# Patient Record
Sex: Female | Born: 1965 | Race: Black or African American | Hispanic: No
Health system: Southern US, Community
[De-identification: ages and names within clinical notes are randomized; demographics above are authoritative.]

## PROBLEM LIST (undated history)

## (undated) DIAGNOSIS — E049 Nontoxic goiter, unspecified: Secondary | ICD-10-CM

## (undated) DIAGNOSIS — F32A Depression, unspecified: Secondary | ICD-10-CM

## (undated) DIAGNOSIS — Z9221 Personal history of antineoplastic chemotherapy: Secondary | ICD-10-CM

## (undated) DIAGNOSIS — M1990 Unspecified osteoarthritis, unspecified site: Secondary | ICD-10-CM

## (undated) DIAGNOSIS — Z923 Personal history of irradiation: Secondary | ICD-10-CM

## (undated) DIAGNOSIS — Z803 Family history of malignant neoplasm of breast: Secondary | ICD-10-CM

## (undated) DIAGNOSIS — C50919 Malignant neoplasm of unspecified site of unspecified female breast: Secondary | ICD-10-CM

## (undated) HISTORY — DX: Nontoxic goiter, unspecified: E04.9

## (undated) HISTORY — DX: Depression, unspecified: F32.A

## (undated) HISTORY — DX: Family history of malignant neoplasm of breast: Z80.3

## (undated) HISTORY — PX: TOOTH EXTRACTION: SUR596

## (undated) HISTORY — PX: KNEE ARTHROSCOPY: SUR90

---

## 2006-12-19 HISTORY — PX: KNEE ARTHROSCOPY: SUR90

## 2008-12-19 HISTORY — PX: OTHER SURGICAL HISTORY: SHX169

## 2017-11-06 DIAGNOSIS — M170 Bilateral primary osteoarthritis of knee: Secondary | ICD-10-CM | POA: Insufficient documentation

## 2020-12-19 DIAGNOSIS — U071 COVID-19: Secondary | ICD-10-CM

## 2020-12-19 HISTORY — DX: COVID-19: U07.1

## 2021-04-07 DIAGNOSIS — C50912 Malignant neoplasm of unspecified site of left female breast: Secondary | ICD-10-CM

## 2021-04-07 HISTORY — DX: Malignant neoplasm of unspecified site of left female breast: C50.912

## 2021-04-07 HISTORY — PX: BREAST BIOPSY: SHX20

## 2021-04-20 ENCOUNTER — Other Ambulatory Visit: Payer: Self-pay | Admitting: General Surgery

## 2021-04-20 DIAGNOSIS — Z171 Estrogen receptor negative status [ER-]: Secondary | ICD-10-CM

## 2021-04-20 DIAGNOSIS — C50412 Malignant neoplasm of upper-outer quadrant of left female breast: Secondary | ICD-10-CM

## 2021-04-21 ENCOUNTER — Encounter: Payer: Self-pay | Admitting: Nurse Practitioner

## 2021-04-21 DIAGNOSIS — C50412 Malignant neoplasm of upper-outer quadrant of left female breast: Secondary | ICD-10-CM | POA: Insufficient documentation

## 2021-04-21 DIAGNOSIS — Z171 Estrogen receptor negative status [ER-]: Secondary | ICD-10-CM | POA: Insufficient documentation

## 2021-04-21 DIAGNOSIS — C50919 Malignant neoplasm of unspecified site of unspecified female breast: Secondary | ICD-10-CM | POA: Insufficient documentation

## 2021-04-21 NOTE — Progress Notes (Deleted)
one [place] NOTE  No care team member to display  CHIEF COMPLAINTS/PURPOSE OF CONSULTATION: Breast cancer  #  Oncology History   No history exists.     HISTORY OF PRESENTING ILLNESS:  Peggy Bates 55 y.o.  female female with no prior history of breast cancer/or malignancies has been referred to [place] for further evaluation recommendations for new diagnosis of breast cancer.   Patient states she was found to have an abnormal screening mammogram which led to diagnostic mammogram/ultrasound/followed by biopsy-as summarized above.  This a new patient is here today for: office visit. She is here for evaluation of newly diagnosis of left breast cancer referred by Angelina Ok NP.  Chest:  Breasts:  Right: Normal. No axillary adenopathy or supraclavicular adenopathy.  Left: Mass (2 cm palpable mass 1:30 o'clock, 9 cfn. Non-tender) present. No swelling, skin change, axillary adenopathy or supraclavicular adenopathy.   Musculoskeletal:  Cervical back: Neck supple.  Lymphadenopathy:  Upper Body:  Right upper body: No supraclavicular or axillary adenopathy.  Left upper body: No supraclavicular or axillary adenopathy.  Skin: General: Skin is warm and dry.  Neurological:  Mental Status: She is alert and oriented to person, place, and time.  Psychiatric:  Mood and Affect: Mood normal.  Behavior: Behavior normal.   Labs and Radiology:   Pathology dated April 07, 2021 from [facility] in [place]:  Ultrasound-guided core biopsy. Nuclear grade 3, high-grade. P53: 70% staining. Ki-67: 70% staining.  ER negative, PR negative, HER2 negative. Mitotic rate of 13 mitoses per high-power field.  The patient brought a copy of her imaging studies with her from [place] and these have been independently reviewed.  Screening mammogram dated March 30, 2021 showed an asymmetric nodular density in the superior lateral aspect of the left breast. The right breast was  unremarkable BI-RADS-0.  Ultrasound examination of the left breast showed a 1.1 cm round, nonparallel hypoechoic mass with angular margins. BI-RADS-4.  The biopsy was on 04-07-21 in [place]. She states she does self breast exams and could not feel anything different in the breast prior to her annual mammogram.  The patient had a close friend who recently went through a bout with an aggressive breast cancer. That individual was treated at [facility] and the patient was with her during most of her 2-year treatment course which ended in the friend's death. She is unfortunately very familiar with all the can go along with breast cancer including chemotherapy, access ports and hair loss.  The patient had 3 children, 1 died in adulthood and her sleep.  The patient does not smoke or drink.  She is here with her husband, Izell [place].    Family history of breast cancer:  Family history of other cancers:  Menarche: year old Menopause: years old Used OCP:  Used estrogen and progesterone therapy:  History of Radiation to the chest:  Number of pregnancies: Previous biopsy:   # I had a long discussion with the patient in general regarding the treatment options of breast cancer including-surgery; adjuvant radiation; role of adjuvant systemic therapy including-chemotherapy antihormone therapy. Patient will likely need lumpectomy with sentinel lymph node evaluation; followed by radiation. Decision regarding chemotherapy based on final surgical pathology/gene assay. Patient will benefit from antihormone therapy.  I discussed the potential benefits of each option; and also potential downsides in detail.    ROS   MEDICAL HISTORY:  No past medical history on file.  SURGICAL HISTORY: *** The histories are not reviewed yet. Please  review them in the "History" navigator section and refresh this [place].  SOCIAL HISTORY: Social History   Socioeconomic History  . Marital status: Unknown     Spouse name: Not on file  . Number of children: Not on file  . Years of education: Not on file  . Highest education level: Not on file  Occupational History  . Not on file  Tobacco Use  . Smoking status: Not on file  . Smokeless tobacco: Not on file  Substance and Sexual Activity  . Alcohol use: Not on file  . Drug use: Not on file  . Sexual activity: Not on file  Other Topics Concern  . Not on file  Social History Narrative  . Not on file   Social Determinants of Health   Financial Resource Strain: Not on file  Food Insecurity: Not on file  Transportation Needs: Not on file  Physical Activity: Not on file  Stress: Not on file  Social Connections: Not on file  Intimate Partner Violence: Not on file    FAMILY HISTORY: No family history on file.  ALLERGIES:  has no allergies on file.  MEDICATIONS:  No current outpatient medications on file.   No current facility-administered medications for this visit.      Marland Kitchen  PHYSICAL EXAMINATION: ECOG PERFORMANCE STATUS: {CHL ONC ECOG [phone]}  There were no vitals filed for this visit. There were no vitals filed for this visit.  Physical Exam   LABORATORY DATA:  I have reviewed the data as listed No results found for: WBC, HGB, HCT, MCV, PLT No results for input(s): NA, K, CL, CO2, GLUCOSE, BUN, CREATININE, CALCIUM, GFRNONAA, GFRAA, PROT, ALBUMIN, AST, ALT, ALKPHOS, BILITOT, BILIDIR, IBILI in the last 8760 hours.  RADIOGRAPHIC STUDIES: I have personally reviewed the radiological images as listed and agreed with the findings in the report. No results found.  ASSESSMENT & PLAN:   No problem-specific Assessment & Plan notes found for this encounter.    All questions were answered. The patient/family knows to call the clinic with any problems, questions or concerns.       Cammie Sickle, MD 04/21/2021 8:28 PM

## 2021-04-22 ENCOUNTER — Other Ambulatory Visit: Payer: Self-pay | Admitting: *Deleted

## 2021-04-22 ENCOUNTER — Ambulatory Visit: Payer: Self-pay | Admitting: Internal Medicine

## 2021-04-22 ENCOUNTER — Encounter: Payer: Self-pay | Admitting: *Deleted

## 2021-04-22 ENCOUNTER — Other Ambulatory Visit: Payer: Self-pay

## 2021-04-23 ENCOUNTER — Inpatient Hospital Stay: Attending: Internal Medicine | Admitting: Internal Medicine

## 2021-04-23 ENCOUNTER — Inpatient Hospital Stay

## 2021-04-23 DIAGNOSIS — C50412 Malignant neoplasm of upper-outer quadrant of left female breast: Secondary | ICD-10-CM

## 2021-04-23 DIAGNOSIS — Z171 Estrogen receptor negative status [ER-]: Secondary | ICD-10-CM | POA: Insufficient documentation

## 2021-04-23 DIAGNOSIS — Z803 Family history of malignant neoplasm of breast: Secondary | ICD-10-CM | POA: Insufficient documentation

## 2021-04-23 NOTE — Assessment & Plan Note (Deleted)
#    #    #   DISPOSITION: # labs today # MUGA scan # port placement- [email or web address] # chemo education [AC-Taxol-carbo] #

## 2021-04-27 ENCOUNTER — Telehealth: Payer: Self-pay | Admitting: Internal Medicine

## 2021-04-27 ENCOUNTER — Encounter: Payer: Self-pay | Admitting: *Deleted

## 2021-04-27 ENCOUNTER — Ambulatory Visit: Payer: Self-pay | Admitting: Internal Medicine

## 2021-04-27 ENCOUNTER — Other Ambulatory Visit: Payer: Self-pay

## 2021-04-27 DIAGNOSIS — E559 Vitamin D deficiency, unspecified: Secondary | ICD-10-CM | POA: Insufficient documentation

## 2021-04-27 DIAGNOSIS — F329 Major depressive disorder, single episode, unspecified: Secondary | ICD-10-CM | POA: Insufficient documentation

## 2021-04-27 DIAGNOSIS — Z6832 Body mass index (BMI) 32.0-32.9, adult: Secondary | ICD-10-CM | POA: Insufficient documentation

## 2021-04-27 DIAGNOSIS — E049 Nontoxic goiter, unspecified: Secondary | ICD-10-CM | POA: Insufficient documentation

## 2021-04-27 DIAGNOSIS — F419 Anxiety disorder, unspecified: Secondary | ICD-10-CM | POA: Insufficient documentation

## 2021-04-27 NOTE — Telephone Encounter (Signed)
Patient referred for consideration of neoadjuvant chemotherapy. Patient missed her appointment on 05/09.  Patient will be scheduled with other providers.  Discussed with Dr. Bary Castilla.  GB

## 2021-04-29 ENCOUNTER — Telehealth: Payer: Self-pay

## 2021-04-29 ENCOUNTER — Inpatient Hospital Stay (HOSPITAL_BASED_OUTPATIENT_CLINIC_OR_DEPARTMENT_OTHER): Admitting: Oncology

## 2021-04-29 ENCOUNTER — Inpatient Hospital Stay

## 2021-04-29 ENCOUNTER — Encounter: Payer: Self-pay | Admitting: Oncology

## 2021-04-29 VITALS — BP 126/87 | HR 78 | Temp 98.2°F | Resp 18 | Ht 67.0 in | Wt 207.4 lb

## 2021-04-29 DIAGNOSIS — Z803 Family history of malignant neoplasm of breast: Secondary | ICD-10-CM

## 2021-04-29 DIAGNOSIS — C50412 Malignant neoplasm of upper-outer quadrant of left female breast: Secondary | ICD-10-CM

## 2021-04-29 DIAGNOSIS — Z7189 Other specified counseling: Secondary | ICD-10-CM | POA: Insufficient documentation

## 2021-04-29 DIAGNOSIS — Z171 Estrogen receptor negative status [ER-]: Secondary | ICD-10-CM

## 2021-04-29 LAB — CBC WITH DIFFERENTIAL/PLATELET
Abs Immature Granulocytes: 0.01 10*3/uL (ref 0.00–0.07)
Basophils Absolute: 0 10*3/uL (ref 0.0–0.1)
Basophils Relative: 0 %
Eosinophils Absolute: 0 10*3/uL (ref 0.0–0.5)
Eosinophils Relative: 1 %
HCT: 39.6 % (ref 36.0–46.0)
Hemoglobin: 12.7 g/dL (ref 12.0–15.0)
Immature Granulocytes: 0 %
Lymphocytes Relative: 44 %
Lymphs Abs: 1.5 10*3/uL (ref 0.7–4.0)
MCH: 27.7 pg (ref 26.0–34.0)
MCHC: 32.1 g/dL (ref 30.0–36.0)
MCV: 86.3 fL (ref 80.0–100.0)
Monocytes Absolute: 0.3 10*3/uL (ref 0.1–1.0)
Monocytes Relative: 10 %
Neutro Abs: 1.5 10*3/uL — ABNORMAL LOW (ref 1.7–7.7)
Neutrophils Relative %: 45 %
Platelets: 237 10*3/uL (ref 150–400)
RBC: 4.59 MIL/uL (ref 3.87–5.11)
RDW: 13.8 % (ref 11.5–15.5)
Smear Review: NORMAL
WBC: 3.4 10*3/uL — ABNORMAL LOW (ref 4.0–10.5)
nRBC: 0 % (ref 0.0–0.2)

## 2021-04-29 LAB — COMPREHENSIVE METABOLIC PANEL
ALT: 39 U/L (ref 0–44)
AST: 34 U/L (ref 15–41)
Albumin: 4.6 g/dL (ref 3.5–5.0)
Alkaline Phosphatase: 73 U/L (ref 38–126)
Anion gap: 5 (ref 5–15)
BUN: 18 mg/dL (ref 6–20)
CO2: 30 mmol/L (ref 22–32)
Calcium: 9.6 mg/dL (ref 8.9–10.3)
Chloride: 104 mmol/L (ref 98–111)
Creatinine, Ser: 0.69 mg/dL (ref 0.44–1.00)
GFR, Estimated: >60 mL/min (ref >60)
Glucose, Bld: 96 mg/dL (ref 70–99)
Potassium: 4.8 mmol/L (ref 3.5–5.1)
Sodium: 139 mmol/L (ref 135–145)
Total Bilirubin: 0.7 mg/dL (ref 0.3–1.2)
Total Protein: 8 g/dL (ref 6.5–8.1)

## 2021-04-29 MED ORDER — LIDOCAINE-PRILOCAINE 2.5-2.5 % EX CREA: TOPICAL_CREAM | CUTANEOUS | 3 refills | Status: DC

## 2021-04-29 MED ORDER — ONDANSETRON HCL 8 MG PO TABS: 8.0000 mg | ORAL_TABLET | Freq: Two times a day (BID) | ORAL | 1 refills | Status: DC | PRN

## 2021-04-29 MED ORDER — PROCHLORPERAZINE MALEATE 10 MG PO TABS: 10.0000 mg | ORAL_TABLET | Freq: Four times a day (QID) | ORAL | 1 refills | Status: DC | PRN

## 2021-04-29 MED ORDER — DEXAMETHASONE 4 MG PO TABS: 8.0000 mg | ORAL_TABLET | Freq: Every day | ORAL | 1 refills | Status: DC

## 2021-04-29 NOTE — Telephone Encounter (Signed)
Request for path slides has been faxed to [facility] pathology,  on breast biopsy specimen ID: [id number].   Slide consult order entered.   [place] phone: [phone] fax: [phone]

## 2021-04-29 NOTE — Progress Notes (Signed)
Patient here to establish care  

## 2021-04-29 NOTE — Progress Notes (Signed)
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  female with PMH listed below was seen in consultation at the request of  Byrnett, Forest Gleason, MD  for evaluation of triple negative breast cancer.  03/30/2021, screening mammogram with 3D 2 nodular areas of asymmetric density demonstrated within the superior lateral aspect of the left breast middle third depth.  Finding was best appreciated on 3D tomosynthesis imaging. Targeted ultrasound showed left upper outer breast 1:30 position 1.1 cm round hypoechoic mass with angular margins. 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 Bary Castilla yesterday.  She presents to establish care with me today Patient did not notice this left a mass prior to the mammogram.   Family history of breast cancer: Breast cancer in her sister, her late 83s, early 58s; and being paternal grandmother Menarche: 25 Age at first live childbirth: 63  patient has 2 sons and 1 daughter.  Daughter has passed away Used OCP:  Used estrogen and progesterone therapy: Denies History of Radiation to the chest: Denies Previous of breast biopsy: Denies   Review of Systems  Constitutional: Negative for appetite change, chills, fatigue and fever.  HENT:   Negative for hearing loss and voice change.   Eyes: Negative for eye problems.  Respiratory: Negative for chest tightness and cough.   Cardiovascular: Negative for chest pain.  Gastrointestinal: Negative for abdominal distention, abdominal pain and blood in stool.   Endocrine: Negative for hot flashes.  Genitourinary: Negative for difficulty urinating and frequency.   Musculoskeletal: Negative for arthralgias.  Skin: Negative for itching and rash.  Neurological: Negative for extremity weakness.  Hematological: Negative for adenopathy.  Psychiatric/Behavioral: Negative for confusion.    MEDICAL HISTORY:  Past Medical History:  Diagnosis Date  . Depression   . Goiter   . Malignant neoplasm of left female breast ([place]) 04/07/2021    SURGICAL HISTORY: Past Surgical History:  Procedure Laterality Date  . KNEE ARTHROSCOPY    . TOOTH EXTRACTION    . TRANSCERVICAL UTERINE FIBROID(S) ABLATION  2010    SOCIAL HISTORY: Social History   Socioeconomic History  . Marital status: Married    Spouse name: Not on file  . Number of children: Not on file  . Years of education: Not on file  . Highest education level: Not on file  Occupational History  . Not on file  Tobacco Use  . Smoking status: Never Smoker  . Smokeless tobacco: Never Used  Vaping Use  . Vaping Use: Never used  Substance and Sexual Activity  . Alcohol use: Never  . Drug use: Never  . Sexual activity: Yes  Other Topics Concern  . Not on file  Social History Narrative  . Not on file   Social Determinants of Health   Financial Resource Strain: Not on file  Food Insecurity: Not on file  Transportation Needs: Not on file  Physical Activity: Not on file  Stress: Not on file  Social Connections: Not on file  Intimate Partner Violence: Not on file    FAMILY HISTORY: Family History  Problem Relation Age of Onset  .  Breast cancer Paternal Grandmother   . Breast cancer Sister   . Diabetes Sister   . Diabetes Father     ALLERGIES:  has no allergies on file.  MEDICATIONS:  Current Outpatient Medications  Medication Sig Dispense Refill  . cetirizine (ZYRTEC) 10 MG tablet Take 1 tablet by mouth daily.    . cholecalciferol (VITAMIN D) 25 MCG (1000 UNIT) tablet Take  1,000 Units by mouth daily.    . diclofenac Sodium (VOLTAREN) 1 % GEL Apply 1 application topically 3 (three) times daily.    . Flaxseed, Linseed, (FLAX SEED OIL) 1000 MG CAPS See admin instructions.    . gabapentin (NEURONTIN) 600 MG tablet Take 1 tablet by mouth daily.    . meloxicam (MOBIC) 15 MG tablet Take 1 tablet by mouth daily.    . Multiple Vitamins-Calcium (ONE-A-DAY WOMENS FORMULA PO) Take by mouth.    . Omega 3-6-9 Fatty Acids (OMEGA 3-6-9 COMPLEX) CAPS See admin instructions.    . phentermine (ADIPEX-P) 37.5 MG tablet Take 37.5 mg by mouth daily.     No current facility-administered medications for this visit.     PHYSICAL EXAMINATION: ECOG PERFORMANCE STATUS: 0 - Asymptomatic Vitals:   04/29/21 1344  BP: 126/87  Pulse: 78  Resp: 18  Temp: 98.2 F (36.8 C)   Filed Weights   04/29/21 1344  Weight: 207 lb 6.4 oz (94.1 kg)    Physical Exam Constitutional:      General: She is not in acute distress. HENT:     Head: Normocephalic and atraumatic.  Eyes:     General: No scleral icterus. Cardiovascular:     Rate and Rhythm: Normal rate and regular rhythm.     Heart sounds: Normal heart sounds.  Pulmonary:     Effort: Pulmonary effort is normal. No respiratory distress.     Breath sounds: No wheezing.  Abdominal:     General: Bowel sounds are normal. There is no distension.     Palpations: Abdomen is soft.  Musculoskeletal:        General: No deformity. Normal range of motion.     Cervical back: Normal range of motion and neck supple.  Skin:    General: Skin is warm and dry.     Findings: No erythema or rash.  Neurological:     Mental Status: She is alert and oriented to person, place, and time. Mental status is at baseline.     Cranial Nerves: No cranial nerve deficit.     Coordination: Coordination normal.  Psychiatric:        Mood and Affect: Mood normal.    Breast exam was performed in seated and lying down position. Patient is status post left  breast biopsy, there is a palpable 2 cm mass upper outer quadrant  No palpable mass in right breast.  No palpable lymphadenopathy bilaterally.  LABORATORY DATA:  I have reviewed the data as listed Lab Results  Component Value Date   WBC 3.4 (L) 04/29/2021   HGB 12.7 04/29/2021   HCT 39.6 04/29/2021   MCV 86.3 04/29/2021   PLT 237 04/29/2021   Recent Labs    04/29/21 1504  NA 139  K 4.8  CL 104  CO2 30  GLUCOSE 96  BUN 18  CREATININE 0.69  CALCIUM 9.6  GFRNONAA >60  PROT 8.0  ALBUMIN 4.6  AST 34  ALT 39  ALKPHOS 73  BILITOT 0.7   Iron/TIBC/Ferritin/ %Sat No results found for: IRON, TIBC, FERRITIN, IRONPCTSAT  RADIOGRAPHIC STUDIES: I have personally reviewed the radiological images as listed and agreed with the findings in the report. No results found.    ASSESSMENT & PLAN:  1. Carcinoma of upper-outer quadrant of left breast in female, estrogen receptor negative ([place])   2. Goals of care, counseling/discussion   3. Family history of breast cancer   .Cancer Staging Carcinoma of upper-outer quadrant of left breast in female, estrogen receptor negative ([place]) Staging form: Breast, AJCC 8th Edition - Clinical stage from 04/29/2021: Stage IB (cT1c, cN0, cM0, G3, ER-, PR-, HER2-) - Signed by Earlie Server, MD on 04/29/2021   Pathology reports and mammogram/ultrasound were done at outside facility and results were reviewed and discussed with patient and her husband.  Discussed about her diagnosis of triple negative breast cancer, cT1c N0 Left axillary lymph node is clinically negative.  LN status was not mentioned on her [place].  Need to obtain images and uploaded to EMR.  Radiology consultation. Obtain pathology slides for pathology consultation. -She needs genetic testing.  -Urgent genetic counselor referral. -I would offer her neoadjuvant chemotherapy with ddAC x 4  followed by weekly Taxol x 12. I explained to the patient the risks and benefits of chemotherapy ddAC followed  by Taxol including all but not limited to hair loss, mouth sore, nausea, vomiting, diarrhea, low blood counts, bleeding, out of the liver function, heart failure, neuropathy and risk of life threatening infection and even death, secondary malignancy etc.  . Patient voices understanding and willing to proceed chemotherapy.  #Obtain baseline echocardiogram. # Chemotherapy education; we will asked Dr. Bary Castilla to place [facility]- port . Antiemetics-Zofran and Compazine; EMLA cream sent to pharmacy  Check CBC, CMP, CA 15.3, CA 27.  29 Supportive care measures are necessary for patient well-being and will be provided as necessary. We spent sufficient time to discuss many aspect of care, questions were answered to patient's satisfaction.    Orders Placed This Encounter  Procedures  . CBC with Differential/Platelet    Standing Status:   Future    Number of Occurrences:   1    Standing Expiration Date:   04/29/2022  . Comprehensive metabolic panel    Standing Status:   Future    Number of Occurrences:   1    Standing Expiration Date:   04/29/2022  . Cancer antigen 27.29    Standing Status:   Future    Number of Occurrences:   1    Standing Expiration Date:   04/29/2022  . Cancer antigen 15-3    Standing Status:   Future    Number of Occurrences:   1    Standing Expiration Date:   04/29/2022  . Ambulatory referral to Genetics    Referral Priority:   Routine    Referral Type:   Consultation    Referral Reason:   Specialty Services Required    Referred to Provider:   Faith Rogue T    Number of Visits Requested:   1  . Ambulatory referral to General Surgery    Referral Priority:   Routine    Referral Type:   Surgical    Referral Reason:   Specialty Services Required    Referred to Provider:   Robert Bellow, MD    Requested Specialty:   General Surgery    Number of Visits Requested:   1  . ECHOCARDIOGRAM LIMITED    Standing Status:   Future    Standing Expiration Date:   04/29/2022  Order Specific Question:   Where should this test be performed    Answer:   [facility]    Order Specific Question:   Please indicate who you request to read the nuc med / echo results.    Answer:   [facility]    Order Specific Question:   Perflutren DEFINITY (image enhancing agent) should be administered unless hypersensitivity or allergy exist    Answer:   Administer Perflutren    Order Specific Question:   Reason for exam-Echo    Answer:   Chemo  Z09    Order Specific Question:   Other Comments    Answer:   needs baseline prior to starting chemo treatment    All questions were answered. The patient knows to call the clinic with any problems questions or concerns.  cc Robert Bellow, MD    Return of visit: hopefully to start chemotherapy in 2 weeks. Awaiting pathology slide review.  Thank you for this kind referral and the opportunity to participate in the care of this patient. A copy of today's note is routed to referring provider    Earlie Server, MD, [name] Hematology Oncology [facility] Pager- [phone] 04/29/2021

## 2021-04-29 NOTE — Progress Notes (Signed)
START ON PATHWAY REGIMEN - Breast     Cycles 1 through 4: A cycle is every 14 days:     Doxorubicin      Cyclophosphamide      Pegfilgrastim-xxxx    Cycles 5 through 16: A cycle is every 7 days:     Paclitaxel   **Always confirm dose/schedule in your pharmacy ordering system**  Patient Characteristics: Preoperative or Nonsurgical Candidate (Clinical Staging), Neoadjuvant Therapy followed by Surgery, Invasive Disease, Chemotherapy, HER2 Negative/Unknown/Equivocal, ER Negative/Unknown, Platinum Therapy Not Indicated Therapeutic Status: Preoperative or Nonsurgical Candidate (Clinical Staging) AJCC M Category: cM0 AJCC Grade: G3 Breast Surgical Plan: Neoadjuvant Therapy followed by Surgery ER Status: Negative (-) AJCC 8 Stage Grouping: IB HER2 Status: Negative (-) AJCC T Category: cT1c AJCC N Category: cN0 PR Status: Negative (-) Type of Therapy: Platinum Therapy Not Indicated Intent of Therapy: Curative Intent, Discussed with Patient 

## 2021-04-30 ENCOUNTER — Other Ambulatory Visit: Payer: Self-pay | Admitting: General Surgery

## 2021-04-30 LAB — CANCER ANTIGEN 15-3: CA 15-3: 5.1 U/mL (ref 0.0–25.0)

## 2021-04-30 LAB — CANCER ANTIGEN 27.29: CA 27.29: 10.5 U/mL (ref 0.0–38.6)

## 2021-04-30 NOTE — Progress Notes (Signed)
Subjective:     Patient ID: Peggy Bates is a 55 y.o. female.  HPI  The following portions of the patient's history were reviewed and updated as appropriate.  This a new patient is here today for: office visit. She is here for evaluation of newly diagnosis of left breast cancer referred by Angelina Ok NP. The biopsy was on 04-07-21 in [place]. She states she does self breast exams and could not feel anything different in the breast prior to her annual mammogram.  The patient had a close friend who recently went through a bout with an aggressive breast cancer.  That individual was treated at [facility] and the patient was with her during most of her 2-year treatment course which ended in the friend's death.  She is unfortunately very familiar with all the can go along with breast cancer including chemotherapy, access ports and hair loss.  The patient had 3 children, 1 died in adulthood and her sleep.  The patient does not smoke or drink.  She is here with her husband, Izell Rensselaer.  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  Review of Systems  Constitutional: Negative for chills and fever.  Respiratory: Negative for cough.        Chief Complaint  Patient presents with  . New Patient     BP 104/66   Pulse 83   Temp 36.7 C (98 F)   Ht 170.2 cm (5' 7")   Wt 91.2 kg (201 lb)   SpO2 98%   BMI 31.48 kg/m       Past Medical History:  Diagnosis Date  . Depression   . Goiter           Past Surgical History:  Procedure Laterality Date  . ARTHROSCOPY KNEE Right 2015  . TOOTH EXTRACTION    . TRANSCERVICAL UTERINE FIBROID(S) ABLATION  2010              OB History    Gravida  3   Para  3   Term      Preterm      AB      Living        SAB      IAB      Ectopic      Molar      Multiple      Live Births          Obstetric Comments  Age at first period 65 Age of first pregnancy 68          Social History         Socioeconomic History  . Marital status: Married  Tobacco Use  . Smoking status: Never Smoker  Substance and Sexual Activity  . Alcohol use: Never  . Drug use: Never       No Known Allergies  Current Medications        Current Outpatient Medications  Medication Sig Dispense Refill  . cetirizine (ZYRTEC) 10 MG tablet Take 10 mg by mouth once daily    . diclofenac (VOLTAREN) 1 % topical gel as directed    . gabapentin (NEURONTIN) 600 MG tablet once daily    . meloxicam (MOBIC) 15 MG tablet Take 15 mg by mouth once daily with food     No current facility-administered medications for this visit.           Family History  Problem Relation Age of Onset  . Diabetes Mother   .  Diabetes Sister   . Breast cancer Sister 9  . Breast cancer Paternal Grandmother   . Colon cancer Neg Hx          Objective:   Physical Exam Exam conducted with a chaperone present.  Constitutional:      Appearance: Normal appearance.  Cardiovascular:     Rate and Rhythm: Normal rate and regular rhythm.     Pulses: Normal pulses.     Heart sounds: Normal heart sounds.  Pulmonary:     Effort: Pulmonary effort is normal.     Breath sounds: Normal breath sounds.  Chest:  Breasts:     Right: Normal. No axillary adenopathy or supraclavicular adenopathy.     Left: Mass (2 cm palpable mass 1:30 o'clock, 9  cfn.  Non-tender) present. No swelling, skin change, axillary adenopathy or supraclavicular adenopathy.     Musculoskeletal:     Cervical back: Neck supple.  Lymphadenopathy:     Upper Body:     Right upper body: No supraclavicular or axillary adenopathy.     Left upper body: No supraclavicular or axillary adenopathy.  Skin:    General: Skin is warm and dry.  Neurological:     Mental Status: She is alert and oriented to person, place, and time.  Psychiatric:        Mood and Affect: Mood normal.        Behavior: Behavior  normal.     Labs and Radiology:   Pathology dated April 07, 2021 from [facility] in [place]:  Ultrasound-guided core biopsy.  Nuclear grade 3, high-grade.  P53: 70% staining.  Ki-67: 70% staining.  ER negative, PR negative, HER2 negative.  Mitotic rate of 13 mitoses per high-power field.  The patient brought a copy of her imaging studies with her from [place] and these have been independently reviewed.  Screening mammogram dated March 30, 2021 showed an asymmetric nodular density in the superior lateral aspect of the left breast.  The right breast was unremarkable BI-RADS-0.  Ultrasound examination of the left breast showed a 1.1 cm round, nonparallel hypoechoic mass with angular margins.  BI-RADS-4.      Assessment:     Two centimeter clinically palpable mass in the upper outer quadrant of the left breast, clinically node-negative.  Family history of breast cancer and a sister in her late 14s, early 60s.  No history of genetic testing available at this time.    Plan:     The patient and her husband were advised that the first step with a triple negative tumor is early involvement of medical oncology.  While the ultrasound suggested the size was just over a centimeter clinically this is a 2 cm mass.  She could have breast conservation surgery with or without neoadjuvant treatment, but considering the high Ki-67 value I think that neoadjuvant chemotherapy would be appropriate and possibly consideration of repeat biopsy after 1 or 2 cycles of treatment to confirm response.  Arrangements have been made and consult placed for the patient to be evaluated by Dr. Rogue Bussing at the [facility].  The patient is acceptable of this recommendation.  We discussed port placement if needed, although she has excellent peripheral access.  From her friend's course she is familiar with the port and its role.  Risks of the procedure, if port is needed was reviewed  including injury to the lung.  Over an hour was spent reviewing options for management and recommendations.  Further evaluation will take place after  evaluation by medical oncology.  The discs brought by the patient from Angel[facility] Sheriff will be taken to [facility] for entry into the hospital PACS system.     This note is partially prepared by Karie Fetch, RN, acting as a scribe in the presence of Dr. Hervey Ard, MD.  The documentation recorded by the scribe accurately reflects the service I personally performed and the decisions made by me.   Robert Bellow, MD FACS

## 2021-05-03 NOTE — Patient Instructions (Incomplete)
Doxorubicin injection What is this medicine? DOXORUBICIN (dox oh ROO bi sin) is a chemotherapy drug. It is used to treat many kinds of cancer like leukemia, lymphoma, neuroblastoma, sarcoma, and Wilms' tumor. It is also used to treat bladder cancer, breast cancer, lung cancer, ovarian cancer, stomach cancer, and thyroid cancer. This medicine may be used for other purposes; ask your health care provider or pharmacist if you have questions. COMMON BRAND NAME(S): Adriamycin, Adriamycin PFS, Adriamycin RDF, Rubex What should I tell my health care provider before I take this medicine? They need to know if you have any of these conditions:  heart disease  history of low blood counts caused by a medicine  liver disease  recent or ongoing radiation therapy  an unusual or allergic reaction to doxorubicin, other chemotherapy agents, other medicines, foods, dyes, or preservatives  pregnant or trying to get pregnant  breast-feeding How should I use this medicine? This drug is given as an infusion into a vein. It is administered in a hospital or clinic by a specially trained health care professional. If you have pain, swelling, burning or any unusual feeling around the site of your injection, tell your health care professional right away. Talk to your pediatrician regarding the use of this medicine in children. Special care may be needed. Overdosage: If you think you have taken too much of this medicine contact a poison control center or emergency room at once. NOTE: This medicine is only for you. Do not share this medicine with others. What if I miss a dose? It is important not to miss your dose. Call your doctor or health care professional if you are unable to keep an appointment. What may interact with this medicine? This medicine may interact with the following medications:  6-mercaptopurine  paclitaxel  phenytoin  St. John's Wort  trastuzumab  verapamil This list may not describe  all possible interactions. Give your health care provider a list of all the medicines, herbs, non-prescription drugs, or dietary supplements you use. Also tell them if you smoke, drink alcohol, or use illegal drugs. Some items may interact with your medicine. What should I watch for while using this medicine? This drug may make you feel generally unwell. This is not uncommon, as chemotherapy can affect healthy cells as well as cancer cells. Report any side effects. Continue your course of treatment even though you feel ill unless your doctor tells you to stop. There is a maximum amount of this medicine you should receive throughout your life. The amount depends on the medical condition being treated and your overall health. Your doctor will watch how much of this medicine you receive in your lifetime. Tell your doctor if you have taken this medicine before. You may need blood work done while you are taking this medicine. Your urine may turn red for a few days after your dose. This is not blood. If your urine is dark or brown, call your doctor. In some cases, you may be given additional medicines to help with side effects. Follow all directions for their use. Call your doctor or health care professional for advice if you get a fever, chills or sore throat, or other symptoms of a cold or flu. Do not treat yourself. This drug decreases your body's ability to fight infections. Try to avoid being around people who are sick. This medicine may increase your risk to bruise or bleed. Call your doctor or health care professional if you notice any unusual bleeding. Talk to your doctor   about your risk of cancer. You may be more at risk for certain types of cancers if you take this medicine. Do not become pregnant while taking this medicine or for 6 months after stopping it. Women should inform their doctor if they wish to become pregnant or think they might be pregnant. Men should not father a child while taking this  medicine and for 6 months after stopping it. There is a potential for serious side effects to an unborn child. Talk to your health care professional or pharmacist for more information. Do not breast-feed an infant while taking this medicine. This medicine has caused ovarian failure in some women and reduced sperm counts in some men This medicine may interfere with the ability to have a child. Talk with your doctor or health care professional if you are concerned about your fertility. This medicine may cause a decrease in Co-Enzyme Q-10. You should make sure that you get enough Co-Enzyme Q-10 while you are taking this medicine. Discuss the foods you eat and the vitamins you take with your health care professional. What side effects may I notice from receiving this medicine? Side effects that you should report to your doctor or health care professional as soon as possible:  allergic reactions like skin rash, itching or hives, swelling of the face, lips, or tongue  breathing problems  chest pain  fast or irregular heartbeat  low blood counts - this medicine may decrease the number of white blood cells, red blood cells and platelets. You may be at increased risk for infections and bleeding.  pain, redness, or irritation at site where injected  signs of infection - fever or chills, cough, sore throat, pain or difficulty passing urine  signs of decreased platelets or bleeding - bruising, pinpoint red spots on the skin, black, tarry stools, blood in the urine  swelling of the ankles, feet, hands  tiredness  weakness Side effects that usually do not require medical attention (report to your doctor or health care professional if they continue or are bothersome):  diarrhea  hair loss  mouth sores  nail discoloration or damage  nausea  red colored urine  vomiting This list may not describe all possible side effects. Call your doctor for medical advice about side effects. You may report  side effects to FDA at 1-800-FDA-1088. Where should I keep my medicine? This drug is given in a hospital or clinic and will not be stored at home. NOTE: This sheet is a summary. It may not cover all possible information. If you have questions about this medicine, talk to your doctor, pharmacist, or health care provider.  2021 Elsevier/Gold Standard (2017-07-19 11:01:26) Cyclophosphamide Injection What is this medicine? CYCLOPHOSPHAMIDE (sye kloe FOSS fa mide) is a chemotherapy drug. It slows the growth of cancer cells. This medicine is used to treat many types of cancer like lymphoma, myeloma, leukemia, breast cancer, and ovarian cancer, to name a few. This medicine may be used for other purposes; ask your health care provider or pharmacist if you have questions. COMMON BRAND NAME(S): Cytoxan, Neosar What should I tell my health care provider before I take this medicine? They need to know if you have any of these conditions:  heart disease  history of irregular heartbeat  infection  kidney disease  liver disease  low blood counts, like white cells, platelets, or red blood cells  on hemodialysis  recent or ongoing radiation therapy  scarring or thickening of the lungs  trouble passing urine  an  unusual or allergic reaction to cyclophosphamide, other medicines, foods, dyes, or preservatives  pregnant or trying to get pregnant  breast-feeding How should I use this medicine? This drug is usually given as an injection into a vein or muscle or by infusion into a vein. It is administered in a hospital or clinic by a specially trained health care professional. Talk to your pediatrician regarding the use of this medicine in children. Special care may be needed. Overdosage: If you think you have taken too much of this medicine contact a poison control center or emergency room at once. NOTE: This medicine is only for you. Do not share this medicine with others. What if I miss a  dose? It is important not to miss your dose. Call your doctor or health care professional if you are unable to keep an appointment. What may interact with this medicine?  amphotericin B  azathioprine  certain antivirals for HIV or hepatitis  certain medicines for blood pressure, heart disease, irregular heart beat  certain medicines that treat or prevent blood clots like warfarin  certain other medicines for cancer  cyclosporine  etanercept  indomethacin  medicines that relax muscles for surgery  medicines to increase blood counts  metronidazole This list may not describe all possible interactions. Give your health care provider a list of all the medicines, herbs, non-prescription drugs, or dietary supplements you use. Also tell them if you smoke, drink alcohol, or use illegal drugs. Some items may interact with your medicine. What should I watch for while using this medicine? Your condition will be monitored carefully while you are receiving this medicine. You may need blood work done while you are taking this medicine. Drink water or other fluids as directed. Urinate often, even at night. Some products may contain alcohol. Ask your health care professional if this medicine contains alcohol. Be sure to tell all health care professionals you are taking this medicine. Certain medicines, like metronidazole and disulfiram, can cause an unpleasant reaction when taken with alcohol. The reaction includes flushing, headache, nausea, vomiting, sweating, and increased thirst. The reaction can last from 30 minutes to several hours. Do not become pregnant while taking this medicine or for 1 year after stopping it. Women should inform their health care professional if they wish to become pregnant or think they might be pregnant. Men should not father a child while taking this medicine and for 4 months after stopping it. There is potential for serious side effects to an unborn child. Talk to your  health care professional for more information. Do not breast-feed an infant while taking this medicine or for 1 week after stopping it. This medicine has caused ovarian failure in some women. This medicine may make it more difficult to get pregnant. Talk to your health care professional if you are concerned about your fertility. This medicine has caused decreased sperm counts in some men. This may make it more difficult to father a child. Talk to your health care professional if you are concerned about your fertility. Call your health care professional for advice if you get a fever, chills, or sore throat, or other symptoms of a cold or flu. Do not treat yourself. This medicine decreases your body's ability to fight infections. Try to avoid being around people who are sick. Avoid taking medicines that contain aspirin, acetaminophen, ibuprofen, naproxen, or ketoprofen unless instructed by your health care professional. These medicines may hide a fever. Talk to your health care professional about your risk of cancer.   You may be more at risk for certain types of cancer if you take this medicine. If you are going to need surgery or other procedure, tell your health care professional that you are using this medicine. Be careful brushing or flossing your teeth or using a toothpick because you may get an infection or bleed more easily. If you have any dental work done, tell your dentist you are receiving this medicine. What side effects may I notice from receiving this medicine? Side effects that you should report to your doctor or health care professional as soon as possible:  allergic reactions like skin rash, itching or hives, swelling of the face, lips, or tongue  breathing problems  nausea, vomiting  signs and symptoms of bleeding such as bloody or black, tarry stools; red or dark brown urine; spitting up blood or brown material that looks like coffee grounds; red spots on the skin; unusual bruising  or bleeding from the eyes, gums, or nose  signs and symptoms of heart failure like fast, irregular heartbeat, sudden weight gain; swelling of the ankles, feet, hands  signs and symptoms of infection like fever; chills; cough; sore throat; pain or trouble passing urine  signs and symptoms of kidney injury like trouble passing urine or change in the amount of urine  signs and symptoms of liver injury like dark yellow or brown urine; general ill feeling or flu-like symptoms; light-colored stools; loss of appetite; nausea; right upper belly pain; unusually weak or tired; yellowing of the eyes or skin Side effects that usually do not require medical attention (report to your doctor or health care professional if they continue or are bothersome):  confusion  decreased hearing  diarrhea  facial flushing  hair loss  headache  loss of appetite  missed menstrual periods  signs and symptoms of low red blood cells or anemia such as unusually weak or tired; feeling faint or lightheaded; falls  skin discoloration This list may not describe all possible side effects. Call your doctor for medical advice about side effects. You may report side effects to FDA at 1-800-FDA-1088. Where should I keep my medicine? This drug is given in a hospital or clinic and will not be stored at home. NOTE: This sheet is a summary. It may not cover all possible information. If you have questions about this medicine, talk to your doctor, pharmacist, or health care provider.  2021 Elsevier/Gold Standard (2019-09-09 09:53:29) Pegfilgrastim injection What is this medicine? PEGFILGRASTIM (PEG fil gra stim) is a long-acting granulocyte colony-stimulating factor that stimulates the growth of neutrophils, a type of white blood cell important in the body's fight against infection. It is used to reduce the incidence of fever and infection in patients with certain types of cancer who are receiving chemotherapy that affects  the bone marrow, and to increase survival after being exposed to high doses of radiation. This medicine may be used for other purposes; ask your health care provider or pharmacist if you have questions. COMMON BRAND NAME(S): Rexene Edison, Ziextenzo What should I tell my health care provider before I take this medicine? They need to know if you have any of these conditions:  kidney disease  latex allergy  ongoing radiation therapy  sickle cell disease  skin reactions to acrylic adhesives (On-Body Injector only)  an unusual or allergic reaction to pegfilgrastim, filgrastim, other medicines, foods, dyes, or preservatives  pregnant or trying to get pregnant  breast-feeding How should I use this medicine? This medicine is for  injection under the skin. If you get this medicine at home, you will be taught how to prepare and give the pre-filled syringe or how to use the On-body Injector. Refer to the patient Instructions for Use for detailed instructions. Use exactly as directed. Tell your healthcare provider immediately if you suspect that the On-body Injector may not have performed as intended or if you suspect the use of the On-body Injector resulted in a missed or partial dose. It is important that you put your used needles and syringes in a special sharps container. Do not put them in a trash can. If you do not have a sharps container, call your pharmacist or healthcare provider to get one. Talk to your pediatrician regarding the use of this medicine in children. While this drug may be prescribed for selected conditions, precautions do apply. Overdosage: If you think you have taken too much of this medicine contact a poison control center or emergency room at once. NOTE: This medicine is only for you. Do not share this medicine with others. What if I miss a dose? It is important not to miss your dose. Call your doctor or health care professional if you miss your  dose. If you miss a dose due to an On-body Injector failure or leakage, a new dose should be administered as soon as possible using a single prefilled syringe for manual use. What may interact with this medicine? Interactions have not been studied. This list may not describe all possible interactions. Give your health care provider a list of all the medicines, herbs, non-prescription drugs, or dietary supplements you use. Also tell them if you smoke, drink alcohol, or use illegal drugs. Some items may interact with your medicine. What should I watch for while using this medicine? Your condition will be monitored carefully while you are receiving this medicine. You may need blood work done while you are taking this medicine. Talk to your health care provider about your risk of cancer. You may be more at risk for certain types of cancer if you take this medicine. If you are going to need a MRI, CT scan, or other procedure, tell your doctor that you are using this medicine (On-Body Injector only). What side effects may I notice from receiving this medicine? Side effects that you should report to your doctor or health care professional as soon as possible:  allergic reactions (skin rash, itching or hives, swelling of the face, lips, or tongue)  back pain  dizziness  fever  pain, redness, or irritation at site where injected  pinpoint red spots on the skin  red or dark-brown urine  shortness of breath or breathing problems  stomach or side pain, or pain at the shoulder  swelling  tiredness  trouble passing urine or change in the amount of urine  unusual bruising or bleeding Side effects that usually do not require medical attention (report to your doctor or health care professional if they continue or are bothersome):  bone pain  muscle pain This list may not describe all possible side effects. Call your doctor for medical advice about side effects. You may report side effects to  FDA at 1-800-FDA-1088. Where should I keep my medicine? Keep out of the reach of children. If you are using this medicine at home, you will be instructed on how to store it. Throw away any unused medicine after the expiration date on the label. NOTE: This sheet is a summary. It may not cover all possible information. If  you have questions about this medicine, talk to your doctor, pharmacist, or health care provider.  2021 Elsevier/Gold Standard (2019-12-27 13:20:51)

## 2021-05-03 NOTE — Telephone Encounter (Addendum)
Spoke to a path rep at [facility] path, who said that slides will be sent out tomorrow (5/17)

## 2021-05-04 ENCOUNTER — Inpatient Hospital Stay

## 2021-05-05 ENCOUNTER — Encounter
Admission: RE | Admit: 2021-05-05 | Discharge: 2021-05-05 | Disposition: A | Discharge status: Home / Self Care | Source: Ambulatory Visit | Attending: General Surgery | Admitting: General Surgery

## 2021-05-05 ENCOUNTER — Other Ambulatory Visit: Payer: Self-pay

## 2021-05-05 ENCOUNTER — Encounter: Payer: Self-pay | Admitting: *Deleted

## 2021-05-05 HISTORY — DX: Unspecified osteoarthritis, unspecified site: M19.90

## 2021-05-05 NOTE — Patient Instructions (Addendum)
Your procedure is scheduled on:05-07-21 FRIDAY Report to the Registration Desk on the 1st floor of the [facility]-Then proceed to the 2nd floor Surgery Desk in the [facility] To find out your arrival time, please call [phone] between 1PM - 3PM on:05-06-21 THURSDAY  REMEMBER: Instructions that are not followed completely may result in serious medical risk, up to and including death; or upon the discretion of your surgeon and anesthesiologist your surgery may need to be rescheduled.  Do not eat food after midnight the night before surgery.  No gum chewing, lozengers or hard candies.  You may however, drink CLEAR liquids up to 2 hours before you are scheduled to arrive for your surgery. Do not drink anything within 2 hours of your scheduled arrival time.  Clear liquids include: - water  - apple juice without pulp - gatorade - black coffee or tea (Do NOT add milk or creamers to the coffee or tea) Do NOT drink anything that is not on this list.  TAKE THESE MEDICATIONS THE MORNING OF SURGERY WITH A SIP OF WATER: -GABAPENTIN (NEURONTIN)  One week prior to surgery: Stop Anti-inflammatories (NSAIDS) such as MOBIC (MELOXICAM),Advil, Aleve, Ibuprofen, Motrin, Naproxen, Naprosyn and Aspirin based products such as Excedrin, Goodys Powder, BC Powder-OK TO TAKE TYLENOL IF NEEDED  Stop ANY OVER THE COUNTER supplements/vitamins NOW 05-05-21 until after surgery  No Alcohol for 24 hours before or after surgery.  No Smoking including e-cigarettes for 24 hours prior to surgery.  No chewable tobacco products for at least 6 hours prior to surgery.  No nicotine patches on the day of surgery.  Do not use any "recreational" drugs for at least a week prior to your surgery.  Please be advised that the combination of cocaine and anesthesia may have negative outcomes, up to and including death. If you test positive for cocaine, your surgery will be cancelled.  On the morning of surgery brush your  teeth with toothpaste and water, you may rinse your mouth with mouthwash if you wish. Do not swallow any toothpaste or mouthwash.  Do not wear jewelry, make-up, hairpins, clips or nail polish.  Do not wear lotions, powders, or perfumes.   Do not shave body from the neck down 48 hours prior to surgery just in case you cut yourself which could leave a site for infection.  Also, freshly shaved skin may become irritated if using the CHG soap.  Contact lenses, hearing aids and dentures may not be worn into surgery.  Do not bring valuables to the hospital. [facility] is not responsible for any missing/lost belongings or valuables.   Notify your doctor if there is any change in your medical condition (cold, fever, infection).  Wear comfortable clothing (specific to your surgery type) to the hospital.  Plan for stool softeners for home use; pain medications have a tendency to cause constipation. You can also help prevent constipation by eating foods high in fiber such as fruits and vegetables and drinking plenty of fluids as your diet allows.  After surgery, you can help prevent lung complications by doing breathing exercises.  Take deep breaths and cough every 1-2 hours. Your doctor may order a device called an Incentive Spirometer to help you take deep breaths. When coughing or sneezing, hold a pillow firmly against your incision with both hands. This is called "splinting." Doing this helps protect your incision. It also decreases belly discomfort.  If you are being admitted to the hospital overnight, leave your suitcase in the car.  After surgery it may be brought to your room.  If you are being discharged the day of surgery, you will not be allowed to drive home. You will need a responsible adult (18 years or older) to drive you home and stay with you that night.   If you are taking public transportation, you will need to have a responsible adult (18 years or older) with you. Please  confirm with your physician that it is acceptable to use public transportation.   Please call the [facility] at [phone] if you have any questions about these instructions.  Surgery Visitation Policy:  Patients undergoing a surgery or procedure may have one family member or support person with them as long as that person is not COVID-19 positive or experiencing its symptoms.  That person may remain in the waiting area during the procedure.  Inpatient Visitation:    Visiting hours are 7 a.m. to 8 p.m. Inpatients will be allowed two visitors daily. The visitors may change each day during the patient's stay. No visitors under the age of 59. Any visitor under the age of 10 must be accompanied by an adult. The visitor must pass COVID-19 screenings, use hand sanitizer when entering and exiting the patient's room and wear a mask at all times, including in the patient's room. Patients must also wear a mask when staff or their visitor are in the room. Masking is required regardless of vaccination status.

## 2021-05-07 ENCOUNTER — Encounter
Admission: RE | Disposition: A | Discharge status: Home / Self Care | Payer: Self-pay | Source: Home / Self Care | Attending: General Surgery

## 2021-05-07 ENCOUNTER — Ambulatory Visit
Admission: RE | Admit: 2021-05-07 | Discharge: 2021-05-07 | Disposition: A | Discharge status: Home / Self Care | Attending: General Surgery | Admitting: General Surgery

## 2021-05-07 ENCOUNTER — Ambulatory Visit: Admitting: Certified Registered Nurse Anesthetist

## 2021-05-07 ENCOUNTER — Ambulatory Visit

## 2021-05-07 ENCOUNTER — Encounter: Payer: Self-pay | Admitting: General Surgery

## 2021-05-07 DIAGNOSIS — Z791 Long term (current) use of non-steroidal anti-inflammatories (NSAID): Secondary | ICD-10-CM | POA: Insufficient documentation

## 2021-05-07 DIAGNOSIS — Z171 Estrogen receptor negative status [ER-]: Secondary | ICD-10-CM | POA: Insufficient documentation

## 2021-05-07 DIAGNOSIS — Z79899 Other long term (current) drug therapy: Secondary | ICD-10-CM | POA: Insufficient documentation

## 2021-05-07 DIAGNOSIS — C50912 Malignant neoplasm of unspecified site of left female breast: Secondary | ICD-10-CM | POA: Insufficient documentation

## 2021-05-07 DIAGNOSIS — Z793 Long term (current) use of hormonal contraceptives: Secondary | ICD-10-CM | POA: Insufficient documentation

## 2021-05-07 DIAGNOSIS — Z95828 Presence of other vascular implants and grafts: Secondary | ICD-10-CM

## 2021-05-07 DIAGNOSIS — Z8616 Personal history of COVID-19: Secondary | ICD-10-CM | POA: Insufficient documentation

## 2021-05-07 HISTORY — PX: PORTACATH PLACEMENT: SHX2246

## 2021-05-07 IMAGING — DX DG CHEST 1V PORT
1 series · 1 of 1 positions shown · non-contrast
Comparison: None.

CLINICAL DATA: Port insertion

EXAM:
PORTABLE CHEST 1 VIEW

[chest ap]
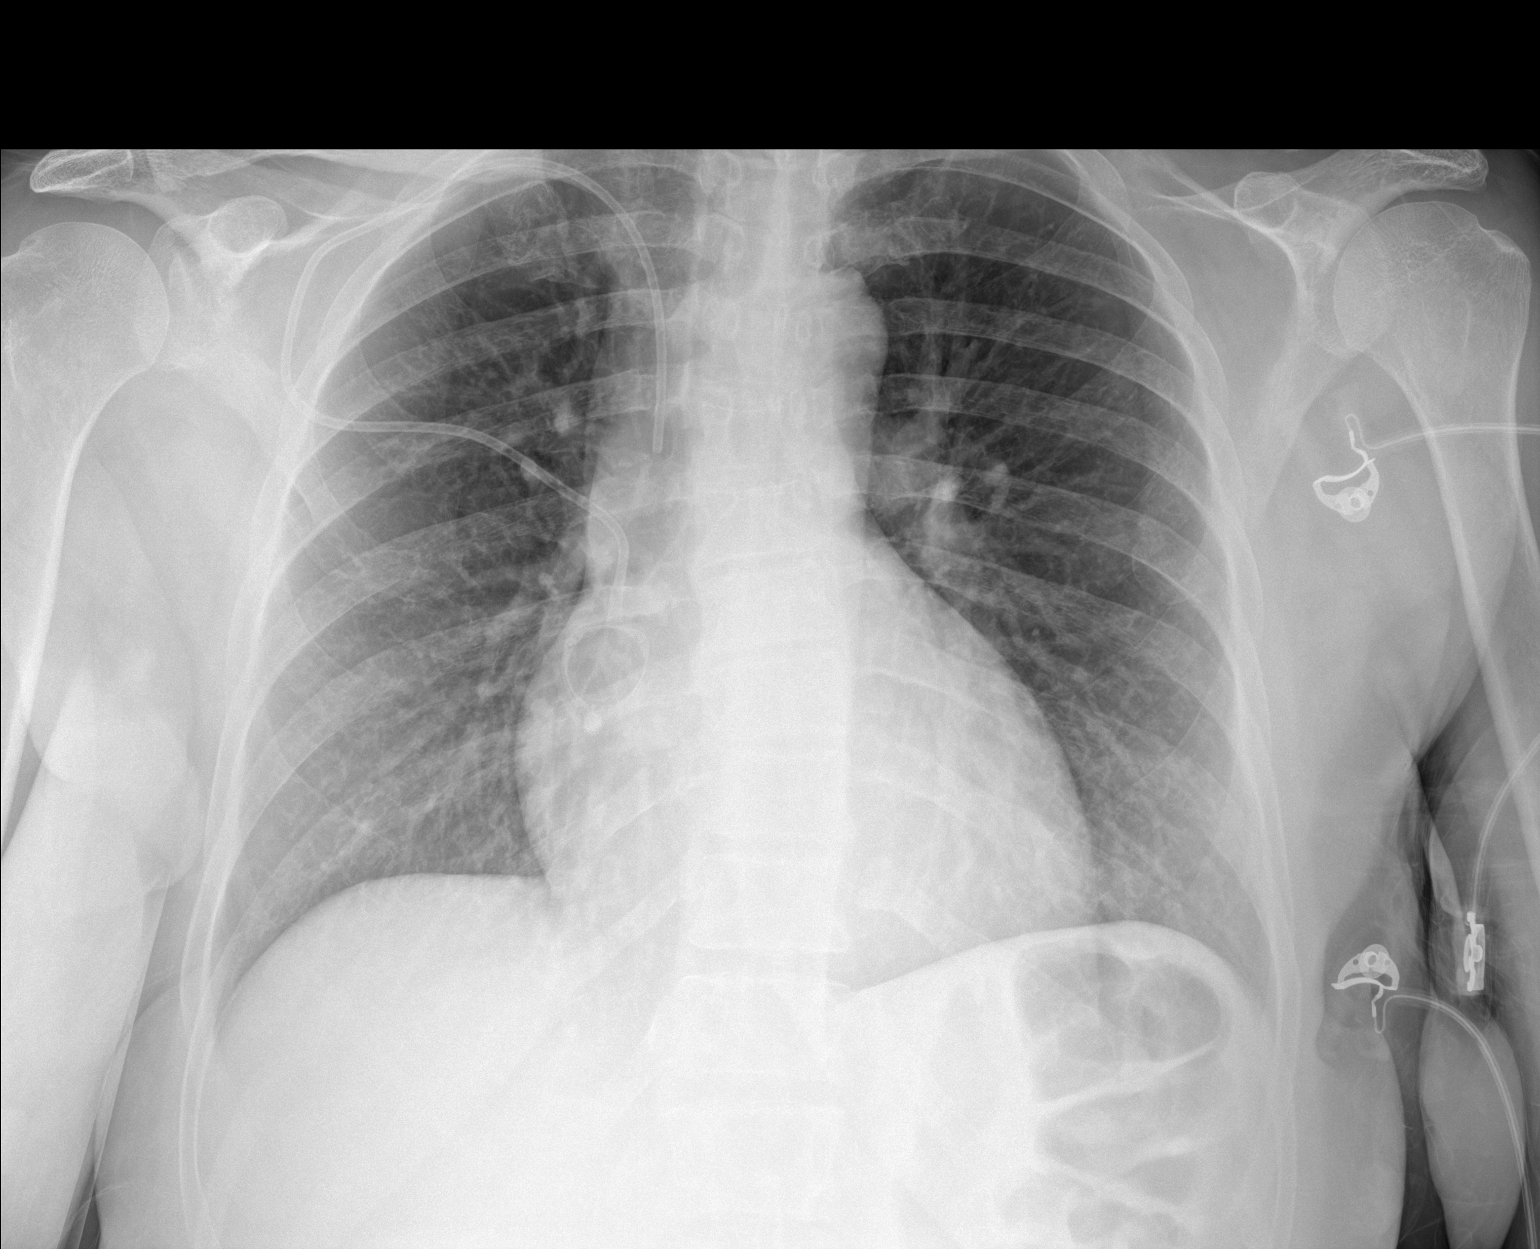

[1 of 1 positions shown; findings below may reference images not displayed]

FINDINGS: Cardiomediastinal silhouette and pulmonary vasculature are within
normal limits. Lungs clear. No pneumothorax.

Right subclavian chest port terminates in the region of the right
brachiocephalic vein/upper superior vena cava.

Asymmetric widening of the left AC joint consistent with separation
of uncertain chronicity.
IMPRESSION: Right-sided chest port terminates in the region of the right
brachiocephalic vein/upper superior vena cava.

## 2021-05-07 SURGERY — INSERTION, TUNNELED CENTRAL VENOUS DEVICE, WITH PORT
Anesthesia: General | Site: Chest | Laterality: Right

## 2021-05-07 MED ORDER — ORAL CARE MOUTH RINSE: 15.0000 mL | Freq: Once | OROMUCOSAL | Status: AC

## 2021-05-07 MED ORDER — FAMOTIDINE 20 MG PO TABS
ORAL_TABLET | ORAL | Status: AC
  Administered 2021-05-07: 20 mg via ORAL
  Filled 2021-05-07: qty 1

## 2021-05-07 MED ORDER — FENTANYL CITRATE (PF) 100 MCG/2ML IJ SOLN: 25.0000 ug | INTRAMUSCULAR | Status: DC | PRN

## 2021-05-07 MED ORDER — CHLORHEXIDINE GLUCONATE CLOTH 2 % EX PADS: 6.0000 | MEDICATED_PAD | Freq: Once | CUTANEOUS | Status: DC

## 2021-05-07 MED ORDER — CEFAZOLIN SODIUM-DEXTROSE 2-4 GM/100ML-% IV SOLN
INTRAVENOUS | Status: AC
  Filled 2021-05-07: qty 100

## 2021-05-07 MED ORDER — LIDOCAINE HCL 1 % IJ SOLN
INTRAMUSCULAR | Status: DC | PRN
  Administered 2021-05-07: 10 mL

## 2021-05-07 MED ORDER — SODIUM CHLORIDE (PF) 0.9 % IJ SOLN
INTRAMUSCULAR | Status: DC | PRN
  Administered 2021-05-07: 10 mL via INTRAVENOUS

## 2021-05-07 MED ORDER — LACTATED RINGERS IV SOLN: INTRAVENOUS | Status: DC

## 2021-05-07 MED ORDER — OXYCODONE HCL 5 MG PO TABS: 5.0000 mg | ORAL_TABLET | Freq: Once | ORAL | Status: DC | PRN

## 2021-05-07 MED ORDER — LIDOCAINE HCL (PF) 1 % IJ SOLN
INTRAMUSCULAR | Status: AC
  Filled 2021-05-07: qty 30

## 2021-05-07 MED ORDER — OXYCODONE HCL 5 MG/5ML PO SOLN: 5.0000 mg | Freq: Once | ORAL | Status: DC | PRN

## 2021-05-07 MED ORDER — FAMOTIDINE 20 MG PO TABS: 20.0000 mg | ORAL_TABLET | Freq: Once | ORAL | Status: AC

## 2021-05-07 MED ORDER — PROPOFOL 500 MG/50ML IV EMUL
INTRAVENOUS | Status: DC | PRN
  Administered 2021-05-07: 100 ug/kg/min via INTRAVENOUS

## 2021-05-07 MED ORDER — CEFAZOLIN SODIUM-DEXTROSE 2-4 GM/100ML-% IV SOLN
2.0000 g | INTRAVENOUS | Status: AC
  Administered 2021-05-07: 2 g via INTRAVENOUS

## 2021-05-07 MED ORDER — SODIUM CHLORIDE (PF) 0.9 % IJ SOLN
INTRAMUSCULAR | Status: AC
  Filled 2021-05-07: qty 50

## 2021-05-07 MED ORDER — CHLORHEXIDINE GLUCONATE CLOTH 2 % EX PADS
6.0000 | MEDICATED_PAD | Freq: Once | CUTANEOUS | Status: AC
  Administered 2021-05-07: 6 via TOPICAL

## 2021-05-07 MED ORDER — FENTANYL CITRATE (PF) 100 MCG/2ML IJ SOLN
INTRAMUSCULAR | Status: DC | PRN
  Administered 2021-05-07 (×2): 50 ug via INTRAVENOUS

## 2021-05-07 MED ORDER — CHLORHEXIDINE GLUCONATE 0.12 % MT SOLN: 15.0000 mL | Freq: Once | OROMUCOSAL | Status: AC

## 2021-05-07 MED ORDER — MIDAZOLAM HCL 2 MG/2ML IJ SOLN
INTRAMUSCULAR | Status: DC | PRN
  Administered 2021-05-07: 2 mg via INTRAVENOUS

## 2021-05-07 MED ORDER — CHLORHEXIDINE GLUCONATE 0.12 % MT SOLN
OROMUCOSAL | Status: AC
  Administered 2021-05-07: 15 mL via OROMUCOSAL
  Filled 2021-05-07: qty 15

## 2021-05-07 SURGICAL SUPPLY — 36 items
APL PRP STRL LF DISP 70% ISPRP (MISCELLANEOUS) ×1
APL SKNCLS STERI-STRIP NONHPOA (GAUZE/BANDAGES/DRESSINGS) ×1
BAG DECANTER FOR FLEXI CONT (MISCELLANEOUS) IMPLANT
BENZOIN TINCTURE PRP APPL 2/3 (GAUZE/BANDAGES/DRESSINGS) ×2 IMPLANT
BLADE SURG 15 STRL SS SAFETY (BLADE) ×2 IMPLANT
CHLORAPREP W/TINT 26 (MISCELLANEOUS) ×2 IMPLANT
COVER LIGHT HANDLE STERIS (MISCELLANEOUS) ×4 IMPLANT
COVER WAND RF STERILE (DRAPES) IMPLANT
DECANTER SPIKE VIAL GLASS SM (MISCELLANEOUS) ×4 IMPLANT
DRAPE C-ARM XRAY 36X54 (DRAPES) ×2 IMPLANT
DRAPE LAPAROTOMY TRNSV 106X77 (MISCELLANEOUS) ×2 IMPLANT
DRSG TEGADERM 2-3/8X2-3/4 SM (GAUZE/BANDAGES/DRESSINGS) ×2 IMPLANT
DRSG TEGADERM 4X4.75 (GAUZE/BANDAGES/DRESSINGS) ×2 IMPLANT
DRSG TELFA 4X3 1S NADH ST (GAUZE/BANDAGES/DRESSINGS) ×2 IMPLANT
ELECT REM PT RETURN 9FT ADLT (ELECTROSURGICAL) ×2
ELECTRODE REM PT RTRN 9FT ADLT (ELECTROSURGICAL) ×1 IMPLANT
GLOVE SRG 8 PF TXTR STRL LF DI (GLOVE) ×1 IMPLANT
GLOVE SURG ENC MOIS LTX SZ7.5 (GLOVE) ×2 IMPLANT
GLOVE SURG UNDER POLY LF SZ8 (GLOVE) ×2
GOWN STRL REUS W/ TWL LRG LVL3 (GOWN DISPOSABLE) ×2 IMPLANT
GOWN STRL REUS W/TWL LRG LVL3 (GOWN DISPOSABLE) ×4
IV NS 500ML (IV SOLUTION) ×2
IV NS 500ML BAXH (IV SOLUTION) ×1 IMPLANT
KIT PORT POWER 8FR ISP CVUE (Port) ×2 IMPLANT
KIT TURNOVER KIT A (KITS) ×2 IMPLANT
LABEL OR SOLS (LABEL) ×2 IMPLANT
MANIFOLD NEPTUNE II (INSTRUMENTS) ×2 IMPLANT
PACK PORT-A-CATH (MISCELLANEOUS) ×2 IMPLANT
STRIP CLOSURE SKIN 1/2X4 (GAUZE/BANDAGES/DRESSINGS) ×2 IMPLANT
SUT PROLENE 3 0 SH DA (SUTURE) ×2 IMPLANT
SUT VIC AB 3-0 SH 27 (SUTURE) ×2
SUT VIC AB 3-0 SH 27X BRD (SUTURE) ×1 IMPLANT
SUT VIC AB 4-0 FS2 27 (SUTURE) ×2 IMPLANT
SWABSTK COMLB BENZOIN TINCTURE (MISCELLANEOUS) ×2 IMPLANT
SYR 10ML LL (SYRINGE) ×2 IMPLANT
SYR 10ML SLIP (SYRINGE) ×2 IMPLANT

## 2021-05-07 NOTE — Anesthesia Postprocedure Evaluation (Signed)
Anesthesia Post Note  Patient: Peggy Bates  Procedure(s) Performed: INSERTION PORT-A-CATH (Right Chest)  Patient location during evaluation: PACU Anesthesia Type: General Level of consciousness: awake and alert Pain management: pain level controlled Vital Signs Assessment: post-procedure vital signs reviewed and stable Respiratory status: spontaneous breathing, nonlabored ventilation, respiratory function stable and patient connected to nasal cannula oxygen Cardiovascular status: blood pressure returned to baseline and stable Postop Assessment: no apparent nausea or vomiting Anesthetic complications: no   No complications documented.   Last Vitals:  Vitals:   05/07/21 1127 05/07/21 1157  BP: 121/86 110/71  Pulse: 63 68  Resp: 14 16  Temp: 36.5 C   SpO2: 99% 100%    Last Pain:  Vitals:   05/07/21 1157  TempSrc:   PainSc: 0-No pain                 Precious Haws Delisa Finck

## 2021-05-07 NOTE — Transfer of Care (Signed)
Immediate Anesthesia Transfer of Care Note  Patient: Peggy Bates  Procedure(s) Performed: INSERTION PORT-A-CATH (Right Chest)  Patient Location: PACU  Anesthesia Type:General  Level of Consciousness: awake and alert   Airway & Oxygen Therapy: Patient Spontanous Breathing and Patient connected to face mask oxygen  Post-op Assessment: Report given to RN and Post -op Vital signs reviewed and stable  Post vital signs: Reviewed and stable  Last Vitals:  Vitals Value Taken Time  BP 110/76 05/07/21 1037  Temp 36.5 C 05/07/21 1037  Pulse 63 05/07/21 1039  Resp 10 05/07/21 1039  SpO2 100 % 05/07/21 1039  Vitals shown include unvalidated device data.  Last Pain:  Vitals:   05/07/21 0825  TempSrc: Temporal  PainSc: 0-No pain         Complications: No complications documented.

## 2021-05-07 NOTE — Anesthesia Preprocedure Evaluation (Signed)
Anesthesia Evaluation  Patient identified by MRN, date of birth, ID band Patient awake    Reviewed: Allergy & Precautions, H&P , NPO status , Patient's Chart, lab work & pertinent test results  History of Anesthesia Complications Negative for: history of anesthetic complications  Airway Mallampati: II  TM Distance: >3 FB Neck ROM: full    Dental  (+) Chipped   Pulmonary neg pulmonary ROS, neg shortness of breath,    Pulmonary exam normal        Cardiovascular Exercise Tolerance: Good (-) angina(-) Past MI negative cardio ROS Normal cardiovascular exam     Neuro/Psych PSYCHIATRIC DISORDERS negative neurological ROS  negative psych ROS   GI/Hepatic negative GI ROS, Neg liver ROS, neg GERD  ,  Endo/Other  negative endocrine ROS  Renal/GU negative Renal ROS  negative genitourinary   Musculoskeletal   Abdominal   Peds  Hematology negative hematology ROS (+)   Anesthesia Other Findings Past Medical History: No date: Arthritis     Comment:  knees 12/2020: COVID-19 No date: Depression No date: Goiter 04/07/2021: Malignant neoplasm of left female breast [facility])  Past Surgical History: No date: KNEE ARTHROSCOPY No date: TOOTH EXTRACTION 2010: TRANSCERVICAL UTERINE FIBROID(S) ABLATION  BMI    Body Mass Index: 32.11 kg/m      Reproductive/Obstetrics negative OB ROS                             Anesthesia Physical Anesthesia Plan  ASA: II  Anesthesia Plan: General   Post-op Pain Management:    Induction: Intravenous  PONV Risk Score and Plan: Propofol infusion and TIVA  Airway Management Planned: Natural Airway and Nasal Cannula  Additional Equipment:   Intra-op Plan:   Post-operative Plan:   Informed Consent: I have reviewed the patients History and Physical, chart, labs and discussed the procedure including the risks, benefits and alternatives for the proposed anesthesia  with the patient or authorized representative who has indicated his/her understanding and acceptance.     Dental Advisory Given  Plan Discussed with: Anesthesiologist, CRNA and Surgeon  Anesthesia Plan Comments: (Patient consented for risks of anesthesia including but not limited to:  - adverse reactions to medications - risk of airway placement if required - damage to eyes, teeth, lips or other oral mucosa - nerve damage due to positioning  - sore throat or hoarseness - Damage to heart, brain, nerves, lungs, other parts of body or loss of life  Patient voiced understanding.)        Anesthesia Quick Evaluation

## 2021-05-07 NOTE — OR Nursing (Addendum)
Per Dr. Bary Castilla (via staff in [place] #8); he does not need to see pt in pacu prior to discharge.  Also, he does not need to see pt for postop appt, [facility].

## 2021-05-07 NOTE — Telephone Encounter (Signed)
Received 11 slides from [facility]. Slides taken to histology lab along with path report and slide consult order. Slides placed in specimen container in histology lab.

## 2021-05-07 NOTE — Discharge Instructions (Signed)

## 2021-05-07 NOTE — Anesthesia Procedure Notes (Signed)
Performed by: Demetrius Charity, CRNA Pre-anesthesia Checklist: Patient identified, Emergency Drugs available, Suction available, Patient being monitored and Timeout performed Oxygen Delivery Method: Simple face mask Induction Type: IV induction Placement Confirmation: CO2 detector and positive ETCO2

## 2021-05-07 NOTE — Op Note (Signed)
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  Postoperative diagnosis: Same.  Operative procedure: Right subclavian PowerPort with ultrasound and fluoroscopic guidance.  Operating surgeon: Hervey Ard, MD.  Anesthesia: Attended local, 10 cc 1% Xylocaine plain.  Estimated blood loss: 5 cc.  Clinical note: This 55 year old woman was recently identified with a left breast mass and biopsy shows evidence of triple negative cancer.  She is felt to be a candidate for neoadjuvant chemotherapy.  She received Ancef prior to the procedure.  Operative note: With the patient in the supine position the right neck chest and shoulder was cleansed with ChloraPrep and draped.  Ultrasound was used to interrogate the right subclavian vein which was patent.  This was then cannulated under ultrasound guidance.  The guidewire was placed followed by the dilating sleeve.  The catheter was advanced.  There was a slight crack as it went over the first rib and this required insertion of the guidewire into the catheter for passage.  The catheter was then positioned near the junction of the SVC and right atrium.  It was tunneled to a pocket on the right anterior chest where it was attached to the port.  The port easily irrigated and aspirated.  The port was anchored to the deep tissue with 3-0 Prolene sutures.  The adipose layer was closed with a running 3-0 Vicryl suture.  The skin was closed with a running 4-0 Vicryl subcuticular suture at both sites.  Benzoin and Steri-Strips followed by Telfa and Tegaderm dressings were applied.  The patient was taken to the PACU in stable condition.  Rec portable chest x-ray showed the catheter tip at the junction of the mid and distal SVC.  No evidence of pneumothorax.

## 2021-05-07 NOTE — H&P (Signed)
Peggy Bates [id number] 03-Oct-1966     HPI:  Patient with breast cancer and a candidate for neo-adjuvant chemotherapy. For port placement.  Medications Prior to Admission  Medication Sig Dispense Refill Last Dose  . Acetaminophen (TYLENOL 8 HOUR ARTHRITIS PAIN PO) Take 1 tablet by mouth as needed.   05/07/2021 at 0530  . cetirizine (ZYRTEC) 10 MG tablet Take 10 mg by mouth at bedtime as needed for allergies.   Past Month at Unknown time  . cholecalciferol (VITAMIN D) 25 MCG (1000 UNIT) tablet Take 1,000 Units by mouth daily.   05/05/2021  . diclofenac Sodium (VOLTAREN) 1 % GEL Apply 1 application topically daily.   05/05/2021  . Flaxseed, Linseed, (FLAXSEED OIL PO) Take 2 tablets by mouth daily.   05/05/2021  . gabapentin (NEURONTIN) 600 MG tablet Take 600 mg by mouth every morning.   05/07/2021 at 0530  . Glucosamine-Chondroitin (MOVE FREE PO) Take 1 tablet by mouth daily.   05/05/2021  . meloxicam (MOBIC) 15 MG tablet Take 15 mg by mouth daily.   05/05/2021  . Specialty Vitamins Products (ONE-A-DAY ENERGY FORMULA PO) Take 1 tablet by mouth daily.   05/05/2021  . dexamethasone (DECADRON) 4 MG tablet Take 2 tablets (8 mg total) by mouth daily. Take daily for 2 days after chemo. Take with food. 30 tablet 1   . levonorgestrel (MIRENA) 20 MCG/DAY IUD 1 each by Intrauterine route once.     . lidocaine-prilocaine (EMLA) cream Apply to affected area once 30 g 3   . ondansetron (ZOFRAN) 8 MG tablet Take 1 tablet (8 mg total) by mouth 2 (two) times daily as needed. Start on the third day after chemotherapy. 30 tablet 1   . prochlorperazine (COMPAZINE) 10 MG tablet Take 1 tablet (10 mg total) by mouth every 6 (six) hours as needed (Nausea or vomiting). 30 tablet 1   . TURMERIC PO Take 1 capsule by mouth daily.   05/05/2021   No Known Allergies Past Medical History:  Diagnosis Date  . Arthritis    knees  . COVID-19 12/2020  . Depression   . Goiter   . Malignant neoplasm of left female breast ([place])  04/07/2021   Past Surgical History:  Procedure Laterality Date  . KNEE ARTHROSCOPY    . TOOTH EXTRACTION    . TRANSCERVICAL UTERINE FIBROID(S) ABLATION  2010   Social History   Socioeconomic History  . Marital status: Married    Spouse name: Not on file  . Number of children: Not on file  . Years of education: Not on file  . Highest education level: Not on file  Occupational History  . Not on file  Tobacco Use  . Smoking status: Never Smoker  . Smokeless tobacco: Never Used  Vaping Use  . Vaping Use: Never used  Substance and Sexual Activity  . Alcohol use: Never  . Drug use: Never  . Sexual activity: Yes  Other Topics Concern  . Not on file  Social History Narrative   ** Merged History Encounter **       Social Determinants of Health   Financial Resource Strain: Not on file  Food Insecurity: Not on file  Transportation Needs: Not on file  Physical Activity: Not on file  Stress: Not on file  Social Connections: Not on file  Intimate Partner Violence: Not on file   Social History   Social History Narrative   ** Merged History Encounter **         ROS: Negative.  PE: HEENT: Negative. Lungs: Clear. Cardio: RR.    Assessment/Plan:  Proceed with planned port placement.    Forest Gleason [facility] 05/07/2021

## 2021-05-10 ENCOUNTER — Encounter: Payer: Self-pay | Admitting: Oncology

## 2021-05-10 ENCOUNTER — Telehealth: Payer: Self-pay

## 2021-05-10 DIAGNOSIS — C50412 Malignant neoplasm of upper-outer quadrant of left female breast: Secondary | ICD-10-CM

## 2021-05-10 LAB — POCT PREGNANCY, URINE: Preg Test, Ur: NEGATIVE

## 2021-05-10 NOTE — Telephone Encounter (Signed)
Waiting to see if breast US needs auth. Pending.

## 2021-05-10 NOTE — Telephone Encounter (Signed)
Per Dr. Tasia Catchings, patient was discussed in Tumor board and pt will need STAT MRI bilat breast and Left diagnositc mammo & [place]. Please schedule and notify pt of appt.

## 2021-05-10 NOTE — Patient Instructions (Signed)
Doxorubicin injection What is this medicine? DOXORUBICIN (dox oh ROO bi sin) is a chemotherapy drug. It is used to treat many kinds of cancer like leukemia, lymphoma, neuroblastoma, sarcoma, and Wilms' tumor. It is also used to treat bladder cancer, breast cancer, lung cancer, ovarian cancer, stomach cancer, and thyroid cancer. This medicine may be used for other purposes; ask your health care provider or pharmacist if you have questions. COMMON BRAND NAME(S): Adriamycin, Adriamycin PFS, Adriamycin RDF, Rubex What should I tell my health care provider before I take this medicine? They need to know if you have any of these conditions:  heart disease  history of low blood counts caused by a medicine  liver disease  recent or ongoing radiation therapy  an unusual or allergic reaction to doxorubicin, other chemotherapy agents, other medicines, foods, dyes, or preservatives  pregnant or trying to get pregnant  breast-feeding How should I use this medicine? This drug is given as an infusion into a vein. It is administered in a hospital or clinic by a specially trained health care professional. If you have pain, swelling, burning or any unusual feeling around the site of your injection, tell your health care professional right away. Talk to your pediatrician regarding the use of this medicine in children. Special care may be needed. Overdosage: If you think you have taken too much of this medicine contact a poison control center or emergency room at once. NOTE: This medicine is only for you. Do not share this medicine with others. What if I miss a dose? It is important not to miss your dose. Call your doctor or health care professional if you are unable to keep an appointment. What may interact with this medicine? This medicine may interact with the following medications:  6-mercaptopurine  paclitaxel  phenytoin  St. John's Wort  trastuzumab  verapamil This list may not describe  all possible interactions. Give your health care provider a list of all the medicines, herbs, non-prescription drugs, or dietary supplements you use. Also tell them if you smoke, drink alcohol, or use illegal drugs. Some items may interact with your medicine. What should I watch for while using this medicine? This drug may make you feel generally unwell. This is not uncommon, as chemotherapy can affect healthy cells as well as cancer cells. Report any side effects. Continue your course of treatment even though you feel ill unless your doctor tells you to stop. There is a maximum amount of this medicine you should receive throughout your life. The amount depends on the medical condition being treated and your overall health. Your doctor will watch how much of this medicine you receive in your lifetime. Tell your doctor if you have taken this medicine before. You may need blood work done while you are taking this medicine. Your urine may turn red for a few days after your dose. This is not blood. If your urine is dark or brown, call your doctor. In some cases, you may be given additional medicines to help with side effects. Follow all directions for their use. Call your doctor or health care professional for advice if you get a fever, chills or sore throat, or other symptoms of a cold or flu. Do not treat yourself. This drug decreases your body's ability to fight infections. Try to avoid being around people who are sick. This medicine may increase your risk to bruise or bleed. Call your doctor or health care professional if you notice any unusual bleeding. Talk to your doctor   about your risk of cancer. You may be more at risk for certain types of cancers if you take this medicine. Do not become pregnant while taking this medicine or for 6 months after stopping it. Women should inform their doctor if they wish to become pregnant or think they might be pregnant. Men should not father a child while taking this  medicine and for 6 months after stopping it. There is a potential for serious side effects to an unborn child. Talk to your health care professional or pharmacist for more information. Do not breast-feed an infant while taking this medicine. This medicine has caused ovarian failure in some women and reduced sperm counts in some men This medicine may interfere with the ability to have a child. Talk with your doctor or health care professional if you are concerned about your fertility. This medicine may cause a decrease in Co-Enzyme Q-10. You should make sure that you get enough Co-Enzyme Q-10 while you are taking this medicine. Discuss the foods you eat and the vitamins you take with your health care professional. What side effects may I notice from receiving this medicine? Side effects that you should report to your doctor or health care professional as soon as possible:  allergic reactions like skin rash, itching or hives, swelling of the face, lips, or tongue  breathing problems  chest pain  fast or irregular heartbeat  low blood counts - this medicine may decrease the number of white blood cells, red blood cells and platelets. You may be at increased risk for infections and bleeding.  pain, redness, or irritation at site where injected  signs of infection - fever or chills, cough, sore throat, pain or difficulty passing urine  signs of decreased platelets or bleeding - bruising, pinpoint red spots on the skin, black, tarry stools, blood in the urine  swelling of the ankles, feet, hands  tiredness  weakness Side effects that usually do not require medical attention (report to your doctor or health care professional if they continue or are bothersome):  diarrhea  hair loss  mouth sores  nail discoloration or damage  nausea  red colored urine  vomiting This list may not describe all possible side effects. Call your doctor for medical advice about side effects. You may report  side effects to FDA at 1-800-FDA-1088. Where should I keep my medicine? This drug is given in a hospital or clinic and will not be stored at home. NOTE: This sheet is a summary. It may not cover all possible information. If you have questions about this medicine, talk to your doctor, pharmacist, or health care provider.  2021 [employer] Standard (2017-07-19 11:01:26) Cyclophosphamide Injection What is this medicine? CYCLOPHOSPHAMIDE (sye kloe FOSS fa mide) is a chemotherapy drug. It slows the growth of cancer cells. This medicine is used to treat many types of cancer like lymphoma, myeloma, leukemia, breast cancer, and ovarian cancer, to name a few. This medicine may be used for other purposes; ask your health care provider or pharmacist if you have questions. COMMON BRAND NAME(S): Cytoxan, Neosar What should I tell my health care provider before I take this medicine? They need to know if you have any of these conditions:  heart disease  history of irregular heartbeat  infection  kidney disease  liver disease  low blood counts, like white cells, platelets, or red blood cells  on hemodialysis  recent or ongoing radiation therapy  scarring or thickening of the lungs  trouble passing urine  an  unusual or allergic reaction to cyclophosphamide, other medicines, foods, dyes, or preservatives  pregnant or trying to get pregnant  breast-feeding How should I use this medicine? This drug is usually given as an injection into a vein or muscle or by infusion into a vein. It is administered in a hospital or clinic by a specially trained health care professional. Talk to your pediatrician regarding the use of this medicine in children. Special care may be needed. Overdosage: If you think you have taken too much of this medicine contact a poison control center or emergency room at once. NOTE: This medicine is only for you. Do not share this medicine with others. What if I miss a  dose? It is important not to miss your dose. Call your doctor or health care professional if you are unable to keep an appointment. What may interact with this medicine?  amphotericin B  azathioprine  certain antivirals for HIV or hepatitis  certain medicines for blood pressure, heart disease, irregular heart beat  certain medicines that treat or prevent blood clots like warfarin  certain other medicines for cancer  cyclosporine  etanercept  indomethacin  medicines that relax muscles for surgery  medicines to increase blood counts  metronidazole This list may not describe all possible interactions. Give your health care provider a list of all the medicines, herbs, non-prescription drugs, or dietary supplements you use. Also tell them if you smoke, drink alcohol, or use illegal drugs. Some items may interact with your medicine. What should I watch for while using this medicine? Your condition will be monitored carefully while you are receiving this medicine. You may need blood work done while you are taking this medicine. Drink water or other fluids as directed. Urinate often, even at night. Some products may contain alcohol. Ask your health care professional if this medicine contains alcohol. Be sure to tell all health care professionals you are taking this medicine. Certain medicines, like metronidazole and disulfiram, can cause an unpleasant reaction when taken with alcohol. The reaction includes flushing, headache, nausea, vomiting, sweating, and increased thirst. The reaction can last from 30 minutes to several hours. Do not become pregnant while taking this medicine or for 1 year after stopping it. Women should inform their health care professional if they wish to become pregnant or think they might be pregnant. Men should not father a child while taking this medicine and for 4 months after stopping it. There is potential for serious side effects to an unborn child. Talk to your  health care professional for more information. Do not breast-feed an infant while taking this medicine or for 1 week after stopping it. This medicine has caused ovarian failure in some women. This medicine may make it more difficult to get pregnant. Talk to your health care professional if you are concerned about your fertility. This medicine has caused decreased sperm counts in some men. This may make it more difficult to father a child. Talk to your health care professional if you are concerned about your fertility. Call your health care professional for advice if you get a fever, chills, or sore throat, or other symptoms of a cold or flu. Do not treat yourself. This medicine decreases your body's ability to fight infections. Try to avoid being around people who are sick. Avoid taking medicines that contain aspirin, acetaminophen, ibuprofen, naproxen, or ketoprofen unless instructed by your health care professional. These medicines may hide a fever. Talk to your health care professional about your risk of cancer.   You may be more at risk for certain types of cancer if you take this medicine. If you are going to need surgery or other procedure, tell your health care professional that you are using this medicine. Be careful brushing or flossing your teeth or using a toothpick because you may get an infection or bleed more easily. If you have any dental work done, tell your dentist you are receiving this medicine. What side effects may I notice from receiving this medicine? Side effects that you should report to your doctor or health care professional as soon as possible:  allergic reactions like skin rash, itching or hives, swelling of the face, lips, or tongue  breathing problems  nausea, vomiting  signs and symptoms of bleeding such as bloody or black, tarry stools; red or dark brown urine; spitting up blood or brown material that looks like coffee grounds; red spots on the skin; unusual bruising  or bleeding from the eyes, gums, or nose  signs and symptoms of heart failure like fast, irregular heartbeat, sudden weight gain; swelling of the ankles, feet, hands  signs and symptoms of infection like fever; chills; cough; sore throat; pain or trouble passing urine  signs and symptoms of kidney injury like trouble passing urine or change in the amount of urine  signs and symptoms of liver injury like dark yellow or brown urine; general ill feeling or flu-like symptoms; light-colored stools; loss of appetite; nausea; right upper belly pain; unusually weak or tired; yellowing of the eyes or skin Side effects that usually do not require medical attention (report to your doctor or health care professional if they continue or are bothersome):  confusion  decreased hearing  diarrhea  facial flushing  hair loss  headache  loss of appetite  missed menstrual periods  signs and symptoms of low red blood cells or anemia such as unusually weak or tired; feeling faint or lightheaded; falls  skin discoloration This list may not describe all possible side effects. Call your doctor for medical advice about side effects. You may report side effects to FDA at 1-800-FDA-1088. Where should I keep my medicine? This drug is given in a hospital or clinic and will not be stored at home. NOTE: This sheet is a summary. It may not cover all possible information. If you have questions about this medicine, talk to your doctor, pharmacist, or health care provider.  2021 [employer] Standard (2019-09-09 09:53:29) Paclitaxel injection What is this medicine? PACLITAXEL (PAK li TAX el) is a chemotherapy drug. It targets fast dividing cells, like cancer cells, and causes these cells to die. This medicine is used to treat ovarian cancer, breast cancer, lung cancer, Kaposi's sarcoma, and other cancers. This medicine may be used for other purposes; ask your health care provider or pharmacist if you have  questions. COMMON BRAND NAME(S): Onxol, Taxol What should I tell my health care provider before I take this medicine? They need to know if you have any of these conditions:  history of irregular heartbeat  liver disease  low blood counts, like low white cell, platelet, or red cell counts  lung or breathing disease, like asthma  tingling of the fingers or toes, or other nerve disorder  an unusual or allergic reaction to paclitaxel, alcohol, polyoxyethylated castor oil, other chemotherapy, other medicines, foods, dyes, or preservatives  pregnant or trying to get pregnant  breast-feeding How should I use this medicine? This drug is given as an infusion into a vein. It is administered in a  hospital or clinic by a specially trained health care professional. Talk to your pediatrician regarding the use of this medicine in children. Special care may be needed. Overdosage: If you think you have taken too much of this medicine contact a poison control center or emergency room at once. NOTE: This medicine is only for you. Do not share this medicine with others. What if I miss a dose? It is important not to miss your dose. Call your doctor or health care professional if you are unable to keep an appointment. What may interact with this medicine? Do not take this medicine with any of the following medications:  live virus vaccines This medicine may also interact with the following medications:  antiviral medicines for hepatitis, HIV or AIDS  certain antibiotics like erythromycin and clarithromycin  certain medicines for fungal infections like ketoconazole and itraconazole  certain medicines for seizures like carbamazepine, phenobarbital, phenytoin  gemfibrozil  nefazodone  rifampin  St. John's wort This list may not describe all possible interactions. Give your health care provider a list of all the medicines, herbs, non-prescription drugs, or dietary supplements you use. Also tell  them if you smoke, drink alcohol, or use illegal drugs. Some items may interact with your medicine. What should I watch for while using this medicine? Your condition will be monitored carefully while you are receiving this medicine. You will need important blood work done while you are taking this medicine. This medicine can cause serious allergic reactions. To reduce your risk you will need to take other medicine(s) before treatment with this medicine. If you experience allergic reactions like skin rash, itching or hives, swelling of the face, lips, or tongue, tell your doctor or health care professional right away. In some cases, you may be given additional medicines to help with side effects. Follow all directions for their use. This drug may make you feel generally unwell. This is not uncommon, as chemotherapy can affect healthy cells as well as cancer cells. Report any side effects. Continue your course of treatment even though you feel ill unless your doctor tells you to stop. Call your doctor or health care professional for advice if you get a fever, chills or sore throat, or other symptoms of a cold or flu. Do not treat yourself. This drug decreases your body's ability to fight infections. Try to avoid being around people who are sick. This medicine may increase your risk to bruise or bleed. Call your doctor or health care professional if you notice any unusual bleeding. Be careful brushing and flossing your teeth or using a toothpick because you may get an infection or bleed more easily. If you have any dental work done, tell your dentist you are receiving this medicine. Avoid taking products that contain aspirin, acetaminophen, ibuprofen, naproxen, or ketoprofen unless instructed by your doctor. These medicines may hide a fever. Do not become pregnant while taking this medicine. Women should inform their doctor if they wish to become pregnant or think they might be pregnant. There is a potential  for serious side effects to an unborn child. Talk to your health care professional or pharmacist for more information. Do not breast-feed an infant while taking this medicine. Men are advised not to father a child while receiving this medicine. This product may contain alcohol. Ask your pharmacist or healthcare provider if this medicine contains alcohol. Be sure to tell all healthcare providers you are taking this medicine. Certain medicines, like metronidazole and disulfiram, can cause an unpleasant reaction when  taken with alcohol. The reaction includes flushing, headache, nausea, vomiting, sweating, and increased thirst. The reaction can last from 30 minutes to several hours. What side effects may I notice from receiving this medicine? Side effects that you should report to your doctor or health care professional as soon as possible:  allergic reactions like skin rash, itching or hives, swelling of the face, lips, or tongue  breathing problems  changes in vision  fast, irregular heartbeat  high or low blood pressure  mouth sores  pain, tingling, numbness in the hands or feet  signs of decreased platelets or bleeding - bruising, pinpoint red spots on the skin, black, tarry stools, blood in the urine  signs of decreased red blood cells - unusually weak or tired, feeling faint or lightheaded, falls  signs of infection - fever or chills, cough, sore throat, pain or difficulty passing urine  signs and symptoms of liver injury like dark yellow or brown urine; general ill feeling or flu-like symptoms; light-colored stools; loss of appetite; nausea; right upper belly pain; unusually weak or tired; yellowing of the eyes or skin  swelling of the ankles, feet, hands  unusually slow heartbeat Side effects that usually do not require medical attention (report to your doctor or health care professional if they continue or are bothersome):  diarrhea  hair loss  loss of appetite  muscle or  joint pain  nausea, vomiting  pain, redness, or irritation at site where injected  tiredness This list may not describe all possible side effects. Call your doctor for medical advice about side effects. You may report side effects to FDA at 1-800-FDA-1088. Where should I keep my medicine? This drug is given in a hospital or clinic and will not be stored at home. NOTE: This sheet is a summary. It may not cover all possible information. If you have questions about this medicine, talk to your doctor, pharmacist, or health care provider.  2021 [employer] Standard (2019-11-06 13:37:23)

## 2021-05-11 ENCOUNTER — Inpatient Hospital Stay (HOSPITAL_BASED_OUTPATIENT_CLINIC_OR_DEPARTMENT_OTHER): Admitting: Licensed Clinical Social Worker

## 2021-05-11 ENCOUNTER — Inpatient Hospital Stay

## 2021-05-11 ENCOUNTER — Other Ambulatory Visit: Payer: Self-pay

## 2021-05-11 ENCOUNTER — Encounter: Payer: Self-pay | Admitting: Licensed Clinical Social Worker

## 2021-05-11 ENCOUNTER — Inpatient Hospital Stay: Admitting: Licensed Clinical Social Worker

## 2021-05-11 DIAGNOSIS — Z803 Family history of malignant neoplasm of breast: Secondary | ICD-10-CM

## 2021-05-11 DIAGNOSIS — Z171 Estrogen receptor negative status [ER-]: Secondary | ICD-10-CM

## 2021-05-11 DIAGNOSIS — C50412 Malignant neoplasm of upper-outer quadrant of left female breast: Secondary | ICD-10-CM | POA: Diagnosis not present

## 2021-05-11 LAB — CBC WITH DIFFERENTIAL/PLATELET
Abs Immature Granulocytes: 0 10*3/uL (ref 0.00–0.07)
Basophils Absolute: 0 10*3/uL (ref 0.0–0.1)
Basophils Relative: 0 %
Eosinophils Absolute: 0.1 10*3/uL (ref 0.0–0.5)
Eosinophils Relative: 1 %
HCT: 39.3 % (ref 36.0–46.0)
Hemoglobin: 12.4 g/dL (ref 12.0–15.0)
Immature Granulocytes: 0 %
Lymphocytes Relative: 43 %
Lymphs Abs: 1.5 10*3/uL (ref 0.7–4.0)
MCH: 27.3 pg (ref 26.0–34.0)
MCHC: 31.6 g/dL (ref 30.0–36.0)
MCV: 86.4 fL (ref 80.0–100.0)
Monocytes Absolute: 0.3 10*3/uL (ref 0.1–1.0)
Monocytes Relative: 10 %
Neutro Abs: 1.6 10*3/uL — ABNORMAL LOW (ref 1.7–7.7)
Neutrophils Relative %: 46 %
Platelets: 202 10*3/uL (ref 150–400)
RBC: 4.55 MIL/uL (ref 3.87–5.11)
RDW: 13.5 % (ref 11.5–15.5)
WBC: 3.5 10*3/uL — ABNORMAL LOW (ref 4.0–10.5)
nRBC: 0 % (ref 0.0–0.2)

## 2021-05-11 LAB — COMPREHENSIVE METABOLIC PANEL
ALT: 91 U/L — ABNORMAL HIGH (ref 0–44)
AST: 68 U/L — ABNORMAL HIGH (ref 15–41)
Albumin: 4.4 g/dL (ref 3.5–5.0)
Alkaline Phosphatase: 72 U/L (ref 38–126)
Anion gap: 8 (ref 5–15)
BUN: 14 mg/dL (ref 6–20)
CO2: 28 mmol/L (ref 22–32)
Calcium: 9.5 mg/dL (ref 8.9–10.3)
Chloride: 101 mmol/L (ref 98–111)
Creatinine, Ser: 0.66 mg/dL (ref 0.44–1.00)
GFR, Estimated: >60 mL/min (ref >60)
Glucose, Bld: 96 mg/dL (ref 70–99)
Potassium: 4.5 mmol/L (ref 3.5–5.1)
Sodium: 137 mmol/L (ref 135–145)
Total Bilirubin: 0.7 mg/dL (ref 0.3–1.2)
Total Protein: 7.9 g/dL (ref 6.5–8.1)

## 2021-05-11 NOTE — Progress Notes (Signed)
REFERRING PROVIDER: Earlie Server, MD [place],  [place] [address]  PRIMARY PROVIDER:  Renee Rival, NP  PRIMARY REASON FOR VISIT:  1. Carcinoma of upper-outer quadrant of left breast in female, estrogen receptor negative ([place])   2. Family history of breast cancer      HISTORY OF PRESENT ILLNESS:   Peggy Bates, a 55 y.o. female, was seen for a [place] cancer genetics consultation at the request of Dr. Tasia Catchings due to a personal and family history of breast cancer.  Peggy Bates presents to clinic today to discuss the possibility of a hereditary predisposition to cancer, genetic testing, and to further clarify her future cancer risks, as well as potential cancer risks for family members.   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 The treatment plan includes neoadjuvant chemotherapy.   CANCER HISTORY:  Oncology History Overview Note  # Pathology dated April 07, 2021 from [facility] in [place]:  Ultrasound-guided core biopsy. Nuclear grade 3, high-grade. P53: 70% staining. Ki-67: 70% staining.  ER negative, PR negative, HER2 negative. Mitotic rate of 13 mitoses per high-power field.  The patient brought a copy of her imaging studies with her from [place] and these have been independently reviewed.  Screening mammogram dated March 30, 2021 showed an asymmetric nodular density in the superior lateral aspect of the left breast. The right breast was unremarkable BI-RADS-0.  Ultrasound examination of the left breast showed a 1.1 cm round, nonparallel hypoechoic mass with angular margins. BI-RADS-4.  Family history of breast cancer and a sister in her late 85s, early 26s. No history of genetic testing available at this time.   Carcinoma of upper-outer quadrant of left breast in female, estrogen receptor negative ([place])  04/21/2021 Initial Diagnosis   Carcinoma of upper-outer quadrant of left breast in  female, estrogen receptor negative ([place])   04/29/2021 Cancer Staging   Staging form: Breast, AJCC 8th Edition - Clinical stage from 04/29/2021: Stage IB (cT1c, cN0, cM0, G3, ER-, PR-, HER2-) - Signed by Earlie Server, MD on 04/29/2021 Stage prefix: Initial diagnosis Histologic grading system: 3 grade system   05/12/2021 -  Chemotherapy    Patient is on Treatment Plan: BREAST ADJUVANT DOSE DENSE AC Q14D / PACLITAXEL Q7D         RISK FACTORS:  Menarche was at age 67.  First live birth at age 72.  OCP use: yes Ovaries intact: yes.  Hysterectomy: no.  Colonoscopy: yes; normal. Mammogram within the last year: yes. Number of breast biopsies: 1.   Past Medical History:  Diagnosis Date  . Arthritis    knees  . COVID-19 12/2020  . Depression   . Family history of breast cancer   . Goiter   . Malignant neoplasm of left female breast ([place]) 04/07/2021    Past Surgical History:  Procedure Laterality Date  . KNEE ARTHROSCOPY    . PORTACATH PLACEMENT Right 05/07/2021   Procedure: INSERTION PORT-A-CATH;  Surgeon: Robert Bellow, MD;  Location: [facility] ORS;  Service: General;  Laterality: Right;  . TOOTH EXTRACTION    . TRANSCERVICAL UTERINE FIBROID(S) ABLATION  2010    Social History   Socioeconomic History  . Marital status: Married    Spouse name: Not on file  . Number of children: Not on file  . Years of education: Not on file  . Highest education level: Not on file  Occupational History  . Not on file  Tobacco Use  . Smoking status: Never Smoker  . Smokeless tobacco: Never Used  Vaping Use  . Vaping Use: Never used  Substance and Sexual Activity  . Alcohol use: Never  . Drug use: Never  . Sexual activity: Yes  Other Topics Concern  . Not on file  Social History Narrative   ** Merged History Encounter **       Social Determinants of Health   Financial Resource Strain: Not on file  Food Insecurity: Not on file  Transportation Needs: Not on file  Physical  Activity: Not on file  Stress: Not on file  Social Connections: Not on file     FAMILY HISTORY:  We obtained a detailed, 4-generation family history.  Significant diagnoses are listed below: Family History  Problem Relation Age of Onset  . Breast cancer Paternal Grandmother   . Breast cancer Sister        dx 58s, recurrence x3  . Diabetes Sister   . Diabetes Father   . Throat cancer Maternal Grandmother    Peggy Bates had 2 sons and 1 daughter, her daughter passed at 37. Patient has 2 sisters, 1 brother, and 1 maternal half sister. One of her full sisters was diagnosed with breast cancer in her 46s initially and has had recurrences of this cancer that is now metastatic, she is currently 29 and patient does not believe she has had genetic testing.   Peggy Bates mother died at 34, no cancer. Patient had 3 maternal uncles, no cancers. Maternal grandmother had throat cancer and history of smoking, no info about maternal grandfather.   Peggy Bates father died over 51. Patient had 3 paternal uncles and 1 aunt. A paternal uncle had cancer, unknown type.  A paternal cousin had lung cancer. Paternal grandmother had breast cancer, unknown age. No information about paternal grandfather.  Peggy Bates is unaware of previous family history of genetic testing for hereditary cancer risks. There is no reported Ashkenazi Jewish ancestry. There is no known consanguinity.    GENETIC COUNSELING ASSESSMENT: Peggy Bates is a 55 y.o. female with a personal and family history of breast cancer which is somewhat suggestive of a hereditary cancer syndrome and predisposition to cancer. We, therefore, discussed and recommended the following at today's visit.   DISCUSSION: We discussed that approximately 5-10% of breast cancer is hereditary  Most cases of hereditary breast cancer are associated with BRCA1/BRCA2 genes, although there are other genes associated with hereditary breast cancer as well .   We discussed that testing is beneficial for several reasons including surgical decision-making for breast cancer, knowing about other cancer risks, identifying potential screening and risk-reduction options that may be appropriate, and to understand if other family members could be at risk for cancer and allow them to undergo genetic testing.   We reviewed the characteristics, features and inheritance patterns of hereditary cancer syndromes. We also discussed genetic testing, including the appropriate family members to test, the process of testing, insurance coverage and turn-around-time for results. We discussed the implications of a negative, positive and/or variant of uncertain significant result. We recommended Peggy Bates pursue genetic testing for the [facility] Multi-Cancer Panel+RNA gene panel.   The Multi-Cancer Panel + RNA offered by [facility] includes sequencing and/or deletion duplication testing of the following 84 genes: AIP, ALK, APC, ATM, AXIN2,BAP1,  BARD1, BLM, BMPR1A, BRCA1, BRCA2, BRIP1, CASR, CDC73, CDH1, CDK4, CDKN1B, CDKN1C, CDKN2A (p14ARF), CDKN2A (p16INK4a), CEBPA, CHEK2, CTNNA1, DICER1, DIS3L2, EGFR (c.2369C>T, p.Thr790Met variant only), EPCAM (Deletion/duplication testing  only), FH, FLCN, GATA2, GPC3, GREM1 (Promoter region deletion/duplication testing only), HOXB13 (c.251G>A, p.Gly84Glu), HRAS, KIT, MAX, MEN1, MET, MITF (c.952G>A, p.Glu318Lys variant only), MLH1, MSH2, MSH3, MSH6, MUTYH, NBN, NF1, NF2, NTHL1, PALB2, PDGFRA, PHOX2B, PMS2, POLD1, POLE, POT1, PRKAR1A, PTCH1, PTEN, RAD50, RAD51C, RAD51D, RB1, RECQL4, RET, RUNX1, SDHAF2, SDHA (sequence changes only), SDHB, SDHC, SDHD, SMAD4, SMARCA4, SMARCB1, SMARCE1, STK11, SUFU, TERC, TERT, TMEM127, TP53, TSC1, TSC2, VHL, WRN and WT1.  Based on Peggy Bates's personal and family history of cancer, she meets medical criteria for genetic testing. Despite that she meets criteria, she may still have an out of pocket cost. We discussed that  if her out of pocket cost for testing is over $100, the laboratory will call and confirm whether she wants to proceed with testing.  If the out of pocket cost of testing is less than $100 she will be billed by the genetic testing laboratory.   PLAN: After considering the risks, benefits, and limitations, Peggy Bates provided informed consent to pursue genetic testing and the blood sample was sent to [facility] for analysis of the Multi-Cancer+RNA panel. Results should be available within approximately 2-3 weeks' time, at which point they will be disclosed by telephone to Peggy Bates, as will any additional recommendations warranted by these results. Peggy Bates will receive a summary of her genetic counseling visit and a copy of her results once available. This information will also be available in Epic.   Peggy Bates questions were answered to her satisfaction today. Our contact information was provided should additional questions or concerns arise. Thank you for the referral and allowing [place] to share in the care of your patient.   Faith Rogue, MS, [facility] Genetic Counselor [email or web address] Phone: [phone]  The patient was seen for a total of 30 minutes in face-to-face genetic counseling.  Dr. Grayland Ormond was available for discussion regarding this case.   _______________________________________________________________________ For Office Staff:  Number of people involved in session: 1 Was an Intern/ student involved with case: no

## 2021-05-12 ENCOUNTER — Ambulatory Visit
Admission: RE | Admit: 2021-05-12 | Discharge: 2021-05-12 | Disposition: A | Discharge status: Home / Self Care | Source: Ambulatory Visit | Attending: Oncology | Admitting: Oncology

## 2021-05-12 ENCOUNTER — Other Ambulatory Visit: Payer: Self-pay

## 2021-05-12 ENCOUNTER — Encounter: Payer: Self-pay | Admitting: Oncology

## 2021-05-12 DIAGNOSIS — Z171 Estrogen receptor negative status [ER-]: Secondary | ICD-10-CM | POA: Insufficient documentation

## 2021-05-12 DIAGNOSIS — C50412 Malignant neoplasm of upper-outer quadrant of left female breast: Secondary | ICD-10-CM | POA: Insufficient documentation

## 2021-05-12 IMAGING — MG MM DIGITAL DIAGNOSTIC UNILAT*L* W/ TOMO W/ CAD
6 series · 8 of 18 positions shown · non-contrast
Comparison: Outside exams from [DATE], [DATE] and
[DATE], as well as the procedure exam from [DATE].

CLINICAL DATA: Recent diagnosis of left breast carcinoma at an
outside institution. Questionable second mass noted on review of her
imaging. Patient presents for tomosynthesis imaging of the left
breast as well as ultrasound to assess for left axillary
lymphadenopathy.

EXAM:
DIGITAL DIAGNOSTIC UNILATERAL LEFT MAMMOGRAM WITH TOMOSYNTHESIS AND
CAD; ULTRASOUND LEFT BREAST LIMITED
TECHNIQUE: Left digital diagnostic mammography and breast tomosynthesis was
performed. The images were evaluated with computer-aided detection.;
Targeted ultrasound examination of the left breast was performed

[L ML synth-2D]
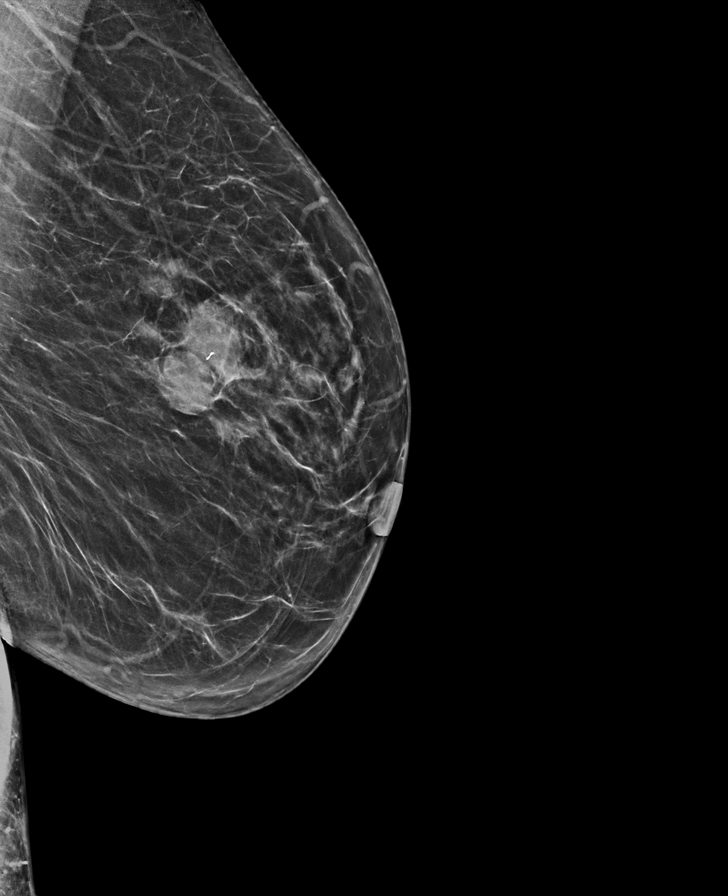

[L MLO synth-2D]
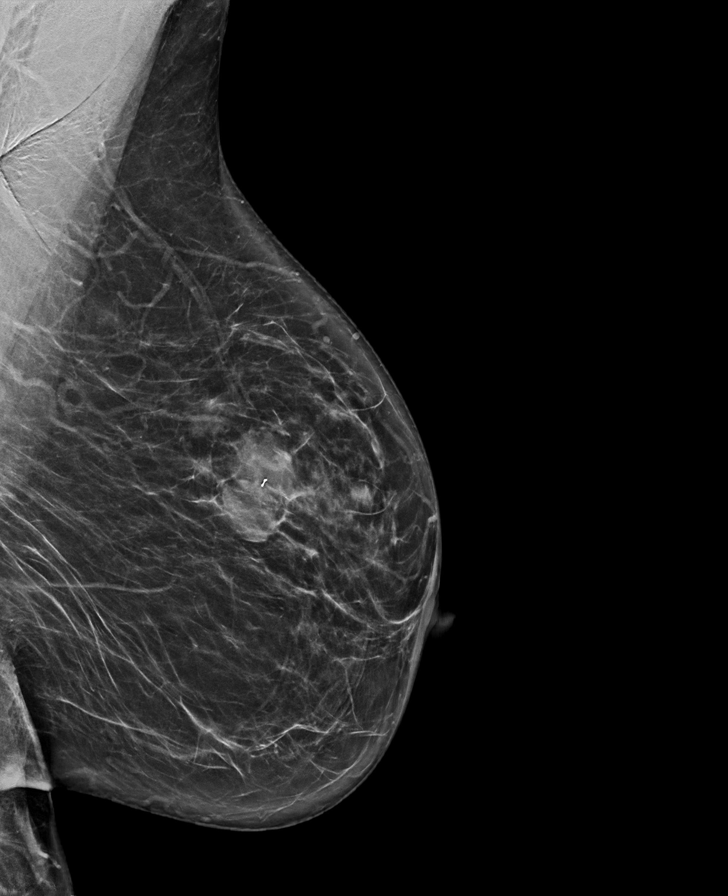

[L CC synth-2D]
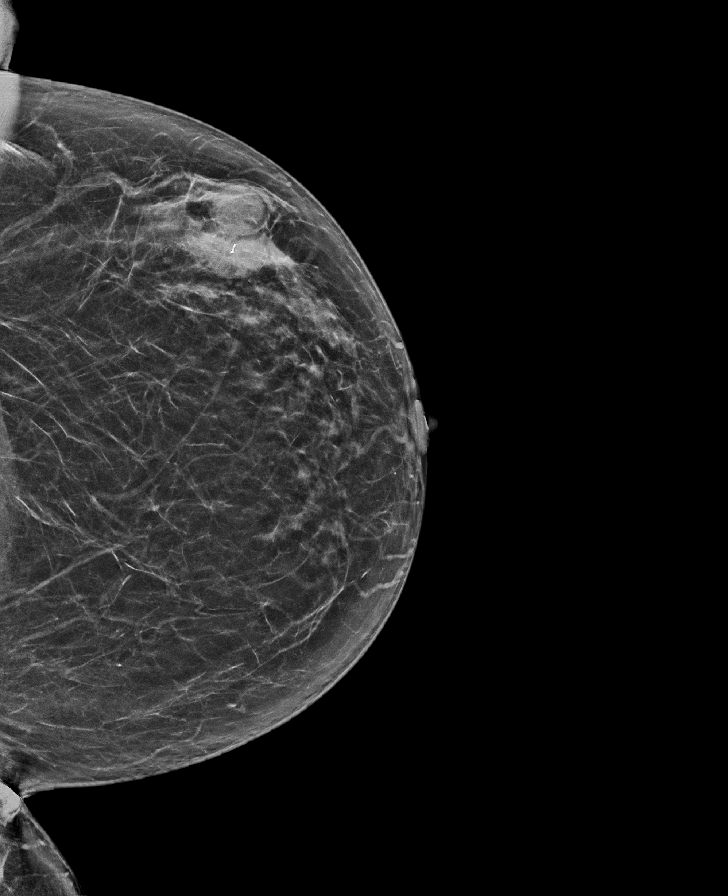

[L MLO tomo · 3 of 77 frames shown]
[frame 25/77]
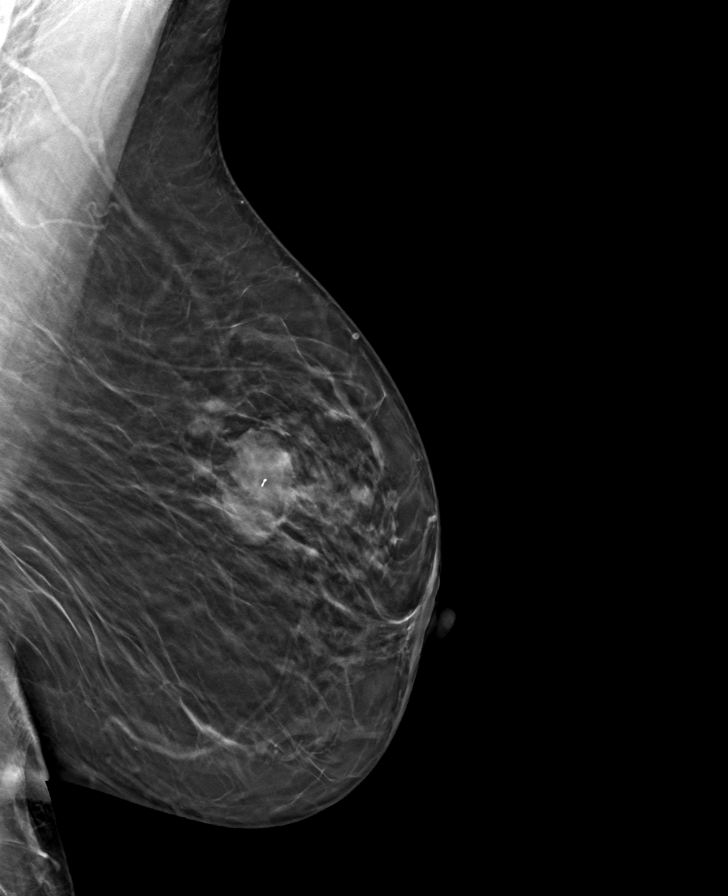
[frame 39/77]
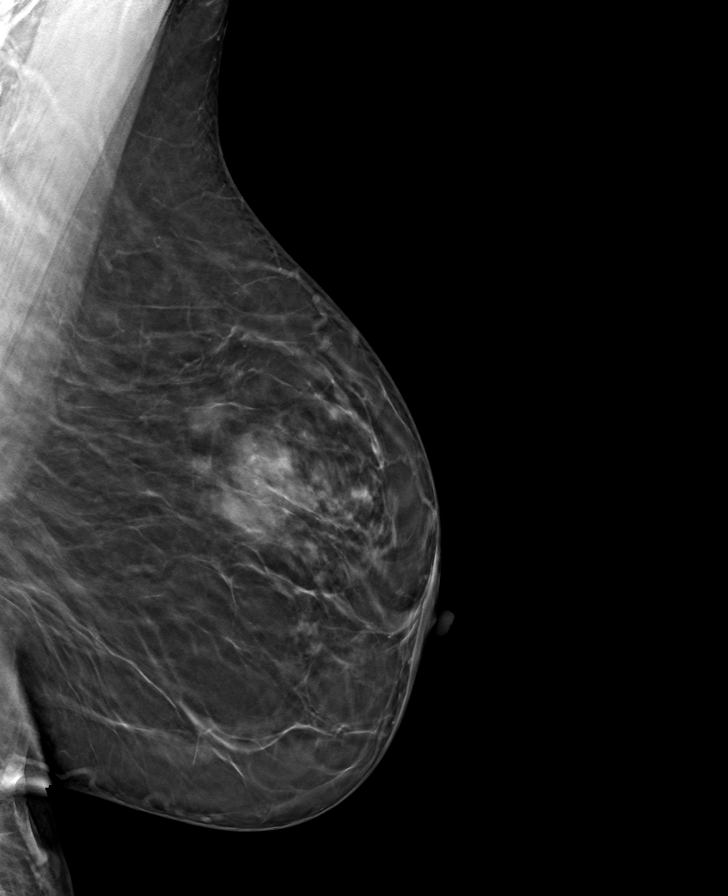
[frame 53/77]
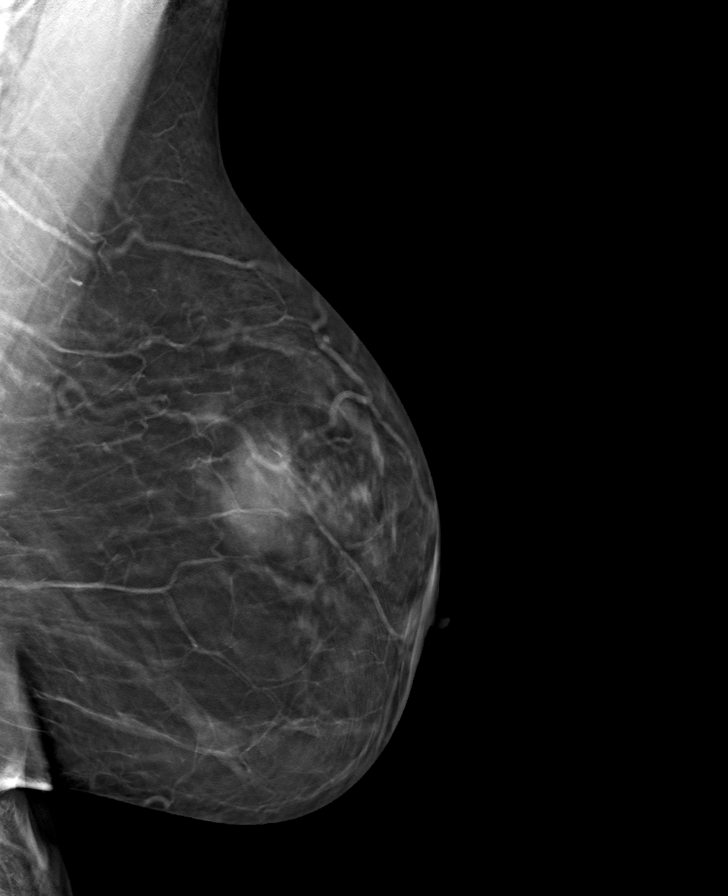

[L CC tomo · tomo slice 33/66.0]
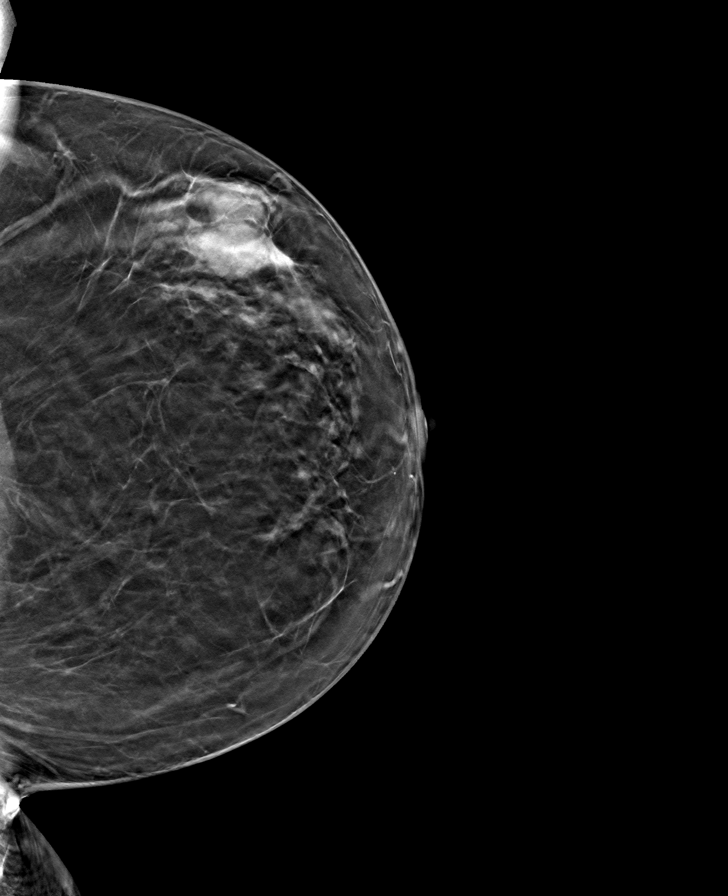

[L ML tomo · tomo slice 34/67.0]
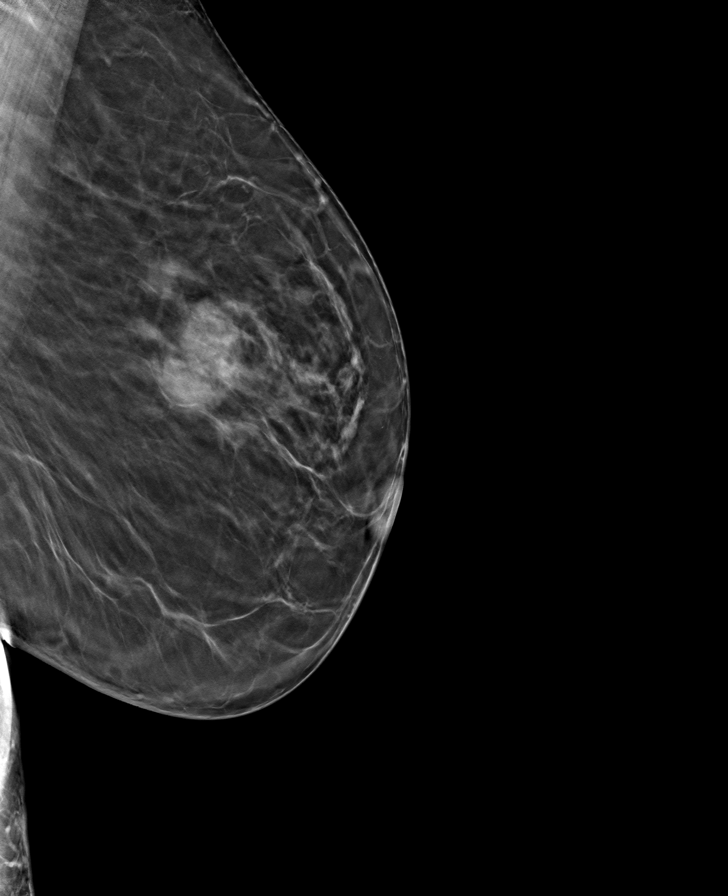

[8 of 18 positions shown; findings below may reference images not displayed]

ACR Breast Density Category b: There are scattered areas of
fibroglandular density.
FINDINGS: The biopsy clip lies within the anteromedial margin of the lateral
left breast mass, nearly abutting the medial margin of a second oval
mostly circumscribed mass that lies more laterally, with the 2
masses creating the appearance of either 2 contiguous masses or a
bilobed mass. The more lateral mass appears new from the pre biopsy
diagnostic mammogram.

There are no other masses. There is architectural distortion
adjacent to the biopsied/known left breast carcinoma. No other areas
of architectural distortion.

On physical exam, the biopsied mass in the upper outer left breast
creates a contour bulge and is readily palpable.

Targeted ultrasound is performed, showing the known left breast
carcinoma at 2 o'clock, 4 cm the nipple, measuring 2.1 x 1.5 x
cm. Directly adjacent to this mass is a complex cystic mass that
measures 1.4 x 1.2 x 1.5 cm, consistent with a small post biopsy
hematoma/seroma. There are no other masses visible the upper outer
or lateral left breast.

Sonographic evaluation of the left axilla shows a lymph node with an
eccentric cortical thickening, cortex measuring 5 mm. A second node
shows mild, but more diffuse lobular cortical prominence, maximum
2.9 mm.
IMPRESSION: 1. 2.1 cm in biopsy proven malignancy in the lateral left breast at
2 o'clock, 4 cm the nipple.
2. Directly adjacent cystic mass, 1.5 cm in size, consistent with a
post biopsy hematoma.
3. One abnormal and 1 borderline abnormal left axillary lymph node,
the more clearly abnormal node having an eccentric cortical
thickening of 5 mm.

RECOMMENDATION:
1. Treatment as planned for the known left breast carcinoma.
2. Ultrasound-guided core needle biopsy of the abnormal left
axillary lymph node should be considered prior to surgery.

I have discussed the findings and recommendations with the patient.
If applicable, a reminder letter will be sent to the patient
regarding the next appointment.

BI-RADS CATEGORY  6: Known biopsy-proven malignancy.

## 2021-05-12 IMAGING — US US BREAST*L* LIMITED INC AXILLA
1 series · 13 of 14 positions shown · non-contrast
Comparison: Outside exams from [DATE], [DATE] and
[DATE], as well as the procedure exam from [DATE].

CLINICAL DATA: Recent diagnosis of left breast carcinoma at an
outside institution. Questionable second mass noted on review of her
imaging. Patient presents for tomosynthesis imaging of the left
breast as well as ultrasound to assess for left axillary
lymphadenopathy.

EXAM:
DIGITAL DIAGNOSTIC UNILATERAL LEFT MAMMOGRAM WITH TOMOSYNTHESIS AND
CAD; ULTRASOUND LEFT BREAST LIMITED
TECHNIQUE: Left digital diagnostic mammography and breast tomosynthesis was
performed. The images were evaluated with computer-aided detection.;
Targeted ultrasound examination of the left breast was performed

[Series 1: us breast*left* limited inc axilla · 0.07mm/px · 13 of 14 slices shown]
[im 1/14]
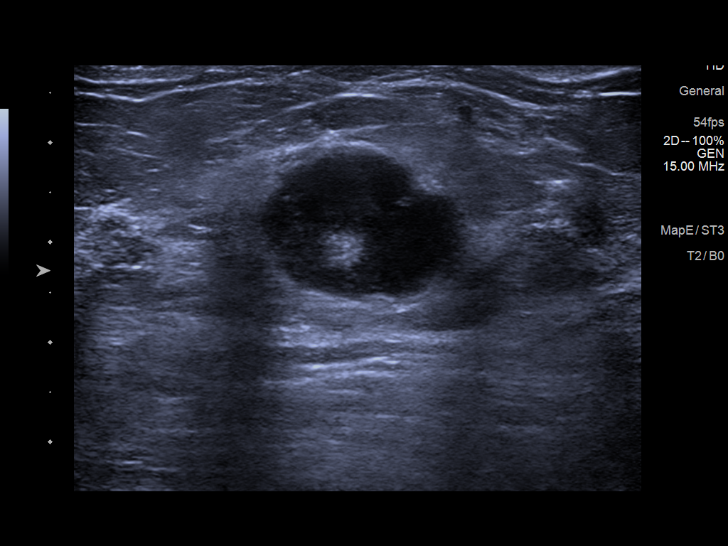
[im 2/14]
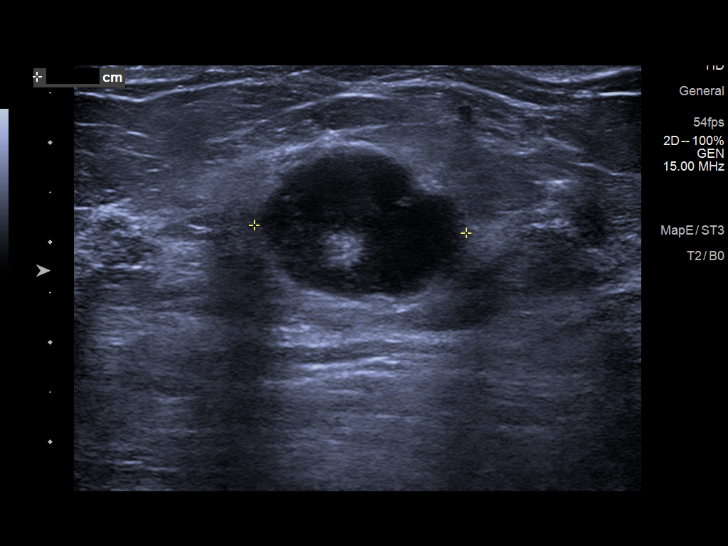
[im 3/14]
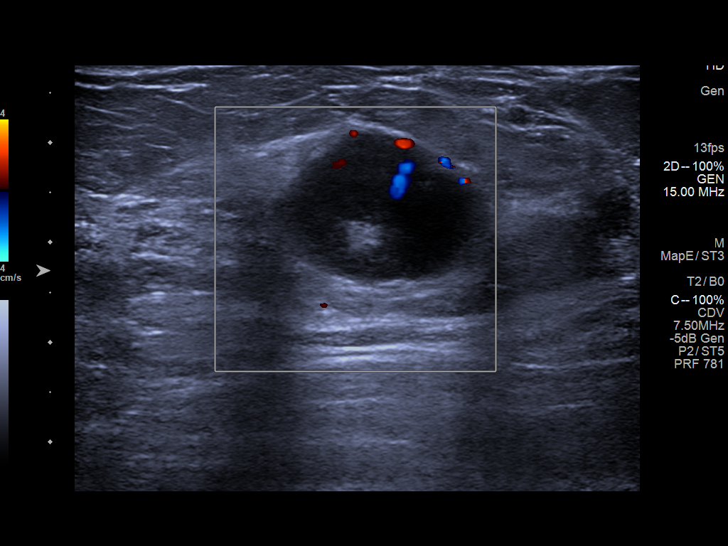
[im 4/14]
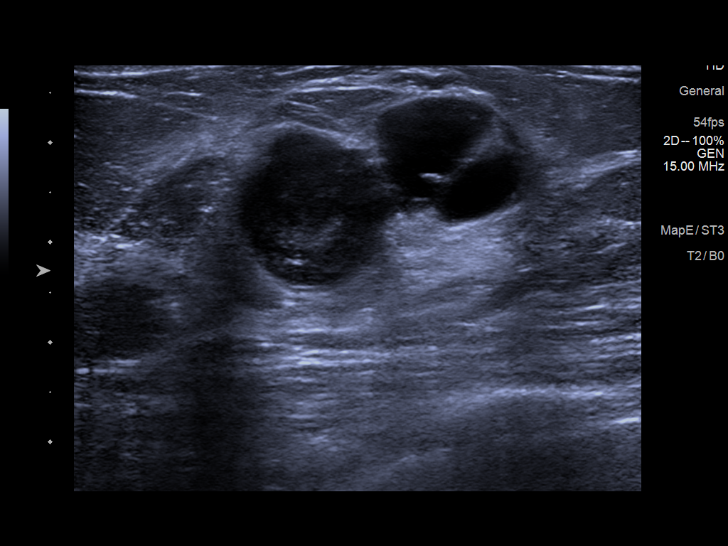
[im 5/14]
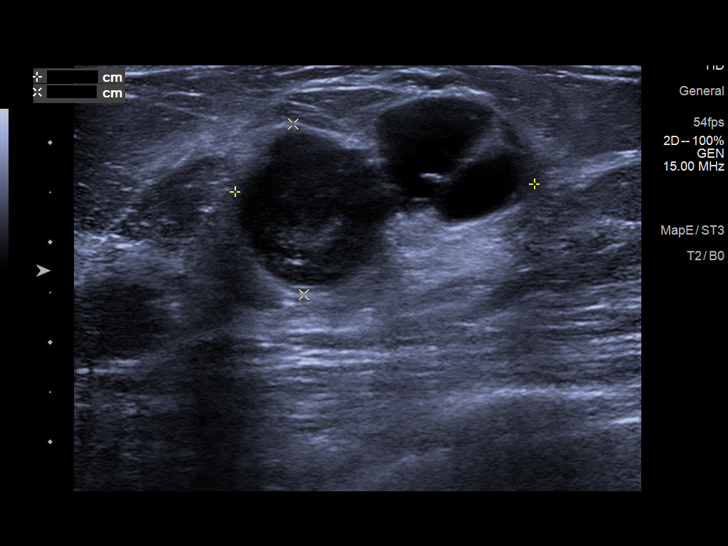
[im 6/14]
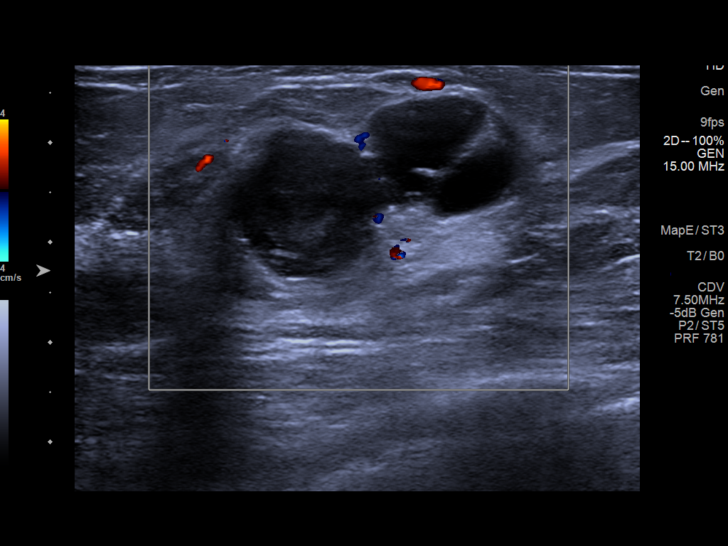
[im 8/14]
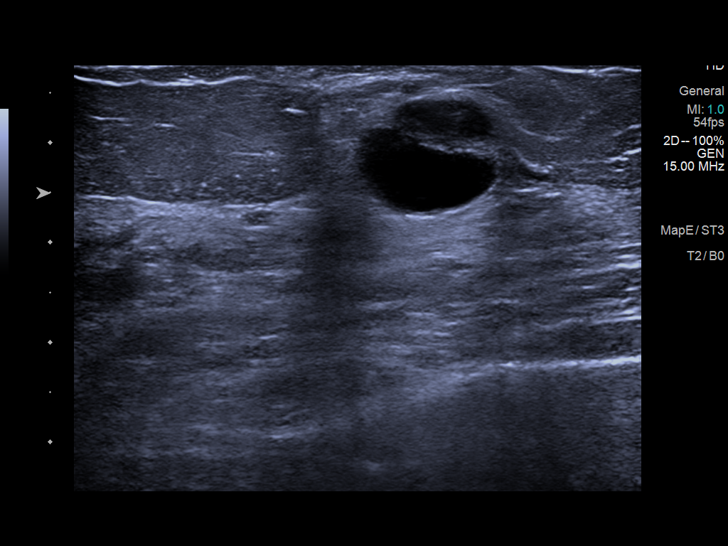
[im 9/14]
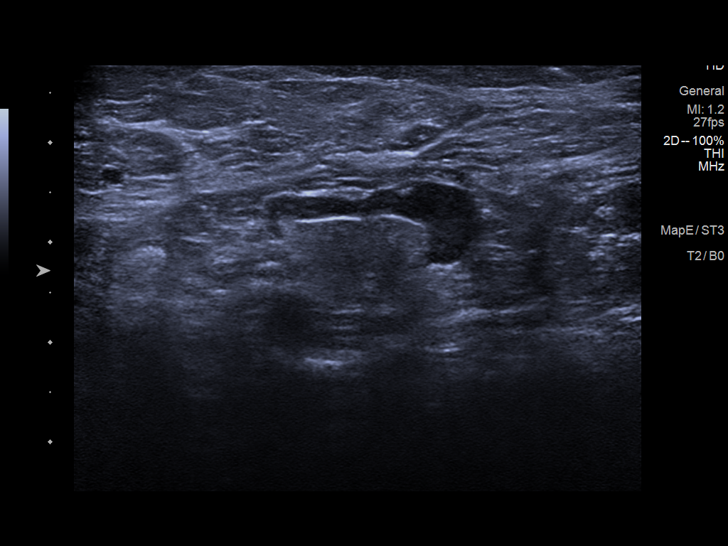
[im 10/14]
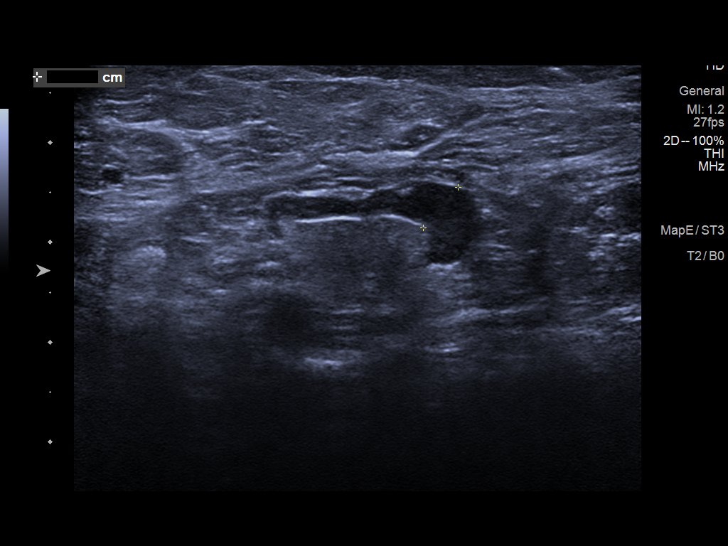
[im 11/14]
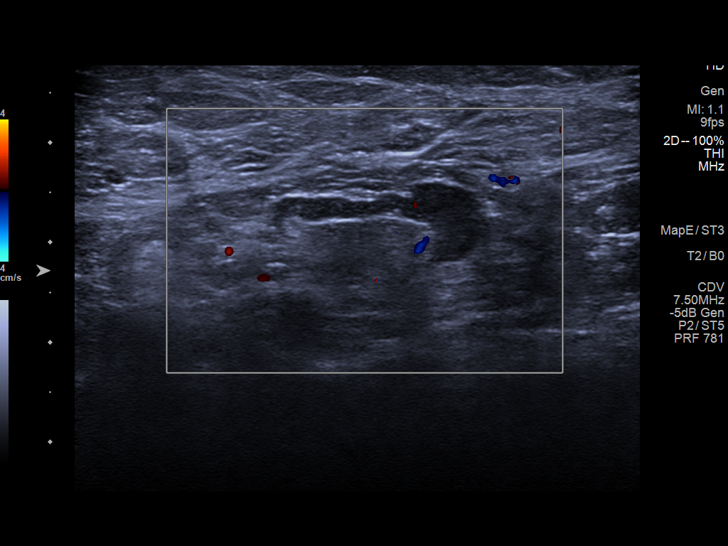
[im 12/14]
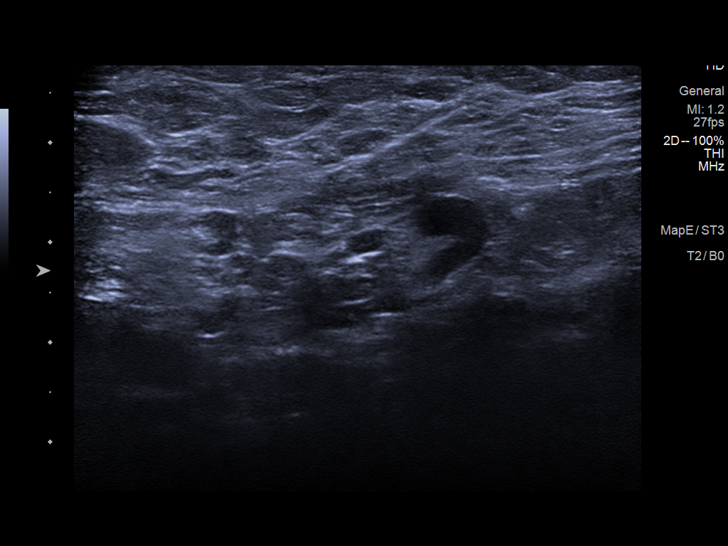
[im 13/14]
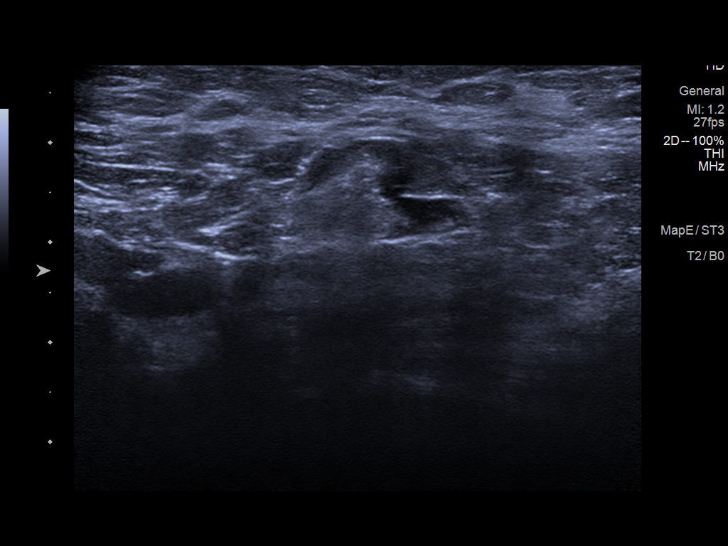
[im 14/14]
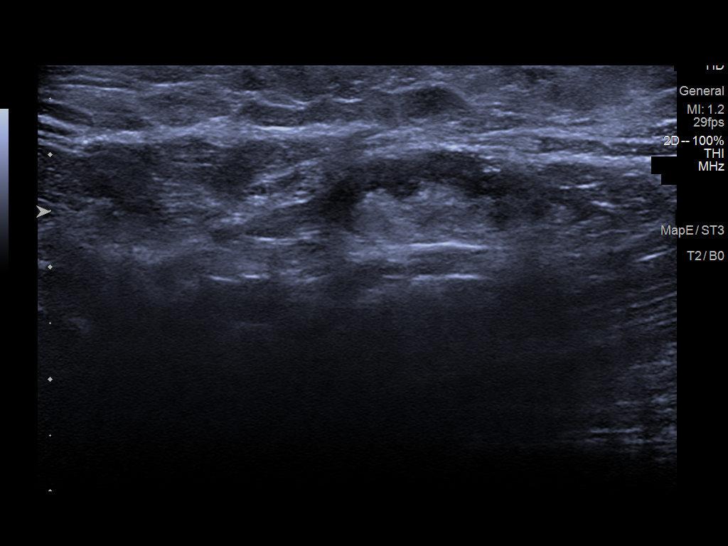

[13 of 14 positions shown; findings below may reference images not displayed]

ACR Breast Density Category b: There are scattered areas of
fibroglandular density.
FINDINGS: The biopsy clip lies within the anteromedial margin of the lateral
left breast mass, nearly abutting the medial margin of a second oval
mostly circumscribed mass that lies more laterally, with the 2
masses creating the appearance of either 2 contiguous masses or a
bilobed mass. The more lateral mass appears new from the pre biopsy
diagnostic mammogram.

There are no other masses. There is architectural distortion
adjacent to the biopsied/known left breast carcinoma. No other areas
of architectural distortion.

On physical exam, the biopsied mass in the upper outer left breast
creates a contour bulge and is readily palpable.

Targeted ultrasound is performed, showing the known left breast
carcinoma at 2 o'clock, 4 cm the nipple, measuring 2.1 x 1.5 x
cm. Directly adjacent to this mass is a complex cystic mass that
measures 1.4 x 1.2 x 1.5 cm, consistent with a small post biopsy
hematoma/seroma. There are no other masses visible the upper outer
or lateral left breast.

Sonographic evaluation of the left axilla shows a lymph node with an
eccentric cortical thickening, cortex measuring 5 mm. A second node
shows mild, but more diffuse lobular cortical prominence, maximum
2.9 mm.
IMPRESSION: 1. 2.1 cm in biopsy proven malignancy in the lateral left breast at
2 o'clock, 4 cm the nipple.
2. Directly adjacent cystic mass, 1.5 cm in size, consistent with a
post biopsy hematoma.
3. One abnormal and 1 borderline abnormal left axillary lymph node,
the more clearly abnormal node having an eccentric cortical
thickening of 5 mm.

RECOMMENDATION:
1. Treatment as planned for the known left breast carcinoma.
2. Ultrasound-guided core needle biopsy of the abnormal left
axillary lymph node should be considered prior to surgery.

I have discussed the findings and recommendations with the patient.
If applicable, a reminder letter will be sent to the patient
regarding the next appointment.

BI-RADS CATEGORY  6: Known biopsy-proven malignancy.

## 2021-05-12 NOTE — Telephone Encounter (Signed)
Pt scheduled for Mammo today (5/25) and Breast MRI on 5/31.

## 2021-05-13 ENCOUNTER — Encounter: Payer: Self-pay | Admitting: *Deleted

## 2021-05-13 NOTE — Progress Notes (Signed)
Patient called today.  We discussed transportation from [place] for multiple visits and the cost of gas.  Gas voucher offered.  Patient will pick up a voucher on Tuesday.  Verbal consent obtained for [facility] to contact patient.

## 2021-05-14 ENCOUNTER — Ambulatory Visit
Admission: RE | Admit: 2021-05-14 | Discharge: 2021-05-14 | Disposition: A | Discharge status: Home / Self Care | Source: Ambulatory Visit | Attending: Oncology | Admitting: Oncology

## 2021-05-14 ENCOUNTER — Other Ambulatory Visit: Payer: Self-pay

## 2021-05-14 ENCOUNTER — Telehealth: Payer: Self-pay

## 2021-05-14 DIAGNOSIS — C50412 Malignant neoplasm of upper-outer quadrant of left female breast: Secondary | ICD-10-CM

## 2021-05-14 DIAGNOSIS — Z0189 Encounter for other specified special examinations: Secondary | ICD-10-CM | POA: Diagnosis not present

## 2021-05-14 DIAGNOSIS — I071 Rheumatic tricuspid insufficiency: Secondary | ICD-10-CM | POA: Diagnosis not present

## 2021-05-14 DIAGNOSIS — Z171 Estrogen receptor negative status [ER-]: Secondary | ICD-10-CM

## 2021-05-14 DIAGNOSIS — Z01818 Encounter for other preprocedural examination: Secondary | ICD-10-CM | POA: Insufficient documentation

## 2021-05-14 LAB — ECHOCARDIOGRAM LIMITED
AR max vel: 2.82 cm2
AV Area VTI: 2.74 cm2
AV Area mean vel: 2.64 cm2
AV Mean grad: 3 mmHg
AV Peak grad: 5.7 mmHg
Ao pk vel: 1.19 m/s
Area-P 1/2: 5.2 cm2
S' Lateral: 2.68 cm

## 2021-05-14 NOTE — Telephone Encounter (Signed)
-----   Message from Earlie Server, MD sent at 05/14/2021  8:26 AM EDT ----- She needs US guided biopsy of axillary LN.  MRI is 5/31. Will see if any additional mass will be detected.

## 2021-05-14 NOTE — Progress Notes (Signed)
*  PRELIMINARY RESULTS* Echocardiogram 2D Echocardiogram has been performed.  Sherrie Sport 05/14/2021, 10:11 AM

## 2021-05-14 NOTE — Telephone Encounter (Addendum)
Please call [facility] to schedule bx and inform patient of appt details.

## 2021-05-18 ENCOUNTER — Other Ambulatory Visit: Payer: Self-pay

## 2021-05-18 ENCOUNTER — Other Ambulatory Visit: Payer: Self-pay | Admitting: Oncology

## 2021-05-18 ENCOUNTER — Ambulatory Visit
Admission: RE | Admit: 2021-05-18 | Discharge: 2021-05-18 | Disposition: A | Discharge status: Home / Self Care | Source: Ambulatory Visit | Attending: Oncology | Admitting: Oncology

## 2021-05-18 DIAGNOSIS — Z171 Estrogen receptor negative status [ER-]: Secondary | ICD-10-CM | POA: Diagnosis present

## 2021-05-18 DIAGNOSIS — R928 Other abnormal and inconclusive findings on diagnostic imaging of breast: Secondary | ICD-10-CM

## 2021-05-18 DIAGNOSIS — C50412 Malignant neoplasm of upper-outer quadrant of left female breast: Secondary | ICD-10-CM | POA: Diagnosis not present

## 2021-05-18 IMAGING — MR MR BREAST BILAT WO/W CM
2 of 8 series · 12 of 48 positions shown · IV contrast (10ml Gadavist)
Comparison: [DATE] mammogram and ultrasound. Prior outside
mammograms and ultrasounds.

CLINICAL DATA: 54-year-old female with new biopsy-proven UPPER
OUTER LEFT breast malignancy. Staging and evaluation of extent.

LABS:  None performed today
EXAM:
BILATERAL BREAST MRI WITH AND WITHOUT CONTRAST
TECHNIQUE: Multiplanar, multisequence MR images of both breasts were obtained
prior to and following the intravenous administration of 10 ml of
Gadavist

[Series 2: T1 · axial · B · 1.5mm · 1.02mm/px · z∈[-76,+90]mm · 8 of 112 slices shown]
[im 1/112]
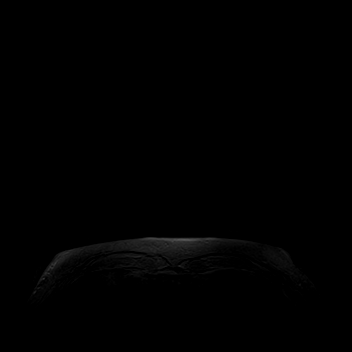
[im 16/112]
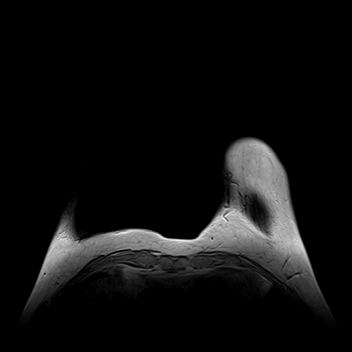
[im 32/112]
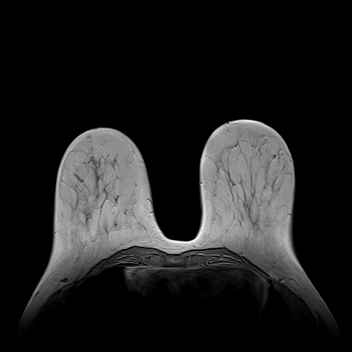
[im 48/112]
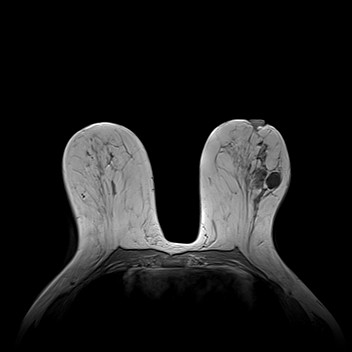
[im 64/112]
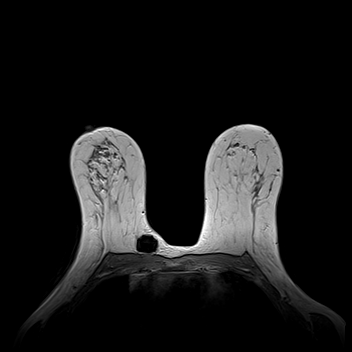
[im 80/112]
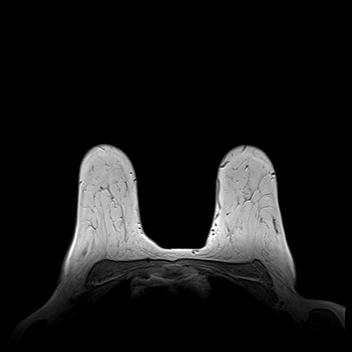
[im 96/112]
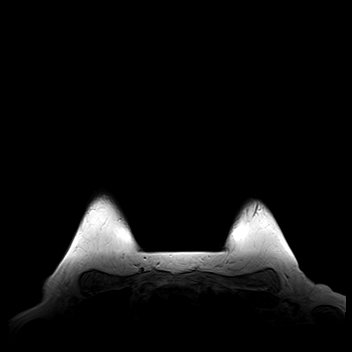
[im 112/112]
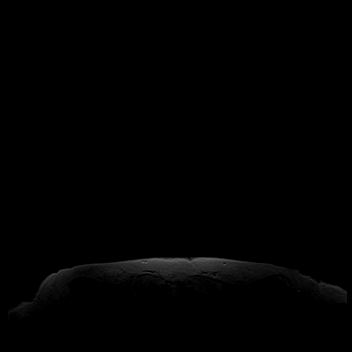

[Series 3: T2 · axial · B · 3.0mm · 1.02mm/px · z∈[-74,+88]mm · 4 of 46 slices shown]
[im 1/46]
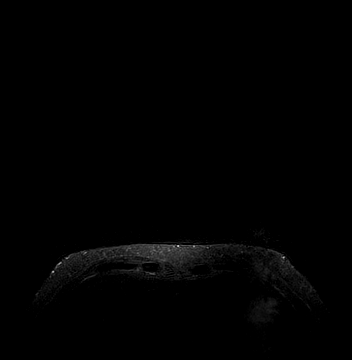
[im 16/46]
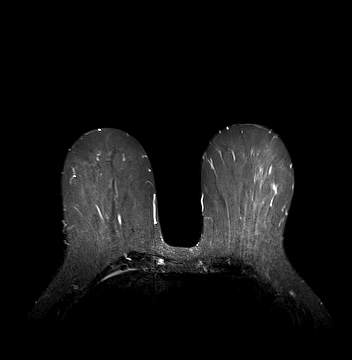
[im 31/46]
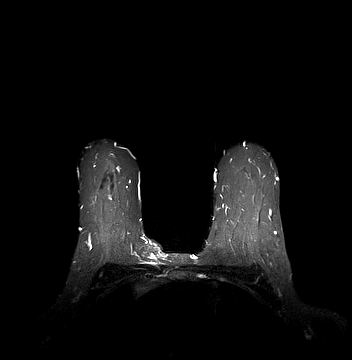
[im 46/46]
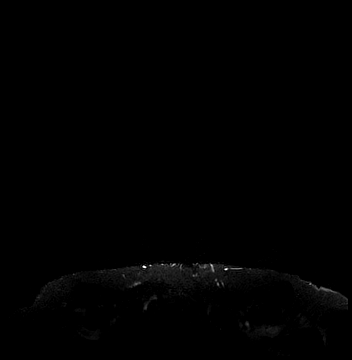

[12 of 48 positions shown; findings below may reference images not displayed]

Three-dimensional MR images were rendered by post-processing of the
original MR data on an independent workstation. The
three-dimensional MR images were interpreted, and findings are
reported in the following complete MRI report for this study. Three
dimensional images were evaluated at the independent interpreting
workstation using the DynaCAD thin client.
FINDINGS: Breast composition: b. Scattered fibroglandular tissue.

Background parenchymal enhancement: Mild

Right breast: No mass or suspicious enhancement. A Port-A-Cath is
identified within the high UPPER INNER breast.

Left breast: A 2.5 x 3 x 2.5 cm (AP x transverse x CC) UPPER-OUTER
LEFT breast bilobed structure with rim and adjacent enhancement
contains biopsy clip artifact within the MEDIAL component and likely
represents post biopsy hematoma.

A 1.4 x 1 x 1.6 cm area of nodular non masslike enhancement located
2.5 cm posterior/superior to the biopsy clip artifact (series 11:
Images [id number]).

It is difficult to determine where the biopsy-proven malignancy is
located and may lie within the region of the post biopsy hematoma or
may be represented by the 1.4 x 1 x 1.6 cm nodular non masslike
enhancement. Regardless, the nodular non masslike enhancement is
worrisome for malignancy.

The entire area including probable hematoma, biopsy clip artifact
and nodular non masslike enhancements measures up to 4 cm in
greatest diameter.

No other suspicious abnormalities within the LEFT breast are
identified.

Lymph nodes: A single LEFT axillary lymph node with eccentric
cortical thickening up to 0.5 cm is noted.

No other abnormal lymph nodes are identified.

Ancillary findings:  None.
IMPRESSION: 1. 2.5 x 3 x 2.5 cm probable hematoma containing biopsy clip
artifact within the UPPER OUTER LEFT breast in the region of
biopsy-proven malignancy. 1.6 cm nodular non masslike enhancement
2.5 cm posterior/superior to the biopsy clip may represent the
biopsy-proven malignancy or additional area of malignancy, as
discussed above. When appropriate, surgical removal of the entire
area is recommended, which measures up to 4 cm in greatest diameter.
2. Single abnormal appearing LEFT axillary lymph node with eccentric
cortical thickening. Consider tissue sampling as indicated.
3. No MR evidence of RIGHT breast malignancy.

RECOMMENDATION:
Ultrasound-guided LEFT axillary lymph node biopsy as indicated.
Otherwise treatment plan

BI-RADS CATEGORY  6: Known biopsy-proven malignancy.

## 2021-05-18 MED ORDER — GADOBUTROL 1 MMOL/ML IV SOLN
10.0000 mL | Freq: Once | INTRAVENOUS | Status: AC | PRN
  Administered 2021-05-18: 10 mL via INTRAVENOUS

## 2021-05-19 ENCOUNTER — Inpatient Hospital Stay

## 2021-05-20 ENCOUNTER — Other Ambulatory Visit: Payer: Self-pay

## 2021-05-20 ENCOUNTER — Ambulatory Visit
Admission: RE | Admit: 2021-05-20 | Discharge: 2021-05-20 | Disposition: A | Discharge status: Home / Self Care | Source: Ambulatory Visit | Attending: Oncology | Admitting: Oncology

## 2021-05-20 DIAGNOSIS — R928 Other abnormal and inconclusive findings on diagnostic imaging of breast: Secondary | ICD-10-CM

## 2021-05-20 HISTORY — PX: BREAST BIOPSY: SHX20

## 2021-05-20 LAB — SLIDE CONSULT, PATHOLOGY ARMC

## 2021-05-20 IMAGING — MG MM BREAST LOCALIZATION CLIP
6 series · 6 of 18 positions shown · non-contrast
Comparison: Previous exam(s).

CLINICAL DATA: Status post ultrasound-guided core needle biopsy of
a left axillary lymph node with focal, eccentric cortical thickening
suspicious for a metastatic node. Recently diagnosed left breast
cancer.

EXAM:
DIAGNOSTIC LEFT MAMMOGRAM POST ULTRASOUND BIOPSY

[L MLO synth-2D (1 of 2)]
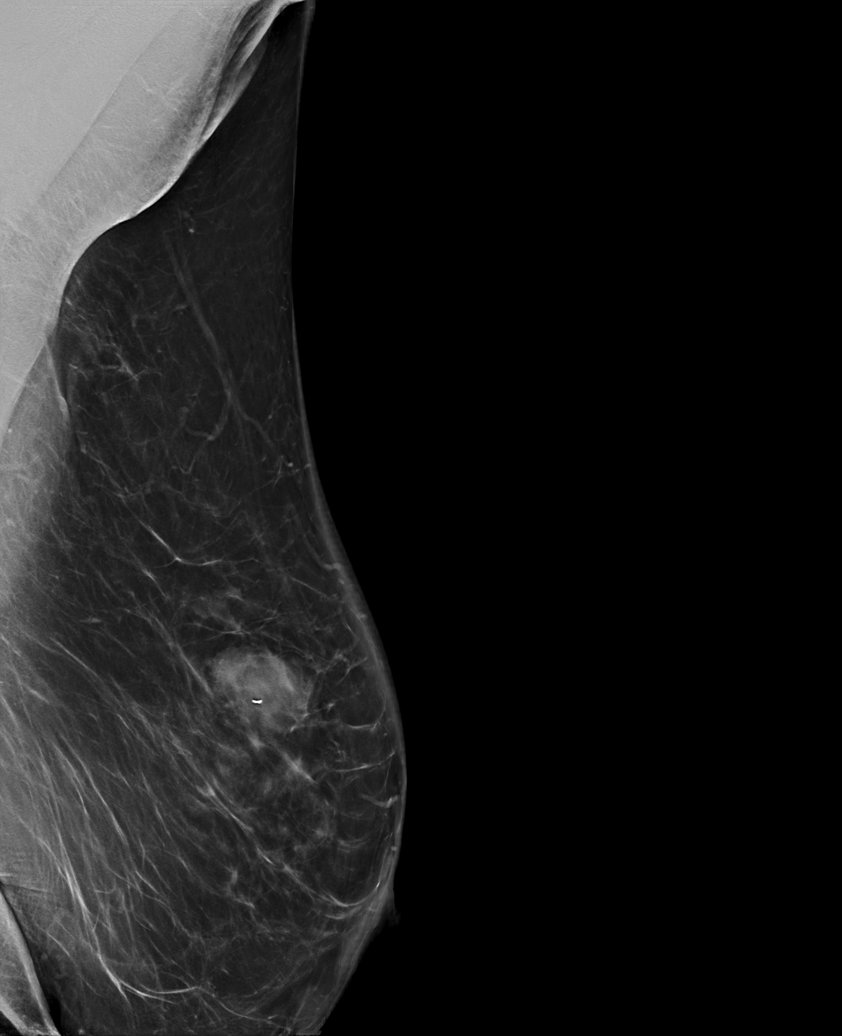

[L LM synth-2D]
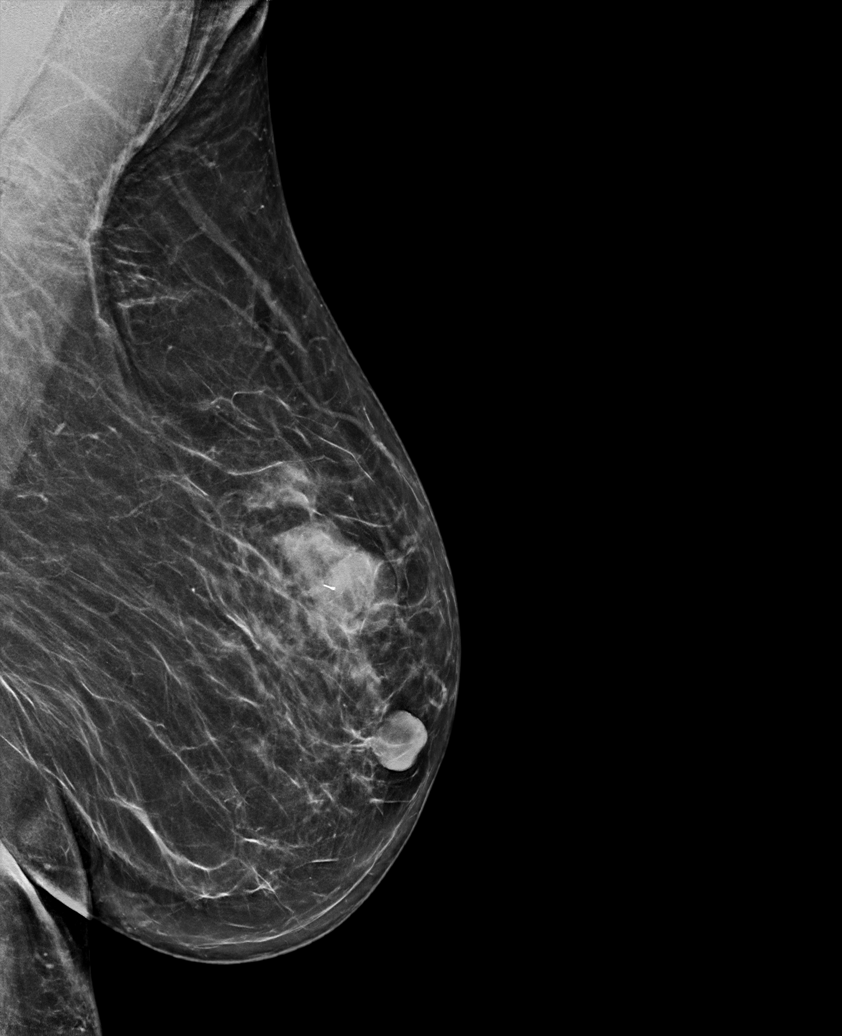

[L MLO synth-2D (2 of 2)]
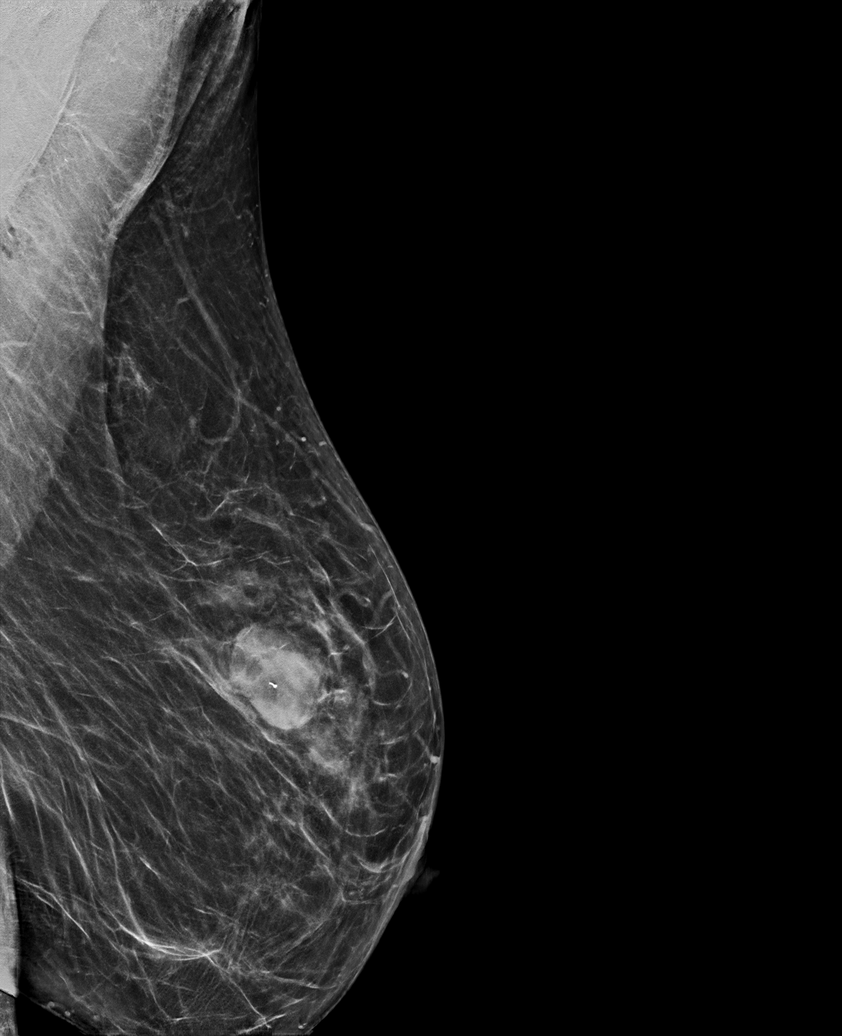

[L LM tomo · tomo slice 36/71.0]
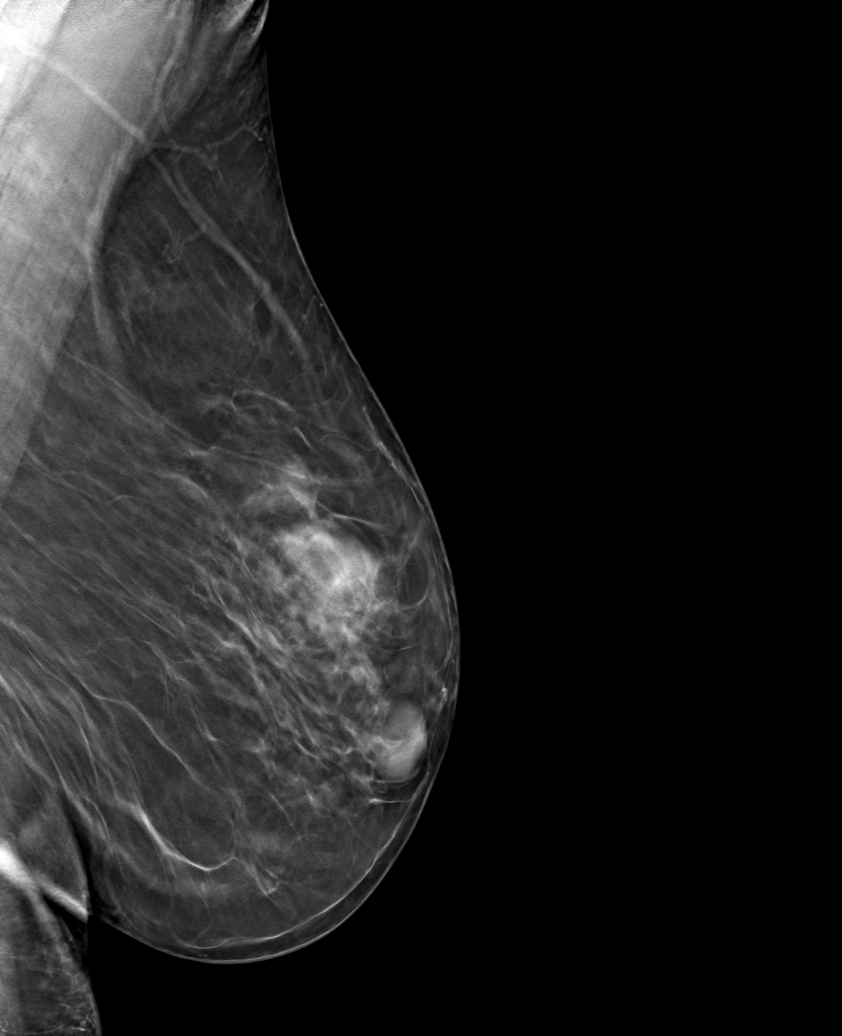

[L MLO tomo (1 of 2) · tomo slice 47/92.0]
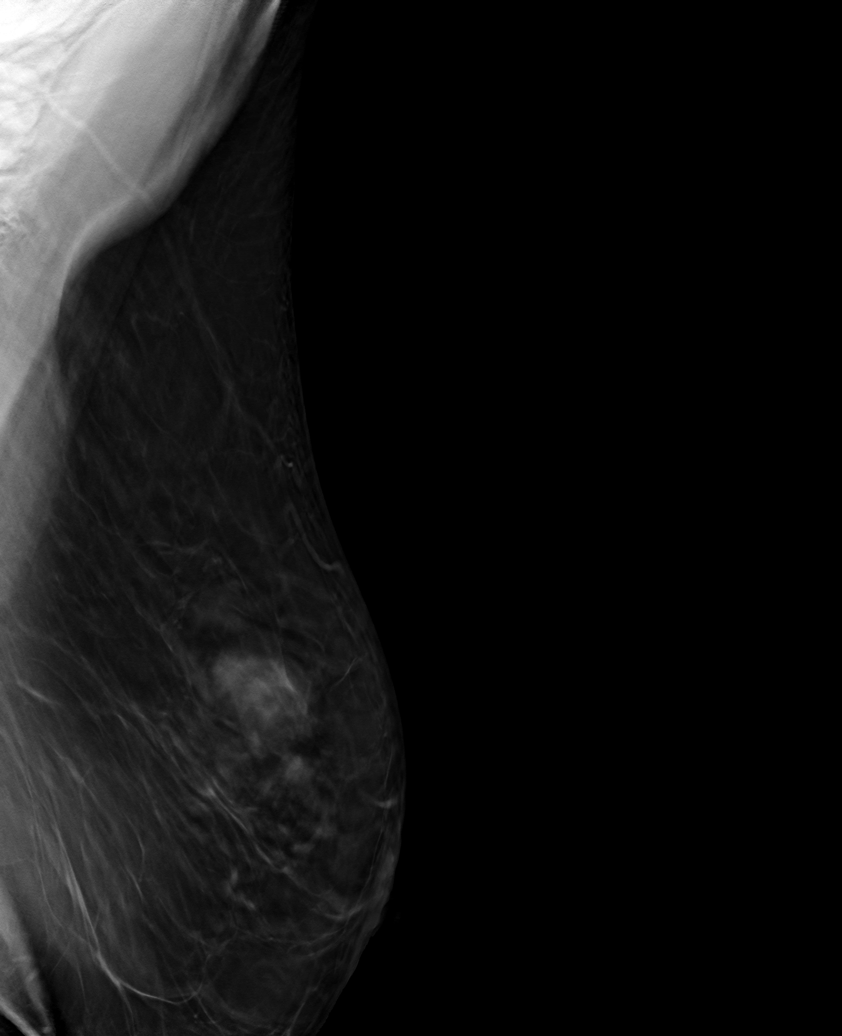

[L MLO tomo (2 of 2) · tomo slice 36/71.0]
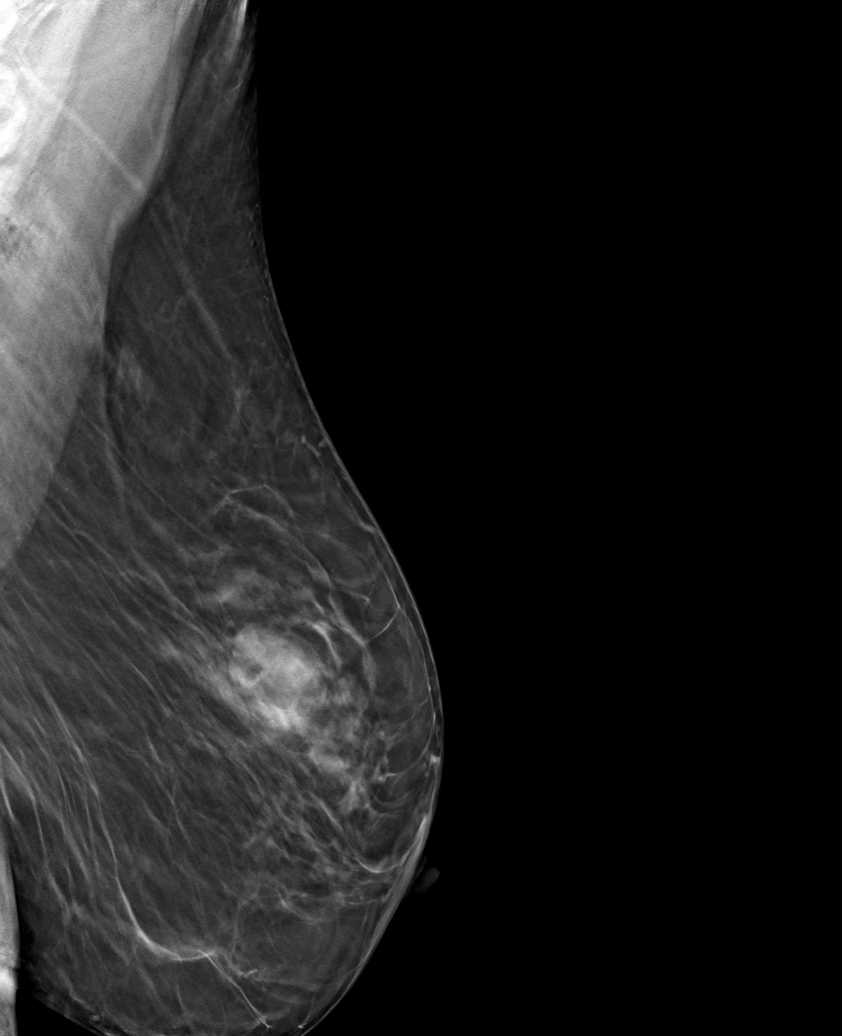

[6 of 18 positions shown; findings below may reference images not displayed]

FINDINGS: Mammographic images were obtained following ultrasound guided biopsy
of the recently demonstrated abnormal appearing left axillary lymph
node suspicious for a metastatic node. The biopsy marking clip could
not be included on the images due to its far posterior location in
the axilla. The spiral shaped HydroMARK biopsy marker clip was well
visualized within the biopsied cortex of the lymph node at
ultrasound. A previously placed linear biopsy marker clip is noted
within and the previously biopsied mass/subsequent hematoma in upper
outer left breast.
IMPRESSION: Appropriate positioning of the spiral HydroMARK shaped biopsy
marking clip at the site of biopsy in the biopsied left axillary
lymph node at ultrasound. This could not be included on the
mammogram images.

Final Assessment: Post Procedure Mammograms for Marker Placement

## 2021-05-20 IMAGING — MG US BREAST BX W LOC DEV 1ST LESION IMG BX SPEC US GUIDE*L*
1 series · 5 of 8 positions shown · non-contrast
Comparison: Previous exam(s).
COMPARISON: Previous exam(s).
COMPARISON: Previous exam(s).
COMPARISON: Previous exam(s).

Addendum:
CLINICAL DATA: Left axillary lymph node with focal, eccentric
cortical thickening suspicious for a metastatic lymph node at recent
ultrasound and MRI. Recently diagnosed left breast cancer.

EXAM:
ULTRASOUND GUIDED LEFT AXILLARY CORE NEEDLE BIOPSY

[Series 1: MG view · 0.06mm/px · 5 of 19 slices shown]
[im 1/19]
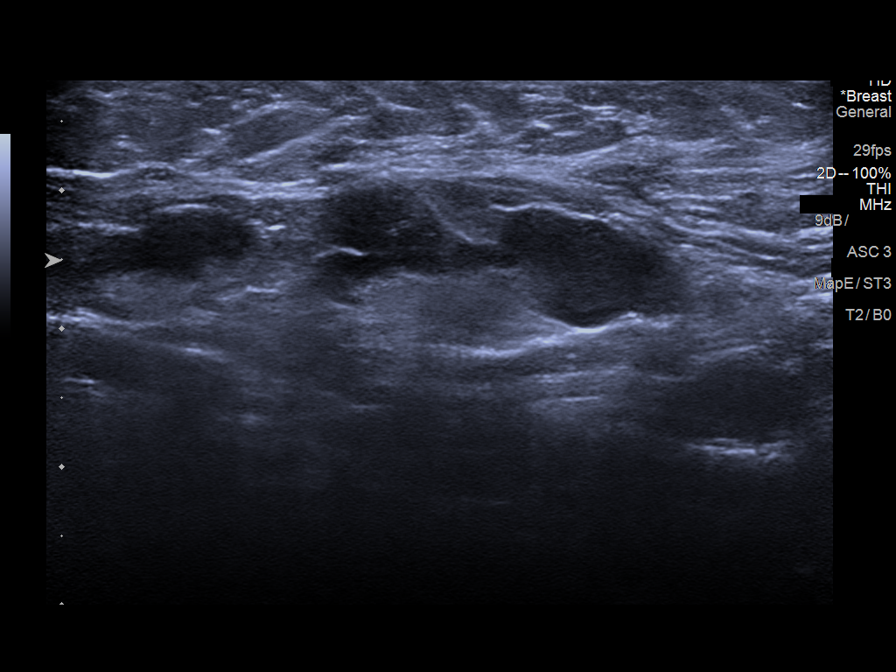
[im 3/19]
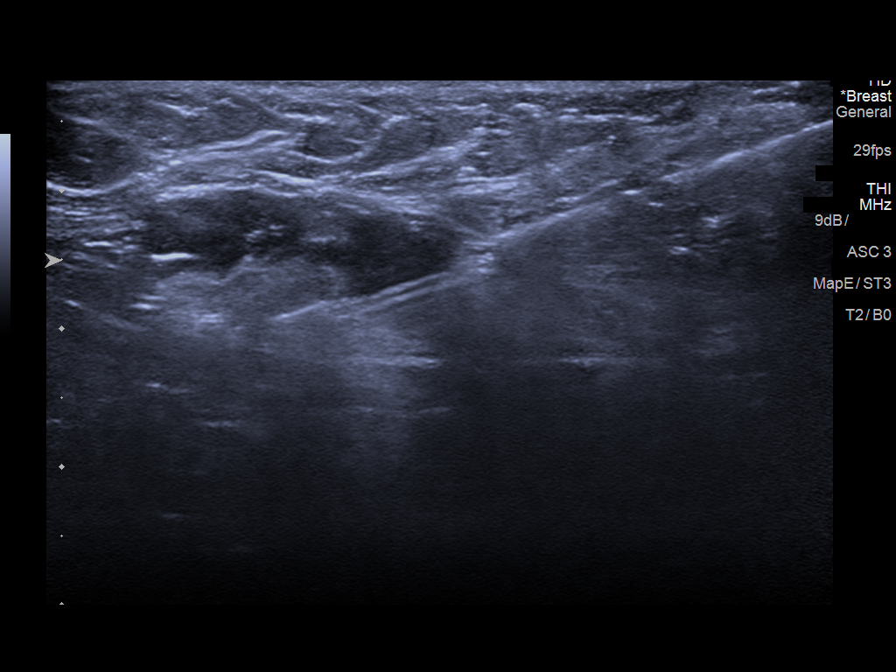
[im 6/19]
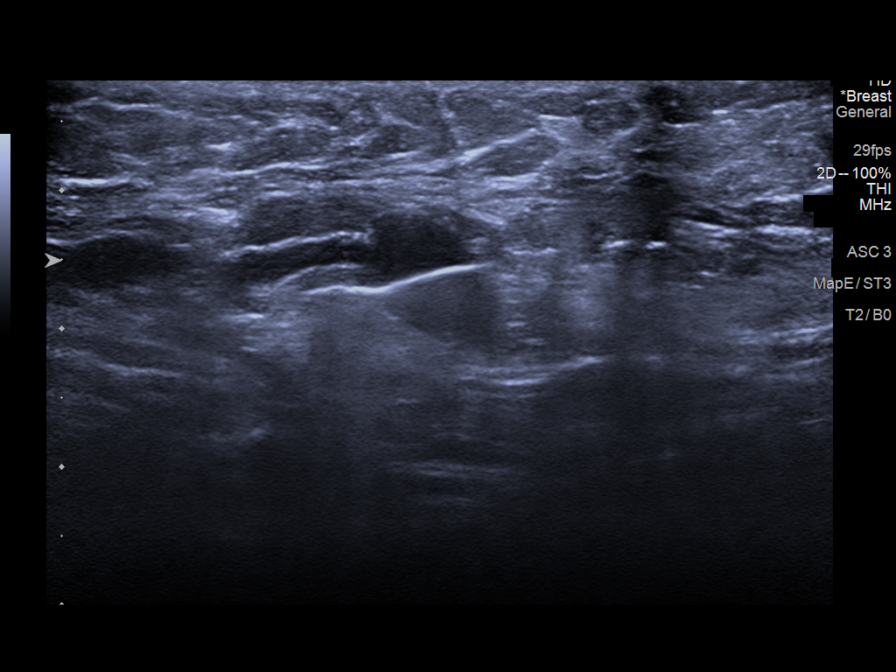
[im 8/19]
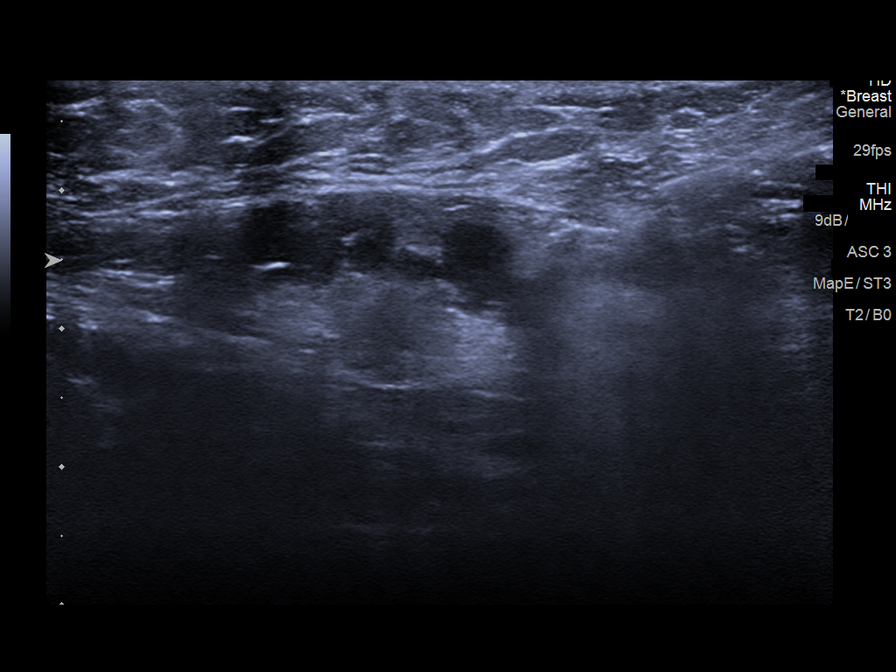
[im 11/19]
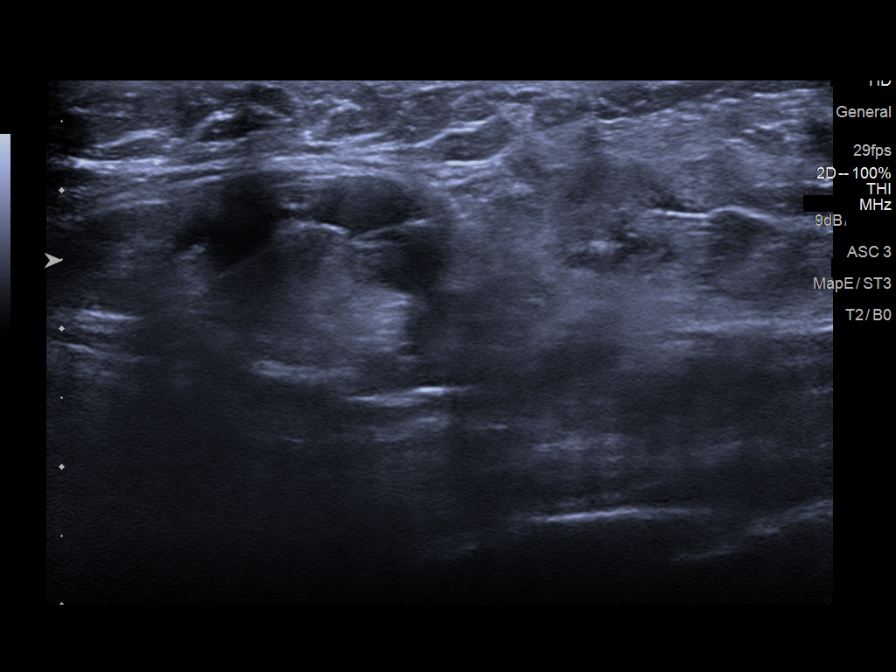

[5 of 8 positions shown; findings below may reference images not displayed]



Using sterile technique and 1% Lidocaine as local anesthetic, under
direct ultrasound visualization, a 14 gauge ROSCA device was
used to perform biopsy of the recently demonstrated left axillary
lymph node with focal, eccentric cortical thickening using a caudal
approach. At the conclusion of the procedure a spiral shaped
HydroMARK tissue marker clip was deployed into the biopsy cavity.
This was well seen within the biopsied lymph node at ultrasound.
Follow up 2 view mammogram was performed and dictated separately.
IMPRESSION: Ultrasound guided biopsy of the recently demonstrated left axillary
lymph node with focal, eccentric cortical thickening. No apparent
complications.

ADDENDUM:
PATHOLOGY revealed: A. LYMPH NODE, LEFT AXILLARY; ULTRASOUND-GUIDED
BIOPSY: - NEGATIVE; NO EVIDENCE OF MALIGNANCY. - FRAGMENT OF BENIGN
LYMPH NODE TISSUE. Comment: Immunohistochemical stain for
pancytokeratin is negative. Stains for kappa and lambda demonstrate
polytypic plasma cells.

Pathology results are CONCORDANT with imaging findings, per Dr.
ROSCA.

Pathology results and recommendations were discussed with patient
via telephone on [DATE]. Patient reported doing well after the
biopsy with no adverse symptoms, and only slight tenderness at the
site. Post biopsy care instructions were reviewed, questions were
answered and my direct phone number was provided. Patient was
instructed to call [HOSPITAL] for any additional
questions or concerns related to biopsy site.

Recommendation: Patient instructed to continue current treatment
plan for recently diagnosed LEFT breast cancer.

Pathology results reported by ROSCA RN on [DATE].

ADDENDUM:
Dr. ROSCA called today to review the patient's imaging. The patient has
a triple negative breast cancer, and her biopsied lymph node is
negative despite the abnormal appearance on ultrasound. She stated
that the patient would need additional chemotherapy treatment if the
cancer is over 2 cm (which it appears to be on MRI), and/or if she
has a positive lymph node. After reviewing the imaging, it appears
that the lymph node that was biopsied (although it did appear
abnormal) may not be the same abnormal lymph node that was scanned
at the time of our diagnostic workup. The quickest solution would be
to repeat an ultrasound guided biopsy of the most abnormal-appearing
lymph node in the left axilla. If the second biopsied left axillary
lymph node is also negative, then MRI guided biopsy of the posterior
aspect of the nodular enhancement, which extends posterior to the
dominant mass could be performed. This nodular enhancement together
with the biopsy-proven cancer measures at least 4.4 cm in an oblique
projection on the MRI.

The potential ultrasound-guided biopsy has been scheduled for
[DATE] at 1pm.

ADDENDUM:
Update/correction to the addendum:

Upon further review of the images, the lymph node in the diagnostic
exam and the biopsied lymph node do appear to be the same, and
therefore the patient should proceed with MRI guided biopsy of the
posterior non mass enhancement in the left breast to determine
extent of disease. The ultrasound biopsy for this [REDACTED] has been
canceled. I have informed Dr. ROSCA of the update and she is working on
scheduling the patient for a MRI guided biopsy of the left breast.

*** End of Addendum ***
Addendum:
PROCEDURE:
I met with the patient and we discussed the procedure of
ultrasound-guided biopsy, including benefits and alternatives. We
discussed the high likelihood of a successful procedure. We
discussed the risks of the procedure, including infection, bleeding,
tissue injury, clip migration, and inadequate sampling. Informed
written consent was given. The usual time-out protocol was performed
immediately prior to the procedure.

Using sterile technique and 1% Lidocaine as local anesthetic, under
direct ultrasound visualization, a 14 gauge ROSCA device was
used to perform biopsy of the recently demonstrated left axillary
lymph node with focal, eccentric cortical thickening using a caudal
approach. At the conclusion of the procedure a spiral shaped
HydroMARK tissue marker clip was deployed into the biopsy cavity.
This was well seen within the biopsied lymph node at ultrasound.
Follow up 2 view mammogram was performed and dictated separately.
IMPRESSION: Ultrasound guided biopsy of the recently demonstrated left axillary
lymph node with focal, eccentric cortical thickening. No apparent
complications.

ADDENDUM:
PATHOLOGY revealed: A. LYMPH NODE, LEFT AXILLARY; ULTRASOUND-GUIDED
BIOPSY: - NEGATIVE; NO EVIDENCE OF MALIGNANCY. - FRAGMENT OF BENIGN
LYMPH NODE TISSUE. Comment: Immunohistochemical stain for
pancytokeratin is negative. Stains for kappa and lambda demonstrate
polytypic plasma cells.

Pathology results are CONCORDANT with imaging findings, per Dr.
ROSCA.

Pathology results and recommendations were discussed with patient
via telephone on [DATE]. Patient reported doing well after the
biopsy with no adverse symptoms, and only slight tenderness at the
site. Post biopsy care instructions were reviewed, questions were
answered and my direct phone number was provided. Patient was
instructed to call [HOSPITAL] for any additional
questions or concerns related to biopsy site.

Recommendation: Patient instructed to continue current treatment
plan for recently diagnosed LEFT breast cancer.

Pathology results reported by ROSCA RN on [DATE].

ADDENDUM:
Dr. ROSCA called today to review the patient's imaging. The patient has
a triple negative breast cancer, and her biopsied lymph node is
negative despite the abnormal appearance on ultrasound. She stated
that the patient would need additional chemotherapy treatment if the
cancer is over 2 cm (which it appears to be on MRI), and/or if she
has a positive lymph node. After reviewing the imaging, it appears
that the lymph node that was biopsied (although it did appear
abnormal) may not be the same abnormal lymph node that was scanned
at the time of our diagnostic workup. The quickest solution would be
to repeat an ultrasound guided biopsy of the most abnormal-appearing
lymph node in the left axilla. If the second biopsied left axillary
lymph node is also negative, then MRI guided biopsy of the posterior
aspect of the nodular enhancement, which extends posterior to the
dominant mass could be performed. This nodular enhancement together
with the biopsy-proven cancer measures at least 4.4 cm in an oblique
projection on the MRI.

The potential ultrasound-guided biopsy has been scheduled for
[DATE] at 1pm.

*** End of Addendum ***
Addendum:
PROCEDURE:
I met with the patient and we discussed the procedure of
ultrasound-guided biopsy, including benefits and alternatives. We
discussed the high likelihood of a successful procedure. We
discussed the risks of the procedure, including infection, bleeding,
tissue injury, clip migration, and inadequate sampling. Informed
written consent was given. The usual time-out protocol was performed
immediately prior to the procedure.

Using sterile technique and 1% Lidocaine as local anesthetic, under
direct ultrasound visualization, a 14 gauge ROSCA device was
used to perform biopsy of the recently demonstrated left axillary
lymph node with focal, eccentric cortical thickening using a caudal
approach. At the conclusion of the procedure a spiral shaped
HydroMARK tissue marker clip was deployed into the biopsy cavity.
This was well seen within the biopsied lymph node at ultrasound.
Follow up 2 view mammogram was performed and dictated separately.
IMPRESSION: Ultrasound guided biopsy of the recently demonstrated left axillary
lymph node with focal, eccentric cortical thickening. No apparent
complications.

ADDENDUM:
PATHOLOGY revealed: A. LYMPH NODE, LEFT AXILLARY; ULTRASOUND-GUIDED
BIOPSY: - NEGATIVE; NO EVIDENCE OF MALIGNANCY. - FRAGMENT OF BENIGN
LYMPH NODE TISSUE. Comment: Immunohistochemical stain for
pancytokeratin is negative. Stains for kappa and lambda demonstrate
polytypic plasma cells.

Pathology results are CONCORDANT with imaging findings, per Dr.
ROSCA.

Pathology results and recommendations were discussed with patient
via telephone on [DATE]. Patient reported doing well after the
biopsy with no adverse symptoms, and only slight tenderness at the
site. Post biopsy care instructions were reviewed, questions were
answered and my direct phone number was provided. Patient was
instructed to call [HOSPITAL] for any additional
questions or concerns related to biopsy site.

Recommendation: Patient instructed to continue current treatment
plan for recently diagnosed LEFT breast cancer.

Pathology results reported by ROSCA RN on [DATE].



Using sterile technique and 1% Lidocaine as local anesthetic, under
direct ultrasound visualization, a 14 gauge ROSCA device was
used to perform biopsy of the recently demonstrated left axillary
lymph node with focal, eccentric cortical thickening using a caudal
approach. At the conclusion of the procedure a spiral shaped
HydroMARK tissue marker clip was deployed into the biopsy cavity.
This was well seen within the biopsied lymph node at ultrasound.
Follow up 2 view mammogram was performed and dictated separately.
IMPRESSION: Ultrasound guided biopsy of the recently demonstrated left axillary
lymph node with focal, eccentric cortical thickening. No apparent
complications.

## 2021-05-24 ENCOUNTER — Encounter: Payer: Self-pay | Admitting: Oncology

## 2021-05-26 ENCOUNTER — Encounter: Payer: Self-pay | Admitting: Licensed Clinical Social Worker

## 2021-05-26 ENCOUNTER — Telehealth: Payer: Self-pay | Admitting: Licensed Clinical Social Worker

## 2021-05-26 ENCOUNTER — Ambulatory Visit: Payer: Self-pay | Admitting: Licensed Clinical Social Worker

## 2021-05-26 DIAGNOSIS — Z1379 Encounter for other screening for genetic and chromosomal anomalies: Secondary | ICD-10-CM | POA: Insufficient documentation

## 2021-05-26 DIAGNOSIS — Z171 Estrogen receptor negative status [ER-]: Secondary | ICD-10-CM

## 2021-05-26 DIAGNOSIS — Z803 Family history of malignant neoplasm of breast: Secondary | ICD-10-CM

## 2021-05-26 DIAGNOSIS — C50412 Malignant neoplasm of upper-outer quadrant of left female breast: Secondary | ICD-10-CM

## 2021-05-26 LAB — SURGICAL PATHOLOGY

## 2021-05-26 NOTE — Telephone Encounter (Signed)
Revealed negative genetic testing. This normal result is reassuring and indicates that it is unlikely Peggy Bates's cancer is due to a hereditary cause.  It is unlikely that there is an increased risk of another cancer due to a mutation in one of these genes.  However, genetic testing is not perfect, and cannot definitively rule out a hereditary cause.  It will be important for her to keep in contact with genetics to learn if any additional testing may be needed in the future.

## 2021-05-26 NOTE — Progress Notes (Signed)
HPI:  Peggy Bates was previously seen in the [facility] clinic due to a personal and family history of cancer and concerns regarding a hereditary predisposition to cancer. Please refer to our prior cancer genetics clinic note for more information regarding our discussion, assessment and recommendations, at the time. Peggy Bates recent genetic test results were disclosed to her, as were recommendations warranted by these results. These results and recommendations are discussed in more detail below.  CANCER HISTORY:  Oncology History Overview Note  # Pathology dated April 07, 2021 from [facility] in [place]:  Ultrasound-guided core biopsy. Nuclear grade 3, high-grade. P53: 70% staining. Ki-67: 70% staining.  ER negative, PR negative, HER2 negative. Mitotic rate of 13 mitoses per high-power field.  The patient brought a copy of her imaging studies with her from [place] and these have been independently reviewed.  Screening mammogram dated March 30, 2021 showed an asymmetric nodular density in the superior lateral aspect of the left breast. The right breast was unremarkable BI-RADS-0.  Ultrasound examination of the left breast showed a 1.1 cm round, nonparallel hypoechoic mass with angular margins. BI-RADS-4.  Family history of breast cancer and a sister in her late 21s, early 34s. No history of genetic testing available at this time.   Carcinoma of upper-outer quadrant of left breast in female, estrogen receptor negative ([place])  04/21/2021 Initial Diagnosis   Carcinoma of upper-outer quadrant of left breast in female, estrogen receptor negative ([place])   04/29/2021 Cancer Staging   Staging form: Breast, AJCC 8th Edition - Clinical stage from 04/29/2021: Stage IB (cT1c, cN0, cM0, G3, ER-, PR-, HER2-) - Signed by Earlie Server, MD on 04/29/2021 Stage prefix: Initial diagnosis Histologic grading system: 3 grade system   05/12/2021 -  Chemotherapy    Patient is on  Treatment Plan: BREAST ADJUVANT DOSE DENSE AC Q14D / PACLITAXEL Q7D       Genetic Testing   Negative genetic testing. No pathogenic variants identified on the [facility] Multi-Cancer Panel +RNA. The report date is 05/25/2021.  The Multi-Cancer Panel + RNA offered by [facility] includes sequencing and/or deletion duplication testing of the following 84 genes: AIP, ALK, APC, ATM, AXIN2,BAP1,  BARD1, BLM, BMPR1A, BRCA1, BRCA2, BRIP1, CASR, CDC73, CDH1, CDK4, CDKN1B, CDKN1C, CDKN2A (p14ARF), CDKN2A (p16INK4a), CEBPA, CHEK2, CTNNA1, DICER1, DIS3L2, EGFR (c.2369C>T, p.Thr790Met variant only), EPCAM (Deletion/duplication testing only), FH, FLCN, GATA2, GPC3, GREM1 (Promoter region deletion/duplication testing only), HOXB13 (c.251G>A, p.Gly84Glu), HRAS, KIT, MAX, MEN1, MET, MITF (c.952G>A, p.Glu318Lys variant only), MLH1, MSH2, MSH3, MSH6, MUTYH, NBN, NF1, NF2, NTHL1, PALB2, PDGFRA, PHOX2B, PMS2, POLD1, POLE, POT1, PRKAR1A, PTCH1, PTEN, RAD50, RAD51C, RAD51D, RB1, RECQL4, RET, RUNX1, SDHAF2, SDHA (sequence changes only), SDHB, SDHC, SDHD, SMAD4, SMARCA4, SMARCB1, SMARCE1, STK11, SUFU, TERC, TERT, TMEM127, TP53, TSC1, TSC2, VHL, WRN and WT1.     FAMILY HISTORY:  We obtained a detailed, 4-generation family history.  Significant diagnoses are listed below: Family History  Problem Relation Age of Onset  . Breast cancer Paternal Grandmother   . Breast cancer Sister        dx 25s, recurrence x3  . Diabetes Sister   . Diabetes Father   . Throat cancer Maternal Grandmother     Peggy Bates had 2 sons and 1 daughter, her daughter passed at 58. Patient has 2 sisters, 1 brother, and 1 maternal half sister. One of her full sisters was diagnosed with breast cancer in her 1s initially and has had recurrences of this cancer that is now metastatic, she  is currently 45 and patient does not believe she has had genetic testing.   Peggy Bates mother died at 4, no cancer. Patient had 3 maternal uncles, no cancers.  Maternal grandmother had throat cancer and history of smoking, no info about maternal grandfather.   Peggy Bates father died over 94. Patient had 3 paternal uncles and 1 aunt. A paternal uncle had cancer, unknown type.  A paternal cousin had lung cancer. Paternal grandmother had breast cancer, unknown age. No information about paternal grandfather.  Peggy Bates is unaware of previous family history of genetic testing for hereditary cancer risks. There is no reported Ashkenazi Jewish ancestry. There is no known consanguinity.    GENETIC TEST RESULTS: Genetic testing reported out on 05/25/2021 through the [facility] Multi- cancer panel found no pathogenic mutations.   The Multi-Cancer Panel + RNA offered by [facility] includes sequencing and/or deletion duplication testing of the following 84 genes: AIP, ALK, APC, ATM, AXIN2,BAP1,  BARD1, BLM, BMPR1A, BRCA1, BRCA2, BRIP1, CASR, CDC73, CDH1, CDK4, CDKN1B, CDKN1C, CDKN2A (p14ARF), CDKN2A (p16INK4a), CEBPA, CHEK2, CTNNA1, DICER1, DIS3L2, EGFR (c.2369C>T, p.Thr790Met variant only), EPCAM (Deletion/duplication testing only), FH, FLCN, GATA2, GPC3, GREM1 (Promoter region deletion/duplication testing only), HOXB13 (c.251G>A, p.Gly84Glu), HRAS, KIT, MAX, MEN1, MET, MITF (c.952G>A, p.Glu318Lys variant only), MLH1, MSH2, MSH3, MSH6, MUTYH, NBN, NF1, NF2, NTHL1, PALB2, PDGFRA, PHOX2B, PMS2, POLD1, POLE, POT1, PRKAR1A, PTCH1, PTEN, RAD50, RAD51C, RAD51D, RB1, RECQL4, RET, RUNX1, SDHAF2, SDHA (sequence changes only), SDHB, SDHC, SDHD, SMAD4, SMARCA4, SMARCB1, SMARCE1, STK11, SUFU, TERC, TERT, TMEM127, TP53, TSC1, TSC2, VHL, WRN and WT1.   The test report has been scanned into EPIC and is located under the Molecular Pathology section of the Results Review tab.  A portion of the result report is included below for reference.     We discussed with Peggy Bates that because current genetic testing is not perfect, it is possible there may be a gene mutation in one  of these genes that current testing cannot detect, but that chance is small.  We also discussed, that there could be another gene that has not yet been discovered, or that we have not yet tested, that is responsible for the cancer diagnoses in the family. It is also possible there is a hereditary cause for the cancer in the family that Peggy Bates did not inherit and therefore was not identified in her testing.  Therefore, it is important to remain in touch with cancer genetics in the future so that we can continue to offer Peggy Bates the most up to date genetic testing.   ADDITIONAL GENETIC TESTING: We discussed with Peggy Bates that her genetic testing was fairly extensive.  If there are genes identified to increase cancer risk that can be analyzed in the future, we would be happy to discuss and coordinate this testing at that time.    CANCER SCREENING RECOMMENDATIONS: Peggy Bates test result is considered negative (normal).  This means that we have not identified a hereditary cause for her  personal and family history of cancer at this time. Most cancers happen by chance and this negative test suggests that her cancer may fall into this category.    While reassuring, this does not definitively rule out a hereditary predisposition to cancer. It is still possible that there could be genetic mutations that are undetectable by current technology. There could be genetic mutations in genes that have not been tested or identified to increase cancer risk.  Therefore, it is recommended she continue to follow the cancer  management and screening guidelines provided by her oncology and primary healthcare provider.   An individual's cancer risk and medical management are not determined by genetic test results alone. Overall cancer risk assessment incorporates additional factors, including personal medical history, family history, and any available genetic information that may result in a personalized  plan for cancer prevention and surveillance.  RECOMMENDATIONS FOR FAMILY MEMBERS:  Relatives in this family might be at some increased risk of developing cancer, over the general population risk, simply due to the family history of cancer.  We recommended female relatives in this family have a yearly mammogram beginning at age 16, or 62 years younger than the earliest onset of cancer, an annual clinical breast exam, and perform monthly breast self-exams. Female relatives in this family should also have a gynecological exam as recommended by their primary provider.  All family members should be referred for colonoscopy starting at age 28.    It is also possible there is a hereditary cause for the cancer in Peggy Bates's family that she did not inherit and therefore was not identified in her.  Based on Peggy Bates's family history, we recommended her sister, Darrick Penna and/or those related to her have genetic counseling and testing. Peggy Bates will let us know if we can be of any assistance in coordinating genetic counseling and/or testing for these family members.  FOLLOW-UP: Lastly, we discussed with Peggy Bates that cancer genetics is a rapidly advancing field and it is possible that new genetic tests will be appropriate for her and/or her family members in the future. We encouraged her to remain in contact with cancer genetics on an annual basis so we can update her personal and family histories and let her know of advances in cancer genetics that may benefit this family.   Our contact number was provided. Ms. Corle questions were answered to her satisfaction, and she knows she is welcome to call us at anytime with additional questions or concerns.   Faith Rogue, MS, [facility] Genetic Counselor [email or web address] Phone: [phone]

## 2021-05-31 ENCOUNTER — Other Ambulatory Visit: Payer: Self-pay | Admitting: Oncology

## 2021-05-31 DIAGNOSIS — R928 Other abnormal and inconclusive findings on diagnostic imaging of breast: Secondary | ICD-10-CM

## 2021-05-31 DIAGNOSIS — R9389 Abnormal findings on diagnostic imaging of other specified body structures: Secondary | ICD-10-CM

## 2021-06-02 ENCOUNTER — Other Ambulatory Visit: Payer: Self-pay

## 2021-06-02 ENCOUNTER — Ambulatory Visit
Admission: RE | Admit: 2021-06-02 | Discharge: 2021-06-02 | Disposition: A | Discharge status: Home / Self Care | Source: Ambulatory Visit | Attending: Oncology | Admitting: Oncology

## 2021-06-02 ENCOUNTER — Inpatient Hospital Stay: Attending: Oncology | Admitting: Hospice and Palliative Medicine

## 2021-06-02 ENCOUNTER — Ambulatory Visit

## 2021-06-02 ENCOUNTER — Encounter

## 2021-06-02 DIAGNOSIS — Z171 Estrogen receptor negative status [ER-]: Secondary | ICD-10-CM | POA: Insufficient documentation

## 2021-06-02 DIAGNOSIS — Z79899 Other long term (current) drug therapy: Secondary | ICD-10-CM | POA: Insufficient documentation

## 2021-06-02 DIAGNOSIS — R9389 Abnormal findings on diagnostic imaging of other specified body structures: Secondary | ICD-10-CM

## 2021-06-02 DIAGNOSIS — Z803 Family history of malignant neoplasm of breast: Secondary | ICD-10-CM | POA: Insufficient documentation

## 2021-06-02 DIAGNOSIS — Z5112 Encounter for antineoplastic immunotherapy: Secondary | ICD-10-CM | POA: Insufficient documentation

## 2021-06-02 DIAGNOSIS — C50412 Malignant neoplasm of upper-outer quadrant of left female breast: Secondary | ICD-10-CM | POA: Insufficient documentation

## 2021-06-02 DIAGNOSIS — R7401 Elevation of levels of liver transaminase levels: Secondary | ICD-10-CM | POA: Insufficient documentation

## 2021-06-02 IMAGING — MG MM BREAST LOCALIZATION CLIP
4 series · 4 of 12 positions shown · non-contrast
Comparison: Previous exam(s).

CLINICAL DATA: Status post MR guided core biopsy of posterior
extent of known LEFT breast malignancy.

EXAM:
DIAGNOSTIC LEFT MAMMOGRAM POST MRI BIOPSY

[L CC synth-2D]
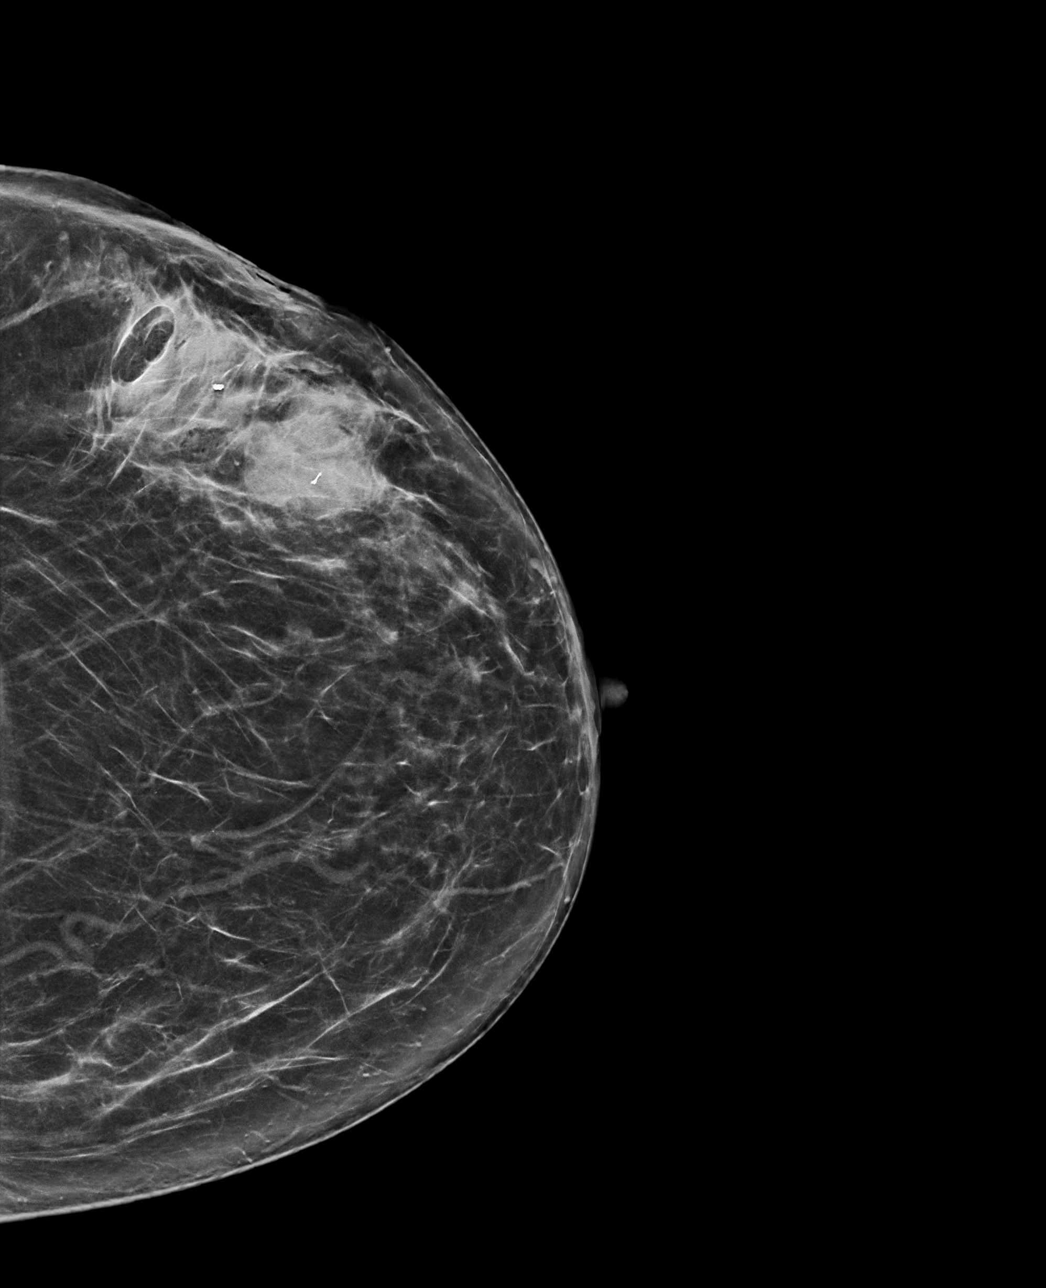

[L ML synth-2D]
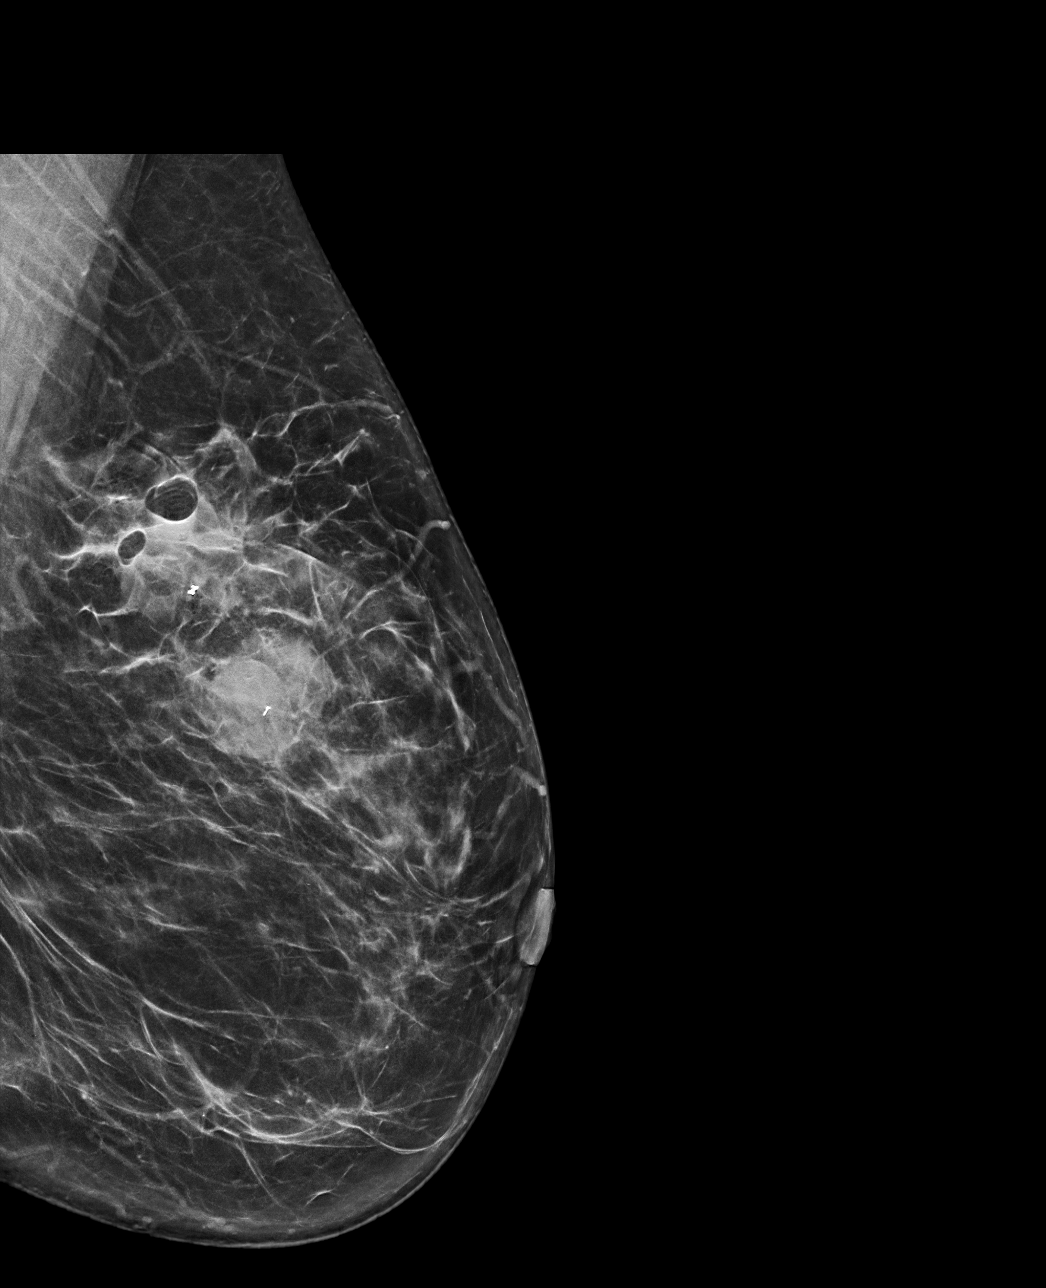

[L ML tomo · tomo slice 39/77.0]
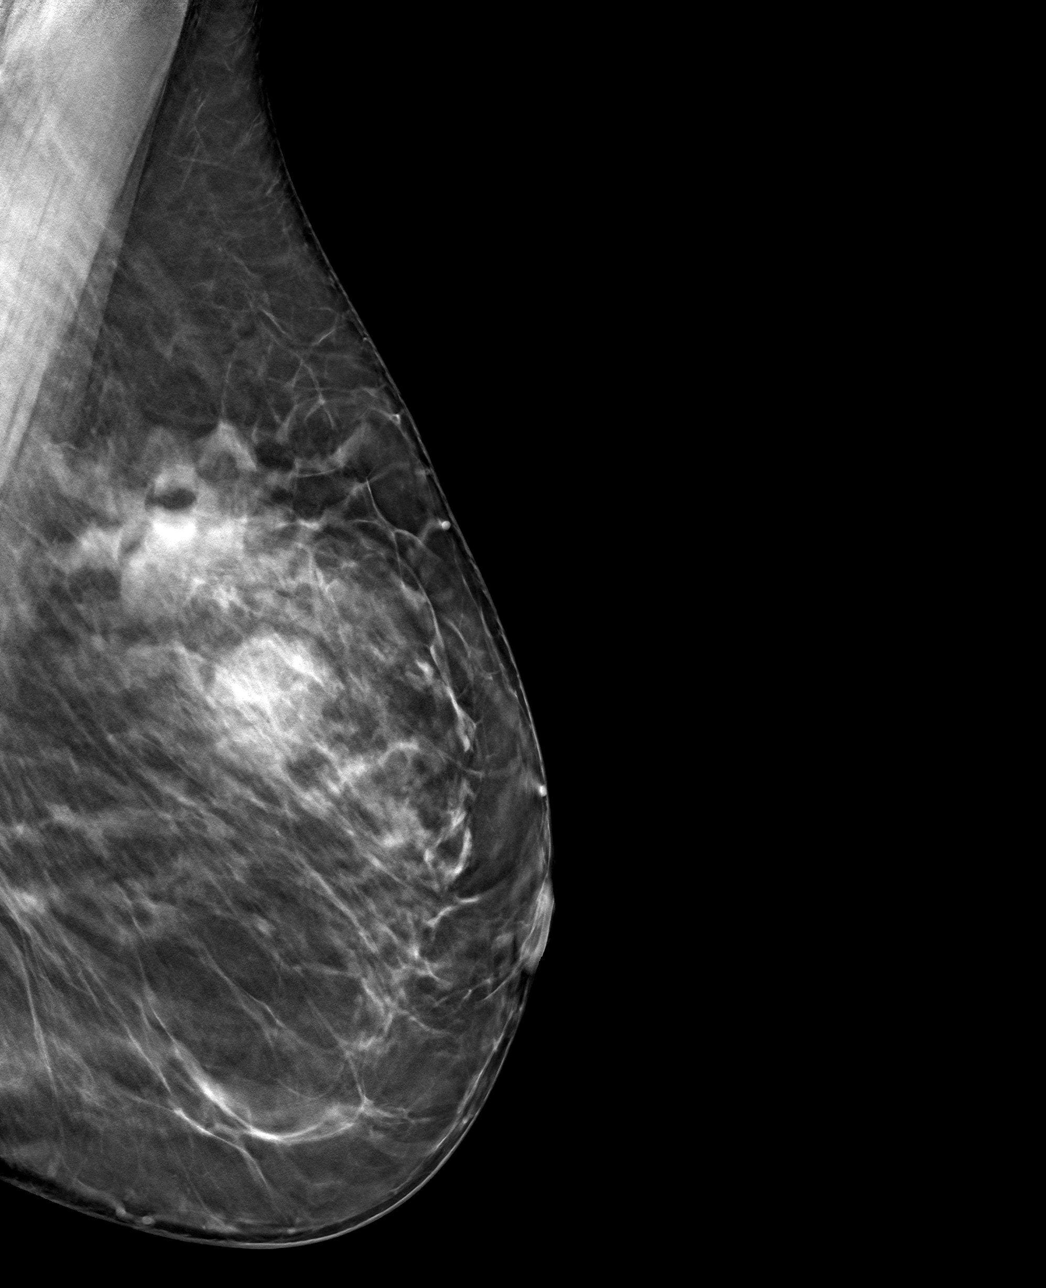

[L CC tomo · tomo slice 37/73.0]
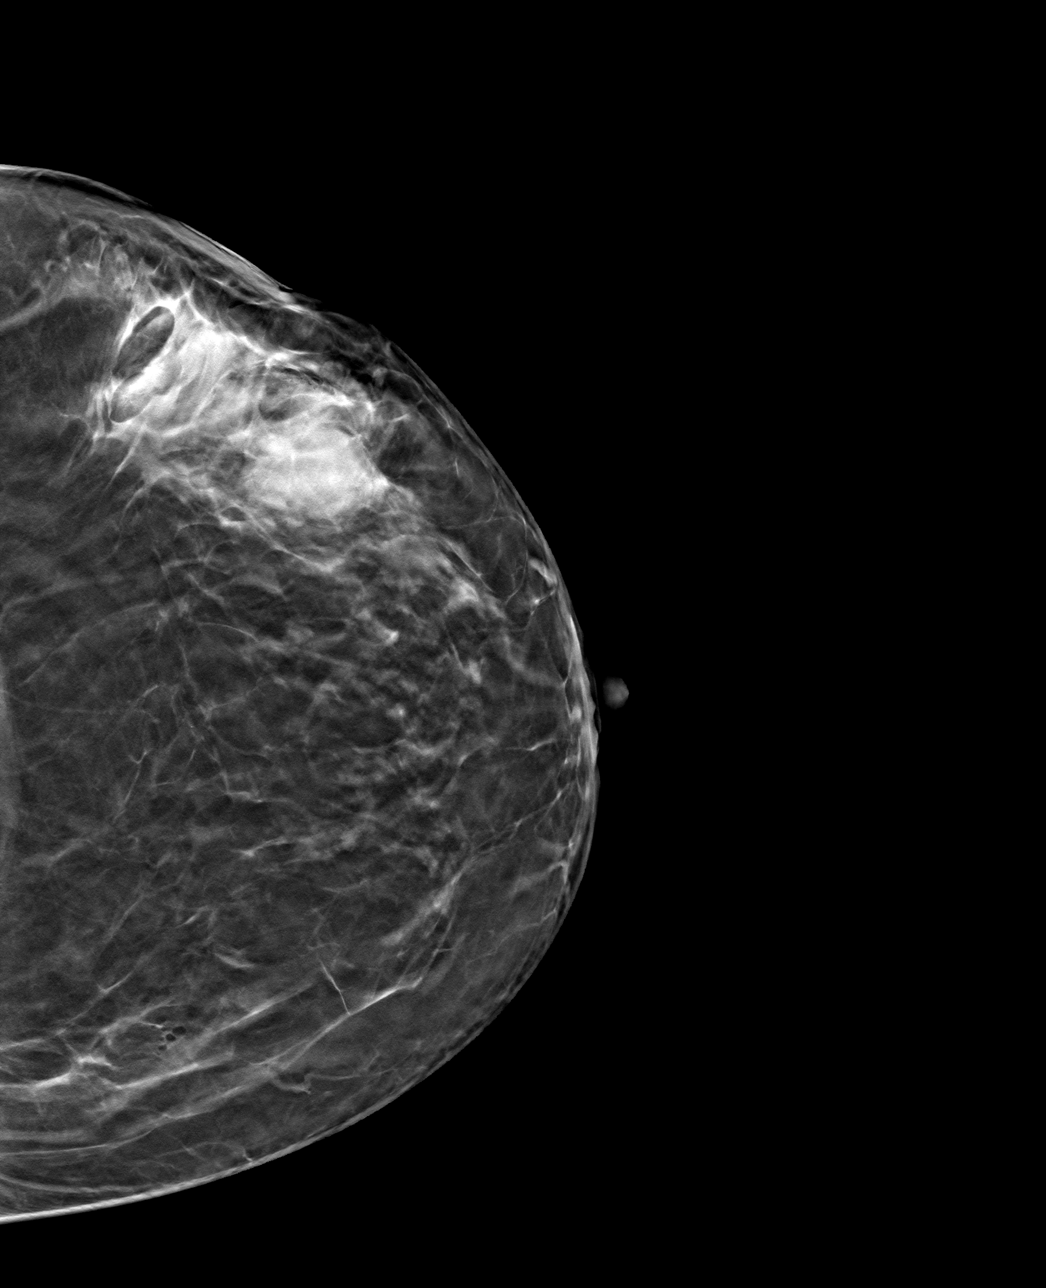

[4 of 12 positions shown; findings below may reference images not displayed]

FINDINGS: Mammographic images were obtained following MRI guided biopsy of
biopsy of nodularity posterior to known malignancy in the
UPPER-OUTER QUADRANT of the LEFT breast and placement of a barbell
shaped clip. The biopsy marking clip is in expected position at the
site of biopsy. The barbell and twist type clip placed at the
original biopsy are 3.2 centimeters apart on the true LATERAL
projection.
IMPRESSION: Appropriate positioning of the barbell shaped biopsy marking clip at
the site of biopsy in the UPPER-OUTER QUADRANT LEFT breast.

Final Assessment: Post Procedure Mammograms for Marker Placement

## 2021-06-02 IMAGING — MR MR BREAST BX W LOC DEV 1ST LESION IMAGE BX SPEC MR GUIDE*L*
6 of 8 series · 32 of 48 positions shown · IV contrast (10 mL Gadavist)
Comparison: Previous exams.
COMPARISON: Previous exams.

Addendum:
CLINICAL DATA: Patient presents for MR guided core biopsy of the
posterior aspect of known LEFT breast malignancy.

EXAM:
MRI GUIDED CORE NEEDLE BIOPSY OF THE LEFT BREAST
TECHNIQUE: Multiplanar, multisequence MR imaging of the LEFT breast was
performed both before and after administration of intravenous
contrast.
CONTRAST:  10 ml of Gadavist

[Series 2: fiducial unilateral · sagittal · 2.0mm · 1.33mm/px · 1 of 64 slices shown]
[im 1/64]
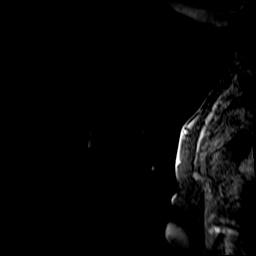

[Series 4: dynamic post 20 · axial · 1.3mm · 0.73mm/px · z∈[-107,+121]mm · 6 of 176 slices shown (1 of 2)]
[im 1/176]
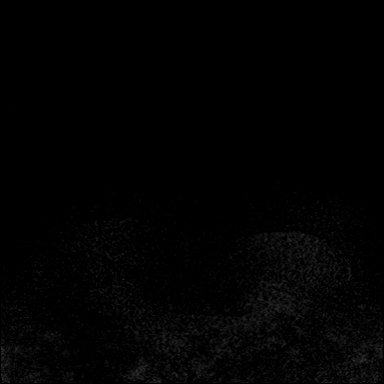
[im 36/176]
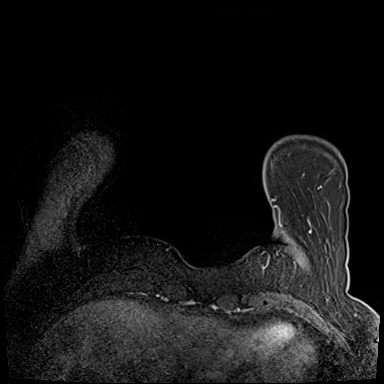
[im 71/176]
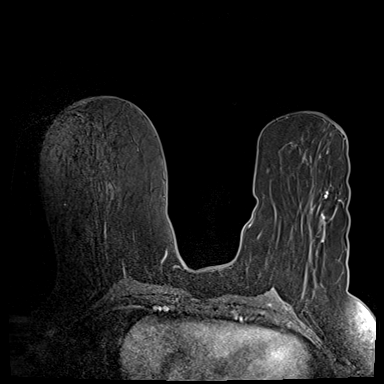
[im 106/176]
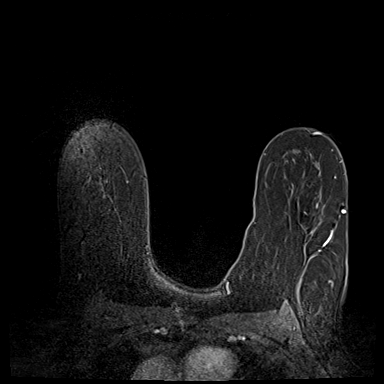
[im 141/176]
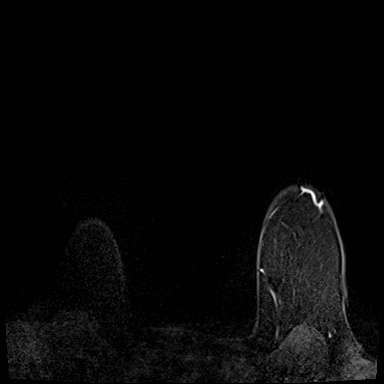
[im 176/176]
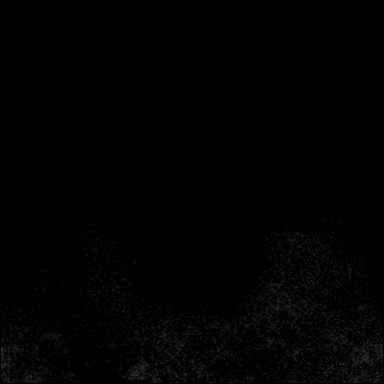

[Series 5: dynamic post 20 · axial · 1.3mm · 0.73mm/px · z∈[-107,+121]mm · 6 of 176 slices shown (2 of 2)]
[im 1/176]
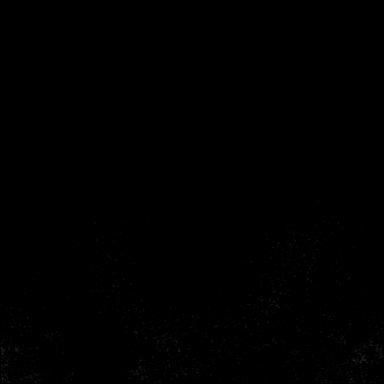
[im 36/176]
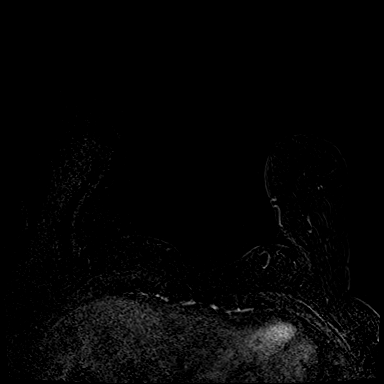
[im 71/176]
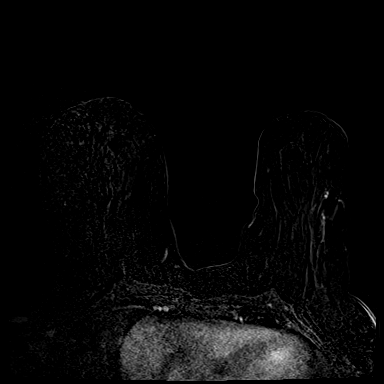
[im 106/176]
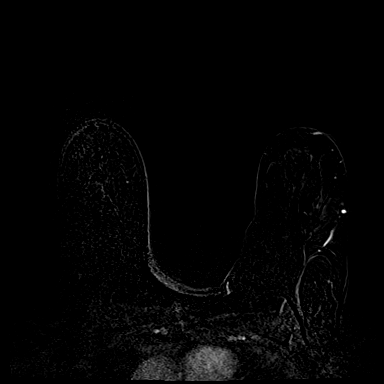
[im 141/176]
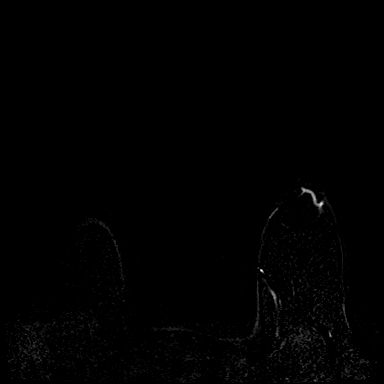
[im 176/176]
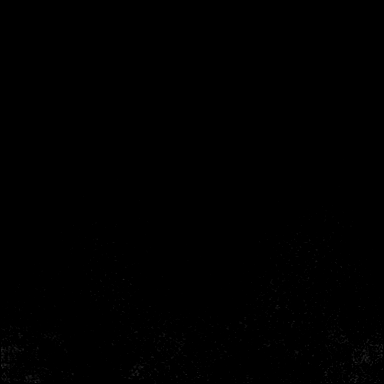

[Series 6: dynamic post 3 · axial · 1.3mm · 0.73mm/px · z∈[-107,+121]mm · 7 of 176 slices shown (1 of 2)]
[im 1/176]
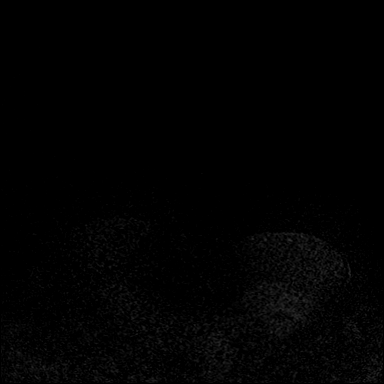
[im 30/176]
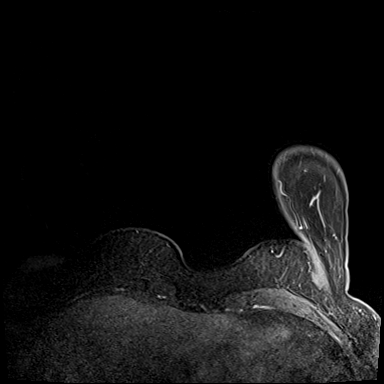
[im 59/176]
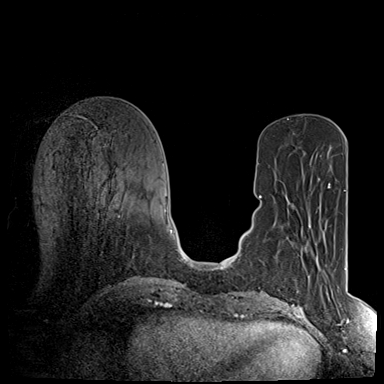
[im 88/176]
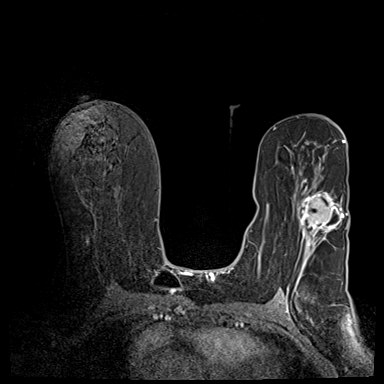
[im 117/176]
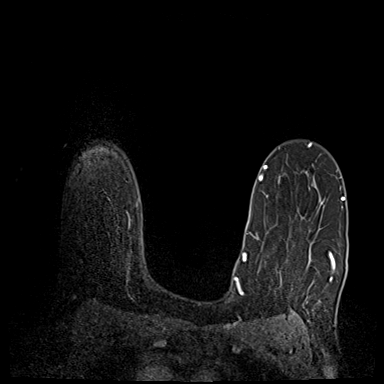
[im 146/176]
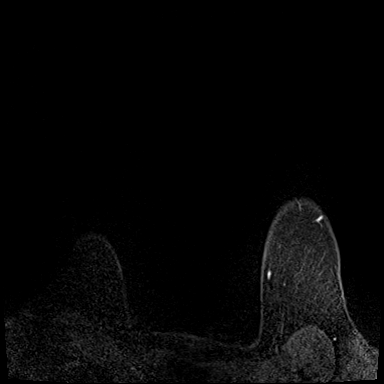
[im 176/176]
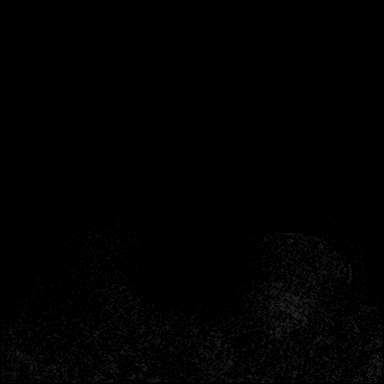

[Series 7: dynamic post 3 · axial · 1.3mm · 0.73mm/px · z∈[-107,+121]mm · 7 of 176 slices shown (2 of 2)]
[im 1/176]
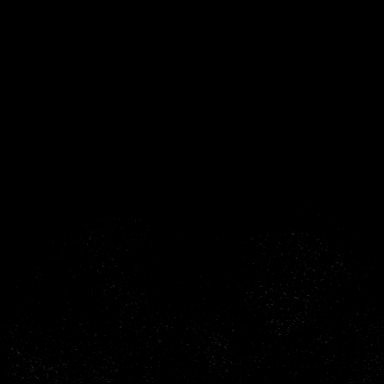
[im 30/176]
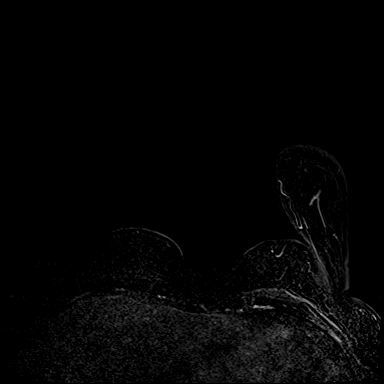
[im 59/176]
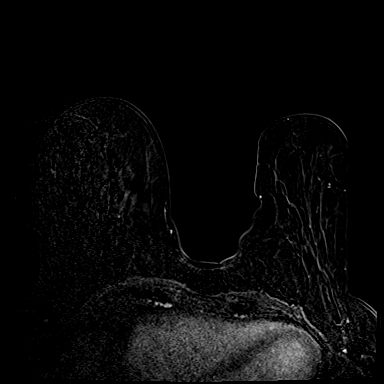
[im 88/176]
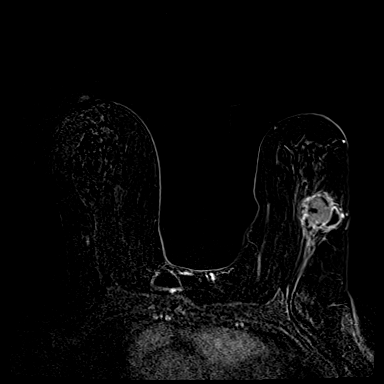
[im 117/176]
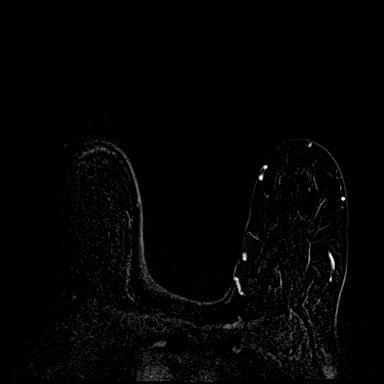
[im 146/176]
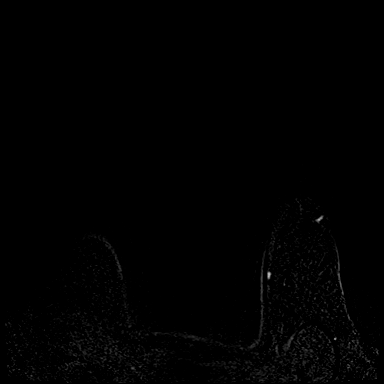
[im 176/176]
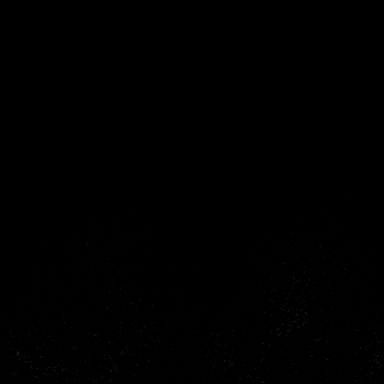

[Series 8: needle confirmation · axial · 1.3mm · 0.73mm/px · z∈[-107,+44]mm · 5 of 176 slices shown]
[im 1/176]
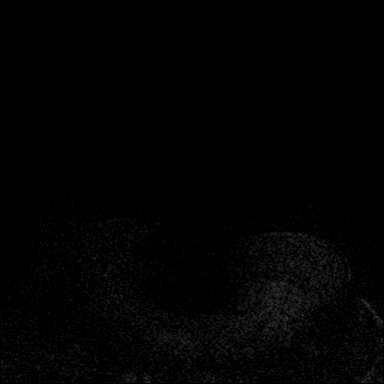
[im 30/176]
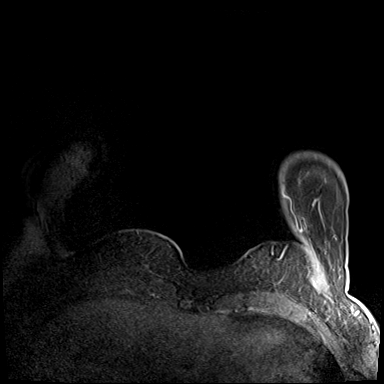
[im 59/176]
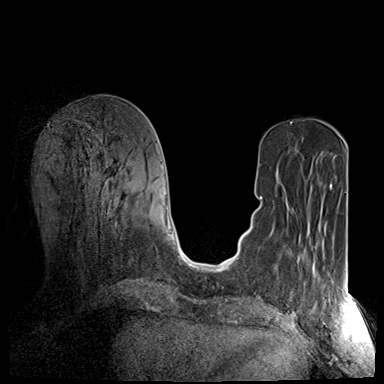
[im 88/176]
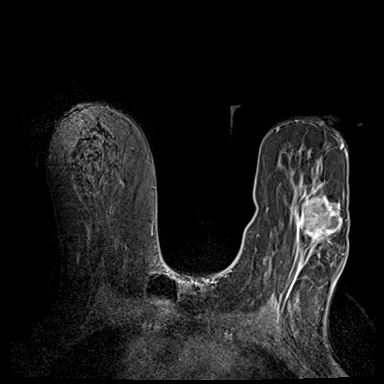
[im 117/176]
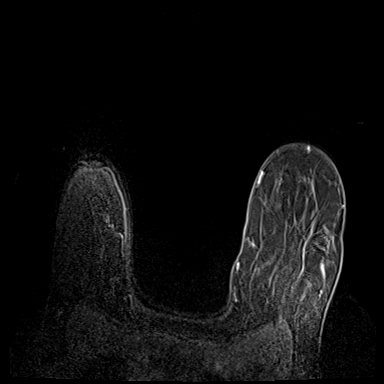

[32 of 48 positions shown; findings below may reference images not displayed]

FINDINGS: I met with the patient, and we discussed the procedure of MRI guided
biopsy, including risks, benefits, and alternatives. Specifically,
we discussed the risks of infection, bleeding, tissue injury, clip
migration, and inadequate sampling. Informed, written consent was
given. The usual time out protocol was performed immediately prior
to the procedure.

Using sterile technique, 1% Lidocaine, MRI guidance, and a 9 gauge
vacuum assisted device, biopsy was performed of enhancing nodules
posterior to the known malignancy in the UPPER-OUTER QUADRANT of the
LEFT breast using a LATERAL to MEDIAL approach. At the conclusion of
the procedure, a barbell shaped tissue marker clip was deployed into
the biopsy cavity. Follow-up 2-view mammogram was performed and
dictated separately.
IMPRESSION: MRI guided biopsy of LEFT breast .  No apparent complications.

ADDENDUM:
Pathology revealed DUCTAL CARCINOMA IN SITU, HIGH-GRADE of the LEFT
breast, upper outer quadrant, posterior aspect, (barbell clip). This
was found to be concordant by Dr. RONLOR.

Pathology results were discussed with the patient by telephone. The
patient reported doing well after the biopsy with tenderness at the
site. Post biopsy instructions and care were reviewed and questions
were answered. The patient was encouraged to call The [REDACTED] for any additional concerns. My direct phone
number was provided.

The patient has a recent diagnosis of LEFT breast cancer and should
follow her outlined treatment plan.

Pathology results reported by RONLOR, RN on [DATE].

*** End of Addendum ***
FINDINGS: I met with the patient, and we discussed the procedure of MRI guided
biopsy, including risks, benefits, and alternatives. Specifically,
we discussed the risks of infection, bleeding, tissue injury, clip
migration, and inadequate sampling. Informed, written consent was
given. The usual time out protocol was performed immediately prior
to the procedure.

Using sterile technique, 1% Lidocaine, MRI guidance, and a 9 gauge
vacuum assisted device, biopsy was performed of enhancing nodules
posterior to the known malignancy in the UPPER-OUTER QUADRANT of the
LEFT breast using a LATERAL to MEDIAL approach. At the conclusion of
the procedure, a barbell shaped tissue marker clip was deployed into
the biopsy cavity. Follow-up 2-view mammogram was performed and
dictated separately.
IMPRESSION: MRI guided biopsy of LEFT breast .  No apparent complications.

## 2021-06-02 NOTE — Progress Notes (Signed)
Multidisciplinary Oncology Council Documentation  Oletta Buehring was presented by our [facility] on 06/02/2021, which included representatives from:  Palliative Care Dietitian  Physical/Occupational Therapist Nurse Navigator Genetics Speech Therapist Social work Survivorship Therapist, occupational Research RN [employer] currently presents with history of breast cancer  We reviewed previous medical and familial history, history of present illness, and recent lab results along with all available histopathologic and imaging studies. The [place] considered available treatment options and made the following recommendations/referrals:  None currently  The MOC is a meeting of clinicians from various specialty areas who evaluate and discuss patients for whom a multidisciplinary approach is being considered. Final determinations in the plan of care are those of the provider(s).   Today's extended care, comprehensive team conference, Se[place] was not present for the discussion and was not examined.

## 2021-06-04 ENCOUNTER — Other Ambulatory Visit: Payer: Self-pay | Admitting: Oncology

## 2021-06-04 ENCOUNTER — Telehealth: Payer: Self-pay

## 2021-06-04 NOTE — Progress Notes (Signed)
DISCONTINUE ON PATHWAY REGIMEN - Breast     Cycles 1 through 4: A cycle is every 14 days:     Doxorubicin      Cyclophosphamide      Pegfilgrastim-xxxx    Cycles 5 through 16: A cycle is every 7 days:     Paclitaxel   **Always confirm dose/schedule in your pharmacy ordering system**  REASON: Other Reason PRIOR TREATMENT: [id number]: Dose-Dense AC-T (Paclitaxel Weekly) - [Doxorubicin + Cyclophosphamide q14 Days x 4 Cycles, Followed by Paclitaxel 80 mg/m2 Weekly x 12 Weeks] TREATMENT RESPONSE: Unable to Evaluate  START ON PATHWAY REGIMEN - Breast     Cycles 1 through 4: A cycle is every 21 days:     Pembrolizumab      Paclitaxel      Carboplatin      Filgrastim-xxxx    Cycles 5 through 8: A cycle is every 21 days:     Pembrolizumab      Doxorubicin      Cyclophosphamide      Pegfilgrastim-xxxx   **Always confirm dose/schedule in your pharmacy ordering system**  Patient Characteristics: Preoperative or Nonsurgical Candidate (Clinical Staging), Neoadjuvant Therapy followed by Surgery, Invasive Disease, Chemotherapy, HER2 Negative/Unknown/Equivocal, ER Negative/Unknown, Platinum Therapy Indicated, High-Risk Disease Present Therapeutic Status: Preoperative or Nonsurgical Candidate (Clinical Staging) AJCC M Category: cM0 AJCC Grade: G3 Breast Surgical Plan: Neoadjuvant Therapy followed by Surgery ER Status: Negative (-) AJCC 8 Stage Grouping: IIB HER2 Status: Negative (-) AJCC T Category: cT2 AJCC N Category: cN0 PR Status: Negative (-) Type of Therapy: Platinum Therapy Indicated Intent of Therapy: Curative Intent, Discussed with Patient

## 2021-06-04 NOTE — Telephone Encounter (Signed)
Patient schedule and notified by CW.

## 2021-06-04 NOTE — Telephone Encounter (Signed)
Per secure chat from Dr. Tasia Catchings: please schedule patient for lab/MD/ pembro/ taxol/ carboplatin *new* next week asap and notify pt of appts. Pt know the plan.

## 2021-06-04 NOTE — Progress Notes (Signed)
ON PATHWAY REGIMEN - Breast  No Change  Continue With Treatment as Ordered.  Original Decision Date/Time: 04/29/2021 22:58     Cycles 1 through 4: A cycle is every 14 days:     Doxorubicin      Cyclophosphamide      Pegfilgrastim-xxxx    Cycles 5 through 16: A cycle is every 7 days:     Paclitaxel   **Always confirm dose/schedule in your pharmacy ordering system**  Patient Characteristics: Preoperative or Nonsurgical Candidate (Clinical Staging), Neoadjuvant Therapy followed by Surgery, Invasive Disease, Chemotherapy, HER2 Negative/Unknown/Equivocal, ER Negative/Unknown, Platinum Therapy Not Indicated Therapeutic Status: Preoperative or Nonsurgical Candidate (Clinical Staging) AJCC M Category: cM0 AJCC Grade: G3 Breast Surgical Plan: Neoadjuvant Therapy followed by Surgery ER Status: Negative (-) AJCC 8 Stage Grouping: IB HER2 Status: Negative (-) AJCC T Category: cT1c AJCC N Category: cN0 PR Status: Negative (-) Type of Therapy: Platinum Therapy Not Indicated Intent of Therapy: Curative Intent, Discussed with Patient

## 2021-06-06 ENCOUNTER — Encounter: Payer: Self-pay | Admitting: Oncology

## 2021-06-07 ENCOUNTER — Other Ambulatory Visit: Payer: Self-pay | Admitting: Oncology

## 2021-06-07 DIAGNOSIS — Z171 Estrogen receptor negative status [ER-]: Secondary | ICD-10-CM

## 2021-06-07 MED ORDER — LIDOCAINE-PRILOCAINE 2.5-2.5 % EX CREA: TOPICAL_CREAM | CUTANEOUS | 3 refills | Status: DC

## 2021-06-07 MED ORDER — ONDANSETRON HCL 8 MG PO TABS: 8.0000 mg | ORAL_TABLET | Freq: Two times a day (BID) | ORAL | 1 refills | Status: DC | PRN

## 2021-06-07 MED ORDER — DEXAMETHASONE 4 MG PO TABS
ORAL_TABLET | ORAL | 1 refills | Status: DC
Start: 2021-06-07 — End: 2021-09-15

## 2021-06-07 MED ORDER — PROCHLORPERAZINE MALEATE 10 MG PO TABS: 10.0000 mg | ORAL_TABLET | Freq: Four times a day (QID) | ORAL | 1 refills | Status: DC | PRN

## 2021-06-08 ENCOUNTER — Inpatient Hospital Stay (HOSPITAL_BASED_OUTPATIENT_CLINIC_OR_DEPARTMENT_OTHER): Admitting: Oncology

## 2021-06-08 ENCOUNTER — Encounter: Payer: Self-pay | Admitting: Oncology

## 2021-06-08 ENCOUNTER — Inpatient Hospital Stay

## 2021-06-08 VITALS — BP 106/75 | HR 80 | Temp 97.6°F | Resp 18 | Wt 218.9 lb

## 2021-06-08 VITALS — BP 101/74 | HR 64 | Temp 97.9°F | Resp 18

## 2021-06-08 DIAGNOSIS — R7401 Elevation of levels of liver transaminase levels: Secondary | ICD-10-CM

## 2021-06-08 DIAGNOSIS — C50412 Malignant neoplasm of upper-outer quadrant of left female breast: Secondary | ICD-10-CM

## 2021-06-08 DIAGNOSIS — Z171 Estrogen receptor negative status [ER-]: Secondary | ICD-10-CM

## 2021-06-08 DIAGNOSIS — Z803 Family history of malignant neoplasm of breast: Secondary | ICD-10-CM | POA: Diagnosis not present

## 2021-06-08 DIAGNOSIS — Z5112 Encounter for antineoplastic immunotherapy: Secondary | ICD-10-CM | POA: Diagnosis not present

## 2021-06-08 DIAGNOSIS — Z79899 Other long term (current) drug therapy: Secondary | ICD-10-CM | POA: Diagnosis not present

## 2021-06-08 DIAGNOSIS — Z5111 Encounter for antineoplastic chemotherapy: Secondary | ICD-10-CM

## 2021-06-08 LAB — COMPREHENSIVE METABOLIC PANEL
ALT: 51 U/L — ABNORMAL HIGH (ref 0–44)
AST: 34 U/L (ref 15–41)
Albumin: 4.3 g/dL (ref 3.5–5.0)
Alkaline Phosphatase: 79 U/L (ref 38–126)
Anion gap: 8 (ref 5–15)
BUN: 20 mg/dL (ref 6–20)
CO2: 25 mmol/L (ref 22–32)
Calcium: 9.3 mg/dL (ref 8.9–10.3)
Chloride: 103 mmol/L (ref 98–111)
Creatinine, Ser: 0.64 mg/dL (ref 0.44–1.00)
GFR, Estimated: >60 mL/min (ref >60)
Glucose, Bld: 80 mg/dL (ref 70–99)
Potassium: 4.3 mmol/L (ref 3.5–5.1)
Sodium: 136 mmol/L (ref 135–145)
Total Bilirubin: 0.5 mg/dL (ref 0.3–1.2)
Total Protein: 8 g/dL (ref 6.5–8.1)

## 2021-06-08 LAB — CBC WITH DIFFERENTIAL/PLATELET
Abs Immature Granulocytes: 0.01 10*3/uL (ref 0.00–0.07)
Basophils Absolute: 0 10*3/uL (ref 0.0–0.1)
Basophils Relative: 0 %
Eosinophils Absolute: 0.1 10*3/uL (ref 0.0–0.5)
Eosinophils Relative: 3 %
HCT: 40.1 % (ref 36.0–46.0)
Hemoglobin: 12.8 g/dL (ref 12.0–15.0)
Immature Granulocytes: 0 %
Lymphocytes Relative: 36 %
Lymphs Abs: 1.3 10*3/uL (ref 0.7–4.0)
MCH: 27.4 pg (ref 26.0–34.0)
MCHC: 31.9 g/dL (ref 30.0–36.0)
MCV: 85.7 fL (ref 80.0–100.0)
Monocytes Absolute: 0.4 10*3/uL (ref 0.1–1.0)
Monocytes Relative: 12 %
Neutro Abs: 1.7 10*3/uL (ref 1.7–7.7)
Neutrophils Relative %: 49 %
Platelets: 228 10*3/uL (ref 150–400)
RBC: 4.68 MIL/uL (ref 3.87–5.11)
RDW: 13.3 % (ref 11.5–15.5)
WBC: 3.5 10*3/uL — ABNORMAL LOW (ref 4.0–10.5)
nRBC: 0 % (ref 0.0–0.2)

## 2021-06-08 LAB — TSH: TSH: 1.135 u[IU]/mL (ref 0.350–4.500)

## 2021-06-08 MED ORDER — DIPHENHYDRAMINE HCL 50 MG/ML IJ SOLN
50.0000 mg | Freq: Once | INTRAMUSCULAR | Status: AC
  Administered 2021-06-08: 50 mg via INTRAVENOUS
  Filled 2021-06-08: qty 1

## 2021-06-08 MED ORDER — PALONOSETRON HCL INJECTION 0.25 MG/5ML
0.2500 mg | Freq: Once | INTRAVENOUS | Status: AC
  Administered 2021-06-08: 0.25 mg via INTRAVENOUS
  Filled 2021-06-08: qty 5

## 2021-06-08 MED ORDER — HEPARIN SOD (PORK) LOCK FLUSH 100 UNIT/ML IV SOLN
INTRAVENOUS | Status: AC
  Filled 2021-06-08: qty 5

## 2021-06-08 MED ORDER — SODIUM CHLORIDE 0.9 % IV SOLN
Freq: Once | INTRAVENOUS | Status: AC
  Filled 2021-06-08: qty 250

## 2021-06-08 MED ORDER — SODIUM CHLORIDE 0.9% FLUSH
10.0000 mL | Freq: Once | INTRAVENOUS | Status: AC
  Administered 2021-06-08: 10 mL via INTRAVENOUS
  Filled 2021-06-08: qty 10

## 2021-06-08 MED ORDER — HEPARIN SOD (PORK) LOCK FLUSH 100 UNIT/ML IV SOLN
500.0000 [IU] | Freq: Once | INTRAVENOUS | Status: AC
Start: 2021-06-08 — End: 2021-06-08
  Administered 2021-06-08: 500 [IU] via INTRAVENOUS
  Filled 2021-06-08: qty 5

## 2021-06-08 MED ORDER — SODIUM CHLORIDE 0.9 % IV SOLN
80.0000 mg/m2 | Freq: Once | INTRAVENOUS | Status: AC
  Administered 2021-06-08: 168 mg via INTRAVENOUS
  Filled 2021-06-08: qty 28

## 2021-06-08 MED ORDER — SODIUM CHLORIDE 0.9 % IV SOLN
200.0000 mg | Freq: Once | INTRAVENOUS | Status: AC
  Administered 2021-06-08: 200 mg via INTRAVENOUS
  Filled 2021-06-08: qty 8

## 2021-06-08 MED ORDER — SODIUM CHLORIDE 0.9 % IV SOLN
150.0000 mg | Freq: Once | INTRAVENOUS | Status: AC
  Administered 2021-06-08: 150 mg via INTRAVENOUS
  Filled 2021-06-08: qty 150

## 2021-06-08 MED ORDER — SODIUM CHLORIDE 0.9 % IV SOLN
722.0000 mg | Freq: Once | INTRAVENOUS | Status: AC
  Administered 2021-06-08: 720 mg via INTRAVENOUS
  Filled 2021-06-08: qty 72

## 2021-06-08 MED ORDER — SODIUM CHLORIDE 0.9 % IV SOLN
10.0000 mg | Freq: Once | INTRAVENOUS | Status: AC
  Administered 2021-06-08: 10 mg via INTRAVENOUS
  Filled 2021-06-08: qty 10

## 2021-06-08 MED ORDER — FAMOTIDINE 20 MG IN NS 100 ML IVPB
20.0000 mg | Freq: Once | INTRAVENOUS | Status: AC
  Administered 2021-06-08: 20 mg via INTRAVENOUS
  Filled 2021-06-08: qty 20

## 2021-06-08 NOTE — Progress Notes (Signed)
Pt received first time pembro, taxol, carbo infusion in clinic today. Tolerated well. No complaints at d/c.

## 2021-06-08 NOTE — Patient Instructions (Signed)
[facility]    Discharge Instructions: Thank you for choosing [facility] to provide your oncology and hematology care.  If you have a lab appointment with the [facility], please go directly to the [facility] and check in at the registration area.  Wear comfortable clothing and clothing appropriate for easy access to any Portacath or PICC line.   We strive to give you quality time with your provider. You may need to reschedule your appointment if you arrive late (15 or more minutes).  Arriving late affects you and other patients whose appointments are after yours.  Also, if you miss three or more appointments without notifying the office, you may be dismissed from the clinic at the provider's discretion.      For prescription refill requests, have your pharmacy contact our office and allow 72 hours for refills to be completed.    Today you received the following chemotherapy and/or immunotherapy agents - Keytruda, Taxol, Carboplatin      To help prevent nausea and vomiting after your treatment, we encourage you to take your nausea medication as directed.  BELOW ARE SYMPTOMS THAT SHOULD BE REPORTED IMMEDIATELY: *FEVER GREATER THAN 100.4 F (38 C) OR HIGHER *CHILLS OR SWEATING *NAUSEA AND VOMITING THAT IS NOT CONTROLLED WITH YOUR NAUSEA MEDICATION *UNUSUAL SHORTNESS OF BREATH *UNUSUAL BRUISING OR BLEEDING *URINARY PROBLEMS (pain or burning when urinating, or frequent urination) *BOWEL PROBLEMS (unusual diarrhea, constipation, pain near the anus) TENDERNESS IN MOUTH AND THROAT WITH OR WITHOUT PRESENCE OF ULCERS (sore throat, sores in mouth, or a toothache) UNUSUAL RASH, SWELLING OR PAIN  UNUSUAL VAGINAL DISCHARGE OR ITCHING   Items with * indicate a potential emergency and should be followed up as soon as possible or go to the Emergency Department if any problems should occur.  Please show the CHEMOTHERAPY ALERT CARD or IMMUNOTHERAPY  ALERT CARD at check-in to the Emergency Department and triage nurse.  Should you have questions after your visit or need to cancel or reschedule your appointment, please contact [facility]  [phone] and follow the prompts.  Office hours are 8:00 a.m. to 4:30 p.m. Monday - Friday. Please note that voicemails left after 4:00 p.m. may not be returned until the following business day.  We are closed weekends and major holidays. You have access to a nurse at all times for urgent questions. Please call the main number to the clinic [phone] and follow the prompts.  For any non-urgent questions, you may also contact your provider using MyChart. We now offer e-Visits for anyone 32 and older to request care online for non-urgent symptoms. For details visit [email or web address].   Also download the MyChart app! Go to the app store, search "MyChart", open the app, select [facility], and log in with your MyChart username and password.  Due to Covid, a mask is required upon entering the hospital/clinic. If you do not have a mask, one will be given to you upon arrival. For doctor visits, patients may have 1 support person aged 66 or older with them. For treatment visits, patients cannot have anyone with them due to current Covid guidelines and our immunocompromised population.   Diphenhydramine Injection What is this medication? DIPHENHYDRAMINE (dye fen HYE dra meen) treats the symptoms of allergic reactions. It may also be used to prevent and treat motion sickness or the symptoms of Parkinson disease. It works by blocking histamine, a substance released by the body during an allergic reaction.  It belongs to a group ofmedications called antihistamines. This medicine may be used for other purposes; ask your health care provider orpharmacist if you have questions. COMMON BRAND NAME(S): Benadryl What should I tell my care team before I take this medication? They need  to know if you have any of these conditions: Asthma or lung disease Glaucoma High blood pressure or heart disease Liver disease Pain or difficulty passing urine Prostate trouble Ulcers or other stomach problems An unusual or allergic reaction to diphenhydramine, antihistamines, other medications foods, dyes, or preservatives Pregnant or trying to get pregnant Breast-feeding How should I use this medication? This medication is for injection into a vein or a muscle. It is usually givenin a hospital or clinic. If you get this medication at home, you will be taught how to prepare and give this medication. Use exactly as directed. Take your medication at regularintervals. Do not take your medication more often than directed. It is important that you put your used needles and syringes in a special sharps container. Do not put them in a trash can. If you do not have a sharpscontainer, call your pharmacist or care team to get one. Talk to your care team regarding the use of this medication in children. While this medication may be prescribed for selected conditions, precautions doapply. This medication is not approved for use in newborns and premature babies. Patients over 63 years old may have a stronger reaction and need a smaller dose. Overdosage: If you think you have taken too much of this medicine contact apoison control center or emergency room at once. NOTE: This medicine is only for you. Do not share this medicine with others. What if I miss a dose? If you miss a dose, take it as soon as you can. If it is almost time for yournext dose, take only that dose. Do not take double or extra doses. What may interact with this medication? Do not take this medication with any of the following: MAOIs like Carbex, Eldepryl, Marplan, Nardil, and Parnate This medication may also interact with the following: Alcohol Barbiturates, like phenobarbital Medications for bladder spasm like oxybutynin,  tolterodine Medications for blood pressure Medications for depression, anxiety, or psychotic disturbances Medications for movement abnormalities or Parkinson disease Medications for sleep Other medications for cold, cough or allergy Some medications for the stomach like chlordiazepoxide, dicyclomine This list may not describe all possible interactions. Give your health care provider a list of all the medicines, herbs, non-prescription drugs, or dietary supplements you use. Also tell them if you smoke, drink alcohol, or use illegaldrugs. Some items may interact with your medicine. What should I watch for while using this medication? Your condition will be monitored carefully while you are receiving this medication. Tell your care team if your symptoms do not start to get better orif they get worse. You may get drowsy or dizzy. Do not drive, use machinery, or do anything that needs mental alertness until you know how this medication affects you. Do not stand or sit up quickly, especially if you are an older patient. This reduces the risk of dizzy or fainting spells. Alcohol may interfere with the effect ofthis medication. Avoid alcoholic drinks. Your mouth may get dry. Chewing sugarless gum or sucking hard candy, and drinking plenty of water may help. Contact your care team if the problem doesnot go away or is severe. What side effects may I notice from receiving this medication? Side effects that you should report to your care team as  soon as possible: Allergic reactions-skin rash, itching, hives, swelling of the face, lips, tongue, or throat Sudden eye pain or change in vision such as blurry vision, seeing halos around lights, vision loss Trouble passing urine Side effects that usually do not require medical attention (report to your careteam if they continue or are bothersome): Constipation Drowsiness Dry mouth Headache Upset stomach This list may not describe all possible side effects. Call  your doctor for medical advice about side effects. You may report side effects to FDA at1-800-FDA-1088. Where should I keep my medication? Keep out of the reach of children and pets. If you are using this medication at home, you will be instructed on how to store this medication. Throw away any unused medication after the expirationdate on the label. NOTE: This sheet is a summary. It may not cover all possible information. If you have questions about this medicine, talk to your doctor, pharmacist, orhealth care provider.  2022 Elsevier/Gold Standard (2021-01-01 11:52:56)  Palonosetron Injection What is this medication? PALONOSETRON (pal oh NOE se tron) is used to prevent nausea and vomiting caused by chemotherapy. It also helps prevent delayed nausea and vomiting that mayoccur a few days after your treatment. This medicine may be used for other purposes; ask your health care provider orpharmacist if you have questions. COMMON BRAND NAME(S): Aloxi What should I tell my care team before I take this medication? They need to know if you have any of these conditions: an unusual or allergic reaction to palonosetron, dolasetron, granisetron, ondansetron, other medicines, foods, dyes, or preservatives pregnant or trying to get pregnant breast-feeding How should I use this medication? This medicine is for infusion into a vein. It is given by a health careprofessional in a hospital or clinic setting. Talk to your pediatrician regarding the use of this medicine in children. While this drug may be prescribed for children as young as 1 month for selectedconditions, precautions do apply. Overdosage: If you think you have taken too much of this medicine contact apoison control center or emergency room at once. NOTE: This medicine is only for you. Do not share this medicine with others. What if I miss a dose? This does not apply. What may interact with this medication? certain medicines for depression,  anxiety, or psychotic disturbances fentanyl linezolid MAOIs like Carbex, Eldepryl, Marplan, Nardil, and Parnate methylene blue (injected into a vein) tramadol This list may not describe all possible interactions. Give your health care provider a list of all the medicines, herbs, non-prescription drugs, or dietary supplements you use. Also tell them if you smoke, drink alcohol, or use illegaldrugs. Some items may interact with your medicine. What should I watch for while using this medication? Your condition will be monitored carefully while you are receiving thismedicine. What side effects may I notice from receiving this medication? Side effects that you should report to your doctor or health care professionalas soon as possible: allergic reactions like skin rash, itching or hives, swelling of the face, lips, or tongue breathing problems confusion dizziness fast, irregular heartbeat fever and chills loss of balance or coordination seizures sweating swelling of the hands and feet tremors unusually weak or tired Side effects that usually do not require medical attention (report to yourdoctor or health care professional if they continue or are bothersome): constipation or diarrhea headache This list may not describe all possible side effects. Call your doctor for medical advice about side effects. You may report side effects to FDA at1-800-FDA-1088. Where should I keep my medication?  This drug is given in a hospital or clinic and will not be stored at home. NOTE: This sheet is a summary. It may not cover all possible information. If you have questions about this medicine, talk to your doctor, pharmacist, or health care provider.  2022 Elsevier/Gold Standard (2013-10-11 10:38:36)  Dexamethasone injection What is this medication? DEXAMETHASONE (dex a METH a sone) is a corticosteroid. It is used to treat inflammation of the skin, joints, lungs, and other organs. Common conditions  treated include asthma, allergies, and arthritis. It is also used for otherconditions, like blood disorders and diseases of the adrenal glands. This medicine may be used for other purposes; ask your health care provider orpharmacist if you have questions. COMMON BRAND NAME(S): Decadron, DoubleDex, ReadySharp Dexamethasone, SimplistDexamethasone, Solurex What should I tell my care team before I take this medication? They need to know if you have any of these conditions: Cushing's syndrome diabetes glaucoma heart disease high blood pressure infection like herpes, measles, tuberculosis, or chickenpox kidney disease liver disease mental illness myasthenia gravis osteoporosis previous heart attack seizures stomach or intestine problems thyroid disease an unusual or allergic reaction to dexamethasone, corticosteroids, other medicines, lactose, foods, dyes, or preservatives pregnant or trying to get pregnant breast-feeding How should I use this medication? This medicine is for injection into a muscle, joint, lesion, soft tissue, orvein. It is given by a health care professional in a hospital or clinic setting. Talk to your pediatrician regarding the use of this medicine in children.Special care may be needed. Overdosage: If you think you have taken too much of this medicine contact apoison control center or emergency room at once. NOTE: This medicine is only for you. Do not share this medicine with others. What if I miss a dose? This may not apply. If you are having a series of injections over a prolonged period, try not to miss an appointment. Call your doctor or health careprofessional to reschedule if you are unable to keep an appointment. What may interact with this medication? Do not take this medicine with any of the following medications: live virus vaccines This medicine may also interact with the following medications: aminoglutethimide amphotericin B aspirin and aspirin-like  medicines certain antibiotics like erythromycin, clarithromycin, and troleandomycin certain antivirals for HIV or hepatitis certain medicines for seizures like carbamazepine, phenobarbital, phenytoin certain medicines to treat myasthenia gravis cholestyramine cyclosporine digoxin diuretics ephedrine female hormones, like estrogen or progestins and birth control pills insulin or other medicines for diabetes isoniazid ketoconazole medicines that relax muscles for surgery mifepristone NSAIDs, medicines for pain and inflammation, like ibuprofen or naproxen rifampin skin tests for allergies thalidomide vaccines warfarin This list may not describe all possible interactions. Give your health care provider a list of all the medicines, herbs, non-prescription drugs, or dietary supplements you use. Also tell them if you smoke, drink alcohol, or use illegaldrugs. Some items may interact with your medicine. What should I watch for while using this medication? Visit your health care professional for regular checks on your progress. Tell your health care professional if your symptoms do not start to get better or if they get worse. Your condition will be monitored carefully while you arereceiving this medicine. Wear a medical ID bracelet or chain. Carry a card that describes your diseaseand details of your medicine and dosage times. This medicine may increase your risk of getting an infection. Call your health care professional for advice if you get a fever, chills, or sore throat, or other symptoms of a cold  or flu. Do not treat yourself. Try to avoid being around people who are sick. Call your health care professional if you are around anyone with measles, chickenpox, or if you develop sores or blistersthat do not heal properly. If you are going to need surgery or other procedures, tell your doctor or health care professional that you have taken this medicine within the last 67months. Ask your doctor  or health care professional about your diet. You may need tolower the amount of salt you eat. This medicine may increase blood sugar. Ask your healthcare provider if changesin diet or medicines are needed if you have diabetes. What side effects may I notice from receiving this medication? Side effects that you should report to your doctor or health care professionalas soon as possible: allergic reactions like skin rash, itching or hives, swelling of the face, lips, or tongue bloody or black, tarry stools changes in emotions or moods changes in vision confusion, excitement, restlessness depressed mood eye pain hallucinations muscle weakness severe or sudden stomach or belly pain signs and symptoms of high blood sugar such as being more thirsty or hungry or having to urinate more than normal. You may also feel very tired or have blurry vision. signs and symptoms of infection like fever; chills; cough; sore throat; pain or trouble passing urine swelling of ankles, feet unusual bruising or bleeding wounds that do not heal Side effects that usually do not require medical attention (report to yourdoctor or health care professional if they continue or are bothersome): increased appetite increased growth of face or body hair headache nausea, vomiting pain, redness, or irritation at site where injected skin problems, acne, thin and shiny skin trouble sleeping weight gain This list may not describe all possible side effects. Call your doctor for medical advice about side effects. You may report side effects to FDA at1-800-FDA-1088. Where should I keep my medication? This medicine is given in a hospital or clinic and will not be stored at home. NOTE: This sheet is a summary. It may not cover all possible information. If you have questions about this medicine, talk to your doctor, pharmacist, orhealth care provider.  2022 Elsevier/Gold Standard (2019-06-18 13:51:58)  Famotidine Solution for  Injection What is this medication? FAMOTIDINE (fa MOE ti deen) treats stomach ulcers, reflux disease, or other conditions that cause too much stomach acid. It works by reducing the amount ofacid in the stomach. This medicine may be used for other purposes; ask your health care provider orpharmacist if you have questions. COMMON BRAND NAME(S): Pepcid What should I tell my care team before I take this medication? They need to know if you have any of these conditions: Kidney or liver disease An unusual or allergic reaction to famotidine, other medications, foods, dyes, or preservatives Pregnant or trying to get pregnant Breast-feeding How should I use this medication? This medication is for infusion into a vein. It is given in a hospital orclinic setting. Talk to your care team regarding the use of this medication in children.Special care may be needed. Overdosage: If you think you have taken too much of this medicine contact apoison control center or emergency room at once. NOTE: This medicine is only for you. Do not share this medicine with others. What if I miss a dose? This does not apply. What may interact with this medication? Delavirdine Itraconazole Ketoconazole This list may not describe all possible interactions. Give your health care provider a list of all the medicines, herbs, non-prescription drugs, or dietary supplements  you use. Also tell them if you smoke, drink alcohol, or use illegaldrugs. Some items may interact with your medicine. What should I watch for while using this medication? Tell your doctor or health care professional if your condition does not startto get better or gets worse. Do not take with aspirin, ibuprofen, or other antiinflammatory medications.These can aggravate your condition. Do not smoke cigarettes or drink alcohol. These increase irritation in your stomach and can increase the time it will take for ulcers to heal. Cigarettesand alcohol can also worsen  acid reflux or heartburn. If you get black, tarry stools or vomit up what looks like coffee grounds, callyour doctor or health care professional at once. You may have a bleeding ulcer. This medication may cause a decrease in vitamin B12. Make sure that you get enough vitamin B12 while you are taking this medication. Discuss the foods youeat and the vitamins you take with your care team. What side effects may I notice from receiving this medication? Side effects that you should report to your care team as soon as possible: Allergic reactions-skin rash, itching, hives, swelling of the face, lips, tongue, or throat Confusion Hallucinations Side effects that usually do not require medical attention (report to your careteam if they continue or are bothersome): Constipation Diarrhea Dizziness Headache This list may not describe all possible side effects. Call your doctor for medical advice about side effects. You may report side effects to FDA at1-800-FDA-1088. Where should I keep my medication? This medication is given in a hospital or clinic. You will not be given thismedication to store at home. NOTE: This sheet is a summary. It may not cover all possible information. If you have questions about this medicine, talk to your doctor, pharmacist, orhealth care provider.  2022 Elsevier/Gold Standard (2020-10-21 10:05:26)  Fosaprepitant injection What is this medication? FOSAPREPITANT (fos ap RE pi tant) is used together with other medicines toprevent nausea and vomiting caused by cancer treatment (chemotherapy). This medicine may be used for other purposes; ask your health care provider orpharmacist if you have questions. COMMON BRAND NAME(S): Emend What should I tell my care team before I take this medication? They need to know if you have any of these conditions: liver disease an unusual or allergic reaction to fosaprepitant, aprepitant, medicines, foods, dyes, or preservatives pregnant or  trying to get pregnant breast-feeding How should I use this medication? This medicine is for injection into a vein. It is given by a health careprofessional in a hospital or clinic setting. Talk to your pediatrician regarding the use of this medicine in children. While this drug may be prescribed for children as young as 6 months for selectedconditions, precautions do apply. Overdosage: If you think you have taken too much of this medicine contact apoison control center or emergency room at once. NOTE: This medicine is only for you. Do not share this medicine with others. What if I miss a dose? This does not apply. What may interact with this medication? Do not take this medicine with any of these medicines: cisapride flibanserin lomitapide pimozide This medicine may also interact with the following medications: diltiazem female hormones, like estrogens or progestins and birth control pills medicines for fungal infections like ketoconazole and itraconazole medicines for HIV medicines for seizures or to control epilepsy like carbamazepine or phenytoin medicines used for sleep or anxiety disorders like alprazolam, diazepam, or midazolam nefazodone paroxetine ranolazine rifampin some chemotherapy medications like etoposide, ifosfamide, vinblastine, vincristine some antibiotics like clarithromycin, erythromycin, troleandomycin steroid medicines like  dexamethasone or methylprednisolone tolbutamide warfarin This list may not describe all possible interactions. Give your health care provider a list of all the medicines, herbs, non-prescription drugs, or dietary supplements you use. Also tell them if you smoke, drink alcohol, or use illegaldrugs. Some items may interact with your medicine. What should I watch for while using this medication? Do not take this medicine if you already have nausea and vomiting. Ask yourhealth care provider what to do if you already have nausea. Birth control  pills and other methods of hormonal contraception (for example, IUD or patch) may not work properly while you are taking this medicine. Use an extra method of birth control during treatment and for 1 month after your lastdose of fosaprepitant. This medicine should not be used continuously for a long time. Visit your doctor or health care professional for regular check-ups. Thismedicine may change your liver function blood test results. What side effects may I notice from receiving this medication? Side effects that you should report to your doctor or health care professionalas soon as possible: allergic reactions like skin rash, itching or hives, swelling of the face, lips, or tongue breathing problems changes in heart rhythm high or low blood pressure pain, redness, or irritation at site where injected rectal bleeding serious dizziness or disorientation, confusion sharp or severe stomach pain sharp pain in your leg Side effects that usually do not require medical attention (report to yourdoctor or health care professional if they continue or are bothersome): constipation or diarrhea hair loss headache hiccups loss of appetite nausea upset stomach tiredness This list may not describe all possible side effects. Call your doctor for medical advice about side effects. You may report side effects to FDA at1-800-FDA-1088. Where should I keep my medication? This drug is given in a hospital or clinic and will not be stored at home. NOTE: This sheet is a summary. It may not cover all possible information. If you have questions about this medicine, talk to your doctor, pharmacist, orhealth care provider.  2022 Elsevier/Gold Standard (2017-03-23 12:55:48)  Pembrolizumab injection What is this medication? PEMBROLIZUMAB (pem broe liz ue mab) is a monoclonal antibody. It is used totreat certain types of cancer. This medicine may be used for other purposes; ask your health care provider  orpharmacist if you have questions. COMMON BRAND NAME(S): Keytruda What should I tell my care team before I take this medication? They need to know if you have any of these conditions: autoimmune diseases like Crohn's disease, ulcerative colitis, or lupus have had or planning to have an allogeneic stem cell transplant (uses someone else's stem cells) history of organ transplant history of chest radiation nervous system problems like myasthenia gravis or Guillain-Barre syndrome an unusual or allergic reaction to pembrolizumab, other medicines, foods, dyes, or preservatives pregnant or trying to get pregnant breast-feeding How should I use this medication? This medicine is for infusion into a vein. It is given by a health careprofessional in a hospital or clinic setting. A special MedGuide will be given to you before each treatment. Be sure to readthis information carefully each time. Talk to your pediatrician regarding the use of this medicine in children. While this drug may be prescribed for children as young as 6 months for selectedconditions, precautions do apply. Overdosage: If you think you have taken too much of this medicine contact apoison control center or emergency room at once. NOTE: This medicine is only for you. Do not share this medicine with others. What if I miss a  dose? It is important not to miss your dose. Call your doctor or health careprofessional if you are unable to keep an appointment. What may interact with this medication? Interactions have not been studied. This list may not describe all possible interactions. Give your health care provider a list of all the medicines, herbs, non-prescription drugs, or dietary supplements you use. Also tell them if you smoke, drink alcohol, or use illegaldrugs. Some items may interact with your medicine. What should I watch for while using this medication? Your condition will be monitored carefully while you are receiving  thismedicine. You may need blood work done while you are taking this medicine. Do not become pregnant while taking this medicine or for 4 months after stopping it. Women should inform their doctor if they wish to become pregnant or think they might be pregnant. There is a potential for serious side effects to an unborn child. Talk to your health care professional or pharmacist for more information. Do not breast-feed an infant while taking this medicine orfor 4 months after the last dose. What side effects may I notice from receiving this medication? Side effects that you should report to your doctor or health care professionalas soon as possible: allergic reactions like skin rash, itching or hives, swelling of the face, lips, or tongue bloody or black, tarry breathing problems changes in vision chest pain chills confusion constipation cough diarrhea dizziness or feeling faint or lightheaded fast or irregular heartbeat fever flushing joint pain low blood counts - this medicine may decrease the number of Jakya Dovidio blood cells, red blood cells and platelets. You may be at increased risk for infections and bleeding. muscle pain muscle weakness pain, tingling, numbness in the hands or feet persistent headache redness, blistering, peeling or loosening of the skin, including inside the mouth signs and symptoms of high blood sugar such as dizziness; dry mouth; dry skin; fruity breath; nausea; stomach pain; increased hunger or thirst; increased urination signs and symptoms of kidney injury like trouble passing urine or change in the amount of urine signs and symptoms of liver injury like dark urine, light-colored stools, loss of appetite, nausea, right upper belly pain, yellowing of the eyes or skin sweating swollen lymph nodes weight loss Side effects that usually do not require medical attention (report to yourdoctor or health care professional if they continue or are bothersome): decreased  appetite hair loss tiredness This list may not describe all possible side effects. Call your doctor for medical advice about side effects. You may report side effects to FDA at1-800-FDA-1088. Where should I keep my medication? This drug is given in a hospital or clinic and will not be stored at home. NOTE: This sheet is a summary. It may not cover all possible information. If you have questions about this medicine, talk to your doctor, pharmacist, orhealth care provider.  2022 Elsevier/Gold Standard (2019-11-06 21:44:53)  Paclitaxel injection What is this medication? PACLITAXEL (PAK li TAX el) is a chemotherapy drug. It targets fast dividing cells, like cancer cells, and causes these cells to die. This medicine is used to treat ovarian cancer, breast cancer, lung cancer, Kaposi's sarcoma, andother cancers. This medicine may be used for other purposes; ask your health care provider orpharmacist if you have questions. COMMON BRAND NAME(S): Onxol, Taxol What should I tell my care team before I take this medication? They need to know if you have any of these conditions: history of irregular heartbeat liver disease low blood counts, like low Garvin Ellena cell, platelet, or red  cell counts lung or breathing disease, like asthma tingling of the fingers or toes, or other nerve disorder an unusual or allergic reaction to paclitaxel, alcohol, polyoxyethylated castor oil, other chemotherapy, other medicines, foods, dyes, or preservatives pregnant or trying to get pregnant breast-feeding How should I use this medication? This drug is given as an infusion into a vein. It is administered in a hospitalor clinic by a specially trained health care professional. Talk to your pediatrician regarding the use of this medicine in children.Special care may be needed. Overdosage: If you think you have taken too much of this medicine contact apoison control center or emergency room at once. NOTE: This medicine is only  for you. Do not share this medicine with others. What if I miss a dose? It is important not to miss your dose. Call your doctor or health careprofessional if you are unable to keep an appointment. What may interact with this medication? Do not take this medicine with any of the following medications: live virus vaccines This medicine may also interact with the following medications: antiviral medicines for hepatitis, HIV or AIDS certain antibiotics like erythromycin and clarithromycin certain medicines for fungal infections like ketoconazole and itraconazole certain medicines for seizures like carbamazepine, phenobarbital, phenytoin gemfibrozil nefazodone rifampin St. John's wort This list may not describe all possible interactions. Give your health care provider a list of all the medicines, herbs, non-prescription drugs, or dietary supplements you use. Also tell them if you smoke, drink alcohol, or use illegaldrugs. Some items may interact with your medicine. What should I watch for while using this medication? Your condition will be monitored carefully while you are receiving this medicine. You will need important blood work done while you are taking thismedicine. This medicine can cause serious allergic reactions. To reduce your risk you will need to take other medicine(s) before treatment with this medicine. If you experience allergic reactions like skin rash, itching or hives, swelling of theface, lips, or tongue, tell your doctor or health care professional right away. In some cases, you may be given additional medicines to help with side effects.Follow all directions for their use. This drug may make you feel generally unwell. This is not uncommon, as chemotherapy can affect healthy cells as well as cancer cells. Report any side effects. Continue your course of treatment even though you feel ill unless yourdoctor tells you to stop. Call your doctor or health care professional for advice  if you get a fever, chills or sore throat, or other symptoms of a cold or flu. Do not treat yourself. This drug decreases your body's ability to fight infections. Try toavoid being around people who are sick. This medicine may increase your risk to bruise or bleed. Call your doctor orhealth care professional if you notice any unusual bleeding. Be careful brushing and flossing your teeth or using a toothpick because you may get an infection or bleed more easily. If you have any dental work done,tell your dentist you are receiving this medicine. Avoid taking products that contain aspirin, acetaminophen, ibuprofen, naproxen, or ketoprofen unless instructed by your doctor. These medicines may hide afever. Do not become pregnant while taking this medicine. Women should inform their doctor if they wish to become pregnant or think they might be pregnant. There is a potential for serious side effects to an unborn child. Talk to your health care professional or pharmacist for more information. Do not breast-feed aninfant while taking this medicine. Men are advised not to father a child while receiving  this medicine. This product may contain alcohol. Ask your pharmacist or healthcare provider if this medicine contains alcohol. Be sure to tell all healthcare providers you are taking this medicine. Certain medicines, like metronidazole and disulfiram, can cause an unpleasant reaction when taken with alcohol. The reaction includes flushing, headache, nausea, vomiting, sweating, and increased thirst. Thereaction can last from 30 minutes to several hours. What side effects may I notice from receiving this medication? Side effects that you should report to your doctor or health care professionalas soon as possible: allergic reactions like skin rash, itching or hives, swelling of the face, lips, or tongue breathing problems changes in vision fast, irregular heartbeat high or low blood pressure mouth sores pain,  tingling, numbness in the hands or feet signs of decreased platelets or bleeding - bruising, pinpoint red spots on the skin, black, tarry stools, blood in the urine signs of decreased red blood cells - unusually weak or tired, feeling faint or lightheaded, falls signs of infection - fever or chills, cough, sore throat, pain or difficulty passing urine signs and symptoms of liver injury like dark yellow or brown urine; general ill feeling or flu-like symptoms; light-colored stools; loss of appetite; nausea; right upper belly pain; unusually weak or tired; yellowing of the eyes or skin swelling of the ankles, feet, hands unusually slow heartbeat Side effects that usually do not require medical attention (report to yourdoctor or health care professional if they continue or are bothersome): diarrhea hair loss loss of appetite muscle or joint pain nausea, vomiting pain, redness, or irritation at site where injected tiredness This list may not describe all possible side effects. Call your doctor for medical advice about side effects. You may report side effects to FDA at1-800-FDA-1088. Where should I keep my medication? This drug is given in a hospital or clinic and will not be stored at home. NOTE: This sheet is a summary. It may not cover all possible information. If you have questions about this medicine, talk to your doctor, pharmacist, orhealth care provider.  2022 Elsevier/Gold Standard (2019-11-06 13:37:23)  Carboplatin injection What is this medication? CARBOPLATIN (KAR boe pla tin) is a chemotherapy drug. It targets fast dividing cells, like cancer cells, and causes these cells to die. This medicine is usedto treat ovarian cancer and many other cancers. This medicine may be used for other purposes; ask your health care provider orpharmacist if you have questions. COMMON BRAND NAME(S): Paraplatin What should I tell my care team before I take this medication? They need to know if you  have any of these conditions: blood disorders hearing problems kidney disease recent or ongoing radiation therapy an unusual or allergic reaction to carboplatin, cisplatin, other chemotherapy, other medicines, foods, dyes, or preservatives pregnant or trying to get pregnant breast-feeding How should I use this medication? This drug is usually given as an infusion into a vein. It is administered in [place] or clinic by a specially trained health care professional. Talk to your pediatrician regarding the use of this medicine in children.Special care may be needed. Overdosage: If you think you have taken too much of this medicine contact apoison control center or emergency room at once. NOTE: This medicine is only for you. Do not share this medicine with others. What if I miss a dose? It is important not to miss a dose. Call your doctor or health careprofessional if you are unable to keep an appointment. What may interact with this medication? medicines for seizures medicines to increase blood counts like  filgrastim, pegfilgrastim, sargramostim some antibiotics like amikacin, gentamicin, neomycin, streptomycin, tobramycin vaccines Talk to your doctor or health care professional before taking any of thesemedicines: acetaminophen aspirin ibuprofen ketoprofen naproxen This list may not describe all possible interactions. Give your health care provider a list of all the medicines, herbs, non-prescription drugs, or dietary supplements you use. Also tell them if you smoke, drink alcohol, or use illegaldrugs. Some items may interact with your medicine. What should I watch for while using this medication? Your condition will be monitored carefully while you are receiving this medicine. You will need important blood work done while you are taking thismedicine. This drug may make you feel generally unwell. This is not uncommon, as chemotherapy can affect healthy cells as well as cancer cells.  Report any side effects. Continue your course of treatment even though you feel ill unless yourdoctor tells you to stop. In some cases, you may be given additional medicines to help with side effects.Follow all directions for their use. Call your doctor or health care professional for advice if you get a fever, chills or sore throat, or other symptoms of a cold or flu. Do not treat yourself. This drug decreases your body's ability to fight infections. Try toavoid being around people who are sick. This medicine may increase your risk to bruise or bleed. Call your doctor orhealth care professional if you notice any unusual bleeding. Be careful brushing and flossing your teeth or using a toothpick because you may get an infection or bleed more easily. If you have any dental work done,tell your dentist you are receiving this medicine. Avoid taking products that contain aspirin, acetaminophen, ibuprofen, naproxen, or ketoprofen unless instructed by your doctor. These medicines may hide afever. Do not become pregnant while taking this medicine. Women should inform their doctor if they wish to become pregnant or think they might be pregnant. There is a potential for serious side effects to an unborn child. Talk to your health care professional or pharmacist for more information. Do not breast-feed aninfant while taking this medicine. What side effects may I notice from receiving this medication? Side effects that you should report to your doctor or health care professionalas soon as possible: allergic reactions like skin rash, itching or hives, swelling of the face, lips, or tongue signs of infection - fever or chills, cough, sore throat, pain or difficulty passing urine signs of decreased platelets or bleeding - bruising, pinpoint red spots on the skin, black, tarry stools, nosebleeds signs of decreased red blood cells - unusually weak or tired, fainting spells, lightheadedness breathing problems changes in  hearing changes in vision chest pain high blood pressure low blood counts - This drug may decrease the number of Odella Appelhans blood cells, red blood cells and platelets. You may be at increased risk for infections and bleeding. nausea and vomiting pain, swelling, redness or irritation at the injection site pain, tingling, numbness in the hands or feet problems with balance, talking, walking trouble passing urine or change in the amount of urine Side effects that usually do not require medical attention (report to yourdoctor or health care professional if they continue or are bothersome): hair loss loss of appetite metallic taste in the mouth or changes in taste This list may not describe all possible side effects. Call your doctor for medical advice about side effects. You may report side effects to FDA at1-800-FDA-1088. Where should I keep my medication? This drug is given in a hospital or clinic and will not be stored  at home. NOTE: This sheet is a summary. It may not cover all possible information. If you have questions about this medicine, talk to your doctor, pharmacist, orhealth care provider.  2022 Elsevier/Gold Standard (2008-03-11 14:38:05)

## 2021-06-08 NOTE — Progress Notes (Signed)
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  female with PMH listed below was seen in consultation at the request of  Renee Rival, NP  for evaluation of triple negative breast cancer.  03/30/2021, screening mammogram with 3D 2 nodular areas of asymmetric density demonstrated within the superior lateral aspect of the left breast middle third depth.  Finding was best appreciated on 3D tomosynthesis imaging. Targeted ultrasound showed left upper outer breast 1:30 position 1.1 cm round hypoechoic mass with angular margins. 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 Bary Castilla yesterday.  She presents to establish care with me today Patient did not notice this left mass prior to the mammogram.   Case was discussed on Tumor board.  Pathology reports and mammogram/ultrasound were done at outside facility and results were reviewed and discussed with patient and her husband.  LN status was not mentioned on her [place]. Recommend additional images with mammogram and MRI, neoadjuvant chemotherapy  Family history of breast cancer: Breast cancer in her sister, her late 68s, early 57s; and being paternal grandmother Menarche: 52 Age at first live childbirth: 25  patient has 2 sons and 1 daughter.  Daughter has passed away Used OCP:  Used estrogen and progesterone therapy: Denies History of Radiation to the chest: Denies Previous of breast biopsy: Denies  INTERVAL HISTORY Peggy Bates is a 55 y.o. female who has above  history reviewed by me today presents for follow up visit for management of left triple negative breast cancer Problems and complaints are listed below:  05/12/2021 diagnostic mammogram of left breast showed 2.1 cm biopsy-proven malignancy in the lateral left breast at 2:00, 4 cm from nipple.  Directly adjacent cystic mass, 1.5 cm in size, consistent with biopsy hematoma.  1 abnormal and 1 borderline abnormal left axillary lymph node  05/18/2021 bilateral breast MRI with and without contrast showed 2.5 x 3 x 2.5 cm hematoma containing biopsy clip artifact within the upper outer left breast.  1.6 cm nodular non-mass-like enhancement 2.5 cm posterior/superior to the biopsy clip may represent biopsy-proven malignancy or additional areas of malignancy.  Single abnormal appearing left axillary lymph node with eccentric cortical thickening.  No MRI evidence of the right breast malignancy.  #05/07/2021, Mediport was placed by Dr. Bary Castilla #05/14/2021 echocardiogram showed LVEF 60-55%  05/20/2021 Slide consultation of her left breast biopsy from outside institution - invasive mammary carcinoma, no special type, grade 3, DCIS and LVI not identified. ER negative, PR negative, HER2 IHC negative. Ki 67 70-80%    05/20/2021 ultrasound-guided left axillary lymph node was negative for malignancy.  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 She has no new concerns except that left breast mass area has increasing size after biopsy.  Review of Systems  Constitutional:  Negative for appetite change, chills, fatigue and fever.  HENT:   Negative for hearing loss and voice change.   Eyes:  Negative for eye problems.  Respiratory:  Negative for chest tightness and cough.   Cardiovascular:  Negative for chest pain.  Gastrointestinal:  Negative for  abdominal distention, abdominal pain and blood in stool.  Endocrine: Negative for hot flashes.  Genitourinary:  Negative for difficulty urinating and frequency.   Musculoskeletal:  Negative for arthralgias.  Skin:  Negative for itching and rash.  Neurological:  Negative for extremity weakness.  Hematological:  Negative for adenopathy.  Psychiatric/Behavioral:  Negative for confusion.    MEDICAL HISTORY:  Past Medical History:  Diagnosis Date   Arthritis    knees   COVID-19 12/2020   Depression    Family history of breast cancer    Goiter    Malignant neoplasm of left female breast ([place]) 04/07/2021    SURGICAL HISTORY: Past Surgical History:  Procedure Laterality Date   BREAST BIOPSY Left 04/07/2021   [place]:u/s bx triple neg   BREAST BIOPSY Left 05/20/2021   [place] Axilla Bx, hydro 3 marker, path pending    KNEE ARTHROSCOPY     PORTACATH PLACEMENT Right 05/07/2021   Procedure: INSERTION PORT-A-CATH;  Surgeon: Robert Bellow, MD;  Location: [facility] ORS;  Service: General;  Laterality: Right;   TOOTH EXTRACTION     TRANSCERVICAL UTERINE FIBROID(S) ABLATION  2010    SOCIAL HISTORY: Social History   Socioeconomic History   Marital status: Married    Spouse name: Not on file   Number of children: Not on file   Years of education: Not on file   Highest education level: Not on file  Occupational History   Not on file  Tobacco Use   Smoking status: Never   Smokeless tobacco: Never  Vaping Use   Vaping Use: Never used  Substance and Sexual Activity   Alcohol use: Never   Drug use: Never   Sexual activity: Yes  Other Topics Concern   Not on file  Social History Narrative   ** Merged History Encounter **       Social Determinants of Health   Financial Resource Strain: Not on file  Food Insecurity: Not on file  Transportation Needs: Not on file  Physical Activity: Not on file  Stress: Not on file  Social Connections: Not on file   Intimate Partner Violence: Not on file    FAMILY HISTORY: Family History  Problem Relation Age of Onset   Breast cancer Paternal Grandmother    Breast cancer Sister        dx 95s, recurrence x3   Diabetes Sister    Diabetes Father    Throat cancer Maternal Grandmother     ALLERGIES:  has No Known Allergies.  MEDICATIONS:  Current Outpatient Medications  Medication Sig Dispense Refill   cetirizine (ZYRTEC) 10 MG tablet Take 10 mg by mouth at bedtime as needed for allergies.     cholecalciferol (VITAMIN D) 25 MCG (1000 UNIT) tablet Take 1,000 Units by mouth daily.     diclofenac Sodium (VOLTAREN) 1 % GEL Apply 1 application topically daily.     Flaxseed, Linseed, (FLAXSEED OIL PO) Take 2 tablets by mouth daily.     gabapentin (NEURONTIN) 600 MG tablet Take 600 mg by mouth every morning.     levonorgestrel (MIRENA) 20 MCG/DAY IUD 1 each by Intrauterine route once.     lidocaine-prilocaine (EMLA) cream Apply to affected area once 30 g 3   meloxicam (MOBIC) 15 MG tablet Take 15 mg by mouth daily.     Acetaminophen (TYLENOL 8 HOUR ARTHRITIS PAIN PO)  Take 1 tablet by mouth as needed. (Patient not taking: Reported on 06/08/2021)     dexamethasone (DECADRON) 4 MG tablet Take 2 tablets once a day for 3 days after carboplatin and AC chemotherapy. Take with food. (Patient not taking: Reported on 06/08/2021) 30 tablet 1   Glucosamine-Chondroitin (MOVE FREE PO) Take 1 tablet by mouth daily. (Patient not taking: Reported on 06/08/2021)     ondansetron (ZOFRAN) 8 MG tablet Take 1 tablet (8 mg total) by mouth 2 (two) times daily as needed. Start on the third day after carboplatin and AC chemotherapy. (Patient not taking: Reported on 06/08/2021) 30 tablet 1   prochlorperazine (COMPAZINE) 10 MG tablet Take 1 tablet (10 mg total) by mouth every 6 (six) hours as needed (Nausea or vomiting). (Patient not taking: Reported on 06/08/2021) 30 tablet 1   Specialty Vitamins Products (ONE-A-DAY ENERGY FORMULA PO)  Take 1 tablet by mouth daily. (Patient not taking: Reported on 06/08/2021)     TURMERIC PO Take 1 capsule by mouth daily. (Patient not taking: Reported on 06/08/2021)     No current facility-administered medications for this visit.   Facility-Administered Medications Ordered in Other Visits  Medication Dose Route Frequency Provider Last Rate Last Admin   heparin lock flush 100 unit/mL  500 Units Intravenous Once Earlie Server, MD         PHYSICAL EXAMINATION: ECOG PERFORMANCE STATUS: 0 - Asymptomatic Vitals:   06/08/21 0839  BP: 106/75  Pulse: 80  Resp: 18  Temp: 97.6 F (36.4 C)   Filed Weights   06/08/21 0839  Weight: 218 lb 14.4 oz (99.3 kg)    Physical Exam Constitutional:      General: She is not in acute distress. HENT:     Head: Normocephalic and atraumatic.  Eyes:     General: No scleral icterus. Cardiovascular:     Rate and Rhythm: Normal rate and regular rhythm.     Heart sounds: Normal heart sounds.  Pulmonary:     Effort: Pulmonary effort is normal. No respiratory distress.     Breath sounds: No wheezing.  Abdominal:     General: Bowel sounds are normal. There is no distension.     Palpations: Abdomen is soft.  Musculoskeletal:        General: No deformity. Normal range of motion.     Cervical back: Normal range of motion and neck supple.  Skin:    General: Skin is warm and dry.     Findings: No erythema or rash.  Neurological:     Mental Status: She is alert and oriented to person, place, and time. Mental status is at baseline.     Cranial Nerves: No cranial nerve deficit.     Coordination: Coordination normal.  Psychiatric:        Mood and Affect: Mood normal.   Breast exam was performed in seated and lying down position. Patient is status post left breast biopsy, there is a palpable mass upper outer quadrant, appears bigger [4-5 cm] comparing to last examination with focal swelling, s/p biopsy.  No palpable mass in right breast.  No palpable  lymphadenopathy bilaterally.  LABORATORY DATA:  I have reviewed the data as listed Lab Results  Component Value Date   WBC 3.5 (L) 06/08/2021   HGB 12.8 06/08/2021   HCT 40.1 06/08/2021   MCV 85.7 06/08/2021   PLT 228 06/08/2021   Recent Labs    04/29/21 1504 05/11/21 1247 06/08/21 0759  NA 139 137 136  K  4.8 4.5 4.3  CL 104 101 103  CO2 '30 28 25  ' GLUCOSE 96 96 80  BUN '18 14 20  ' CREATININE 0.69 0.66 0.64  CALCIUM 9.6 9.5 9.3  GFRNONAA >60 >60 >60  PROT 8.0 7.9 8.0  ALBUMIN 4.6 4.4 4.3  AST 34 68* 34  ALT 39 91* 51*  ALKPHOS 73 72 79  BILITOT 0.7 0.7 0.5    Iron/TIBC/Ferritin/ %Sat No results found for: IRON, TIBC, FERRITIN, IRONPCTSAT    RADIOGRAPHIC STUDIES: I have personally reviewed the radiological images as listed and agreed with the findings in the report. MR BREAST BILATERAL W WO CONTRAST INC CAD  Result Date: 05/18/2021 CLINICAL DATA:  55 year old female with new biopsy-proven UPPER OUTER LEFT breast malignancy. Staging and evaluation of extent. LABS:  None performed today EXAM: BILATERAL BREAST MRI WITH AND WITHOUT CONTRAST TECHNIQUE: Multiplanar, multisequence MR images of both breasts were obtained prior to and following the intravenous administration of 10 ml of Gadavist Three-dimensional MR images were rendered by post-processing of the original MR data on an independent workstation. The three-dimensional MR images were interpreted, and findings are reported in the following complete MRI report for this study. Three dimensional images were evaluated at the independent interpreting workstation using the DynaCAD thin client. COMPARISON:  05/12/2021 mammogram and ultrasound. Prior outside mammograms and ultrasounds. FINDINGS: Breast composition: b. Scattered fibroglandular tissue. Background parenchymal enhancement: Mild Right breast: No mass or suspicious enhancement. A Port-A-Cath is identified within the high UPPER INNER breast. Left breast: A 2.5 x 3 x 2.5 cm  (AP x transverse x CC) UPPER-OUTER LEFT breast bilobed structure with rim and adjacent enhancement contains biopsy clip artifact within the MEDIAL component and likely represents post biopsy hematoma. A 1.4 x 1 x 1.6 cm area of nodular non masslike enhancement located 2.5 cm posterior/superior to the biopsy clip artifact (series 11: Images 57-63). It is difficult to determine where the biopsy-proven malignancy is located and may lie within the region of the post biopsy hematoma or may be represented by the 1.4 x 1 x 1.6 cm nodular non masslike enhancement. Regardless, the nodular non masslike enhancement is worrisome for malignancy. The entire area including probable hematoma, biopsy clip artifact and nodular non masslike enhancements measures up to 4 cm in greatest diameter. No other suspicious abnormalities within the LEFT breast are identified. Lymph nodes: A single LEFT axillary lymph node with eccentric cortical thickening up to 0.5 cm is noted. No other abnormal lymph nodes are identified. Ancillary findings:  None. IMPRESSION: 1. 2.5 x 3 x 2.5 cm probable hematoma containing biopsy clip artifact within the UPPER OUTER LEFT breast in the region of biopsy-proven malignancy. 1.6 cm nodular non masslike enhancement 2.5 cm posterior/superior to the biopsy clip may represent the biopsy-proven malignancy or additional area of malignancy, as discussed above. When appropriate, surgical removal of the entire area is recommended, which measures up to 4 cm in greatest diameter. 2. Single abnormal appearing LEFT axillary lymph node with eccentric cortical thickening. Consider tissue sampling as indicated. 3. No MR evidence of RIGHT breast malignancy. RECOMMENDATION: Ultrasound-guided LEFT axillary lymph node biopsy as indicated. Otherwise treatment plan BI-RADS CATEGORY  6: Known biopsy-proven malignancy. Electronically Signed   By: Margarette Canada M.D.   On: 05/18/2021 13:25   US Breast Limited Uni Left Inc  Axilla  Result Date: 05/12/2021 CLINICAL DATA:  Recent diagnosis of left breast carcinoma at an outside institution. Questionable second mass noted on review of her imaging. Patient presents for tomosynthesis  imaging of the left breast as well as ultrasound to assess for left axillary lymphadenopathy. EXAM: DIGITAL DIAGNOSTIC UNILATERAL LEFT MAMMOGRAM WITH TOMOSYNTHESIS AND CAD; ULTRASOUND LEFT BREAST LIMITED TECHNIQUE: Left digital diagnostic mammography and breast tomosynthesis was performed. The images were evaluated with computer-aided detection.; Targeted ultrasound examination of the left breast was performed COMPARISON:  Outside exams from 03/07/2019, 03/18/2020 and 03/30/2021, as well as the procedure exam from 04/07/2021. ACR Breast Density Category b: There are scattered areas of fibroglandular density. FINDINGS: The biopsy clip lies within the anteromedial margin of the lateral left breast mass, nearly abutting the medial margin of a second oval mostly circumscribed mass that lies more laterally, with the 2 masses creating the appearance of either 2 contiguous masses or a bilobed mass. The more lateral mass appears new from the pre biopsy diagnostic mammogram. There are no other masses. There is architectural distortion adjacent to the biopsied/known left breast carcinoma. No other areas of architectural distortion. On physical exam, the biopsied mass in the upper outer left breast creates a contour bulge and is readily palpable. Targeted ultrasound is performed, showing the known left breast carcinoma at 2 o'clock, 4 cm the nipple, measuring 2.1 x 1.5 x 1.7 cm. Directly adjacent to this mass is a complex cystic mass that measures 1.4 x 1.2 x 1.5 cm, consistent with a small post biopsy hematoma/seroma. There are no other masses visible the upper outer or lateral left breast. Sonographic evaluation of the left axilla shows a lymph node with an eccentric cortical thickening, cortex measuring 5 mm. A  second node shows mild, but more diffuse lobular cortical prominence, maximum 2.9 mm. IMPRESSION: 1. 2.1 cm in biopsy proven malignancy in the lateral left breast at 2 o'clock, 4 cm the nipple. 2. Directly adjacent cystic mass, 1.5 cm in size, consistent with a post biopsy hematoma. 3. One abnormal and 1 borderline abnormal left axillary lymph node, the more clearly abnormal node having an eccentric cortical thickening of 5 mm. RECOMMENDATION: 1. Treatment as planned for the known left breast carcinoma. 2. Ultrasound-guided core needle biopsy of the abnormal left axillary lymph node should be considered prior to surgery. I have discussed the findings and recommendations with the patient. If applicable, a reminder letter will be sent to the patient regarding the next appointment. BI-RADS CATEGORY  6: Known biopsy-proven malignancy. Electronically Signed   By: Lajean Manes M.D.   On: 05/12/2021 15:18   MM DIAG BREAST TOMO UNI LEFT  Result Date: 05/12/2021 CLINICAL DATA:  Recent diagnosis of left breast carcinoma at an outside institution. Questionable second mass noted on review of her imaging. Patient presents for tomosynthesis imaging of the left breast as well as ultrasound to assess for left axillary lymphadenopathy. EXAM: DIGITAL DIAGNOSTIC UNILATERAL LEFT MAMMOGRAM WITH TOMOSYNTHESIS AND CAD; ULTRASOUND LEFT BREAST LIMITED TECHNIQUE: Left digital diagnostic mammography and breast tomosynthesis was performed. The images were evaluated with computer-aided detection.; Targeted ultrasound examination of the left breast was performed COMPARISON:  Outside exams from 03/07/2019, 03/18/2020 and 03/30/2021, as well as the procedure exam from 04/07/2021. ACR Breast Density Category b: There are scattered areas of fibroglandular density. FINDINGS: The biopsy clip lies within the anteromedial margin of the lateral left breast mass, nearly abutting the medial margin of a second oval mostly circumscribed mass that lies  more laterally, with the 2 masses creating the appearance of either 2 contiguous masses or a bilobed mass. The more lateral mass appears new from the pre biopsy diagnostic mammogram. There are no  other masses. There is architectural distortion adjacent to the biopsied/known left breast carcinoma. No other areas of architectural distortion. On physical exam, the biopsied mass in the upper outer left breast creates a contour bulge and is readily palpable. Targeted ultrasound is performed, showing the known left breast carcinoma at 2 o'clock, 4 cm the nipple, measuring 2.1 x 1.5 x 1.7 cm. Directly adjacent to this mass is a complex cystic mass that measures 1.4 x 1.2 x 1.5 cm, consistent with a small post biopsy hematoma/seroma. There are no other masses visible the upper outer or lateral left breast. Sonographic evaluation of the left axilla shows a lymph node with an eccentric cortical thickening, cortex measuring 5 mm. A second node shows mild, but more diffuse lobular cortical prominence, maximum 2.9 mm. IMPRESSION: 1. 2.1 cm in biopsy proven malignancy in the lateral left breast at 2 o'clock, 4 cm the nipple. 2. Directly adjacent cystic mass, 1.5 cm in size, consistent with a post biopsy hematoma. 3. One abnormal and 1 borderline abnormal left axillary lymph node, the more clearly abnormal node having an eccentric cortical thickening of 5 mm. RECOMMENDATION: 1. Treatment as planned for the known left breast carcinoma. 2. Ultrasound-guided core needle biopsy of the abnormal left axillary lymph node should be considered prior to surgery. I have discussed the findings and recommendations with the patient. If applicable, a reminder letter will be sent to the patient regarding the next appointment. BI-RADS CATEGORY  6: Known biopsy-proven malignancy. Electronically Signed   By: Lajean Manes M.D.   On: 05/12/2021 15:18   MM CLIP PLACEMENT LEFT  Result Date: 06/02/2021 CLINICAL DATA:  Status post MR guided core  biopsy of posterior extent of known LEFT breast malignancy. EXAM: DIAGNOSTIC LEFT MAMMOGRAM POST MRI BIOPSY COMPARISON:  Previous exam(s). FINDINGS: Mammographic images were obtained following MRI guided biopsy of biopsy of nodularity posterior to known malignancy in the UPPER-OUTER QUADRANT of the LEFT breast and placement of a barbell shaped clip. The biopsy marking clip is in expected position at the site of biopsy. The barbell and twist type clip placed at the original biopsy are 3.2 centimeters apart on the true LATERAL projection. IMPRESSION: Appropriate positioning of the barbell shaped biopsy marking clip at the site of biopsy in the UPPER-OUTER QUADRANT LEFT breast. Final Assessment: Post Procedure Mammograms for Marker Placement Electronically Signed   By: Nolon Nations M.D.   On: 06/02/2021 11:02  MM CLIP PLACEMENT LEFT  Result Date: 05/20/2021 CLINICAL DATA:  Status post ultrasound-guided core needle biopsy of a left axillary lymph node with focal, eccentric cortical thickening suspicious for a metastatic node. Recently diagnosed left breast cancer. EXAM: DIAGNOSTIC LEFT MAMMOGRAM POST ULTRASOUND BIOPSY COMPARISON:  Previous exam(s). FINDINGS: Mammographic images were obtained following ultrasound guided biopsy of the recently demonstrated abnormal appearing left axillary lymph node suspicious for a metastatic node. The biopsy marking clip could not be included on the images due to its far posterior location in the axilla. The spiral shaped HydroMARK biopsy marker clip was well visualized within the biopsied cortex of the lymph node at ultrasound. A previously placed linear biopsy marker clip is noted within and the previously biopsied mass/subsequent hematoma in upper outer left breast. IMPRESSION: Appropriate positioning of the spiral HydroMARK shaped biopsy marking clip at the site of biopsy in the biopsied left axillary lymph node at ultrasound. This could not be included on the mammogram  images. Final Assessment: Post Procedure Mammograms for Marker Placement Electronically Signed   By: Remo Lipps  Joneen Caraway M.D.   On: 05/20/2021 10:43   [place] LT BREAST BX W LOC DEV 1ST LESION IMG BX SPEC US GUIDE  Addendum Date: 05/31/2021   ADDENDUM REPORT: 05/31/2021 15:24 ADDENDUM: Update/correction to the addendum: Upon further review of the images, the lymph node in the diagnostic exam and the biopsied lymph node do appear to be the same, and therefore the patient should proceed with MRI guided biopsy of the posterior non mass enhancement in the left breast to determine extent of disease. The ultrasound biopsy for this Wednesday has been canceled. I have informed Dr. Tasia Catchings of the update and she is working on scheduling the patient for a MRI guided biopsy of the left breast. Electronically Signed   By: Ammie Ferrier M.D.   On: 05/31/2021 15:24   Addendum Date: 05/31/2021   ADDENDUM REPORT: 05/31/2021 14:45 ADDENDUM: Dr. Tasia Catchings called today to review the patient's imaging. The patient has a triple negative breast cancer, and her biopsied lymph node is negative despite the abnormal appearance on ultrasound. She stated that the patient would need additional chemotherapy treatment if the cancer is over 2 cm (which it appears to be on MRI), and/or if she has a positive lymph node. After reviewing the imaging, it appears that the lymph node that was biopsied (although it did appear abnormal) may not be the same abnormal lymph node that was scanned at the time of our diagnostic workup. The quickest solution would be to repeat an ultrasound guided biopsy of the most abnormal-appearing lymph node in the left axilla. If the second biopsied left axillary lymph node is also negative, then MRI guided biopsy of the posterior aspect of the nodular enhancement, which extends posterior to the dominant mass could be performed. This nodular enhancement together with the biopsy-proven cancer measures at least 4.4 cm in an oblique  projection on the MRI. The potential ultrasound-guided biopsy has been scheduled for 06/02/21 at 1pm. Electronically Signed   By: Ammie Ferrier M.D.   On: 05/31/2021 14:45   Addendum Date: 05/27/2021   ADDENDUM REPORT: 05/27/2021 11:01 ADDENDUM: PATHOLOGY revealed: A. LYMPH NODE, LEFT AXILLARY; ULTRASOUND-GUIDED BIOPSY: - NEGATIVE; NO EVIDENCE OF MALIGNANCY. - FRAGMENT OF BENIGN LYMPH NODE TISSUE. Comment: Immunohistochemical stain for pancytokeratin is negative. Stains for kappa and lambda demonstrate polytypic plasma cells. Pathology results are CONCORDANT with imaging findings, per Dr. Claudie Revering. Pathology results and recommendations were discussed with patient via telephone on 05/26/2021. Patient reported doing well after the biopsy with no adverse symptoms, and only slight tenderness at the site. Post biopsy care instructions were reviewed, questions were answered and my direct phone number was provided. Patient was instructed to call [facility] for any additional questions or concerns related to biopsy site. Recommendation: Patient instructed to continue current treatment plan for recently diagnosed LEFT breast cancer. Pathology results reported by Electa Sniff RN on 05/27/2021. Electronically Signed   By: Claudie Revering M.D.   On: 05/27/2021 11:01   Result Date: 05/31/2021 CLINICAL DATA:  Left axillary lymph node with focal, eccentric cortical thickening suspicious for a metastatic lymph node at recent ultrasound and MRI. Recently diagnosed left breast cancer. EXAM: ULTRASOUND GUIDED LEFT AXILLARY CORE NEEDLE BIOPSY COMPARISON:  Previous exam(s). PROCEDURE: I met with the patient and we discussed the procedure of ultrasound-guided biopsy, including benefits and alternatives. We discussed the high likelihood of a successful procedure. We discussed the risks of the procedure, including infection, bleeding, tissue injury, clip migration, and inadequate sampling. Informed written consent was  given.  The usual time-out protocol was performed immediately prior to the procedure. Using sterile technique and 1% Lidocaine as local anesthetic, under direct ultrasound visualization, a 14 gauge spring-loaded device was used to perform biopsy of the recently demonstrated left axillary lymph node with focal, eccentric cortical thickening using a caudal approach. At the conclusion of the procedure a spiral shaped HydroMARK tissue marker clip was deployed into the biopsy cavity. This was well seen within the biopsied lymph node at ultrasound. Follow up 2 view mammogram was performed and dictated separately. IMPRESSION: Ultrasound guided biopsy of the recently demonstrated left axillary lymph node with focal, eccentric cortical thickening. No apparent complications. Electronically Signed: By: Claudie Revering M.D. On: 05/20/2021 10:05   MR LT BREAST BX W LOC DEV 1ST LESION IMAGE BX SPEC MR GUIDE  Addendum Date: 06/04/2021   ADDENDUM REPORT: 06/04/2021 11:12 ADDENDUM: Pathology revealed DUCTAL CARCINOMA IN SITU, HIGH-GRADE of the LEFT breast, upper outer quadrant, posterior aspect, (barbell clip). This was found to be concordant by Dr. Nolon Nations. Pathology results were discussed with the patient by telephone. The patient reported doing well after the biopsy with tenderness at the site. Post biopsy instructions and care were reviewed and questions were answered. The patient was encouraged to call [facility] for any additional concerns. My direct phone number was provided. The patient has a recent diagnosis of LEFT breast cancer and should follow her outlined treatment plan. Pathology results reported by Terie Purser, RN on 06/04/2021. Electronically Signed   By: Nolon Nations M.D.   On: 06/04/2021 11:12   Result Date: 06/04/2021 CLINICAL DATA:  Patient presents for MR guided core biopsy of the posterior aspect of known LEFT breast malignancy. EXAM: MRI GUIDED CORE NEEDLE BIOPSY OF THE  LEFT BREAST TECHNIQUE: Multiplanar, multisequence MR imaging of the LEFT breast was performed both before and after administration of intravenous contrast. CONTRAST:  10 ml of Gadavist COMPARISON:  Previous exams. FINDINGS: I met with the patient, and we discussed the procedure of MRI guided biopsy, including risks, benefits, and alternatives. Specifically, we discussed the risks of infection, bleeding, tissue injury, clip migration, and inadequate sampling. Informed, written consent was given. The usual time out protocol was performed immediately prior to the procedure. Using sterile technique, 1% Lidocaine, MRI guidance, and a 9 gauge vacuum assisted device, biopsy was performed of enhancing nodules posterior to the known malignancy in the UPPER-OUTER QUADRANT of the LEFT breast using a LATERAL to MEDIAL approach. At the conclusion of the procedure, a barbell shaped tissue marker clip was deployed into the biopsy cavity. Follow-up 2-view mammogram was performed and dictated separately. IMPRESSION: MRI guided biopsy of LEFT breast .  No apparent complications. Electronically Signed: By: Nolon Nations M.D. On: 06/02/2021 11:00  ECHOCARDIOGRAM LIMITED  Result Date: 05/14/2021    ECHOCARDIOGRAM LIMITED REPORT   Patient Name:   Peggy Bates Date of Exam: 05/14/2021 Medical Rec #:  [id number]        Height:       67.0 in Accession #:    [id number]       Weight:       205.0 lb Date of Birth:  07-Aug-1966        BSA:          2.044 m Patient Age:    68 years         BP:           110/71 mmHg Patient Gender: F  HR:           68 bpm. Exam Location:  [facility] Procedure: 2D Echo, Cardiac Doppler, Color Doppler and Strain Analysis Indications:     Chemo Z09  History:         Patient has no prior history of Echocardiogram examinations.                  Breast cancer.  Sonographer:     Sherrie Sport RDCS (AE) Referring Phys:  [id number] Javonda Suh Diagnosing Phys: Ida Rogue MD  Sonographer Comments: Global  longitudinal strain was attempted. IMPRESSIONS  1. Left ventricular ejection fraction, by estimation, is 60 to 65%. The left ventricle has normal function. The left ventricle has no regional wall motion abnormalities. The average left ventricular global longitudinal strain is -17.1 %. The global longitudinal strain is normal.  2. Right ventricular systolic function is normal. The right ventricular size is normal. There is normal pulmonary artery systolic pressure. The estimated right ventricular systolic pressure is 79.0 mmHg. FINDINGS  Left Ventricle: Left ventricular ejection fraction, by estimation, is 60 to 65%. The left ventricle has normal function. The left ventricle has no regional wall motion abnormalities. The average left ventricular global longitudinal strain is -17.1 %. The global longitudinal strain is normal. The left ventricular internal cavity size was normal in size. There is no left ventricular hypertrophy. Right Ventricle: The right ventricular size is normal. No increase in right ventricular wall thickness. Right ventricular systolic function is normal. There is normal pulmonary artery systolic pressure. The tricuspid regurgitant velocity is 2.35 m/s, and  with an assumed right atrial pressure of 5 mmHg, the estimated right ventricular systolic pressure is 24.0 mmHg. Left Atrium: Left atrial size was normal in size. Right Atrium: Right atrial size was normal in size. Pericardium: There is no evidence of pericardial effusion. Mitral Valve: The mitral valve is normal in structure. No evidence of mitral valve stenosis. Tricuspid Valve: The tricuspid valve is normal in structure. Tricuspid valve regurgitation is mild . No evidence of tricuspid stenosis. Aortic Valve: The aortic valve is normal in structure. Aortic valve regurgitation is not visualized. No aortic stenosis is present. Aortic valve mean gradient measures 3.0 mmHg. Aortic valve peak gradient measures 5.7 mmHg. Aortic valve area, by VTI  measures 2.74 cm. Pulmonic Valve: The pulmonic valve was normal in structure. Pulmonic valve regurgitation is not visualized. No evidence of pulmonic stenosis. Aorta: The aortic root is normal in size and structure. Venous: The inferior vena cava is normal in size with greater than 50% respiratory variability, suggesting right atrial pressure of 3 mmHg. IAS/Shunts: No atrial level shunt detected by color flow Doppler. LEFT VENTRICLE PLAX 2D LVIDd:         3.76 cm  Diastology LVIDs:         2.68 cm  LV e' medial:    6.96 cm/s LV PW:         1.25 cm  LV E/e' medial:  13.2 LV IVS:        0.96 cm  LV e' lateral:   14.00 cm/s LVOT diam:     2.00 cm  LV E/e' lateral: 6.6 LV SV:         72 LV SV Index:   35       2D Longitudinal Strain LVOT Area:     3.14 cm 2D Strain GLS Avg:     -17.1 %  RIGHT VENTRICLE RV Basal diam:  2.49 cm RV S prime:  12.60 cm/s TAPSE (M-mode): 3.7 cm LEFT ATRIUM             Index       RIGHT ATRIUM           Index LA diam:        2.80 cm 1.37 cm/m  RA Area:     14.90 cm LA Vol (A2C):   63.3 ml 30.98 ml/m RA Volume:   36.50 ml  17.86 ml/m LA Vol (A4C):   35.2 ml 17.22 ml/m LA Biplane Vol: 48.2 ml 23.59 ml/m  AORTIC VALVE                   PULMONIC VALVE AV Area (Vmax):    2.82 cm    PV Vmax:        0.48 m/s AV Area (Vmean):   2.64 cm    PV Peak grad:   0.9 mmHg AV Area (VTI):     2.74 cm    RVOT Peak grad: 2 mmHg AV Vmax:           119.00 cm/s AV Vmean:          86.200 cm/s AV VTI:            0.261 m AV Peak Grad:      5.7 mmHg AV Mean Grad:      3.0 mmHg LVOT Vmax:         107.00 cm/s LVOT Vmean:        72.500 cm/s LVOT VTI:          0.228 m LVOT/AV VTI ratio: 0.87  AORTA Ao Root diam: 3.15 cm MITRAL VALVE               TRICUSPID VALVE MV Area (PHT): 5.20 cm    TR Peak grad:   22.1 mmHg MV Decel Time: 146 msec    TR Vmax:        235.00 cm/s MV E velocity: 92.10 cm/s MV A velocity: 81.40 cm/s  SHUNTS MV E/A ratio:  1.13        Systemic VTI:  0.23 m                            Systemic  Diam: 2.00 cm Ida Rogue MD Electronically signed by Ida Rogue MD Signature Date/Time: 05/14/2021/7:12:02 PM    Final       ASSESSMENT & PLAN:  1. Carcinoma of upper-outer quadrant of left breast in female, estrogen receptor negative ([place])   2. Family history of breast cancer   3. Encounter for antineoplastic chemotherapy   4. Transaminitis   .Cancer Staging Carcinoma of upper-outer quadrant of left breast in female, estrogen receptor negative ([place]) Staging form: Breast, AJCC 8th Edition - Clinical stage from 04/29/2021: Stage IIB (cT2, cN0, cM0, G3, ER-, PR-, HER2-) - Signed by Earlie Server, MD on 06/04/2021   #Left breast invasive mammary carcinoma, triple negative, + high-grade DCIS ER positive cT2 N0, stage IIB I recommend neoadjuvant chemotherapy with pembrolizumab/carboplatin every 3 weeks/ weekly Taxol x 4 cycles followed by ddAC x 4 Rationale and potential side effects of chemotherapy and immunotherapy were discussed in details with patient. We had discussed the composition of chemotherapy regimen, length of chemo cycle, duration of treatment and the time to assess response to treatment. I explained to the patient the risks and benefits of chemotherapy including all but not limited to hair loss, mouth sore, nausea, vomiting,  diarrhea, low blood counts, bleeding, neuropathy and risk of life threatening infection and even death, heart failure, secondary malignancy etc.I discussed the mechanism of action and rationale of using immunotherapy.   Discussed the potential side effects of immunotherapy including but not limited to diarrhea; skin rash; respiratory failure, kidney failure, mental status change, elevated LFTs/liver failure,endocrine abnormalities, acute deterioration  and even death,etc. patient voices understanding and agrees with proceeding with treatment. Patient has had a chemotherapy education.  Today we went over the antiemetics instructions in details. Labs are reviewed and  discussed with patient Proceed with cycle 1 day 1 pembrolizumab/carboplatin/Taxol  #Transaminitis, probably due to recent use of Tylenol/alcohol.  Advised patient to avoid alcohol use during chemotherapy. Today's repeating testing showed improvement of transaminitis.   Supportive care measures are necessary for patient well-being and will be provided as necessary. We spent sufficient time to discuss many aspect of care, questions were answered to patient's satisfaction.   All questions were answered. The patient knows to call the clinic with any problems questions or concerns.  cc Renee Rival, NP    Return of visit: 1 week lab MD Taxol/IV fluid Thank you for this kind referral and the opportunity to participate in the care of this patient. A copy of today's note is routed to referring provider    Earlie Server, MD, [name] Hematology Oncology [facility] at [facility] Pager- [phone] 06/08/2021

## 2021-06-08 NOTE — Progress Notes (Signed)
Patient denies new problems/concerns today.   °

## 2021-06-09 ENCOUNTER — Telehealth: Payer: Self-pay

## 2021-06-09 LAB — T4: T4, Total: 7.3 ug/dL (ref 4.5–12.0)

## 2021-06-09 NOTE — Telephone Encounter (Signed)
Telephone call to patient for follow up after receiving first infusion.   Patient states infusion went great.  States eating good and drinking plenty of fluids.   Denies any nausea or vomiting.  Encouraged patient to call for any concerns or questions. 

## 2021-06-16 ENCOUNTER — Inpatient Hospital Stay (HOSPITAL_BASED_OUTPATIENT_CLINIC_OR_DEPARTMENT_OTHER): Admitting: Oncology

## 2021-06-16 ENCOUNTER — Encounter: Payer: Self-pay | Admitting: Oncology

## 2021-06-16 ENCOUNTER — Inpatient Hospital Stay

## 2021-06-16 ENCOUNTER — Encounter: Payer: Self-pay | Admitting: *Deleted

## 2021-06-16 VITALS — BP 109/85 | HR 82 | Temp 97.4°F | Resp 18 | Wt 223.8 lb

## 2021-06-16 DIAGNOSIS — R7401 Elevation of levels of liver transaminase levels: Secondary | ICD-10-CM | POA: Diagnosis not present

## 2021-06-16 DIAGNOSIS — Z171 Estrogen receptor negative status [ER-]: Secondary | ICD-10-CM

## 2021-06-16 DIAGNOSIS — C50412 Malignant neoplasm of upper-outer quadrant of left female breast: Secondary | ICD-10-CM

## 2021-06-16 DIAGNOSIS — Z5112 Encounter for antineoplastic immunotherapy: Secondary | ICD-10-CM | POA: Diagnosis not present

## 2021-06-16 DIAGNOSIS — Z5111 Encounter for antineoplastic chemotherapy: Secondary | ICD-10-CM

## 2021-06-16 LAB — COMPREHENSIVE METABOLIC PANEL
ALT: 81 U/L — ABNORMAL HIGH (ref 0–44)
AST: 57 U/L — ABNORMAL HIGH (ref 15–41)
Albumin: 4.1 g/dL (ref 3.5–5.0)
Alkaline Phosphatase: 77 U/L (ref 38–126)
Anion gap: 6 (ref 5–15)
BUN: 26 mg/dL — ABNORMAL HIGH (ref 6–20)
CO2: 26 mmol/L (ref 22–32)
Calcium: 9.2 mg/dL (ref 8.9–10.3)
Chloride: 103 mmol/L (ref 98–111)
Creatinine, Ser: 0.73 mg/dL (ref 0.44–1.00)
GFR, Estimated: >60 mL/min (ref >60)
Glucose, Bld: 140 mg/dL — ABNORMAL HIGH (ref 70–99)
Potassium: 4.5 mmol/L (ref 3.5–5.1)
Sodium: 135 mmol/L (ref 135–145)
Total Bilirubin: 0.7 mg/dL (ref 0.3–1.2)
Total Protein: 7.2 g/dL (ref 6.5–8.1)

## 2021-06-16 LAB — CBC WITH DIFFERENTIAL/PLATELET
Abs Immature Granulocytes: 0.01 10*3/uL (ref 0.00–0.07)
Basophils Absolute: 0 10*3/uL (ref 0.0–0.1)
Basophils Relative: 0 %
Eosinophils Absolute: 0.1 10*3/uL (ref 0.0–0.5)
Eosinophils Relative: 3 %
HCT: 38.4 % (ref 36.0–46.0)
Hemoglobin: 12.2 g/dL (ref 12.0–15.0)
Immature Granulocytes: 0 %
Lymphocytes Relative: 40 %
Lymphs Abs: 1.5 10*3/uL (ref 0.7–4.0)
MCH: 27.3 pg (ref 26.0–34.0)
MCHC: 31.8 g/dL (ref 30.0–36.0)
MCV: 85.9 fL (ref 80.0–100.0)
Monocytes Absolute: 0.2 10*3/uL (ref 0.1–1.0)
Monocytes Relative: 6 %
Neutro Abs: 1.9 10*3/uL (ref 1.7–7.7)
Neutrophils Relative %: 51 %
Platelets: 220 10*3/uL (ref 150–400)
RBC: 4.47 MIL/uL (ref 3.87–5.11)
RDW: 12.6 % (ref 11.5–15.5)
Smear Review: NORMAL
WBC: 3.7 10*3/uL — ABNORMAL LOW (ref 4.0–10.5)
nRBC: 0 % (ref 0.0–0.2)

## 2021-06-16 MED ORDER — HEPARIN SOD (PORK) LOCK FLUSH 100 UNIT/ML IV SOLN
INTRAVENOUS | Status: AC
  Filled 2021-06-16: qty 5

## 2021-06-16 MED ORDER — SODIUM CHLORIDE 0.9% FLUSH
10.0000 mL | INTRAVENOUS | Status: DC | PRN
  Administered 2021-06-16: 10 mL via INTRAVENOUS
  Filled 2021-06-16: qty 10

## 2021-06-16 MED ORDER — SODIUM CHLORIDE 0.9 % IV SOLN
10.0000 mg | Freq: Once | INTRAVENOUS | Status: AC
  Administered 2021-06-16: 10 mg via INTRAVENOUS
  Filled 2021-06-16: qty 10

## 2021-06-16 MED ORDER — SODIUM CHLORIDE 0.9 % IV SOLN
Freq: Once | INTRAVENOUS | Status: AC
  Filled 2021-06-16: qty 250

## 2021-06-16 MED ORDER — HEPARIN SOD (PORK) LOCK FLUSH 100 UNIT/ML IV SOLN
500.0000 [IU] | Freq: Once | INTRAVENOUS | Status: DC | PRN
  Filled 2021-06-16: qty 5

## 2021-06-16 MED ORDER — FAMOTIDINE 20 MG IN NS 100 ML IVPB
20.0000 mg | Freq: Once | INTRAVENOUS | Status: AC
  Administered 2021-06-16: 20 mg via INTRAVENOUS
  Filled 2021-06-16: qty 20

## 2021-06-16 MED ORDER — DIPHENHYDRAMINE HCL 50 MG/ML IJ SOLN
50.0000 mg | Freq: Once | INTRAMUSCULAR | Status: AC
  Administered 2021-06-16: 50 mg via INTRAVENOUS
  Filled 2021-06-16: qty 1

## 2021-06-16 MED ORDER — HEPARIN SOD (PORK) LOCK FLUSH 100 UNIT/ML IV SOLN
500.0000 [IU] | Freq: Once | INTRAVENOUS | Status: AC
  Administered 2021-06-16: 500 [IU] via INTRAVENOUS
  Filled 2021-06-16: qty 5

## 2021-06-16 MED ORDER — SODIUM CHLORIDE 0.9 % IV SOLN
80.0000 mg/m2 | Freq: Once | INTRAVENOUS | Status: AC
  Administered 2021-06-16: 168 mg via INTRAVENOUS
  Filled 2021-06-16: qty 28

## 2021-06-16 NOTE — Progress Notes (Signed)
Per [name] RN per Dr. Tasia Catchings no IVFs needed at this time, okay to proceed with Taxol treatment with AST (57) and ALT (81).

## 2021-06-16 NOTE — Patient Instructions (Signed)
[facility] [place] [facility]  Discharge Instructions: Thank you for choosing [facility] to provide your oncology and hematology care.  If you have a lab appointment with the Cancer Center, please go directly to the Cancer Center and check in at the registration area.  Wear comfortable clothing and clothing appropriate for easy access to any Portacath or PICC line.   We strive to give you quality time with your provider. You may need to reschedule your appointment if you arrive late (15 or more minutes).  Arriving late affects you and other patients whose appointments are after yours.  Also, if you miss three or more appointments without notifying the office, you may be dismissed from the clinic at the provider's discretion.      For prescription refill requests, have your pharmacy contact our office and allow 72 hours for refills to be completed.    Today you received the following chemotherapy and/or immunotherapy agents Taxol        To help prevent nausea and vomiting after your treatment, we encourage you to take your nausea medication as directed.  BELOW ARE SYMPTOMS THAT SHOULD BE REPORTED IMMEDIATELY: *FEVER GREATER THAN 100.4 F (38 C) OR HIGHER *CHILLS OR SWEATING *NAUSEA AND VOMITING THAT IS NOT CONTROLLED WITH YOUR NAUSEA MEDICATION *UNUSUAL SHORTNESS OF BREATH *UNUSUAL BRUISING OR BLEEDING *URINARY PROBLEMS (pain or burning when urinating, or frequent urination) *BOWEL PROBLEMS (unusual diarrhea, constipation, pain near the anus) TENDERNESS IN MOUTH AND THROAT WITH OR WITHOUT PRESENCE OF ULCERS (sore throat, sores in mouth, or a toothache) UNUSUAL RASH, SWELLING OR PAIN  UNUSUAL VAGINAL DISCHARGE OR ITCHING   Items with * indicate a potential emergency and should be followed up as soon as possible or go to the Emergency Department if any problems should occur.  Please show the CHEMOTHERAPY ALERT CARD or IMMUNOTHERAPY ALERT CARD at check-in to  the Emergency Department and triage nurse.  Should you have questions after your visit or need to cancel or reschedule your appointment, please contact [facility] [place]  [phone] and follow the prompts.  Office hours are 8:00 a.m. to 4:30 p.m. Monday - Friday. Please note that voicemails left after 4:00 p.m. may not be returned until the following business day.  We are closed weekends and major holidays. You have access to a nurse at all times for urgent questions. Please call the main number to the clinic [phone] and follow the prompts.  For any non-urgent questions, you may also contact your provider using MyChart. We now offer e-Visits for anyone 18 and older to request care online for non-urgent symptoms. For details visit [email or web address].   Also download the MyChart app! Go to the app store, search "MyChart", open the app, select [facility], and log in with your MyChart username and password.  Due to Covid, a mask is required upon entering the hospital/clinic. If you do not have a mask, one will be given to you upon arrival. For doctor visits, patients may have 1 support person aged 18 or older with them. For treatment visits, patients cannot have anyone with them due to current Covid guidelines and our immunocompromised population.  

## 2021-06-16 NOTE — Progress Notes (Signed)
Patient here for follow up. Pt reports that she had eye pressure and minor headache after first tx.

## 2021-06-16 NOTE — Progress Notes (Signed)
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  female with PMH listed below was seen in consultation at the request of  Renee Rival, NP  for evaluation of triple negative breast cancer.  03/30/2021, screening mammogram with 3D 2 nodular areas of asymmetric density demonstrated within the superior lateral aspect of the left breast middle third depth.  Finding was best appreciated on 3D tomosynthesis imaging. Targeted ultrasound showed left upper outer breast 1:30 position 1.1 cm round hypoechoic mass with angular margins. 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 Bary Castilla yesterday.  She presents to establish care with me today Patient did not notice this left mass prior to the mammogram.   Case was discussed on Tumor board.  Pathology reports and mammogram/ultrasound were done at outside facility and results were reviewed and discussed with patient and her husband.  LN status was not mentioned on her [place]. Recommend additional images with mammogram and MRI, neoadjuvant chemotherapy  Family history of breast cancer: Breast cancer in her sister, her late 89s, early 55s; and being paternal grandmother Menarche: 44 Age at first live childbirth: 89  patient has 2 sons and 1 daughter.  Daughter has passed away Used OCP:  Used estrogen and progesterone therapy: Denies History of Radiation to the chest: Denies Previous of breast biopsy: Denies  /25/2022 diagnostic mammogram of left breast showed 2.1 cm biopsy-proven  malignancy in the lateral left breast at 2:00, 4 cm from nipple.  Directly adjacent cystic mass, 1.5 cm in size, consistent with biopsy hematoma.  1 abnormal and 1 borderline abnormal left axillary lymph node  05/18/2021 bilateral breast MRI with and without contrast showed 2.5 x 3 x 2.5 cm hematoma containing biopsy clip artifact within the upper outer left breast.  1.6 cm nodular non-mass-like enhancement 2.5 cm posterior/superior to the biopsy clip may represent biopsy-proven malignancy or additional areas of malignancy.  Single abnormal appearing left axillary lymph node with eccentric cortical thickening.  No MRI evidence of the right breast malignancy.  #05/07/2021, Mediport was placed by Dr. Bary Castilla #05/14/2021 echocardiogram showed LVEF 60-55%  05/20/2021 Slide consultation of her left breast biopsy from outside institution - invasive mammary carcinoma, no special type, grade 3, DCIS and LVI not identified. ER negative, PR negative, HER2 IHC negative. Ki 67 70-80%    05/20/2021 ultrasound-guided left axillary lymph node was negative for malignancy.  06/02/2021 MRI left breast biopsy of the upper outer quadrant - pathology shows high grade DCIS, ER 30%+  05/26/2021, genetic testing-[facility] negative  INTERVAL HISTORY Peggy Bates is a 55 y.o. female who has above history reviewed by me today presents for follow up visit for management of left triple negative breast cancer Problems and complaints are listed below: Patient received cycle 1 day 1 pembrolizumab/carboplatin/Taxol.  She reports feeling well.  No nausea vomiting or diarrhea.  No fever or chills.  Review of Systems  Constitutional:  Negative for appetite change, chills, fatigue and fever.  HENT:   Negative for hearing loss and voice change.   Eyes:  Negative for eye problems.  Respiratory:  Negative for  chest tightness and cough.   Cardiovascular:  Negative for chest pain.  Gastrointestinal:  Negative for abdominal distention,  abdominal pain and blood in stool.  Endocrine: Negative for hot flashes.  Genitourinary:  Negative for difficulty urinating and frequency.   Musculoskeletal:  Negative for arthralgias.  Skin:  Negative for itching and rash.  Neurological:  Negative for extremity weakness.  Hematological:  Negative for adenopathy.  Psychiatric/Behavioral:  Negative for confusion.    MEDICAL HISTORY:  Past Medical History:  Diagnosis Date   Arthritis    knees   COVID-19 12/2020   Depression    Family history of breast cancer    Goiter    Malignant neoplasm of left female breast ([place]) 04/07/2021    SURGICAL HISTORY: Past Surgical History:  Procedure Laterality Date   BREAST BIOPSY Left 04/07/2021   [facility] [place]:u/s bx triple neg   BREAST BIOPSY Left 05/20/2021   [place] Axilla Bx, hydro 3 marker, path pending    KNEE ARTHROSCOPY     PORTACATH PLACEMENT Right 05/07/2021   Procedure: INSERTION PORT-A-CATH;  Surgeon: Robert Bellow, MD;  Location: [facility] ORS;  Service: General;  Laterality: Right;   TOOTH EXTRACTION     TRANSCERVICAL UTERINE FIBROID(S) ABLATION  2010    SOCIAL HISTORY: Social History   Socioeconomic History   Marital status: Married    Spouse name: Not on file   Number of children: Not on file   Years of education: Not on file   Highest education level: Not on file  Occupational History   Not on file  Tobacco Use   Smoking status: Never   Smokeless tobacco: Never  Vaping Use   Vaping Use: Never used  Substance and Sexual Activity   Alcohol use: Never   Drug use: Never   Sexual activity: Yes  Other Topics Concern   Not on file  Social History Narrative   ** Merged History Encounter **       Social Determinants of Health   Financial Resource Strain: Not on file  Food Insecurity: Not on file  Transportation Needs: Not on file  Physical Activity: Not on file  Stress: Not on file  Social Connections: Not on file  Intimate Partner Violence:  Not on file    FAMILY HISTORY: Family History  Problem Relation Age of Onset   Breast cancer Paternal Grandmother    Breast cancer Sister        dx 64s, recurrence x3   Diabetes Sister    Diabetes Father    Throat cancer Maternal Grandmother     ALLERGIES:  has No Known Allergies.  MEDICATIONS:  Current Outpatient Medications  Medication Sig Dispense Refill   cholecalciferol (VITAMIN D) 25 MCG (1000 UNIT) tablet Take 1,000 Units by mouth daily.     dexamethasone (DECADRON) 4 MG tablet Take 2 tablets once a day for 3 days after carboplatin and AC chemotherapy. Take with food. 30 tablet 1   diclofenac Sodium (VOLTAREN) 1 % GEL Apply 1 application topically daily.     Flaxseed, Linseed, (FLAXSEED OIL PO) Take 2 tablets by mouth daily.     gabapentin (NEURONTIN) 600 MG tablet Take 600 mg by mouth every morning.     Glucosamine-Chondroitin (MOVE FREE PO) Take 1 tablet by mouth daily.     levonorgestrel (MIRENA) 20 MCG/DAY IUD 1 each by Intrauterine route once.     lidocaine-prilocaine (EMLA) cream Apply to affected area once 30 g 3   meloxicam (MOBIC) 15  MG tablet Take 15 mg by mouth daily.     ondansetron (ZOFRAN) 8 MG tablet Take 1 tablet (8 mg total) by mouth 2 (two) times daily as needed. Start on the third day after carboplatin and AC chemotherapy. (Patient not taking: No sig reported) 30 tablet 1   prochlorperazine (COMPAZINE) 10 MG tablet Take 1 tablet (10 mg total) by mouth every 6 (six) hours as needed (Nausea or vomiting). (Patient not taking: No sig reported) 30 tablet 1   No current facility-administered medications for this visit.     PHYSICAL EXAMINATION: ECOG PERFORMANCE STATUS: 0 - Asymptomatic Vitals:   06/16/21 0845 06/16/21 0907  BP: 98/62 109/85  Pulse: 75 82  Resp: 18   Temp: (!) 97.4 F (36.3 C)    Filed Weights   06/16/21 0845  Weight: 223 lb 12.8 oz (101.5 kg)    Physical Exam Constitutional:      General: She is not in acute distress. HENT:      Head: Normocephalic and atraumatic.  Eyes:     General: No scleral icterus. Cardiovascular:     Rate and Rhythm: Normal rate and regular rhythm.     Heart sounds: Normal heart sounds.  Pulmonary:     Effort: Pulmonary effort is normal. No respiratory distress.     Breath sounds: No wheezing.  Abdominal:     General: Bowel sounds are normal. There is no distension.     Palpations: Abdomen is soft.  Musculoskeletal:        General: No deformity. Normal range of motion.     Cervical back: Normal range of motion and neck supple.  Skin:    General: Skin is warm and dry.     Findings: No erythema or rash.  Neurological:     Mental Status: She is alert and oriented to person, place, and time. Mental status is at baseline.     Cranial Nerves: No cranial nerve deficit.     Coordination: Coordination normal.  Psychiatric:        Mood and Affect: Mood normal.   LABORATORY DATA:  I have reviewed the data as listed Lab Results  Component Value Date   WBC 3.7 (L) 06/16/2021   HGB 12.2 06/16/2021   HCT 38.4 06/16/2021   MCV 85.9 06/16/2021   PLT 220 06/16/2021   Recent Labs    05/11/21 1247 06/08/21 0759 06/16/21 0827  NA 137 136 135  K 4.5 4.3 4.5  CL 101 103 103  CO2 _0 GLUCOSE 96 80 140*  BUN 14 20 26*  CREATININE 0.66 0.64 0.73  CALCIUM 9.5 9.3 9.2  GFRNONAA >60 >60 >60  PROT 7.9 8.0 7.2  ALBUMIN 4.4 4.3 4.1  AST 68* 34 57*  ALT 91* 51* 81*  ALKPHOS 72 79 77  BILITOT 0.7 0.5 0.7    Iron/TIBC/Ferritin/ %Sat No results found for: IRON, TIBC, FERRITIN, IRONPCTSAT    RADIOGRAPHIC STUDIES: I have personally reviewed the radiological images as listed and agreed with the findings in the report. MR BREAST BILATERAL W WO CONTRAST INC CAD  Result Date: 05/18/2021 CLINICAL DATA:  55 year old female with new biopsy-proven UPPER OUTER LEFT breast malignancy. Staging and evaluation of extent. LABS:  None performed today EXAM: BILATERAL BREAST MRI WITH AND WITHOUT  CONTRAST TECHNIQUE: Multiplanar, multisequence MR images of both breasts were obtained prior to and following the intravenous administration of 10 ml of Gadavist Three-dimensional MR images were rendered by post-processing of the original MR data  on an independent workstation. The three-dimensional MR images were interpreted, and findings are reported in the following complete MRI report for this study. Three dimensional images were evaluated at the independent interpreting workstation using the DynaCAD thin client. COMPARISON:  05/12/2021 mammogram and ultrasound. Prior outside mammograms and ultrasounds. FINDINGS: Breast composition: b. Scattered fibroglandular tissue. Background parenchymal enhancement: Mild Right breast: No mass or suspicious enhancement. A Port-A-Cath is identified within the high UPPER INNER breast. Left breast: A 2.5 x 3 x 2.5 cm (AP x transverse x CC) UPPER-OUTER LEFT breast bilobed structure with rim and adjacent enhancement contains biopsy clip artifact within the MEDIAL component and likely represents post biopsy hematoma. A 1.4 x 1 x 1.6 cm area of nodular non masslike enhancement located 2.5 cm posterior/superior to the biopsy clip artifact (series 11: Images 57-63). It is difficult to determine where the biopsy-proven malignancy is located and may lie within the region of the post biopsy hematoma or may be represented by the 1.4 x 1 x 1.6 cm nodular non masslike enhancement. Regardless, the nodular non masslike enhancement is worrisome for malignancy. The entire area including probable hematoma, biopsy clip artifact and nodular non masslike enhancements measures up to 4 cm in greatest diameter. No other suspicious abnormalities within the LEFT breast are identified. Lymph nodes: A single LEFT axillary lymph node with eccentric cortical thickening up to 0.5 cm is noted. No other abnormal lymph nodes are identified. Ancillary findings:  None. IMPRESSION: 1. 2.5 x 3 x 2.5 cm probable  hematoma containing biopsy clip artifact within the UPPER OUTER LEFT breast in the region of biopsy-proven malignancy. 1.6 cm nodular non masslike enhancement 2.5 cm posterior/superior to the biopsy clip may represent the biopsy-proven malignancy or additional area of malignancy, as discussed above. When appropriate, surgical removal of the entire area is recommended, which measures up to 4 cm in greatest diameter. 2. Single abnormal appearing LEFT axillary lymph node with eccentric cortical thickening. Consider tissue sampling as indicated. 3. No MR evidence of RIGHT breast malignancy. RECOMMENDATION: Ultrasound-guided LEFT axillary lymph node biopsy as indicated. Otherwise treatment plan BI-RADS CATEGORY  6: Known biopsy-proven malignancy. Electronically Signed   By: Margarette Canada M.D.   On: 05/18/2021 13:25   MM CLIP PLACEMENT LEFT  Result Date: 06/02/2021 CLINICAL DATA:  Status post MR guided core biopsy of posterior extent of known LEFT breast malignancy. EXAM: DIAGNOSTIC LEFT MAMMOGRAM POST MRI BIOPSY COMPARISON:  Previous exam(s). FINDINGS: Mammographic images were obtained following MRI guided biopsy of biopsy of nodularity posterior to known malignancy in the UPPER-OUTER QUADRANT of the LEFT breast and placement of a barbell shaped clip. The biopsy marking clip is in expected position at the site of biopsy. The barbell and twist type clip placed at the original biopsy are 3.2 centimeters apart on the true LATERAL projection. IMPRESSION: Appropriate positioning of the barbell shaped biopsy marking clip at the site of biopsy in the UPPER-OUTER QUADRANT LEFT breast. Final Assessment: Post Procedure Mammograms for Marker Placement Electronically Signed   By: Nolon Nations M.D.   On: 06/02/2021 11:02  MM CLIP PLACEMENT LEFT  Result Date: 05/20/2021 CLINICAL DATA:  Status post ultrasound-guided core needle biopsy of a left axillary lymph node with focal, eccentric cortical thickening suspicious for a  metastatic node. Recently diagnosed left breast cancer. EXAM: DIAGNOSTIC LEFT MAMMOGRAM POST ULTRASOUND BIOPSY COMPARISON:  Previous exam(s). FINDINGS: Mammographic images were obtained following ultrasound guided biopsy of the recently demonstrated abnormal appearing left axillary lymph node suspicious for a metastatic  node. The biopsy marking clip could not be included on the images due to its far posterior location in the axilla. The spiral shaped HydroMARK biopsy marker clip was well visualized within the biopsied cortex of the lymph node at ultrasound. A previously placed linear biopsy marker clip is noted within and the previously biopsied mass/subsequent hematoma in upper outer left breast. IMPRESSION: Appropriate positioning of the spiral HydroMARK shaped biopsy marking clip at the site of biopsy in the biopsied left axillary lymph node at ultrasound. This could not be included on the mammogram images. Final Assessment: Post Procedure Mammograms for Marker Placement Electronically Signed   By: Claudie Revering M.D.   On: 05/20/2021 10:43   [place] LT BREAST BX W LOC DEV 1ST LESION IMG BX SPEC US GUIDE  Addendum Date: 05/31/2021   ADDENDUM REPORT: 05/31/2021 15:24 ADDENDUM: Update/correction to the addendum: Upon further review of the images, the lymph node in the diagnostic exam and the biopsied lymph node do appear to be the same, and therefore the patient should proceed with MRI guided biopsy of the posterior non mass enhancement in the left breast to determine extent of disease. The ultrasound biopsy for this Wednesday has been canceled. I have informed Dr. Tasia Catchings of the update and she is working on scheduling the patient for a MRI guided biopsy of the left breast. Electronically Signed   By: Ammie Ferrier M.D.   On: 05/31/2021 15:24   Addendum Date: 05/31/2021   ADDENDUM REPORT: 05/31/2021 14:45 ADDENDUM: Dr. Tasia Catchings called today to review the patient's imaging. The patient has a triple negative breast cancer,  and her biopsied lymph node is negative despite the abnormal appearance on ultrasound. She stated that the patient would need additional chemotherapy treatment if the cancer is over 2 cm (which it appears to be on MRI), and/or if she has a positive lymph node. After reviewing the imaging, it appears that the lymph node that was biopsied (although it did appear abnormal) may not be the same abnormal lymph node that was scanned at the time of our diagnostic workup. The quickest solution would be to repeat an ultrasound guided biopsy of the most abnormal-appearing lymph node in the left axilla. If the second biopsied left axillary lymph node is also negative, then MRI guided biopsy of the posterior aspect of the nodular enhancement, which extends posterior to the dominant mass could be performed. This nodular enhancement together with the biopsy-proven cancer measures at least 4.4 cm in an oblique projection on the MRI. The potential ultrasound-guided biopsy has been scheduled for 06/02/21 at 1pm. Electronically Signed   By: Ammie Ferrier M.D.   On: 05/31/2021 14:45   Addendum Date: 05/27/2021   ADDENDUM REPORT: 05/27/2021 11:01 ADDENDUM: PATHOLOGY revealed: A. LYMPH NODE, LEFT AXILLARY; ULTRASOUND-GUIDED BIOPSY: - NEGATIVE; NO EVIDENCE OF MALIGNANCY. - FRAGMENT OF BENIGN LYMPH NODE TISSUE. Comment: Immunohistochemical stain for pancytokeratin is negative. Stains for kappa and lambda demonstrate polytypic plasma cells. Pathology results are CONCORDANT with imaging findings, per Dr. Claudie Revering. Pathology results and recommendations were discussed with patient via telephone on 05/26/2021. Patient reported doing well after the biopsy with no adverse symptoms, and only slight tenderness at the site. Post biopsy care instructions were reviewed, questions were answered and my direct phone number was provided. Patient was instructed to call [facility] for any additional questions or concerns related to biopsy  site. Recommendation: Patient instructed to continue current treatment plan for recently diagnosed LEFT breast cancer. Pathology results reported by  Electa Sniff RN on 05/27/2021. Electronically Signed   By: Claudie Revering M.D.   On: 05/27/2021 11:01   Result Date: 05/31/2021 CLINICAL DATA:  Left axillary lymph node with focal, eccentric cortical thickening suspicious for a metastatic lymph node at recent ultrasound and MRI. Recently diagnosed left breast cancer. EXAM: ULTRASOUND GUIDED LEFT AXILLARY CORE NEEDLE BIOPSY COMPARISON:  Previous exam(s). PROCEDURE: I met with the patient and we discussed the procedure of ultrasound-guided biopsy, including benefits and alternatives. We discussed the high likelihood of a successful procedure. We discussed the risks of the procedure, including infection, bleeding, tissue injury, clip migration, and inadequate sampling. Informed written consent was given. The usual time-out protocol was performed immediately prior to the procedure. Using sterile technique and 1% Lidocaine as local anesthetic, under direct ultrasound visualization, a 14 gauge spring-loaded device was used to perform biopsy of the recently demonstrated left axillary lymph node with focal, eccentric cortical thickening using a caudal approach. At the conclusion of the procedure a spiral shaped HydroMARK tissue marker clip was deployed into the biopsy cavity. This was well seen within the biopsied lymph node at ultrasound. Follow up 2 view mammogram was performed and dictated separately. IMPRESSION: Ultrasound guided biopsy of the recently demonstrated left axillary lymph node with focal, eccentric cortical thickening. No apparent complications. Electronically Signed: By: Claudie Revering M.D. On: 05/20/2021 10:05  MR LT BREAST BX W LOC DEV 1ST LESION IMAGE BX SPEC MR GUIDE  Addendum Date: 06/04/2021   ADDENDUM REPORT: 06/04/2021 11:12 ADDENDUM: Pathology revealed DUCTAL CARCINOMA IN SITU, HIGH-GRADE of the LEFT  breast, upper outer quadrant, posterior aspect, (barbell clip). This was found to be concordant by Dr. Nolon Nations. Pathology results were discussed with the patient by telephone. The patient reported doing well after the biopsy with tenderness at the site. Post biopsy instructions and care were reviewed and questions were answered. The patient was encouraged to call [facility] for any additional concerns. My direct phone number was provided. The patient has a recent diagnosis of LEFT breast cancer and should follow her outlined treatment plan. Pathology results reported by Terie Purser, RN on 06/04/2021. Electronically Signed   By: Nolon Nations M.D.   On: 06/04/2021 11:12   Result Date: 06/04/2021 CLINICAL DATA:  Patient presents for MR guided core biopsy of the posterior aspect of known LEFT breast malignancy. EXAM: MRI GUIDED CORE NEEDLE BIOPSY OF THE LEFT BREAST TECHNIQUE: Multiplanar, multisequence MR imaging of the LEFT breast was performed both before and after administration of intravenous contrast. CONTRAST:  10 ml of Gadavist COMPARISON:  Previous exams. FINDINGS: I met with the patient, and we discussed the procedure of MRI guided biopsy, including risks, benefits, and alternatives. Specifically, we discussed the risks of infection, bleeding, tissue injury, clip migration, and inadequate sampling. Informed, written consent was given. The usual time out protocol was performed immediately prior to the procedure. Using sterile technique, 1% Lidocaine, MRI guidance, and a 9 gauge vacuum assisted device, biopsy was performed of enhancing nodules posterior to the known malignancy in the UPPER-OUTER QUADRANT of the LEFT breast using a LATERAL to MEDIAL approach. At the conclusion of the procedure, a barbell shaped tissue marker clip was deployed into the biopsy cavity. Follow-up 2-view mammogram was performed and dictated separately. IMPRESSION: MRI guided biopsy of LEFT  breast .  No apparent complications. Electronically Signed: By: Nolon Nations M.D. On: 06/02/2021 11:00     ASSESSMENT & PLAN:  1. Carcinoma of upper-outer quadrant  of left breast in female, estrogen receptor negative ([place])   2. Encounter for antineoplastic chemotherapy   3. Transaminitis   .Cancer Staging Carcinoma of upper-outer quadrant of left breast in female, estrogen receptor negative ([place]) Staging form: Breast, AJCC 8th Edition - Clinical stage from 04/29/2021: Stage IIB (cT2, cN0, cM0, G3, ER-, PR-, HER2-) - Signed by Earlie Server, MD on 06/04/2021   #Left breast invasive mammary carcinoma, triple negative, + high-grade DCIS ER positive cT2 N0, stage IIB Labs reviewed and discussed with patient.   Proceed with cycle 1 day 8 Taxol  #Transaminitis, probably due to chemotherapy.  Supportive care measures are necessary for patient well-being and will be provided as necessary. We spent sufficient time to discuss many aspect of care, questions were answered to patient's satisfaction.  All questions were answered. The patient knows to call the clinic with any problems questions or concerns.  cc Renee Rival, NP    Return of visit: 1 week lab MD day 15 Taxol/with day 16 and 17 G-CSF Thank you for this kind referral and the opportunity to participate in the care of this patient. A copy of today's note is routed to referring provider    Earlie Server, MD, [name] Hematology Oncology [facility] at [facility] Pager- [phone] 06/16/2021

## 2021-06-22 ENCOUNTER — Other Ambulatory Visit: Payer: Self-pay

## 2021-06-22 DIAGNOSIS — Z171 Estrogen receptor negative status [ER-]: Secondary | ICD-10-CM

## 2021-06-22 MED ORDER — PROCHLORPERAZINE MALEATE 10 MG PO TABS: 10.0000 mg | ORAL_TABLET | Freq: Four times a day (QID) | ORAL | 1 refills | Status: DC | PRN

## 2021-06-23 ENCOUNTER — Encounter: Payer: Self-pay | Admitting: Oncology

## 2021-06-23 ENCOUNTER — Other Ambulatory Visit: Payer: Self-pay | Admitting: Oncology

## 2021-06-23 ENCOUNTER — Inpatient Hospital Stay

## 2021-06-23 ENCOUNTER — Inpatient Hospital Stay (HOSPITAL_BASED_OUTPATIENT_CLINIC_OR_DEPARTMENT_OTHER): Admitting: Oncology

## 2021-06-23 ENCOUNTER — Inpatient Hospital Stay: Attending: Oncology

## 2021-06-23 VITALS — BP 114/67 | HR 85 | Temp 97.6°F | Resp 18 | Wt 226.8 lb

## 2021-06-23 VITALS — BP 101/71 | HR 64 | Temp 97.0°F | Resp 16

## 2021-06-23 DIAGNOSIS — R7401 Elevation of levels of liver transaminase levels: Secondary | ICD-10-CM

## 2021-06-23 DIAGNOSIS — Z95828 Presence of other vascular implants and grafts: Secondary | ICD-10-CM

## 2021-06-23 DIAGNOSIS — D6481 Anemia due to antineoplastic chemotherapy: Secondary | ICD-10-CM | POA: Insufficient documentation

## 2021-06-23 DIAGNOSIS — Z803 Family history of malignant neoplasm of breast: Secondary | ICD-10-CM | POA: Diagnosis not present

## 2021-06-23 DIAGNOSIS — Z171 Estrogen receptor negative status [ER-]: Secondary | ICD-10-CM

## 2021-06-23 DIAGNOSIS — Z5111 Encounter for antineoplastic chemotherapy: Secondary | ICD-10-CM

## 2021-06-23 DIAGNOSIS — C50412 Malignant neoplasm of upper-outer quadrant of left female breast: Secondary | ICD-10-CM

## 2021-06-23 DIAGNOSIS — D649 Anemia, unspecified: Secondary | ICD-10-CM

## 2021-06-23 DIAGNOSIS — Z5189 Encounter for other specified aftercare: Secondary | ICD-10-CM | POA: Diagnosis not present

## 2021-06-23 DIAGNOSIS — T451X5A Adverse effect of antineoplastic and immunosuppressive drugs, initial encounter: Secondary | ICD-10-CM | POA: Insufficient documentation

## 2021-06-23 DIAGNOSIS — D701 Agranulocytosis secondary to cancer chemotherapy: Secondary | ICD-10-CM | POA: Diagnosis not present

## 2021-06-23 DIAGNOSIS — Z801 Family history of malignant neoplasm of trachea, bronchus and lung: Secondary | ICD-10-CM | POA: Diagnosis not present

## 2021-06-23 DIAGNOSIS — Z5112 Encounter for antineoplastic immunotherapy: Secondary | ICD-10-CM | POA: Insufficient documentation

## 2021-06-23 LAB — COMPREHENSIVE METABOLIC PANEL
ALT: 64 U/L — ABNORMAL HIGH (ref 0–44)
AST: 36 U/L (ref 15–41)
Albumin: 3.9 g/dL (ref 3.5–5.0)
Alkaline Phosphatase: 77 U/L (ref 38–126)
Anion gap: 7 (ref 5–15)
BUN: 16 mg/dL (ref 6–20)
CO2: 27 mmol/L (ref 22–32)
Calcium: 8.9 mg/dL (ref 8.9–10.3)
Chloride: 104 mmol/L (ref 98–111)
Creatinine, Ser: 0.62 mg/dL (ref 0.44–1.00)
GFR, Estimated: >60 mL/min (ref >60)
Glucose, Bld: 92 mg/dL (ref 70–99)
Potassium: 4 mmol/L (ref 3.5–5.1)
Sodium: 138 mmol/L (ref 135–145)
Total Bilirubin: 0.5 mg/dL (ref 0.3–1.2)
Total Protein: 7.1 g/dL (ref 6.5–8.1)

## 2021-06-23 LAB — CBC WITH DIFFERENTIAL/PLATELET
Abs Immature Granulocytes: 0.01 10*3/uL (ref 0.00–0.07)
Basophils Absolute: 0 10*3/uL (ref 0.0–0.1)
Basophils Relative: 0 %
Eosinophils Absolute: 0 10*3/uL (ref 0.0–0.5)
Eosinophils Relative: 2 %
HCT: 20.7 % — ABNORMAL LOW (ref 36.0–46.0)
Hemoglobin: 6.4 g/dL — ABNORMAL LOW (ref 12.0–15.0)
Immature Granulocytes: 1 %
Lymphocytes Relative: 47 %
Lymphs Abs: 0.7 10*3/uL (ref 0.7–4.0)
MCH: 27.5 pg (ref 26.0–34.0)
MCHC: 30.9 g/dL (ref 30.0–36.0)
MCV: 88.8 fL (ref 80.0–100.0)
Monocytes Absolute: 0.1 10*3/uL (ref 0.1–1.0)
Monocytes Relative: 7 %
Neutro Abs: 0.6 10*3/uL — ABNORMAL LOW (ref 1.7–7.7)
Neutrophils Relative %: 43 %
Platelets: 120 10*3/uL — ABNORMAL LOW (ref 150–400)
RBC: 2.33 MIL/uL — ABNORMAL LOW (ref 3.87–5.11)
RDW: 13.2 % (ref 11.5–15.5)
Smear Review: NORMAL
WBC: 1.4 10*3/uL — CL (ref 4.0–10.5)
nRBC: 1.4 % — ABNORMAL HIGH (ref 0.0–0.2)

## 2021-06-23 LAB — ABO/RH: ABO/RH(D): O NEG

## 2021-06-23 LAB — PREPARE RBC (CROSSMATCH)

## 2021-06-23 LAB — TSH: TSH: 0.862 u[IU]/mL (ref 0.350–4.500)

## 2021-06-23 MED ORDER — ACETAMINOPHEN 325 MG PO TABS
650.0000 mg | ORAL_TABLET | Freq: Once | ORAL | Status: AC
Start: 2021-06-23 — End: 2021-06-23
  Administered 2021-06-23: 650 mg via ORAL
  Filled 2021-06-23: qty 2

## 2021-06-23 MED ORDER — FILGRASTIM-SNDZ 480 MCG/0.8ML IJ SOSY
480.0000 ug | PREFILLED_SYRINGE | Freq: Once | INTRAMUSCULAR | Status: AC
  Administered 2021-06-23: 480 ug via SUBCUTANEOUS
  Filled 2021-06-23: qty 0.8

## 2021-06-23 MED ORDER — HEPARIN SOD (PORK) LOCK FLUSH 100 UNIT/ML IV SOLN
500.0000 [IU] | Freq: Once | INTRAVENOUS | Status: DC
  Filled 2021-06-23: qty 5

## 2021-06-23 MED ORDER — SODIUM CHLORIDE 0.9% IV SOLUTION
250.0000 mL | Freq: Once | INTRAVENOUS | Status: AC
  Administered 2021-06-23: 250 mL via INTRAVENOUS
  Filled 2021-06-23: qty 250

## 2021-06-23 MED ORDER — SODIUM CHLORIDE 0.9% FLUSH
10.0000 mL | Freq: Once | INTRAVENOUS | Status: AC
  Administered 2021-06-23: 10 mL via INTRAVENOUS
  Filled 2021-06-23: qty 10

## 2021-06-23 MED ORDER — SODIUM CHLORIDE 0.9% FLUSH
10.0000 mL | INTRAVENOUS | Status: AC | PRN
  Administered 2021-06-23: 10 mL
  Filled 2021-06-23: qty 10

## 2021-06-23 MED ORDER — CHLORHEXIDINE GLUCONATE 0.12 % MT SOLN: 10.0000 mL | Freq: Two times a day (BID) | OROMUCOSAL | 2 refills | Status: DC

## 2021-06-23 MED ORDER — HEPARIN SOD (PORK) LOCK FLUSH 100 UNIT/ML IV SOLN
500.0000 [IU] | Freq: Every day | INTRAVENOUS | Status: AC | PRN
  Administered 2021-06-23: 500 [IU]
  Filled 2021-06-23: qty 5

## 2021-06-23 MED ORDER — HEPARIN SOD (PORK) LOCK FLUSH 100 UNIT/ML IV SOLN
INTRAVENOUS | Status: AC
  Filled 2021-06-23: qty 5

## 2021-06-23 MED ORDER — DIPHENHYDRAMINE HCL 25 MG PO CAPS
25.0000 mg | ORAL_CAPSULE | Freq: Once | ORAL | Status: AC
  Administered 2021-06-23: 25 mg via ORAL
  Filled 2021-06-23: qty 1

## 2021-06-23 NOTE — Progress Notes (Signed)
Here for oncology follow up. Only issue is dyspnea with exertion, not new issue.

## 2021-06-23 NOTE — Progress Notes (Signed)
No treatment today. Per Darci Current., RN patient is to receive Zarxio injection and 1 unit of PRBC instead.

## 2021-06-23 NOTE — Progress Notes (Signed)
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  female with PMH listed below was seen in consultation at the request of  Renee Rival, NP  for evaluation of triple negative breast cancer.  03/30/2021, screening mammogram with 3D 2 nodular areas of asymmetric density demonstrated within the superior lateral aspect of the left breast middle third depth.  Finding was best appreciated on 3D tomosynthesis imaging. Targeted ultrasound showed left upper outer breast 1:30 position 1.1 cm round hypoechoic mass with angular margins. 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 Bary Castilla yesterday.  She presents to establish care with me today Patient did not notice this left mass prior to the mammogram.   Case was discussed on Tumor board.  Pathology reports and mammogram/ultrasound were done at outside facility and results were reviewed and discussed with patient and her husband.  LN status was not mentioned on her [place]. Recommend additional images with mammogram and MRI, neoadjuvant chemotherapy  Family history of breast cancer: Breast cancer in her sister, her late 32s, early 56s; and being paternal grandmother Menarche: 39 Age at first live childbirth: 23  patient has 2 sons and 1 daughter.  Daughter has passed away Used OCP:  Used estrogen and progesterone therapy: Denies History of Radiation to the chest: Denies Previous of breast biopsy: Denies  /25/2022 diagnostic mammogram of left breast showed 2.1 cm biopsy-proven  malignancy in the lateral left breast at 2:00, 4 cm from nipple.  Directly adjacent cystic mass, 1.5 cm in size, consistent with biopsy hematoma.  1 abnormal and 1 borderline abnormal left axillary lymph node  05/18/2021 bilateral breast MRI with and without contrast showed 2.5 x 3 x 2.5 cm hematoma containing biopsy clip artifact within the upper outer left breast.  1.6 cm nodular non-mass-like enhancement 2.5 cm posterior/superior to the biopsy clip may represent biopsy-proven malignancy or additional areas of malignancy.  Single abnormal appearing left axillary lymph node with eccentric cortical thickening.  No MRI evidence of the right breast malignancy.  #05/07/2021, Mediport was placed by Dr. Bary Castilla #05/14/2021 echocardiogram showed LVEF 60-55%  05/20/2021 Slide consultation of her left breast biopsy from outside institution - invasive mammary carcinoma, no special type, grade 3, DCIS and LVI not identified. ER negative, PR negative, HER2 IHC negative. Ki 67 70-80%    05/20/2021 ultrasound-guided left axillary lymph node was negative for malignancy.  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 D15- not done due to cytopenia  INTERVAL HISTORY Peggy Bates is a 55 y.o. female who has above history reviewed by me today presents for follow up visit for management of left triple negative breast cancer Problems and complaints are listed below: Patient received cycle 1 day 1 pembrolizumab/carboplatin/Taxol, day 8 taxol.   She reports feeling well.  No nausea vomiting or diarrhea.  No fever or chills.  Review of Systems  Constitutional:  Negative for appetite change, chills, fatigue and fever.  HENT:   Negative for hearing loss and voice change.   Eyes:  Negative for eye problems.  Respiratory:  Negative for chest  tightness and cough.   Cardiovascular:  Negative for chest pain.  Gastrointestinal:  Negative for abdominal distention, abdominal pain and blood in stool.  Endocrine: Negative for hot flashes.  Genitourinary:  Negative for difficulty urinating and frequency.   Musculoskeletal:  Negative for arthralgias.  Skin:  Negative for itching and rash.  Neurological:  Negative for extremity weakness.  Hematological:  Negative for adenopathy.  Psychiatric/Behavioral:  Negative for confusion.    MEDICAL HISTORY:  Past Medical History:  Diagnosis Date   Arthritis    knees   COVID-19 12/2020   Depression    Family history of breast cancer    Goiter    Malignant neoplasm of left female breast ([place]) 04/07/2021    SURGICAL HISTORY: Past Surgical History:  Procedure Laterality Date   BREAST BIOPSY Left 04/07/2021   [place] [facility] [place]:u/s bx triple neg   BREAST BIOPSY Left 05/20/2021   [place] Axilla Bx, hydro 3 marker, path pending    KNEE ARTHROSCOPY     PORTACATH PLACEMENT Right 05/07/2021   Procedure: INSERTION PORT-A-CATH;  Surgeon: Robert Bellow, MD;  Location: [facility] ORS;  Service: General;  Laterality: Right;   TOOTH EXTRACTION     TRANSCERVICAL UTERINE FIBROID(S) ABLATION  2010    SOCIAL HISTORY: Social History   Socioeconomic History   Marital status: Married    Spouse name: Not on file   Number of children: Not on file   Years of education: Not on file   Highest education level: Not on file  Occupational History   Not on file  Tobacco Use   Smoking status: Never   Smokeless tobacco: Never  Vaping Use   Vaping Use: Never used  Substance and Sexual Activity   Alcohol use: Never   Drug use: Never   Sexual activity: Yes  Other Topics Concern   Not on file  Social History Narrative   ** Merged History Encounter **       Social Determinants of Health   Financial Resource Strain: Not on file  Food Insecurity: Not on file  Transportation Needs: Not on  file  Physical Activity: Not on file  Stress: Not on file  Social Connections: Not on file  Intimate Partner Violence: Not on file    FAMILY HISTORY: Family History  Problem Relation Age of Onset   Breast cancer Paternal Grandmother    Breast cancer Sister        dx 68s, recurrence x3   Diabetes Sister    Diabetes Father    Throat cancer Maternal Grandmother     ALLERGIES:  has No Known Allergies.  MEDICATIONS:  Current Outpatient Medications  Medication Sig Dispense Refill   chlorhexidine (PERIDEX) 0.12 % solution Use as directed 10 mLs in the mouth or throat 2 (two) times daily. 473 mL 2   cholecalciferol (VITAMIN D) 25 MCG (1000 UNIT) tablet Take 1,000 Units by mouth daily.     dexamethasone (DECADRON) 4 MG tablet Take 2 tablets once a day for 3 days after carboplatin and AC chemotherapy. Take with food. 30 tablet 1   diclofenac Sodium (VOLTAREN) 1 % GEL Apply 1 application topically daily.     Flaxseed, Linseed, (FLAXSEED OIL PO) Take 2 tablets by mouth daily.     gabapentin (NEURONTIN) 600 MG tablet Take 600 mg by mouth every morning.  Glucosamine-Chondroitin (MOVE FREE PO) Take 1 tablet by mouth daily.     levonorgestrel (MIRENA) 20 MCG/DAY IUD 1 each by Intrauterine route once.     lidocaine-prilocaine (EMLA) cream Apply to affected area once 30 g 3   meloxicam (MOBIC) 15 MG tablet Take 15 mg by mouth daily.     ondansetron (ZOFRAN) 8 MG tablet Take 1 tablet (8 mg total) by mouth 2 (two) times daily as needed. Start on the third day after carboplatin and AC chemotherapy. (Patient not taking: No sig reported) 30 tablet 1   prochlorperazine (COMPAZINE) 10 MG tablet Take 1 tablet (10 mg total) by mouth every 6 (six) hours as needed (Nausea or vomiting). (Patient not taking: Reported on 06/23/2021) 30 tablet 1   No current facility-administered medications for this visit.   Facility-Administered Medications Ordered in Other Visits  Medication Dose Route Frequency Provider  Last Rate Last Admin   0.9 %  sodium chloride infusion (Manually program via Guardrails IV Fluids)  250 mL Intravenous Once Earlie Server, MD       acetaminophen (TYLENOL) tablet 650 mg  650 mg Oral Once Earlie Server, MD       diphenhydrAMINE (BENADRYL) capsule 25 mg  25 mg Oral Once Earlie Server, MD       heparin lock flush 100 unit/mL  500 Units Intracatheter Daily PRN Earlie Server, MD       sodium chloride flush (NS) 0.9 % injection 10 mL  10 mL Intracatheter PRN Earlie Server, MD         PHYSICAL EXAMINATION: ECOG PERFORMANCE STATUS: 0 - Asymptomatic Vitals:   06/23/21 0843  BP: 114/67  Pulse: 85  Resp: 18  Temp: 97.6 F (36.4 C)  SpO2: 100%   Filed Weights   06/23/21 0843  Weight: 226 lb 12.8 oz (102.9 kg)    Physical Exam Constitutional:      General: She is not in acute distress. HENT:     Head: Normocephalic and atraumatic.  Eyes:     General: No scleral icterus. Cardiovascular:     Rate and Rhythm: Normal rate and regular rhythm.     Heart sounds: Normal heart sounds.  Pulmonary:     Effort: Pulmonary effort is normal. No respiratory distress.     Breath sounds: No wheezing.  Abdominal:     General: Bowel sounds are normal. There is no distension.     Palpations: Abdomen is soft.  Musculoskeletal:        General: No deformity. Normal range of motion.     Cervical back: Normal range of motion and neck supple.  Skin:    General: Skin is warm and dry.     Findings: No erythema or rash.  Neurological:     Mental Status: She is alert and oriented to person, place, and time. Mental status is at baseline.     Cranial Nerves: No cranial nerve deficit.     Coordination: Coordination normal.  Psychiatric:        Mood and Affect: Mood normal.   LABORATORY DATA:  I have reviewed the data as listed Lab Results  Component Value Date   WBC 1.4 (LL) 06/23/2021   HGB 6.4 (L) 06/23/2021   HCT 20.7 (L) 06/23/2021   MCV 88.8 06/23/2021   PLT 120 (L) 06/23/2021   Recent Labs     06/08/21 0759 06/16/21 0827 06/23/21 0800  NA 136 135 138  K 4.3 4.5 4.0  CL 103 103 104  CO2 25  26 27  GLUCOSE 80 140* 92  BUN 20 26* 16  CREATININE 0.64 0.73 0.62  CALCIUM 9.3 9.2 8.9  GFRNONAA >60 >60 >60  PROT 8.0 7.2 7.1  ALBUMIN 4.3 4.1 3.9  AST 34 57* 36  ALT 51* 81* 64*  ALKPHOS 79 77 77  BILITOT 0.5 0.7 0.5    Iron/TIBC/Ferritin/ %Sat No results found for: IRON, TIBC, FERRITIN, IRONPCTSAT    RADIOGRAPHIC STUDIES: I have personally reviewed the radiological images as listed and agreed with the findings in the report. MM CLIP PLACEMENT LEFT  Result Date: 06/02/2021 CLINICAL DATA:  Status post MR guided core biopsy of posterior extent of known LEFT breast malignancy. EXAM: DIAGNOSTIC LEFT MAMMOGRAM POST MRI BIOPSY COMPARISON:  Previous exam(s). FINDINGS: Mammographic images were obtained following MRI guided biopsy of biopsy of nodularity posterior to known malignancy in the UPPER-OUTER QUADRANT of the LEFT breast and placement of a barbell shaped clip. The biopsy marking clip is in expected position at the site of biopsy. The barbell and twist type clip placed at the original biopsy are 3.2 centimeters apart on the true LATERAL projection. IMPRESSION: Appropriate positioning of the barbell shaped biopsy marking clip at the site of biopsy in the UPPER-OUTER QUADRANT LEFT breast. Final Assessment: Post Procedure Mammograms for Marker Placement Electronically Signed   By: Nolon Nations M.D.   On: 06/02/2021 11:02  MR LT BREAST BX W LOC DEV 1ST LESION IMAGE BX SPEC MR GUIDE  Addendum Date: 06/04/2021   ADDENDUM REPORT: 06/04/2021 11:12 ADDENDUM: Pathology revealed DUCTAL CARCINOMA IN SITU, HIGH-GRADE of the LEFT breast, upper outer quadrant, posterior aspect, (barbell clip). This was found to be concordant by Dr. Nolon Nations. Pathology results were discussed with the patient by telephone. The patient reported doing well after the biopsy with tenderness at the site. Post  biopsy instructions and care were reviewed and questions were answered. The patient was encouraged to call [facility] for any additional concerns. My direct phone number was provided. The patient has a recent diagnosis of LEFT breast cancer and should follow her outlined treatment plan. Pathology results reported by Terie Purser, RN on 06/04/2021. Electronically Signed   By: Nolon Nations M.D.   On: 06/04/2021 11:12   Result Date: 06/04/2021 CLINICAL DATA:  Patient presents for MR guided core biopsy of the posterior aspect of known LEFT breast malignancy. EXAM: MRI GUIDED CORE NEEDLE BIOPSY OF THE LEFT BREAST TECHNIQUE: Multiplanar, multisequence MR imaging of the LEFT breast was performed both before and after administration of intravenous contrast. CONTRAST:  10 ml of Gadavist COMPARISON:  Previous exams. FINDINGS: I met with the patient, and we discussed the procedure of MRI guided biopsy, including risks, benefits, and alternatives. Specifically, we discussed the risks of infection, bleeding, tissue injury, clip migration, and inadequate sampling. Informed, written consent was given. The usual time out protocol was performed immediately prior to the procedure. Using sterile technique, 1% Lidocaine, MRI guidance, and a 9 gauge vacuum assisted device, biopsy was performed of enhancing nodules posterior to the known malignancy in the UPPER-OUTER QUADRANT of the LEFT breast using a LATERAL to MEDIAL approach. At the conclusion of the procedure, a barbell shaped tissue marker clip was deployed into the biopsy cavity. Follow-up 2-view mammogram was performed and dictated separately. IMPRESSION: MRI guided biopsy of LEFT breast .  No apparent complications. Electronically Signed: By: Nolon Nations M.D. On: 06/02/2021 11:00     ASSESSMENT & PLAN:  1. Carcinoma of upper-outer  quadrant of left breast in female, estrogen receptor negative ([place])   2. Encounter for antineoplastic  chemotherapy   3. Port-A-Cath in place   4. Transaminitis   .Cancer Staging Carcinoma of upper-outer quadrant of left breast in female, estrogen receptor negative ([place]) Staging form: Breast, AJCC 8th Edition - Clinical stage from 04/29/2021: Stage IIB (cT2, cN0, cM0, G3, ER-, PR-, HER2-) - Signed by Earlie Server, MD on 06/04/2021   #Left breast invasive mammary carcinoma, triple negative, + high-grade DCIS ER positive cT2 N0, stage IIB Labs are reviewed and discussed with patient. Hold treatment today   # Chemotherapy induced anemia, Hb <7 Proceed with 1 unit of irradiated PRBC  # Neutropenia, chemotherapy induced. ANC 06, afebrile.  Start Zaxio daily x 3 days.  Peridex solution swish and spit BID, rx sent.   #Transaminitis, improved.   Supportive care measures are necessary for patient well-being and will be provided as necessary. We spent sufficient time to discuss many aspect of care, questions were answered to patient's satisfaction.  All questions were answered. The patient knows to call the clinic with any problems questions or concerns.  cc Renee Rival, NP    Return of visit: 1 week, will switch to weekly carbo AUC 2 with Taxol and pembro Q3 weeks.     Earlie Server, MD, [name] Hematology Oncology [facility] Pager- [phone] 06/23/2021

## 2021-06-24 ENCOUNTER — Inpatient Hospital Stay

## 2021-06-24 ENCOUNTER — Telehealth: Payer: Self-pay

## 2021-06-24 ENCOUNTER — Other Ambulatory Visit: Payer: Self-pay

## 2021-06-24 DIAGNOSIS — C50412 Malignant neoplasm of upper-outer quadrant of left female breast: Secondary | ICD-10-CM

## 2021-06-24 DIAGNOSIS — Z5112 Encounter for antineoplastic immunotherapy: Secondary | ICD-10-CM | POA: Diagnosis not present

## 2021-06-24 LAB — TYPE AND SCREEN
ABO/RH(D): O NEG
Antibody Screen: NEGATIVE
Unit division: 0

## 2021-06-24 LAB — BPAM RBC
Blood Product Expiration Date: 202208092359
ISSUE DATE / TIME: 202207061216
Unit Type and Rh: 5100

## 2021-06-24 LAB — T4: T4, Total: 7.1 ug/dL (ref 4.5–12.0)

## 2021-06-24 MED ORDER — FILGRASTIM-SNDZ 480 MCG/0.8ML IJ SOSY
480.0000 ug | PREFILLED_SYRINGE | Freq: Once | INTRAMUSCULAR | Status: AC
  Administered 2021-06-24: 480 ug via SUBCUTANEOUS
  Filled 2021-06-24: qty 0.8

## 2021-06-24 NOTE — Telephone Encounter (Signed)
This is a [place] patient.

## 2021-06-24 NOTE — Telephone Encounter (Signed)
Please schedule patient as requested and notify pt of appt. Thanks

## 2021-06-24 NOTE — Telephone Encounter (Signed)
-----   Message from Earlie Server, MD sent at 06/23/2021 11:04 PM EDT ----- Peggy Bates,  Her plan will be lab md pembrolizumab, carboplatin AUC 2, and taxol on 7/13. Thanks.   Aleen Sells, I changed her plan to weekly carbo auc 2 instead of Q3 week AUC 5.

## 2021-06-25 ENCOUNTER — Inpatient Hospital Stay

## 2021-06-25 DIAGNOSIS — Z5112 Encounter for antineoplastic immunotherapy: Secondary | ICD-10-CM | POA: Diagnosis not present

## 2021-06-25 DIAGNOSIS — C50412 Malignant neoplasm of upper-outer quadrant of left female breast: Secondary | ICD-10-CM

## 2021-06-25 MED ORDER — FILGRASTIM-SNDZ 480 MCG/0.8ML IJ SOSY
480.0000 ug | PREFILLED_SYRINGE | Freq: Once | INTRAMUSCULAR | Status: AC
  Administered 2021-06-25: 480 ug via SUBCUTANEOUS
  Filled 2021-06-25: qty 0.8

## 2021-06-29 ENCOUNTER — Encounter: Payer: Self-pay | Admitting: Oncology

## 2021-06-30 ENCOUNTER — Inpatient Hospital Stay

## 2021-06-30 ENCOUNTER — Encounter: Payer: Self-pay | Admitting: Oncology

## 2021-06-30 ENCOUNTER — Inpatient Hospital Stay (HOSPITAL_BASED_OUTPATIENT_CLINIC_OR_DEPARTMENT_OTHER): Admitting: Oncology

## 2021-06-30 ENCOUNTER — Ambulatory Visit

## 2021-06-30 VITALS — BP 122/85 | HR 75 | Temp 96.8°F | Resp 18 | Wt 227.5 lb

## 2021-06-30 DIAGNOSIS — Z171 Estrogen receptor negative status [ER-]: Secondary | ICD-10-CM

## 2021-06-30 DIAGNOSIS — Z95828 Presence of other vascular implants and grafts: Secondary | ICD-10-CM | POA: Diagnosis not present

## 2021-06-30 DIAGNOSIS — C50412 Malignant neoplasm of upper-outer quadrant of left female breast: Secondary | ICD-10-CM | POA: Diagnosis not present

## 2021-06-30 DIAGNOSIS — D6481 Anemia due to antineoplastic chemotherapy: Secondary | ICD-10-CM

## 2021-06-30 DIAGNOSIS — T451X5A Adverse effect of antineoplastic and immunosuppressive drugs, initial encounter: Secondary | ICD-10-CM

## 2021-06-30 DIAGNOSIS — Z5111 Encounter for antineoplastic chemotherapy: Secondary | ICD-10-CM

## 2021-06-30 DIAGNOSIS — Z801 Family history of malignant neoplasm of trachea, bronchus and lung: Secondary | ICD-10-CM

## 2021-06-30 DIAGNOSIS — D701 Agranulocytosis secondary to cancer chemotherapy: Secondary | ICD-10-CM

## 2021-06-30 DIAGNOSIS — Z5112 Encounter for antineoplastic immunotherapy: Secondary | ICD-10-CM | POA: Diagnosis not present

## 2021-06-30 DIAGNOSIS — Z803 Family history of malignant neoplasm of breast: Secondary | ICD-10-CM

## 2021-06-30 DIAGNOSIS — R7401 Elevation of levels of liver transaminase levels: Secondary | ICD-10-CM | POA: Diagnosis not present

## 2021-06-30 LAB — CBC WITH DIFFERENTIAL/PLATELET
Abs Immature Granulocytes: 0.02 10*3/uL (ref 0.00–0.07)
Basophils Absolute: 0 10*3/uL (ref 0.0–0.1)
Basophils Relative: 0 %
Eosinophils Absolute: 0 10*3/uL (ref 0.0–0.5)
Eosinophils Relative: 1 %
HCT: 42 % (ref 36.0–46.0)
Hemoglobin: 13.6 g/dL (ref 12.0–15.0)
Immature Granulocytes: 1 %
Lymphocytes Relative: 38 %
Lymphs Abs: 1.3 10*3/uL (ref 0.7–4.0)
MCH: 27.8 pg (ref 26.0–34.0)
MCHC: 32.4 g/dL (ref 30.0–36.0)
MCV: 85.7 fL (ref 80.0–100.0)
Monocytes Absolute: 0.4 10*3/uL (ref 0.1–1.0)
Monocytes Relative: 12 %
Neutro Abs: 1.7 10*3/uL (ref 1.7–7.7)
Neutrophils Relative %: 48 %
Platelets: 159 10*3/uL (ref 150–400)
RBC: 4.9 MIL/uL (ref 3.87–5.11)
RDW: 13.5 % (ref 11.5–15.5)
Smear Review: NORMAL
WBC: 3.4 10*3/uL — ABNORMAL LOW (ref 4.0–10.5)
nRBC: 0 % (ref 0.0–0.2)

## 2021-06-30 LAB — COMPREHENSIVE METABOLIC PANEL
ALT: 76 U/L — ABNORMAL HIGH (ref 0–44)
AST: 43 U/L — ABNORMAL HIGH (ref 15–41)
Albumin: 4.3 g/dL (ref 3.5–5.0)
Alkaline Phosphatase: 91 U/L (ref 38–126)
Anion gap: 8 (ref 5–15)
BUN: 20 mg/dL (ref 6–20)
CO2: 25 mmol/L (ref 22–32)
Calcium: 9.2 mg/dL (ref 8.9–10.3)
Chloride: 104 mmol/L (ref 98–111)
Creatinine, Ser: 0.65 mg/dL (ref 0.44–1.00)
GFR, Estimated: >60 mL/min (ref >60)
Glucose, Bld: 92 mg/dL (ref 70–99)
Potassium: 4.2 mmol/L (ref 3.5–5.1)
Sodium: 137 mmol/L (ref 135–145)
Total Bilirubin: 0.6 mg/dL (ref 0.3–1.2)
Total Protein: 7.6 g/dL (ref 6.5–8.1)

## 2021-06-30 MED ORDER — PALONOSETRON HCL INJECTION 0.25 MG/5ML
0.2500 mg | Freq: Once | INTRAVENOUS | Status: AC
  Administered 2021-06-30: 0.25 mg via INTRAVENOUS
  Filled 2021-06-30: qty 5

## 2021-06-30 MED ORDER — SODIUM CHLORIDE 0.9 % IV SOLN
80.0000 mg/m2 | Freq: Once | INTRAVENOUS | Status: AC
  Administered 2021-06-30: 168 mg via INTRAVENOUS
  Filled 2021-06-30: qty 28

## 2021-06-30 MED ORDER — SODIUM CHLORIDE 0.9% FLUSH
10.0000 mL | Freq: Once | INTRAVENOUS | Status: AC
  Administered 2021-06-30: 10 mL via INTRAVENOUS
  Filled 2021-06-30: qty 10

## 2021-06-30 MED ORDER — DEXAMETHASONE SODIUM PHOSPHATE 100 MG/10ML IJ SOLN
10.0000 mg | Freq: Once | INTRAMUSCULAR | Status: AC
  Administered 2021-06-30: 10 mg via INTRAVENOUS
  Filled 2021-06-30: qty 10

## 2021-06-30 MED ORDER — DIPHENHYDRAMINE HCL 50 MG/ML IJ SOLN
50.0000 mg | Freq: Once | INTRAMUSCULAR | Status: AC
  Administered 2021-06-30: 50 mg via INTRAVENOUS
  Filled 2021-06-30: qty 1

## 2021-06-30 MED ORDER — HEPARIN SOD (PORK) LOCK FLUSH 100 UNIT/ML IV SOLN
INTRAVENOUS | Status: AC
  Filled 2021-06-30: qty 5

## 2021-06-30 MED ORDER — FOSAPREPITANT DIMEGLUMINE INJECTION 150 MG
150.0000 mg | Freq: Once | INTRAVENOUS | Status: AC
  Administered 2021-06-30: 150 mg via INTRAVENOUS
  Filled 2021-06-30: qty 150

## 2021-06-30 MED ORDER — SODIUM CHLORIDE 0.9 % IV SOLN
300.0000 mg | Freq: Once | INTRAVENOUS | Status: AC
  Administered 2021-06-30: 300 mg via INTRAVENOUS
  Filled 2021-06-30: qty 30

## 2021-06-30 MED ORDER — HEPARIN SOD (PORK) LOCK FLUSH 100 UNIT/ML IV SOLN
500.0000 [IU] | Freq: Once | INTRAVENOUS | Status: AC
  Administered 2021-06-30: 500 [IU] via INTRAVENOUS
  Filled 2021-06-30: qty 5

## 2021-06-30 MED ORDER — FAMOTIDINE 20 MG IN NS 100 ML IVPB
20.0000 mg | Freq: Once | INTRAVENOUS | Status: AC
  Administered 2021-06-30: 20 mg via INTRAVENOUS
  Filled 2021-06-30: qty 20
  Filled 2021-06-30: qty 100

## 2021-06-30 MED ORDER — SODIUM CHLORIDE 0.9 % IV SOLN
200.0000 mg | Freq: Once | INTRAVENOUS | Status: AC
  Administered 2021-06-30: 200 mg via INTRAVENOUS
  Filled 2021-06-30: qty 8

## 2021-06-30 MED ORDER — SODIUM CHLORIDE 0.9 % IV SOLN
Freq: Once | INTRAVENOUS | Status: AC
  Filled 2021-06-30: qty 250

## 2021-06-30 MED ORDER — HEPARIN SOD (PORK) LOCK FLUSH 100 UNIT/ML IV SOLN
500.0000 [IU] | Freq: Once | INTRAVENOUS | Status: DC | PRN
  Filled 2021-06-30: qty 5

## 2021-06-30 NOTE — Progress Notes (Signed)
Hematology/Oncology progress note [facility] Telephone:([phone] [phone]   Patient Care Team: Renee Rival, NP as PCP - General (Nurse Practitioner)  REFERRING PROVIDER: Renee Rival, NP  CHIEF COMPLAINTS/REASON FOR VISIT:  Follow-up for acute triple negative breast cancer  HISTORY OF PRESENTING ILLNESS:   Peggy Bates is a  55 y.o.  female with PMH listed below was seen in consultation at the request of  Renee Rival, NP  for evaluation of triple negative breast cancer.  03/30/2021, screening mammogram with 3D 2 nodular areas of asymmetric density demonstrated within the superior lateral aspect of the left breast middle third depth.  Finding was best appreciated on 3D tomosynthesis imaging. Targeted ultrasound showed left upper outer breast 1:30 position 1.1 cm round hypoechoic mass with angular margins. 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 Bary Castilla yesterday.  She presents to establish care with me today Patient did not notice this left mass prior to the mammogram.   Case was discussed on Tumor board.  Pathology reports and mammogram/ultrasound were done at outside facility and results were reviewed and discussed with patient and her husband.  LN status was not mentioned on her [place]. Recommend additional images with mammogram and MRI, neoadjuvant chemotherapy  Family history of breast cancer: Breast cancer in her sister, her late 87s, early 72s; and being paternal grandmother Menarche: 36 Age at first live childbirth: 80  patient has 2 sons and 1 daughter.  Daughter has passed away Used OCP:  Used estrogen and progesterone therapy: Denies History of Radiation to the chest: Denies Previous of breast biopsy: Denies  /25/2022 diagnostic mammogram of left breast showed 2.1 cm biopsy-proven  malignancy in the lateral left breast at 2:00, 4 cm from nipple.  Directly adjacent cystic mass, 1.5 cm in size, consistent with biopsy hematoma.  1 abnormal and 1 borderline abnormal left axillary lymph node  05/18/2021 bilateral breast MRI with and without contrast showed 2.5 x 3 x 2.5 cm hematoma containing biopsy clip artifact within the upper outer left breast.  1.6 cm nodular non-mass-like enhancement 2.5 cm posterior/superior to the biopsy clip may represent biopsy-proven malignancy or additional areas of malignancy.  Single abnormal appearing left axillary lymph node with eccentric cortical thickening.  No MRI evidence of the right breast malignancy.  #05/07/2021, Mediport was placed by Dr. Bary Castilla #05/14/2021 echocardiogram showed LVEF 60-55%  05/20/2021 Slide consultation of her left breast biopsy from outside institution - invasive mammary carcinoma, no special type, grade 3, DCIS and LVI not identified. ER negative, PR negative, HER2 IHC negative. Ki 67 70-80%    05/20/2021 ultrasound-guided left axillary lymph node was negative for malignancy.  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 D15- not done due to cytopenia  INTERVAL HISTORY Peggy Bates is a 55 y.o. female who has above history reviewed by me today presents for follow up visit for management of left triple negative breast cancer Problems and complaints are listed below: Patient received cycle 1  pembrolizumab/carboplatin/Taxol She developed cytopenia due to chemotherapy and required 1 unit of PRBC transfusion last week.  Today she feels better, she feels that left breast mass has decreased in size.   No nausea vomiting or  diarrhea.  No fever or chills. Feels that left arm is "swelling".   Review of Systems  Constitutional:  Negative for  appetite change, chills, fatigue and fever.  HENT:   Negative for hearing loss and voice change.   Eyes:  Negative for eye problems.  Respiratory:  Negative for chest tightness and cough.   Cardiovascular:  Negative for chest pain.  Gastrointestinal:  Negative for abdominal distention, abdominal pain and blood in stool.  Endocrine: Negative for hot flashes.  Genitourinary:  Negative for difficulty urinating and frequency.   Musculoskeletal:  Negative for arthralgias.  Skin:  Negative for itching and rash.  Neurological:  Negative for extremity weakness.  Hematological:  Negative for adenopathy.  Psychiatric/Behavioral:  Negative for confusion.    MEDICAL HISTORY:  Past Medical History:  Diagnosis Date   Arthritis    knees   COVID-19 12/2020   Depression    Family history of breast cancer    Goiter    Malignant neoplasm of left female breast ([place]) 04/07/2021    SURGICAL HISTORY: Past Surgical History:  Procedure Laterality Date   BREAST BIOPSY Left 04/07/2021   [place]:u/s bx triple neg   BREAST BIOPSY Left 05/20/2021   [place] Axilla Bx, hydro 3 marker, path pending    KNEE ARTHROSCOPY     PORTACATH PLACEMENT Right 05/07/2021   Procedure: INSERTION PORT-A-CATH;  Surgeon: Robert Bellow, MD;  Location: [facility] ORS;  Service: General;  Laterality: Right;   TOOTH EXTRACTION     TRANSCERVICAL UTERINE FIBROID(S) ABLATION  2010    SOCIAL HISTORY: Social History   Socioeconomic History   Marital status: Married    Spouse name: Not on file   Number of children: Not on file   Years of education: Not on file   Highest education level: Not on file  Occupational History   Not on file  Tobacco Use   Smoking status: Never   Smokeless tobacco: Never  Vaping Use   Vaping Use: Never used  Substance and Sexual Activity   Alcohol use: Never   Drug use: Never   Sexual activity: Yes  Other Topics Concern   Not on file  Social History Narrative   **  Merged History Encounter **       Social Determinants of Health   Financial Resource Strain: Not on file  Food Insecurity: Not on file  Transportation Needs: Not on file  Physical Activity: Not on file  Stress: Not on file  Social Connections: Not on file  Intimate Partner Violence: Not on file    FAMILY HISTORY: Family History  Problem Relation Age of Onset   Breast cancer Paternal Grandmother    Breast cancer Sister        dx 64s, recurrence x3   Diabetes Sister    Diabetes Father    Throat cancer Maternal Grandmother     ALLERGIES:  has No Known Allergies.  MEDICATIONS:  Current Outpatient Medications  Medication Sig Dispense Refill   chlorhexidine (PERIDEX) 0.12 % solution Use as directed 10 mLs in the mouth or throat 2 (two) times daily. 473 mL 2   cholecalciferol (VITAMIN D) 25 MCG (1000 UNIT) tablet Take 1,000 Units by mouth daily.     dexamethasone (DECADRON) 4 MG tablet Take 2 tablets once a day for 3 days after carboplatin and AC chemotherapy. Take with food. 30 tablet 1   diclofenac Sodium (VOLTAREN) 1 % GEL Apply 1 application topically daily.  Flaxseed, Linseed, (FLAXSEED OIL PO) Take 2 tablets by mouth daily.     gabapentin (NEURONTIN) 600 MG tablet Take 600 mg by mouth every morning.     Glucosamine-Chondroitin (MOVE FREE PO) Take 1 tablet by mouth daily.     levonorgestrel (MIRENA) 20 MCG/DAY IUD 1 each by Intrauterine route once.     lidocaine-prilocaine (EMLA) cream Apply to affected area once 30 g 3   meloxicam (MOBIC) 15 MG tablet Take 15 mg by mouth daily.     ondansetron (ZOFRAN) 8 MG tablet Take 1 tablet (8 mg total) by mouth 2 (two) times daily as needed. Start on the third day after carboplatin and AC chemotherapy. (Patient not taking: No sig reported) 30 tablet 1   prochlorperazine (COMPAZINE) 10 MG tablet Take 1 tablet (10 mg total) by mouth every 6 (six) hours as needed (Nausea or vomiting). (Patient not taking: No sig reported) 30 tablet 1    No current facility-administered medications for this visit.   Facility-Administered Medications Ordered in Other Visits  Medication Dose Route Frequency Provider Last Rate Last Admin   heparin lock flush 100 unit/mL  500 Units Intravenous Once Earlie Server, MD         PHYSICAL EXAMINATION: ECOG PERFORMANCE STATUS: 0 - Asymptomatic Vitals:   06/30/21 0844  BP: 122/85  Pulse: 75  Resp: 18  Temp: (!) 96.8 F (36 C)   Filed Weights   06/30/21 0844  Weight: 227 lb 8 oz (103.2 kg)    Physical Exam Constitutional:      General: She is not in acute distress. HENT:     Head: Normocephalic and atraumatic.  Eyes:     General: No scleral icterus. Cardiovascular:     Rate and Rhythm: Normal rate and regular rhythm.     Heart sounds: Normal heart sounds.  Pulmonary:     Effort: Pulmonary effort is normal. No respiratory distress.     Breath sounds: No wheezing.  Abdominal:     General: Bowel sounds are normal. There is no distension.     Palpations: Abdomen is soft.  Musculoskeletal:        General: No deformity. Normal range of motion.     Cervical back: Normal range of motion and neck supple.  Skin:    General: Skin is warm and dry.     Findings: No erythema or rash.  Neurological:     Mental Status: She is alert and oriented to person, place, and time. Mental status is at baseline.     Cranial Nerves: No cranial nerve deficit.     Coordination: Coordination normal.  Psychiatric:        Bates and Affect: Bates normal.   Left breast mass has decreased in size, about 1cm now.   LABORATORY DATA:  I have reviewed the data as listed Lab Results  Component Value Date   WBC 3.4 (L) 06/30/2021   HGB 13.6 06/30/2021   HCT 42.0 06/30/2021   MCV 85.7 06/30/2021   PLT 159 06/30/2021   Recent Labs    06/16/21 0827 06/23/21 0800 06/30/21 0757  NA 135 138 137  K 4.5 4.0 4.2  CL 103 104 104  CO2 _0 GLUCOSE 140* 92 92  BUN 26* 16 20  CREATININE 0.73 0.62 0.65   CALCIUM 9.2 8.9 9.2  GFRNONAA >60 >60 >60  PROT 7.2 7.1 7.6  ALBUMIN 4.1 3.9 4.3  AST 57* 36 43*  ALT 81* 64* 76*  ALKPHOS 77  77 91  BILITOT 0.7 0.5 0.6    Iron/TIBC/Ferritin/ %Sat No results found for: IRON, TIBC, FERRITIN, IRONPCTSAT    RADIOGRAPHIC STUDIES: I have personally reviewed the radiological images as listed and agreed with the findings in the report. MM CLIP PLACEMENT LEFT  Result Date: 06/02/2021 CLINICAL DATA:  Status post MR guided core biopsy of posterior extent of known LEFT breast malignancy. EXAM: DIAGNOSTIC LEFT MAMMOGRAM POST MRI BIOPSY COMPARISON:  Previous exam(s). FINDINGS: Mammographic images were obtained following MRI guided biopsy of biopsy of nodularity posterior to known malignancy in the UPPER-OUTER QUADRANT of the LEFT breast and placement of a barbell shaped clip. The biopsy marking clip is in expected position at the site of biopsy. The barbell and twist type clip placed at the original biopsy are 3.2 centimeters apart on the true LATERAL projection. IMPRESSION: Appropriate positioning of the barbell shaped biopsy marking clip at the site of biopsy in the UPPER-OUTER QUADRANT LEFT breast. Final Assessment: Post Procedure Mammograms for Marker Placement Electronically Signed   By: Elizabeth  Brown M.D.   On: 06/02/2021 11:02  MR LT BREAST BX W LOC DEV 1ST LESION IMAGE BX SPEC MR GUIDE  Addendum Date: 06/04/2021   ADDENDUM REPORT: 06/04/2021 11:12 ADDENDUM: Pathology revealed DUCTAL CARCINOMA IN SITU, HIGH-GRADE of the LEFT breast, upper outer quadrant, posterior aspect, (barbell clip). This was found to be concordant by Dr. Elizabeth Brown. Pathology results were discussed with the patient by telephone. The patient reported doing well after the biopsy with tenderness at the site. Post biopsy instructions and care were reviewed and questions were answered. The patient was encouraged to call [facility] for any additional concerns.  My direct phone number was provided. The patient has a recent diagnosis of LEFT breast cancer and should follow her outlined treatment plan. Pathology results reported by Lynne Bailey, RN on 06/04/2021. Electronically Signed   By: Elizabeth  Brown M.D.   On: 06/04/2021 11:12   Result Date: 06/04/2021 CLINICAL DATA:  Patient presents for MR guided core biopsy of the posterior aspect of known LEFT breast malignancy. EXAM: MRI GUIDED CORE NEEDLE BIOPSY OF THE LEFT BREAST TECHNIQUE: Multiplanar, multisequence MR imaging of the LEFT breast was performed both before and after administration of intravenous contrast. CONTRAST:  10 ml of Gadavist COMPARISON:  Previous exams. FINDINGS: I met with the patient, and we discussed the procedure of MRI guided biopsy, including risks, benefits, and alternatives. Specifically, we discussed the risks of infection, bleeding, tissue injury, clip migration, and inadequate sampling. Informed, written consent was given. The usual time out protocol was performed immediately prior to the procedure. Using sterile technique, 1% Lidocaine, MRI guidance, and a 9 gauge vacuum assisted device, biopsy was performed of enhancing nodules posterior to the known malignancy in the UPPER-OUTER QUADRANT of the LEFT breast using a LATERAL to MEDIAL approach. At the conclusion of the procedure, a barbell shaped tissue marker clip was deployed into the biopsy cavity. Follow-up 2-view mammogram was performed and dictated separately. IMPRESSION: MRI guided biopsy of LEFT breast .  No apparent complications. Electronically Signed: By: Elizabeth  Brown M.D. On: 06/02/2021 11:00     ASSESSMENT & PLAN:  1. Encounter for antineoplastic chemotherapy   2. Carcinoma of upper-outer quadrant of left breast in female, estrogen receptor negative (HCC)   3. Port-A-Cath in place   4. Transaminitis   5. Chemotherapy-induced neutropenia (HCC)   .Cancer Staging Carcinoma of upper-outer quadrant of left breast in  female, estrogen receptor negative (  [place]) Staging form: Breast, AJCC 8th Edition - Clinical stage from 04/29/2021: Stage IIB (cT2, cN0, cM0, G3, ER-, PR-, HER2-) - Signed by Earlie Server, MD on 06/04/2021   #Left breast invasive mammary carcinoma, triple negative, + high-grade DCIS ER positive cT2 N0, stage IIB Clinically she is doing well.  Labs are reviewed and discussed with patient. Proceed with cycle 2 day 1 pembro/carbo AUC2/Taxol. [ Carboplatin AUC 5 Q3 weeks change to weekly carbo AUC 2].   # Chemotherapy induced anemia, Hb normalized.   # Neutropenia, chemotherapy induced.  Improved. Peridex solution swish and spit BID, rx sent.   #Transaminitis, stable.  Supportive care measures are necessary for patient well-being and will be provided as necessary. We spent sufficient time to discuss many aspect of care, questions were answered to patient's satisfaction.  All questions were answered. The patient knows to call the clinic with any problems questions or concerns.  cc Renee Rival, NP    Return of visit: 1 week, weekly Norma Fredrickson AUC 2 with Taxol    Earlie Server, MD, [name] Hematology Oncology [facility] Pager- [phone] 06/30/2021

## 2021-06-30 NOTE — Patient Instructions (Signed)
[facility]ER CENTER [place] [facility]  Discharge Instructions: Thank you for choosing [facility] to provide your oncology and hematology care.  If you have a lab appointment with the Cancer Center, please go directly to the Cancer Center and check in at the registration area.  Wear comfortable clothing and clothing appropriate for easy access to any Portacath or PICC line.   We strive to give you quality time with your provider. You may need to reschedule your appointment if you arrive late (15 or more minutes).  Arriving late affects you and other patients whose appointments are after yours.  Also, if you miss three or more appointments without notifying the office, you may be dismissed from the clinic at the provider's discretion.      For prescription refill requests, have your pharmacy contact our office and allow 72 hours for refills to be completed.    Today you received the following chemotherapy and/or immunotherapy agents Keytruda, Taxol and Carboplatin        To help prevent nausea and vomiting after your treatment, we encourage you to take your nausea medication as directed.  BELOW ARE SYMPTOMS THAT SHOULD BE REPORTED IMMEDIATELY: *FEVER GREATER THAN 100.4 F (38 C) OR HIGHER *CHILLS OR SWEATING *NAUSEA AND VOMITING THAT IS NOT CONTROLLED WITH YOUR NAUSEA MEDICATION *UNUSUAL SHORTNESS OF BREATH *UNUSUAL BRUISING OR BLEEDING *URINARY PROBLEMS (pain or burning when urinating, or frequent urination) *BOWEL PROBLEMS (unusual diarrhea, constipation, pain near the anus) TENDERNESS IN MOUTH AND THROAT WITH OR WITHOUT PRESENCE OF ULCERS (sore throat, sores in mouth, or a toothache) UNUSUAL RASH, SWELLING OR PAIN  UNUSUAL VAGINAL DISCHARGE OR ITCHING   Items with * indicate a potential emergency and should be followed up as soon as possible or go to the Emergency Department if any problems should occur.  Please show the CHEMOTHERAPY ALERT CARD or IMMUNOTHERAPY  ALERT CARD at check-in to the Emergency Department and triage nurse.  Should you have questions after your visit or need to cancel or reschedule your appointment, please contact [facility] [place] [facility]  [phone] and follow the prompts.  Office hours are 8:00 a.m. to 4:30 p.m. Monday - Friday. Please note that voicemails left after 4:00 p.m. may not be returned until the following business day.  We are closed weekends and major holidays. You have access to a nurse at all times for urgent questions. Please call the main number to the clinic [phone] and follow the prompts.  For any non-urgent questions, you may also contact your provider using MyChart. We now offer e-Visits for anyone 18 and older to request care online for non-urgent symptoms. For details visit [email or web address].   Also download the MyChart app! Go to the app store, search "MyChart", open the app, select [facility], and log in with your MyChart username and password.  Due to Covid, a mask is required upon entering the hospital/clinic. If you do not have a mask, one will be given to you upon arrival. For doctor visits, patients may have 1 support person aged 18 or older with them. For treatment visits, patients cannot have anyone with them due to current Covid guidelines and our immunocompromised population.  

## 2021-06-30 NOTE — Progress Notes (Signed)
Patient here for follow up. Pt reports that she has swelling to left arm.

## 2021-07-02 ENCOUNTER — Other Ambulatory Visit: Payer: Self-pay

## 2021-07-02 DIAGNOSIS — C50412 Malignant neoplasm of upper-outer quadrant of left female breast: Secondary | ICD-10-CM

## 2021-07-02 DIAGNOSIS — Z171 Estrogen receptor negative status [ER-]: Secondary | ICD-10-CM

## 2021-07-02 NOTE — Progress Notes (Signed)
error 

## 2021-07-05 ENCOUNTER — Encounter: Payer: Self-pay | Admitting: Oncology

## 2021-07-05 MED ORDER — PROCHLORPERAZINE MALEATE 10 MG PO TABS: 10.0000 mg | ORAL_TABLET | Freq: Four times a day (QID) | ORAL | 1 refills | Status: DC | PRN

## 2021-07-07 ENCOUNTER — Inpatient Hospital Stay (HOSPITAL_BASED_OUTPATIENT_CLINIC_OR_DEPARTMENT_OTHER): Admitting: Oncology

## 2021-07-07 ENCOUNTER — Inpatient Hospital Stay

## 2021-07-07 ENCOUNTER — Encounter: Payer: Self-pay | Admitting: Oncology

## 2021-07-07 VITALS — BP 111/76 | HR 85 | Temp 97.7°F | Resp 18 | Wt 228.9 lb

## 2021-07-07 DIAGNOSIS — C50412 Malignant neoplasm of upper-outer quadrant of left female breast: Secondary | ICD-10-CM | POA: Diagnosis not present

## 2021-07-07 DIAGNOSIS — Z5111 Encounter for antineoplastic chemotherapy: Secondary | ICD-10-CM

## 2021-07-07 DIAGNOSIS — D701 Agranulocytosis secondary to cancer chemotherapy: Secondary | ICD-10-CM | POA: Diagnosis not present

## 2021-07-07 DIAGNOSIS — R7401 Elevation of levels of liver transaminase levels: Secondary | ICD-10-CM | POA: Diagnosis not present

## 2021-07-07 DIAGNOSIS — Z171 Estrogen receptor negative status [ER-]: Secondary | ICD-10-CM | POA: Diagnosis not present

## 2021-07-07 DIAGNOSIS — Z5112 Encounter for antineoplastic immunotherapy: Secondary | ICD-10-CM | POA: Diagnosis not present

## 2021-07-07 DIAGNOSIS — T451X5A Adverse effect of antineoplastic and immunosuppressive drugs, initial encounter: Secondary | ICD-10-CM | POA: Insufficient documentation

## 2021-07-07 LAB — CBC WITH DIFFERENTIAL/PLATELET
Abs Immature Granulocytes: 0 10*3/uL (ref 0.00–0.07)
Basophils Absolute: 0 10*3/uL (ref 0.0–0.1)
Basophils Relative: 1 %
Eosinophils Absolute: 0 10*3/uL (ref 0.0–0.5)
Eosinophils Relative: 1 %
HCT: 39.5 % (ref 36.0–46.0)
Hemoglobin: 12.7 g/dL (ref 12.0–15.0)
Immature Granulocytes: 0 %
Lymphocytes Relative: 49 %
Lymphs Abs: 1.4 10*3/uL (ref 0.7–4.0)
MCH: 27.4 pg (ref 26.0–34.0)
MCHC: 32.2 g/dL (ref 30.0–36.0)
MCV: 85.1 fL (ref 80.0–100.0)
Monocytes Absolute: 0.2 10*3/uL (ref 0.1–1.0)
Monocytes Relative: 8 %
Neutro Abs: 1.2 10*3/uL — ABNORMAL LOW (ref 1.7–7.7)
Neutrophils Relative %: 41 %
Platelets: 227 10*3/uL (ref 150–400)
RBC: 4.64 MIL/uL (ref 3.87–5.11)
RDW: 13.2 % (ref 11.5–15.5)
Smear Review: NORMAL
WBC: 2.8 10*3/uL — ABNORMAL LOW (ref 4.0–10.5)
nRBC: 0 % (ref 0.0–0.2)

## 2021-07-07 LAB — COMPREHENSIVE METABOLIC PANEL
ALT: 32 U/L (ref 0–44)
AST: 22 U/L (ref 15–41)
Albumin: 4.1 g/dL (ref 3.5–5.0)
Alkaline Phosphatase: 98 U/L (ref 38–126)
Anion gap: 7 (ref 5–15)
BUN: 19 mg/dL (ref 6–20)
CO2: 26 mmol/L (ref 22–32)
Calcium: 8.9 mg/dL (ref 8.9–10.3)
Chloride: 103 mmol/L (ref 98–111)
Creatinine, Ser: 0.66 mg/dL (ref 0.44–1.00)
GFR, Estimated: >60 mL/min (ref >60)
Glucose, Bld: 106 mg/dL — ABNORMAL HIGH (ref 70–99)
Potassium: 4.1 mmol/L (ref 3.5–5.1)
Sodium: 136 mmol/L (ref 135–145)
Total Bilirubin: 0.5 mg/dL (ref 0.3–1.2)
Total Protein: 7.4 g/dL (ref 6.5–8.1)

## 2021-07-07 MED ORDER — SODIUM CHLORIDE 0.9 % IV SOLN
300.0000 mg | Freq: Once | INTRAVENOUS | Status: AC
  Administered 2021-07-07: 300 mg via INTRAVENOUS
  Filled 2021-07-07: qty 30

## 2021-07-07 MED ORDER — SODIUM CHLORIDE 0.9 % IV SOLN
80.0000 mg/m2 | Freq: Once | INTRAVENOUS | Status: AC
  Administered 2021-07-07: 168 mg via INTRAVENOUS
  Filled 2021-07-07: qty 28

## 2021-07-07 MED ORDER — DIPHENHYDRAMINE HCL 50 MG/ML IJ SOLN
50.0000 mg | Freq: Once | INTRAMUSCULAR | Status: AC
  Administered 2021-07-07: 50 mg via INTRAVENOUS
  Filled 2021-07-07: qty 1

## 2021-07-07 MED ORDER — FAMOTIDINE 20 MG IN NS 100 ML IVPB
20.0000 mg | Freq: Once | INTRAVENOUS | Status: AC
  Administered 2021-07-07: 20 mg via INTRAVENOUS
  Filled 2021-07-07: qty 100
  Filled 2021-07-07: qty 20

## 2021-07-07 MED ORDER — SODIUM CHLORIDE 0.9 % IV SOLN
10.0000 mg | Freq: Once | INTRAVENOUS | Status: AC
  Administered 2021-07-07: 10 mg via INTRAVENOUS
  Filled 2021-07-07: qty 10

## 2021-07-07 MED ORDER — SODIUM CHLORIDE 0.9 % IV SOLN
Freq: Once | INTRAVENOUS | Status: AC
  Filled 2021-07-07: qty 250

## 2021-07-07 MED ORDER — SODIUM CHLORIDE 0.9% FLUSH
10.0000 mL | INTRAVENOUS | Status: DC | PRN
Start: 2021-07-07 — End: 2021-07-07
  Filled 2021-07-07: qty 10

## 2021-07-07 MED ORDER — HEPARIN SOD (PORK) LOCK FLUSH 100 UNIT/ML IV SOLN
500.0000 [IU] | Freq: Once | INTRAVENOUS | Status: AC | PRN
  Administered 2021-07-07: 500 [IU]
  Filled 2021-07-07: qty 5

## 2021-07-07 MED ORDER — HEPARIN SOD (PORK) LOCK FLUSH 100 UNIT/ML IV SOLN
INTRAVENOUS | Status: AC
  Filled 2021-07-07: qty 5

## 2021-07-07 NOTE — Patient Instructions (Signed)
[facility]  Discharge Instructions: Thank you for choosing [facility] to provide your oncology and hematology care.  If you have a lab appointment with the Cancer Center, please go directly to the Cancer Center and check in at the registration area.  Wear comfortable clothing and clothing appropriate for easy access to any Portacath or PICC line.   We strive to give you quality time with your provider. You may need to reschedule your appointment if you arrive late (15 or more minutes).  Arriving late affects you and other patients whose appointments are after yours.  Also, if you miss three or more appointments without notifying the office, you may be dismissed from the clinic at the provider's discretion.      For prescription refill requests, have your pharmacy contact our office and allow 72 hours for refills to be completed.    Today you received the following chemotherapy and/or immunotherapy agents - paclitaxel, carboplatin      To help prevent nausea and vomiting after your treatment, we encourage you to take your nausea medication as directed.  BELOW ARE SYMPTOMS THAT SHOULD BE REPORTED IMMEDIATELY: *FEVER GREATER THAN 100.4 F (38 C) OR HIGHER *CHILLS OR SWEATING *NAUSEA AND VOMITING THAT IS NOT CONTROLLED WITH YOUR NAUSEA MEDICATION *UNUSUAL SHORTNESS OF BREATH *UNUSUAL BRUISING OR BLEEDING *URINARY PROBLEMS (pain or burning when urinating, or frequent urination) *BOWEL PROBLEMS (unusual diarrhea, constipation, pain near the anus) TENDERNESS IN MOUTH AND THROAT WITH OR WITHOUT PRESENCE OF ULCERS (sore throat, sores in mouth, or a toothache) UNUSUAL RASH, SWELLING OR PAIN  UNUSUAL VAGINAL DISCHARGE OR ITCHING   Items with * indicate a potential emergency and should be followed up as soon as possible or go to the Emergency Department if any problems should occur.  Please show the CHEMOTHERAPY ALERT CARD or IMMUNOTHERAPY ALERT  CARD at check-in to the Emergency Department and triage nurse.  Should you have questions after your visit or need to cancel or reschedule your appointment, please contact [facility]  [phone] and follow the prompts.  Office hours are 8:00 a.m. to 4:30 p.m. Monday - Friday. Please note that voicemails left after 4:00 p.m. may not be returned until the following business day.  We are closed weekends and major holidays. You have access to a nurse at all times for urgent questions. Please call the main number to the clinic [phone] and follow the prompts.  For any non-urgent questions, you may also contact your provider using MyChart. We now offer e-Visits for anyone 18 and older to request care online for non-urgent symptoms. For details visit [email or web address].   Also download the MyChart app! Go to the app store, search "MyChart", open the app, select [facility], and log in with your MyChart username and password.  Due to Covid, a mask is required upon entering the hospital/clinic. If you do not have a mask, one will be given to you upon arrival. For doctor visits, patients may have 1 support person aged 18 or older with them. For treatment visits, patients cannot have anyone with them due to current Covid guidelines and our immunocompromised population.   Paclitaxel injection What is this medication? PACLITAXEL (PAK li TAX el) is a chemotherapy drug. It targets fast dividing cells, like cancer cells, and causes these cells to die. This medicine is used to treat ovarian cancer, breast cancer, lung cancer, Kaposi's sarcoma, andother cancers. This medicine may be used for other purposes;   ask your health care provider orpharmacist if you have questions. COMMON BRAND NAME(S): Onxol, Taxol What should I tell my care team before I take this medication? They need to know if you have any of these conditions: history of irregular heartbeat liver  disease low blood counts, like low white cell, platelet, or red cell counts lung or breathing disease, like asthma tingling of the fingers or toes, or other nerve disorder an unusual or allergic reaction to paclitaxel, alcohol, polyoxyethylated castor oil, other chemotherapy, other medicines, foods, dyes, or preservatives pregnant or trying to get pregnant breast-feeding How should I use this medication? This drug is given as an infusion into a vein. It is administered in a hospitalor clinic by a specially trained health care professional. Talk to your pediatrician regarding the use of this medicine in children.Special care may be needed. Overdosage: If you think you have taken too much of this medicine contact apoison control center or emergency room at once. NOTE: This medicine is only for you. Do not share this medicine with others. What if I miss a dose? It is important not to miss your dose. Call your doctor or health careprofessional if you are unable to keep an appointment. What may interact with this medication? Do not take this medicine with any of the following medications: live virus vaccines This medicine may also interact with the following medications: antiviral medicines for hepatitis, HIV or AIDS certain antibiotics like erythromycin and clarithromycin certain medicines for fungal infections like ketoconazole and itraconazole certain medicines for seizures like carbamazepine, phenobarbital, phenytoin gemfibrozil nefazodone rifampin St. John's wort This list may not describe all possible interactions. Give your health care provider a list of all the medicines, herbs, non-prescription drugs, or dietary supplements you use. Also tell them if you smoke, drink alcohol, or use illegaldrugs. Some items may interact with your medicine. What should I watch for while using this medication? Your condition will be monitored carefully while you are receiving this medicine. You will  need important blood work done while you are taking thismedicine. This medicine can cause serious allergic reactions. To reduce your risk you will need to take other medicine(s) before treatment with this medicine. If you experience allergic reactions like skin rash, itching or hives, swelling of theface, lips, or tongue, tell your doctor or health care professional right away. In some cases, you may be given additional medicines to help with side effects.Follow all directions for their use. This drug may make you feel generally unwell. This is not uncommon, as chemotherapy can affect healthy cells as well as cancer cells. Report any side effects. Continue your course of treatment even though you feel ill unless yourdoctor tells you to stop. Call your doctor or health care professional for advice if you get a fever, chills or sore throat, or other symptoms of a cold or flu. Do not treat yourself. This drug decreases your body's ability to fight infections. Try toavoid being around people who are sick. This medicine may increase your risk to bruise or bleed. Call your doctor orhealth care professional if you notice any unusual bleeding. Be careful brushing and flossing your teeth or using a toothpick because you may get an infection or bleed more easily. If you have any dental work done,tell your dentist you are receiving this medicine. Avoid taking products that contain aspirin, acetaminophen, ibuprofen, naproxen, or ketoprofen unless instructed by your doctor. These medicines may hide afever. Do not become pregnant while taking this medicine. Women should inform their   doctor if they wish to become pregnant or think they might be pregnant. There is a potential for serious side effects to an unborn child. Talk to your health care professional or pharmacist for more information. Do not breast-feed aninfant while taking this medicine. Men are advised not to father a child while receiving this medicine. This  product may contain alcohol. Ask your pharmacist or healthcare provider if this medicine contains alcohol. Be sure to tell all healthcare providers you are taking this medicine. Certain medicines, like metronidazole and disulfiram, can cause an unpleasant reaction when taken with alcohol. The reaction includes flushing, headache, nausea, vomiting, sweating, and increased thirst. Thereaction can last from 30 minutes to several hours. What side effects may I notice from receiving this medication? Side effects that you should report to your doctor or health care professionalas soon as possible: allergic reactions like skin rash, itching or hives, swelling of the face, lips, or tongue breathing problems changes in vision fast, irregular heartbeat high or low blood pressure mouth sores pain, tingling, numbness in the hands or feet signs of decreased platelets or bleeding - bruising, pinpoint red spots on the skin, black, tarry stools, blood in the urine signs of decreased red blood cells - unusually weak or tired, feeling faint or lightheaded, falls signs of infection - fever or chills, cough, sore throat, pain or difficulty passing urine signs and symptoms of liver injury like dark yellow or brown urine; general ill feeling or flu-like symptoms; light-colored stools; loss of appetite; nausea; right upper belly pain; unusually weak or tired; yellowing of the eyes or skin swelling of the ankles, feet, hands unusually slow heartbeat Side effects that usually do not require medical attention (report to yourdoctor or health care professional if they continue or are bothersome): diarrhea hair loss loss of appetite muscle or joint pain nausea, vomiting pain, redness, or irritation at site where injected tiredness This list may not describe all possible side effects. Call your doctor for medical advice about side effects. You may report side effects to FDA at1-800-FDA-1088. Where should I keep my  medication? This drug is given in a hospital or clinic and will not be stored at home. NOTE: This sheet is a summary. It may not cover all possible information. If you have questions about this medicine, talk to your doctor, pharmacist, orhealth care provider.  2022 Elsevier/Gold Standard (2019-11-06 13:37:23)  Carboplatin injection What is this medication? CARBOPLATIN (KAR boe pla tin) is a chemotherapy drug. It targets fast dividing cells, like cancer cells, and causes these cells to die. This medicine is usedto treat ovarian cancer and many other cancers. This medicine may be used for other purposes; ask your health care provider orpharmacist if you have questions. COMMON BRAND NAME(S): Paraplatin What should I tell my care team before I take this medication? They need to know if you have any of these conditions: blood disorders hearing problems kidney disease recent or ongoing radiation therapy an unusual or allergic reaction to carboplatin, cisplatin, other chemotherapy, other medicines, foods, dyes, or preservatives pregnant or trying to get pregnant breast-feeding How should I use this medication? This drug is usually given as an infusion into a vein. It is administered in ahospital or clinic by a specially trained health care professional. Talk to your pediatrician regarding the use of this medicine in children.Special care may be needed. Overdosage: If you think you have taken too much of this medicine contact apoison control center or emergency room at once. NOTE: This medicine   is only for you. Do not share this medicine with others. What if I miss a dose? It is important not to miss a dose. Call your doctor or health careprofessional if you are unable to keep an appointment. What may interact with this medication? medicines for seizures medicines to increase blood counts like filgrastim, pegfilgrastim, sargramostim some antibiotics like amikacin, gentamicin, neomycin,  streptomycin, tobramycin vaccines Talk to your doctor or health care professional before taking any of thesemedicines: acetaminophen aspirin ibuprofen ketoprofen naproxen This list may not describe all possible interactions. Give your health care provider a list of all the medicines, herbs, non-prescription drugs, or dietary supplements you use. Also tell them if you smoke, drink alcohol, or use illegaldrugs. Some items may interact with your medicine. What should I watch for while using this medication? Your condition will be monitored carefully while you are receiving this medicine. You will need important blood work done while you are taking thismedicine. This drug may make you feel generally unwell. This is not uncommon, as chemotherapy can affect healthy cells as well as cancer cells. Report any side effects. Continue your course of treatment even though you feel ill unless yourdoctor tells you to stop. In some cases, you may be given additional medicines to help with side effects.Follow all directions for their use. Call your doctor or health care professional for advice if you get a fever, chills or sore throat, or other symptoms of a cold or flu. Do not treat yourself. This drug decreases your body's ability to fight infections. Try toavoid being around people who are sick. This medicine may increase your risk to bruise or bleed. Call your doctor orhealth care professional if you notice any unusual bleeding. Be careful brushing and flossing your teeth or using a toothpick because you may get an infection or bleed more easily. If you have any dental work done,tell your dentist you are receiving this medicine. Avoid taking products that contain aspirin, acetaminophen, ibuprofen, naproxen, or ketoprofen unless instructed by your doctor. These medicines may hide afever. Do not become pregnant while taking this medicine. Women should inform their doctor if they wish to become pregnant or think  they might be pregnant. There is a potential for serious side effects to an unborn child. Talk to your health care professional or pharmacist for more information. Do not breast-feed aninfant while taking this medicine. What side effects may I notice from receiving this medication? Side effects that you should report to your doctor or health care professionalas soon as possible: allergic reactions like skin rash, itching or hives, swelling of the face, lips, or tongue signs of infection - fever or chills, cough, sore throat, pain or difficulty passing urine signs of decreased platelets or bleeding - bruising, pinpoint red spots on the skin, black, tarry stools, nosebleeds signs of decreased red blood cells - unusually weak or tired, fainting spells, lightheadedness breathing problems changes in hearing changes in vision chest pain high blood pressure low blood counts - This drug may decrease the number of white blood cells, red blood cells and platelets. You may be at increased risk for infections and bleeding. nausea and vomiting pain, swelling, redness or irritation at the injection site pain, tingling, numbness in the hands or feet problems with balance, talking, walking trouble passing urine or change in the amount of urine Side effects that usually do not require medical attention (report to yourdoctor or health care professional if they continue or are bothersome): hair loss loss of appetite metallic   taste in the mouth or changes in taste This list may not describe all possible side effects. Call your doctor for medical advice about side effects. You may report side effects to FDA at1-800-FDA-1088. Where should I keep my medication? This drug is given in a hospital or clinic and will not be stored at home. NOTE: This sheet is a summary. It may not cover all possible information. If you have questions about this medicine, talk to your doctor, pharmacist, orhealth care provider.  2022  Elsevier/Gold Standard (2008-03-11 14:38:05)  

## 2021-07-07 NOTE — Progress Notes (Signed)
Patient here for follow up. No new concerns voiced.  °

## 2021-07-07 NOTE — Progress Notes (Signed)
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  female with PMH listed below was seen in consultation at the request of  Renee Rival, NP  for evaluation of triple negative breast cancer.  03/30/2021, screening mammogram with 3D 2 nodular areas of asymmetric density demonstrated within the superior lateral aspect of the left breast middle third depth.  Finding was best appreciated on 3D tomosynthesis imaging. Targeted ultrasound showed left upper outer breast 1:30 position 1.1 cm round hypoechoic mass with angular margins. 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 Bary Castilla yesterday.  She presents to establish care with me today Patient did not notice this left mass prior to the mammogram.   Case was discussed on Tumor board.  Pathology reports and mammogram/ultrasound were done at outside facility and results were reviewed and discussed with patient and her husband.  LN status was not mentioned on her [place]. Recommend additional images with mammogram and MRI, neoadjuvant chemotherapy  Family history of breast cancer: Breast cancer in her sister, her late 70s, early 56s; and being paternal grandmother Menarche: 15 Age at first live childbirth: 88  patient has 2 sons and 1 daughter.  Daughter has passed away Used OCP:  Used estrogen and progesterone therapy: Denies History of Radiation to the chest: Denies Previous of breast biopsy: Denies  /25/2022 diagnostic mammogram of left breast showed 2.1 cm biopsy-proven  malignancy in the lateral left breast at 2:00, 4 cm from nipple.  Directly adjacent cystic mass, 1.5 cm in size, consistent with biopsy hematoma.  1 abnormal and 1 borderline abnormal left axillary lymph node  05/18/2021 bilateral breast MRI with and without contrast showed 2.5 x 3 x 2.5 cm hematoma containing biopsy clip artifact within the upper outer left breast.  1.6 cm nodular non-mass-like enhancement 2.5 cm posterior/superior to the biopsy clip may represent biopsy-proven malignancy or additional areas of malignancy.  Single abnormal appearing left axillary lymph node with eccentric cortical thickening.  No MRI evidence of the right breast malignancy.  #05/07/2021, Mediport was placed by Dr. Bary Castilla #05/14/2021 echocardiogram showed LVEF 60-55%  05/20/2021 Slide consultation of her left breast biopsy from outside institution - invasive mammary carcinoma, no special type, grade 3, DCIS and LVI not identified. ER negative, PR negative, HER2 IHC negative. Ki 67 70-80%    05/20/2021 ultrasound-guided left axillary lymph node was negative for malignancy.  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 D15- not done due to cytopenia  INTERVAL HISTORY Peggy Bates is a 55 y.o. female who has above history reviewed by me today presents for follow up visit for management of left triple negative breast cancer Problems and complaints are listed below: Patient is on neoadjuvant chemotherapy Overall she tolerates well except cytopenia. Today she reports feeling well, she has no new complaints.  No nausea vomiting diarrhea, fever chills   Review of Systems  Constitutional:  Negative for appetite change, chills, fatigue  and fever.  HENT:   Negative for hearing loss and voice change.   Eyes:  Negative for eye problems.   Respiratory:  Negative for chest tightness and cough.   Cardiovascular:  Negative for chest pain.  Gastrointestinal:  Negative for abdominal distention, abdominal pain and blood in stool.  Endocrine: Negative for hot flashes.  Genitourinary:  Negative for difficulty urinating and frequency.   Musculoskeletal:  Negative for arthralgias.  Skin:  Negative for itching and rash.  Neurological:  Negative for extremity weakness.  Hematological:  Negative for adenopathy.  Psychiatric/Behavioral:  Negative for confusion.    MEDICAL HISTORY:  Past Medical History:  Diagnosis Date   Arthritis    knees   COVID-19 12/2020   Depression    Family history of breast cancer    Goiter    Malignant neoplasm of left female breast ([place]) 04/07/2021    SURGICAL HISTORY: Past Surgical History:  Procedure Laterality Date   BREAST BIOPSY Left 04/07/2021   [facility] in [place]:u/s bx triple neg   BREAST BIOPSY Left 05/20/2021   [place] Axilla Bx, hydro 3 marker, path pending    KNEE ARTHROSCOPY     PORTACATH PLACEMENT Right 05/07/2021   Procedure: INSERTION PORT-A-CATH;  Surgeon: Robert Bellow, MD;  Location: [facility] ORS;  Service: General;  Laterality: Right;   TOOTH EXTRACTION     TRANSCERVICAL UTERINE FIBROID(S) ABLATION  2010    SOCIAL HISTORY: Social History   Socioeconomic History   Marital status: Married    Spouse name: Not on file   Number of children: Not on file   Years of education: Not on file   Highest education level: Not on file  Occupational History   Not on file  Tobacco Use   Smoking status: Never   Smokeless tobacco: Never  Vaping Use   Vaping Use: Never used  Substance and Sexual Activity   Alcohol use: Never   Drug use: Never   Sexual activity: Yes  Other Topics Concern   Not on file  Social History Narrative   ** Merged History Encounter **       Social Determinants of Health   Financial Resource Strain: Not on file  Food Insecurity: Not on file   Transportation Needs: Not on file  Physical Activity: Not on file  Stress: Not on file  Social Connections: Not on file  Intimate Partner Violence: Not on file    FAMILY HISTORY: Family History  Problem Relation Age of Onset   Breast cancer Paternal Grandmother    Breast cancer Sister        dx 31s, recurrence x3   Diabetes Sister    Diabetes Father    Throat cancer Maternal Grandmother     ALLERGIES:  has No Known Allergies.  MEDICATIONS:  Current Outpatient Medications  Medication Sig Dispense Refill   chlorhexidine (PERIDEX) 0.12 % solution Use as directed 10 mLs in the mouth or throat 2 (two) times daily. 473 mL 2   cholecalciferol (VITAMIN D) 25 MCG (1000 UNIT) tablet Take 1,000 Units by mouth daily.     dexamethasone (DECADRON) 4 MG tablet Take 2 tablets once a day for 3 days after carboplatin and AC chemotherapy. Take with food. 30 tablet 1   diclofenac Sodium (VOLTAREN) 1 % GEL Apply 1 application topically daily.     Flaxseed, Linseed, (FLAXSEED OIL PO) Take 2 tablets by mouth daily.     gabapentin (NEURONTIN) 600 MG tablet Take 600 mg by mouth every  morning.     Glucosamine-Chondroitin (MOVE FREE PO) Take 1 tablet by mouth daily.     levonorgestrel (MIRENA) 20 MCG/DAY IUD 1 each by Intrauterine route once.     lidocaine-prilocaine (EMLA) cream Apply to affected area once 30 g 3   meloxicam (MOBIC) 15 MG tablet Take 15 mg by mouth daily.     ondansetron (ZOFRAN) 8 MG tablet Take 1 tablet (8 mg total) by mouth 2 (two) times daily as needed. Start on the third day after carboplatin and AC chemotherapy. (Patient not taking: No sig reported) 30 tablet 1   prochlorperazine (COMPAZINE) 10 MG tablet Take 1 tablet (10 mg total) by mouth every 6 (six) hours as needed (Nausea or vomiting). (Patient not taking: Reported on 07/07/2021) 30 tablet 1   No current facility-administered medications for this visit.   Facility-Administered Medications Ordered in Other Visits  Medication  Dose Route Frequency Provider Last Rate Last Admin   sodium chloride flush (NS) 0.9 % injection 10 mL  10 mL Intracatheter PRN Earlie Server, MD         PHYSICAL EXAMINATION: ECOG PERFORMANCE STATUS: 0 - Asymptomatic Vitals:   07/07/21 0853  BP: 111/76  Pulse: 85  Resp: 18  Temp: 97.7 F (36.5 C)   Filed Weights   07/07/21 0853  Weight: 228 lb 14.4 oz (103.8 kg)    Physical Exam Constitutional:      General: She is not in acute distress. HENT:     Head: Normocephalic and atraumatic.  Eyes:     General: No scleral icterus. Cardiovascular:     Rate and Rhythm: Normal rate and regular rhythm.     Heart sounds: Normal heart sounds.  Pulmonary:     Effort: Pulmonary effort is normal. No respiratory distress.     Breath sounds: No wheezing.  Abdominal:     General: Bowel sounds are normal. There is no distension.     Palpations: Abdomen is soft.  Musculoskeletal:        General: No deformity. Normal range of motion.     Cervical back: Normal range of motion and neck supple.  Skin:    General: Skin is warm and dry.     Findings: No erythema or rash.  Neurological:     Mental Status: She is alert and oriented to person, place, and time. Mental status is at baseline.     Cranial Nerves: No cranial nerve deficit.     Coordination: Coordination normal.  Psychiatric:        Mood and Affect: Mood normal.     LABORATORY DATA:  I have reviewed the data as listed Lab Results  Component Value Date   WBC 2.8 (L) 07/07/2021   HGB 12.7 07/07/2021   HCT 39.5 07/07/2021   MCV 85.1 07/07/2021   PLT 227 07/07/2021   Recent Labs    06/23/21 0800 06/30/21 0757 07/07/21 0811  NA 138 137 136  K 4.0 4.2 4.1  CL 104 104 103  CO2 '27 25 26  ' GLUCOSE 92 92 106*  BUN '16 20 19  ' CREATININE 0.62 0.65 0.66  CALCIUM 8.9 9.2 8.9  GFRNONAA >60 >60 >60  PROT 7.1 7.6 7.4  ALBUMIN 3.9 4.3 4.1  AST 36 43* 22  ALT 64* 76* 32  ALKPHOS 77 91 98  BILITOT 0.5 0.6 0.5    Iron/TIBC/Ferritin/  %Sat No results found for: IRON, TIBC, FERRITIN, IRONPCTSAT    RADIOGRAPHIC STUDIES: I have personally reviewed the radiological images as  listed and agreed with the findings in the report. No results found.    ASSESSMENT & PLAN:  1. Carcinoma of upper-outer quadrant of left breast in female, estrogen receptor negative ([place])   2. Encounter for antineoplastic chemotherapy   3. Transaminitis   4. Chemotherapy-induced neutropenia ([place])   .Cancer Staging Carcinoma of upper-outer quadrant of left breast in female, estrogen receptor negative ([place]) Staging form: Breast, AJCC 8th Edition - Clinical stage from 04/29/2021: Stage IIB (cT2, cN0, cM0, G3, ER-, PR-, HER2-) - Signed by Earlie Server, MD on 06/04/2021   #Left breast invasive mammary carcinoma, triple negative, + high-grade DCIS ER positive cT2 N0, stage IIB Labs are reviewed and discussed with patient Proceed with cycle 2-day 8 carboplatin AUC 2/Taxol Patient will receive day 2 and day 3 Zarxio for G-CSF support  #Therapy induced neutropenia, ANC 1.2.  Continue close monitor.  Patient will receive short acting G-CSF support. Peridex solution swish and spit BID for prophylaxis.  #Transaminitis, improved  Supportive care measures are necessary for patient well-being and will be provided as necessary. We spent sufficient time to discuss many aspect of care, questions were answered to patient's satisfaction.  All questions were answered. The patient knows to call the clinic with any problems questions or concerns.  cc Renee Rival, NP    Return of visit: 1 week, weekly Norma Fredrickson AUC 2 with Taxol    Earlie Server, MD, [name] Hematology Oncology [facility] at [facility] Pager- [phone] 07/07/2021

## 2021-07-08 ENCOUNTER — Inpatient Hospital Stay

## 2021-07-08 DIAGNOSIS — Z5112 Encounter for antineoplastic immunotherapy: Secondary | ICD-10-CM | POA: Diagnosis not present

## 2021-07-08 DIAGNOSIS — Z171 Estrogen receptor negative status [ER-]: Secondary | ICD-10-CM

## 2021-07-08 DIAGNOSIS — C50412 Malignant neoplasm of upper-outer quadrant of left female breast: Secondary | ICD-10-CM

## 2021-07-08 MED ORDER — FILGRASTIM-SNDZ 480 MCG/0.8ML IJ SOSY
480.0000 ug | PREFILLED_SYRINGE | Freq: Once | INTRAMUSCULAR | Status: AC
  Administered 2021-07-08: 480 ug via SUBCUTANEOUS
  Filled 2021-07-08: qty 0.8

## 2021-07-09 ENCOUNTER — Inpatient Hospital Stay

## 2021-07-09 DIAGNOSIS — Z5112 Encounter for antineoplastic immunotherapy: Secondary | ICD-10-CM | POA: Diagnosis not present

## 2021-07-09 DIAGNOSIS — Z5111 Encounter for antineoplastic chemotherapy: Secondary | ICD-10-CM

## 2021-07-09 MED ORDER — FILGRASTIM-SNDZ 480 MCG/0.8ML IJ SOSY
480.0000 ug | PREFILLED_SYRINGE | Freq: Once | INTRAMUSCULAR | Status: AC
  Administered 2021-07-09: 480 ug via SUBCUTANEOUS
  Filled 2021-07-09: qty 0.8

## 2021-07-14 ENCOUNTER — Inpatient Hospital Stay (HOSPITAL_BASED_OUTPATIENT_CLINIC_OR_DEPARTMENT_OTHER): Admitting: Oncology

## 2021-07-14 ENCOUNTER — Inpatient Hospital Stay

## 2021-07-14 ENCOUNTER — Telehealth: Payer: Self-pay

## 2021-07-14 ENCOUNTER — Encounter: Payer: Self-pay | Admitting: Oncology

## 2021-07-14 VITALS — BP 132/88 | HR 78 | Temp 99.0°F | Resp 18 | Wt 232.0 lb

## 2021-07-14 DIAGNOSIS — T451X5D Adverse effect of antineoplastic and immunosuppressive drugs, subsequent encounter: Secondary | ICD-10-CM | POA: Diagnosis not present

## 2021-07-14 DIAGNOSIS — C50412 Malignant neoplasm of upper-outer quadrant of left female breast: Secondary | ICD-10-CM

## 2021-07-14 DIAGNOSIS — R7401 Elevation of levels of liver transaminase levels: Secondary | ICD-10-CM

## 2021-07-14 DIAGNOSIS — T451X5A Adverse effect of antineoplastic and immunosuppressive drugs, initial encounter: Secondary | ICD-10-CM

## 2021-07-14 DIAGNOSIS — D701 Agranulocytosis secondary to cancer chemotherapy: Secondary | ICD-10-CM | POA: Diagnosis not present

## 2021-07-14 DIAGNOSIS — Z171 Estrogen receptor negative status [ER-]: Secondary | ICD-10-CM

## 2021-07-14 DIAGNOSIS — Z5112 Encounter for antineoplastic immunotherapy: Secondary | ICD-10-CM | POA: Diagnosis not present

## 2021-07-14 DIAGNOSIS — Z5111 Encounter for antineoplastic chemotherapy: Secondary | ICD-10-CM

## 2021-07-14 LAB — COMPREHENSIVE METABOLIC PANEL
ALT: 30 U/L (ref 0–44)
AST: 25 U/L (ref 15–41)
Albumin: 4.2 g/dL (ref 3.5–5.0)
Alkaline Phosphatase: 73 U/L (ref 38–126)
Anion gap: 4 — ABNORMAL LOW (ref 5–15)
BUN: 13 mg/dL (ref 6–20)
CO2: 24 mmol/L (ref 22–32)
Calcium: 9 mg/dL (ref 8.9–10.3)
Chloride: 107 mmol/L (ref 98–111)
Creatinine, Ser: 0.6 mg/dL (ref 0.44–1.00)
GFR, Estimated: >60 mL/min (ref >60)
Glucose, Bld: 93 mg/dL (ref 70–99)
Potassium: 4.5 mmol/L (ref 3.5–5.1)
Sodium: 135 mmol/L (ref 135–145)
Total Bilirubin: 0.3 mg/dL (ref 0.3–1.2)
Total Protein: 7.4 g/dL (ref 6.5–8.1)

## 2021-07-14 LAB — CBC WITH DIFFERENTIAL/PLATELET
Abs Immature Granulocytes: 0.44 10*3/uL — ABNORMAL HIGH (ref 0.00–0.07)
Basophils Absolute: 0.1 10*3/uL (ref 0.0–0.1)
Basophils Relative: 1 %
Eosinophils Absolute: 0 10*3/uL (ref 0.0–0.5)
Eosinophils Relative: 0 %
HCT: 38.9 % (ref 36.0–46.0)
Hemoglobin: 12.4 g/dL (ref 12.0–15.0)
Immature Granulocytes: 9 %
Lymphocytes Relative: 35 %
Lymphs Abs: 1.8 10*3/uL (ref 0.7–4.0)
MCH: 27.7 pg (ref 26.0–34.0)
MCHC: 31.9 g/dL (ref 30.0–36.0)
MCV: 86.8 fL (ref 80.0–100.0)
Monocytes Absolute: 0.8 10*3/uL (ref 0.1–1.0)
Monocytes Relative: 16 %
Neutro Abs: 2 10*3/uL (ref 1.7–7.7)
Neutrophils Relative %: 39 %
Platelets: 460 10*3/uL — ABNORMAL HIGH (ref 150–400)
RBC: 4.48 MIL/uL (ref 3.87–5.11)
RDW: 13.7 % (ref 11.5–15.5)
WBC: 5.1 10*3/uL (ref 4.0–10.5)
nRBC: 0 % (ref 0.0–0.2)

## 2021-07-14 MED ORDER — SODIUM CHLORIDE 0.9 % IV SOLN
300.0000 mg | Freq: Once | INTRAVENOUS | Status: AC
  Administered 2021-07-14: 300 mg via INTRAVENOUS
  Filled 2021-07-14: qty 30

## 2021-07-14 MED ORDER — SODIUM CHLORIDE 0.9 % IV SOLN
80.0000 mg/m2 | Freq: Once | INTRAVENOUS | Status: AC
  Administered 2021-07-14: 168 mg via INTRAVENOUS
  Filled 2021-07-14: qty 28

## 2021-07-14 MED ORDER — FAMOTIDINE 20 MG IN NS 100 ML IVPB
20.0000 mg | Freq: Once | INTRAVENOUS | Status: AC
  Administered 2021-07-14: 20 mg via INTRAVENOUS
  Filled 2021-07-14: qty 20
  Filled 2021-07-14: qty 100

## 2021-07-14 MED ORDER — SODIUM CHLORIDE 0.9 % IV SOLN
Freq: Once | INTRAVENOUS | Status: AC
  Filled 2021-07-14: qty 250

## 2021-07-14 MED ORDER — SODIUM CHLORIDE 0.9 % IV SOLN
10.0000 mg | Freq: Once | INTRAVENOUS | Status: AC
  Administered 2021-07-14: 10 mg via INTRAVENOUS
  Filled 2021-07-14: qty 10

## 2021-07-14 MED ORDER — DIPHENHYDRAMINE HCL 50 MG/ML IJ SOLN
50.0000 mg | Freq: Once | INTRAMUSCULAR | Status: AC
  Administered 2021-07-14: 50 mg via INTRAVENOUS
  Filled 2021-07-14: qty 1

## 2021-07-14 MED ORDER — HEPARIN SOD (PORK) LOCK FLUSH 100 UNIT/ML IV SOLN
INTRAVENOUS | Status: AC
  Filled 2021-07-14: qty 5

## 2021-07-14 MED ORDER — SODIUM CHLORIDE 0.9% FLUSH
10.0000 mL | Freq: Once | INTRAVENOUS | Status: AC
  Administered 2021-07-14: 10 mL via INTRAVENOUS
  Filled 2021-07-14: qty 10

## 2021-07-14 MED ORDER — HEPARIN SOD (PORK) LOCK FLUSH 100 UNIT/ML IV SOLN
500.0000 [IU] | Freq: Once | INTRAVENOUS | Status: AC
  Administered 2021-07-14: 500 [IU] via INTRAVENOUS
  Filled 2021-07-14: qty 5

## 2021-07-14 NOTE — Patient Instructions (Addendum)
C[facility]ER CENTER [place]  Discharge Instructions: Thank you for choosing [facility] to provide your oncology and hematology care.  If you have a lab appointment with the Cancer Center, please go directly to the Cancer Center and check in at the registration area.  Wear comfortable clothing and clothing appropriate for easy access to any Portacath or PICC line.   We strive to give you quality time with your provider. You may need to reschedule your appointment if you arrive late (15 or more minutes).  Arriving late affects you and other patients whose appointments are after yours.  Also, if you miss three or more appointments without notifying the office, you may be dismissed from the clinic at the provider's discretion.      For prescription refill requests, have your pharmacy contact our office and allow 72 hours for refills to be completed.    Today you received the following chemotherapy and/or immunotherapy agents Taxol and Carboplatin       To help prevent nausea and vomiting after your treatment, we encourage you to take your nausea medication as directed.  BELOW ARE SYMPTOMS THAT SHOULD BE REPORTED IMMEDIATELY: *FEVER GREATER THAN 100.4 F (38 C) OR HIGHER *CHILLS OR SWEATING *NAUSEA AND VOMITING THAT IS NOT CONTROLLED WITH YOUR NAUSEA MEDICATION *UNUSUAL SHORTNESS OF BREATH *UNUSUAL BRUISING OR BLEEDING *URINARY PROBLEMS (pain or burning when urinating, or frequent urination) *BOWEL PROBLEMS (unusual diarrhea, constipation, pain near the anus) TENDERNESS IN MOUTH AND THROAT WITH OR WITHOUT PRESENCE OF ULCERS (sore throat, sores in mouth, or a toothache) UNUSUAL RASH, SWELLING OR PAIN  UNUSUAL VAGINAL DISCHARGE OR ITCHING   Items with * indicate a potential emergency and should be followed up as soon as possible or go to the Emergency Department if any problems should occur.  Please show the CHEMOTHERAPY ALERT CARD or IMMUNOTHERAPY ALERT CARD  at check-in to the Emergency Department and triage nurse.  Should you have questions after your visit or need to cancel or reschedule your appointment, please contact CANCER CENTER  [facility]  [phone] and follow the prompts.  Office hours are 8:00 a.m. to 4:30 p.m. Monday - Friday. Please note that voicemails left after 4:00 p.m. may not be returned until the following business day.  We are closed weekends and major holidays. You have access to a nurse at all times for urgent questions. Please call the main number to the clinic [phone] and follow the prompts.  For any non-urgent questions, you may also contact your provider using MyChart. We now offer e-Visits for anyone 18 and older to request care online for non-urgent symptoms. For details visit [email or web address].   Also download the MyChart app! Go to the app store, search "MyChart", open the app, select [facility], and log in with your MyChart username and password.  Due to Covid, a mask is required upon entering the hospital/clinic. If you do not have a mask, one will be given to you upon arrival. For doctor visits, patients may have 1 support person aged 18 or older with them. For treatment visits, patients cannot have anyone with them due to current Covid guidelines and our immunocompromised population.  

## 2021-07-14 NOTE — Progress Notes (Signed)
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  female with PMH listed below was seen in consultation at the request of  Renee Rival, NP  for evaluation of triple negative breast cancer.  03/30/2021, screening mammogram with 3D 2 nodular areas of asymmetric density demonstrated within the superior lateral aspect of the left breast middle third depth.  Finding was best appreciated on 3D tomosynthesis imaging. Targeted ultrasound showed left upper outer breast 1:30 position 1.1 cm round hypoechoic mass with angular margins. 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 Bary Castilla yesterday.  She presents to establish care with me today Patient did not notice this left mass prior to the mammogram.   Case was discussed on Tumor board.  Pathology reports and mammogram/ultrasound were done at outside facility and results were reviewed and discussed with patient and her husband.  LN status was not mentioned on her [place]. Recommend additional images with mammogram and MRI, neoadjuvant chemotherapy  Family history of breast cancer: Breast cancer in her sister, her late 22s, early 28s; and being paternal grandmother Menarche: 23 Age at first live childbirth: 44  patient has 2 sons and 1 daughter.  Daughter has passed away Used OCP:  Used estrogen and progesterone therapy: Denies History of Radiation to the chest: Denies Previous of breast biopsy: Denies  /25/2022 diagnostic mammogram of left breast showed 2.1 cm biopsy-proven  malignancy in the lateral left breast at 2:00, 4 cm from nipple.  Directly adjacent cystic mass, 1.5 cm in size, consistent with biopsy hematoma.  1 abnormal and 1 borderline abnormal left axillary lymph node  05/18/2021 bilateral breast MRI with and without contrast showed 2.5 x 3 x 2.5 cm hematoma containing biopsy clip artifact within the upper outer left breast.  1.6 cm nodular non-mass-like enhancement 2.5 cm posterior/superior to the biopsy clip may represent biopsy-proven malignancy or additional areas of malignancy.  Single abnormal appearing left axillary lymph node with eccentric cortical thickening.  No MRI evidence of the right breast malignancy.  #05/07/2021, Mediport was placed by Dr. Bary Castilla #05/14/2021 echocardiogram showed LVEF 60-55%  05/20/2021 Slide consultation of her left breast biopsy from outside institution - invasive mammary carcinoma, no special type, grade 3, DCIS and LVI not identified. ER negative, PR negative, HER2 IHC negative. Ki 67 70-80%    05/20/2021 ultrasound-guided left axillary lymph node was negative for malignancy.  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 D15- not done due to cytopenia  INTERVAL HISTORY Peggy Bates is a 55 y.o. female who has above history reviewed by me today presents for follow up visit for management of left triple negative breast cancer Problems and complaints are listed below: Patient is on neoadjuvant chemotherapy Overall she tolerates well except cytopenia. Today she reports feeling well.  Was accompanied by a female friend.  No nausea vomiting diarrhea.  She has gained 4 pounds since last visit. Patient reports that she "swells  up" in the morning   Review of Systems  Constitutional:  Negative for appetite change, chills, fatigue and fever.   HENT:   Negative for hearing loss and voice change.   Eyes:  Negative for eye problems.  Respiratory:  Negative for chest tightness and cough.   Cardiovascular:  Negative for chest pain.  Gastrointestinal:  Negative for abdominal distention, abdominal pain and blood in stool.  Endocrine: Negative for hot flashes.  Genitourinary:  Negative for difficulty urinating and frequency.   Musculoskeletal:  Negative for arthralgias.  Skin:  Negative for itching and rash.  Neurological:  Negative for extremity weakness.  Hematological:  Negative for adenopathy.  Psychiatric/Behavioral:  Negative for confusion.    MEDICAL HISTORY:  Past Medical History:  Diagnosis Date   Arthritis    knees   COVID-19 12/2020   Depression    Family history of breast cancer    Goiter    Malignant neoplasm of left female breast ([place]) 04/07/2021    SURGICAL HISTORY: Past Surgical History:  Procedure Laterality Date   BREAST BIOPSY Left 04/07/2021   [facility] in [place]:u/s bx triple neg   BREAST BIOPSY Left 05/20/2021   [place] Axilla Bx, hydro 3 marker, path pending    KNEE ARTHROSCOPY     PORTACATH PLACEMENT Right 05/07/2021   Procedure: INSERTION PORT-A-CATH;  Surgeon: Robert Bellow, MD;  Location: [facility] ORS;  Service: General;  Laterality: Right;   TOOTH EXTRACTION     TRANSCERVICAL UTERINE FIBROID(S) ABLATION  2010    SOCIAL HISTORY: Social History   Socioeconomic History   Marital status: Married    Spouse name: Not on file   Number of children: Not on file   Years of education: Not on file   Highest education level: Not on file  Occupational History   Not on file  Tobacco Use   Smoking status: Never   Smokeless tobacco: Never  Vaping Use   Vaping Use: Never used  Substance and Sexual Activity   Alcohol use: Never   Drug use: Never   Sexual activity: Yes  Other Topics Concern   Not on file  Social History Narrative   ** Merged History Encounter **       Social  Determinants of Health   Financial Resource Strain: Not on file  Food Insecurity: Not on file  Transportation Needs: Not on file  Physical Activity: Not on file  Stress: Not on file  Social Connections: Not on file  Intimate Partner Violence: Not on file    FAMILY HISTORY: Family History  Problem Relation Age of Onset   Breast cancer Paternal Grandmother    Breast cancer Sister        dx 67s, recurrence x3   Diabetes Sister    Diabetes Father    Throat cancer Maternal Grandmother     ALLERGIES:  has No Known Allergies.  MEDICATIONS:  Current Outpatient Medications  Medication Sig Dispense Refill   chlorhexidine (PERIDEX) 0.12 % solution Use as directed 10 mLs in the mouth or throat 2 (two) times daily. 473 mL 2   cholecalciferol (VITAMIN D) 25 MCG (1000 UNIT) tablet Take 1,000 Units by mouth daily.     dexamethasone (DECADRON) 4 MG tablet Take 2 tablets once a day for 3 days after carboplatin and AC chemotherapy. Take with food. 30 tablet 1   diclofenac Sodium (VOLTAREN) 1 % GEL Apply 1 application topically daily.     gabapentin (NEURONTIN) 600 MG tablet Take 600 mg  by mouth every morning.     Glucosamine-Chondroitin (MOVE FREE PO) Take 1 tablet by mouth daily.     levonorgestrel (MIRENA) 20 MCG/DAY IUD 1 each by Intrauterine route once.     lidocaine-prilocaine (EMLA) cream Apply to affected area once 30 g 3   meloxicam (MOBIC) 15 MG tablet Take 15 mg by mouth daily.     ondansetron (ZOFRAN) 8 MG tablet Take 1 tablet (8 mg total) by mouth 2 (two) times daily as needed. Start on the third day after carboplatin and AC chemotherapy. (Patient not taking: No sig reported) 30 tablet 1   prochlorperazine (COMPAZINE) 10 MG tablet Take 1 tablet (10 mg total) by mouth every 6 (six) hours as needed (Nausea or vomiting). (Patient not taking: No sig reported) 30 tablet 1   No current facility-administered medications for this visit.   Facility-Administered Medications Ordered in Other  Visits  Medication Dose Route Frequency Provider Last Rate Last Admin   CARBOplatin (PARAPLATIN) 300 mg in sodium chloride 0.9 % 250 mL chemo infusion  300 mg Intravenous Once Earlie Server, MD 560 mL/hr at 07/14/21 1248 300 mg at 07/14/21 1248   heparin lock flush 100 unit/mL  500 Units Intravenous Once Earlie Server, MD         PHYSICAL EXAMINATION: ECOG PERFORMANCE STATUS: 0 - Asymptomatic Vitals:   07/14/21 0921  BP: 132/88  Pulse: 78  Resp: 18  Temp: 99 F (37.2 C)  SpO2: 98%   Filed Weights   07/14/21 0921  Weight: 232 lb (105.2 kg)    Physical Exam Constitutional:      General: She is not in acute distress. HENT:     Head: Normocephalic and atraumatic.  Eyes:     General: No scleral icterus. Cardiovascular:     Rate and Rhythm: Normal rate and regular rhythm.     Heart sounds: Normal heart sounds.  Pulmonary:     Effort: Pulmonary effort is normal. No respiratory distress.     Breath sounds: No wheezing.  Abdominal:     General: Bowel sounds are normal. There is no distension.     Palpations: Abdomen is soft.  Musculoskeletal:        General: No deformity. Normal range of motion.     Cervical back: Normal range of motion and neck supple.  Skin:    General: Skin is warm and dry.     Findings: No erythema or rash.  Neurological:     Mental Status: She is alert and oriented to person, place, and time. Mental status is at baseline.     Cranial Nerves: No cranial nerve deficit.     Coordination: Coordination normal.  Psychiatric:        Mood and Affect: Mood normal.     LABORATORY DATA:  I have reviewed the data as listed Lab Results  Component Value Date   WBC 5.1 07/14/2021   HGB 12.4 07/14/2021   HCT 38.9 07/14/2021   MCV 86.8 07/14/2021   PLT 460 (H) 07/14/2021   Recent Labs    06/30/21 0757 07/07/21 0811 07/14/21 0853  NA 137 136 135  K 4.2 4.1 4.5  CL 104 103 107  CO2 '25 26 24  ' GLUCOSE 92 106* 93  BUN '20 19 13  ' CREATININE 0.65 0.66 0.60   CALCIUM 9.2 8.9 9.0  GFRNONAA >60 >60 >60  PROT 7.6 7.4 7.4  ALBUMIN 4.3 4.1 4.2  AST 43* 22 25  ALT 76* 32 30  ALKPHOS 91  98 73  BILITOT 0.6 0.5 0.3    Iron/TIBC/Ferritin/ %Sat No results found for: IRON, TIBC, FERRITIN, IRONPCTSAT    RADIOGRAPHIC STUDIES: I have personally reviewed the radiological images as listed and agreed with the findings in the report. No results found.    ASSESSMENT & PLAN:  1. Encounter for antineoplastic chemotherapy   2. Carcinoma of upper-outer quadrant of left breast in female, estrogen receptor negative ([place])   3. Transaminitis   4. Chemotherapy-induced neutropenia ([place])   .Cancer Staging Carcinoma of upper-outer quadrant of left breast in female, estrogen receptor negative ([place]) Staging form: Breast, AJCC 8th Edition - Clinical stage from 04/29/2021: Stage IIB (cT2, cN0, cM0, G3, ER-, PR-, HER2-) - Signed by Earlie Server, MD on 06/04/2021   #Left breast invasive mammary carcinoma, triple negative, + high-grade DCIS ER positive cT2 N0, stage IIB Labs reviewed and discussed with patient She appears tolerating weekly carboplatin AUC 2/Taxol better Proceed with cycle 2-day 15 chemotherapy treatment  day 16 and day 17 Zarxio for G-CSF support  #Therapy induced neutropenia, resolved.  Continue close monitor.  Patient will receive short acting G-CSF support. Peridex solution swish and spit BID for prophylaxis.  #Transaminitis, resolved.  Supportive care measures are necessary for patient well-being and will be provided as necessary. We spent sufficient time to discuss many aspect of care, questions were answered to patient's satisfaction.  All questions were answered. The patient knows to call the clinic with any problems questions or concerns.  cc Renee Rival, NP    Return of visit: 1 week, lab MD pembrolizumab Norma Fredrickson AUC 2 with Taxol    Earlie Server, MD, [name] Hematology Oncology [facility] Pager-  [phone] 07/14/2021

## 2021-07-14 NOTE — Telephone Encounter (Signed)
Cancer claim form faxed to [employer]. Fax confirmation received.

## 2021-07-14 NOTE — Progress Notes (Signed)
Pt in for follow up, reports "gets winded a lot more".  Pt also reports some tingling in right big toe.

## 2021-07-15 ENCOUNTER — Inpatient Hospital Stay

## 2021-07-15 DIAGNOSIS — Z5112 Encounter for antineoplastic immunotherapy: Secondary | ICD-10-CM | POA: Diagnosis not present

## 2021-07-15 DIAGNOSIS — C50412 Malignant neoplasm of upper-outer quadrant of left female breast: Secondary | ICD-10-CM

## 2021-07-15 MED ORDER — FILGRASTIM-SNDZ 480 MCG/0.8ML IJ SOSY
480.0000 ug | PREFILLED_SYRINGE | Freq: Once | INTRAMUSCULAR | Status: AC
  Administered 2021-07-15: 480 ug via SUBCUTANEOUS
  Filled 2021-07-15: qty 0.8

## 2021-07-16 ENCOUNTER — Inpatient Hospital Stay

## 2021-07-16 DIAGNOSIS — C50412 Malignant neoplasm of upper-outer quadrant of left female breast: Secondary | ICD-10-CM

## 2021-07-16 DIAGNOSIS — Z171 Estrogen receptor negative status [ER-]: Secondary | ICD-10-CM

## 2021-07-16 DIAGNOSIS — Z5112 Encounter for antineoplastic immunotherapy: Secondary | ICD-10-CM | POA: Diagnosis not present

## 2021-07-16 MED ORDER — FILGRASTIM-SNDZ 480 MCG/0.8ML IJ SOSY
480.0000 ug | PREFILLED_SYRINGE | Freq: Once | INTRAMUSCULAR | Status: AC
  Administered 2021-07-16: 480 ug via SUBCUTANEOUS
  Filled 2021-07-16: qty 0.8

## 2021-07-21 ENCOUNTER — Inpatient Hospital Stay (HOSPITAL_BASED_OUTPATIENT_CLINIC_OR_DEPARTMENT_OTHER): Admitting: Oncology

## 2021-07-21 ENCOUNTER — Ambulatory Visit: Admitting: Oncology

## 2021-07-21 ENCOUNTER — Inpatient Hospital Stay

## 2021-07-21 ENCOUNTER — Encounter: Payer: Self-pay | Admitting: Oncology

## 2021-07-21 ENCOUNTER — Other Ambulatory Visit

## 2021-07-21 ENCOUNTER — Ambulatory Visit

## 2021-07-21 ENCOUNTER — Inpatient Hospital Stay: Attending: Internal Medicine

## 2021-07-21 VITALS — BP 136/89 | HR 73 | Temp 96.4°F | Resp 18 | Wt 230.0 lb

## 2021-07-21 DIAGNOSIS — Z801 Family history of malignant neoplasm of trachea, bronchus and lung: Secondary | ICD-10-CM

## 2021-07-21 DIAGNOSIS — Z171 Estrogen receptor negative status [ER-]: Secondary | ICD-10-CM | POA: Insufficient documentation

## 2021-07-21 DIAGNOSIS — Z5111 Encounter for antineoplastic chemotherapy: Secondary | ICD-10-CM

## 2021-07-21 DIAGNOSIS — Z5112 Encounter for antineoplastic immunotherapy: Secondary | ICD-10-CM | POA: Insufficient documentation

## 2021-07-21 DIAGNOSIS — R7401 Elevation of levels of liver transaminase levels: Secondary | ICD-10-CM

## 2021-07-21 DIAGNOSIS — Z803 Family history of malignant neoplasm of breast: Secondary | ICD-10-CM

## 2021-07-21 DIAGNOSIS — C50412 Malignant neoplasm of upper-outer quadrant of left female breast: Secondary | ICD-10-CM | POA: Diagnosis present

## 2021-07-21 DIAGNOSIS — D701 Agranulocytosis secondary to cancer chemotherapy: Secondary | ICD-10-CM

## 2021-07-21 DIAGNOSIS — Z95828 Presence of other vascular implants and grafts: Secondary | ICD-10-CM

## 2021-07-21 DIAGNOSIS — Z5189 Encounter for other specified aftercare: Secondary | ICD-10-CM | POA: Diagnosis not present

## 2021-07-21 LAB — CBC WITH DIFFERENTIAL/PLATELET
Abs Immature Granulocytes: 0.01 10*3/uL (ref 0.00–0.07)
Basophils Absolute: 0 10*3/uL (ref 0.0–0.1)
Basophils Relative: 1 %
Eosinophils Absolute: 0 10*3/uL (ref 0.0–0.5)
Eosinophils Relative: 0 %
HCT: 38.6 % (ref 36.0–46.0)
Hemoglobin: 12.3 g/dL (ref 12.0–15.0)
Immature Granulocytes: 0 %
Lymphocytes Relative: 38 %
Lymphs Abs: 1.3 10*3/uL (ref 0.7–4.0)
MCH: 27.5 pg (ref 26.0–34.0)
MCHC: 31.9 g/dL (ref 30.0–36.0)
MCV: 86.2 fL (ref 80.0–100.0)
Monocytes Absolute: 0.4 10*3/uL (ref 0.1–1.0)
Monocytes Relative: 12 %
Neutro Abs: 1.6 10*3/uL — ABNORMAL LOW (ref 1.7–7.7)
Neutrophils Relative %: 49 %
Platelets: 230 10*3/uL (ref 150–400)
RBC: 4.48 MIL/uL (ref 3.87–5.11)
RDW: 13.8 % (ref 11.5–15.5)
Smear Review: NORMAL
WBC: 3.3 10*3/uL — ABNORMAL LOW (ref 4.0–10.5)
nRBC: 0 % (ref 0.0–0.2)

## 2021-07-21 LAB — COMPREHENSIVE METABOLIC PANEL
ALT: 39 U/L (ref 0–44)
AST: 30 U/L (ref 15–41)
Albumin: 4.1 g/dL (ref 3.5–5.0)
Alkaline Phosphatase: 84 U/L (ref 38–126)
Anion gap: 4 — ABNORMAL LOW (ref 5–15)
BUN: 19 mg/dL (ref 6–20)
CO2: 26 mmol/L (ref 22–32)
Calcium: 9 mg/dL (ref 8.9–10.3)
Chloride: 105 mmol/L (ref 98–111)
Creatinine, Ser: 0.68 mg/dL (ref 0.44–1.00)
GFR, Estimated: >60 mL/min (ref >60)
Glucose, Bld: 97 mg/dL (ref 70–99)
Potassium: 4.3 mmol/L (ref 3.5–5.1)
Sodium: 135 mmol/L (ref 135–145)
Total Bilirubin: 0.7 mg/dL (ref 0.3–1.2)
Total Protein: 7.4 g/dL (ref 6.5–8.1)

## 2021-07-21 MED ORDER — HEPARIN SOD (PORK) LOCK FLUSH 100 UNIT/ML IV SOLN
INTRAVENOUS | Status: AC
  Filled 2021-07-21: qty 5

## 2021-07-21 MED ORDER — SODIUM CHLORIDE 0.9 % IV SOLN
Freq: Once | INTRAVENOUS | Status: AC
  Filled 2021-07-21: qty 250

## 2021-07-21 MED ORDER — SODIUM CHLORIDE 0.9 % IV SOLN
150.0000 mg | Freq: Once | INTRAVENOUS | Status: AC
  Administered 2021-07-21: 150 mg via INTRAVENOUS
  Filled 2021-07-21: qty 150

## 2021-07-21 MED ORDER — SODIUM CHLORIDE 0.9 % IV SOLN
200.0000 mg | Freq: Once | INTRAVENOUS | Status: AC
  Administered 2021-07-21: 200 mg via INTRAVENOUS
  Filled 2021-07-21: qty 8

## 2021-07-21 MED ORDER — CARBOPLATIN CHEMO INJECTION 450 MG/45ML
300.0000 mg | Freq: Once | INTRAVENOUS | Status: AC
  Administered 2021-07-21: 300 mg via INTRAVENOUS
  Filled 2021-07-21: qty 30

## 2021-07-21 MED ORDER — PALONOSETRON HCL INJECTION 0.25 MG/5ML
0.2500 mg | Freq: Once | INTRAVENOUS | Status: AC
  Administered 2021-07-21: 0.25 mg via INTRAVENOUS
  Filled 2021-07-21: qty 5

## 2021-07-21 MED ORDER — FAMOTIDINE 20 MG IN NS 100 ML IVPB
20.0000 mg | Freq: Once | INTRAVENOUS | Status: AC
  Administered 2021-07-21: 20 mg via INTRAVENOUS
  Filled 2021-07-21: qty 20

## 2021-07-21 MED ORDER — SODIUM CHLORIDE 0.9 % IV SOLN
10.0000 mg | Freq: Once | INTRAVENOUS | Status: AC
  Administered 2021-07-21: 10 mg via INTRAVENOUS
  Filled 2021-07-21: qty 10

## 2021-07-21 MED ORDER — SODIUM CHLORIDE 0.9 % IV SOLN
80.0000 mg/m2 | Freq: Once | INTRAVENOUS | Status: AC
  Administered 2021-07-21: 168 mg via INTRAVENOUS
  Filled 2021-07-21: qty 28

## 2021-07-21 MED ORDER — DIPHENHYDRAMINE HCL 50 MG/ML IJ SOLN
50.0000 mg | Freq: Once | INTRAMUSCULAR | Status: AC
  Administered 2021-07-21: 50 mg via INTRAVENOUS
  Filled 2021-07-21: qty 1

## 2021-07-21 MED ORDER — HEPARIN SOD (PORK) LOCK FLUSH 100 UNIT/ML IV SOLN
500.0000 [IU] | Freq: Once | INTRAVENOUS | Status: AC
  Administered 2021-07-21: 500 [IU] via INTRAVENOUS
  Filled 2021-07-21: qty 5

## 2021-07-21 NOTE — Progress Notes (Signed)
Patient here for oncology follow-up appointment, question about drinking electrolyte solution

## 2021-07-21 NOTE — Patient Instructions (Addendum)
[facility]  Discharge Instructions: Thank you for choosing [facility] to provide your oncology and hematology care.  If you have a lab appointment with the [facility], please go directly to the [facility] and check in at the registration area.  Wear comfortable clothing and clothing appropriate for easy access to any Portacath or PICC line.   We strive to give you quality time with your provider. You may need to reschedule your appointment if you arrive late (15 or more minutes).  Arriving late affects you and other patients whose appointments are after yours.  Also, if you miss three or more appointments without notifying the office, you may be dismissed from the clinic at the provider's discretion.      For prescription refill requests, have your pharmacy contact our office and allow 72 hours for refills to be completed.    Today you received the following chemotherapy and/or immunotherapy agents keytruda, taxol, carboplatin      To help prevent nausea and vomiting after your treatment, we encourage you to take your nausea medication as directed.  BELOW ARE SYMPTOMS THAT SHOULD BE REPORTED IMMEDIATELY: *FEVER GREATER THAN 100.4 F (38 C) OR HIGHER *CHILLS OR SWEATING *NAUSEA AND VOMITING THAT IS NOT CONTROLLED WITH YOUR NAUSEA MEDICATION *UNUSUAL SHORTNESS OF BREATH *UNUSUAL BRUISING OR BLEEDING *URINARY PROBLEMS (pain or burning when urinating, or frequent urination) *BOWEL PROBLEMS (unusual diarrhea, constipation, pain near the anus) TENDERNESS IN MOUTH AND THROAT WITH OR WITHOUT PRESENCE OF ULCERS (sore throat, sores in mouth, or a toothache) UNUSUAL RASH, SWELLING OR PAIN  UNUSUAL VAGINAL DISCHARGE OR ITCHING   Items with * indicate a potential emergency and should be followed up as soon as possible or go to the Emergency Department if any problems should occur.  Please show the CHEMOTHERAPY ALERT CARD or IMMUNOTHERAPY ALERT  CARD at check-in to the Emergency Department and triage nurse.  Should you have questions after your visit or need to cancel or reschedule your appointment, please contact [facility]  [phone] and follow the prompts.  Office hours are 8:00 a.m. to 4:30 p.m. Monday - Friday. Please note that voicemails left after 4:00 p.m. may not be returned until the following business day.  We are closed weekends and major holidays. You have access to a nurse at all times for urgent questions. Please call the main number to the clinic [phone] and follow the prompts.  For any non-urgent questions, you may also contact your provider using MyChart. We now offer e-Visits for anyone 63 and older to request care online for non-urgent symptoms. For details visit [email or web address].   Also download the MyChart app! Go to the app store, search "MyChart", open the app, select [facility], and log in with your MyChart username and password.  Due to Covid, a mask is required upon entering the hospital/clinic. If you do not have a mask, one will be given to you upon arrival. For doctor visits, patients may have 1 support person aged 3 or older with them. For treatment visits, patients cannot have anyone with them due to current Covid guidelines and our immunocompromised population.  Pembrolizumab injection What is this medication? PEMBROLIZUMAB (pem broe liz ue mab) is a monoclonal antibody. It is used totreat certain types of cancer. This medicine may be used for other purposes; ask your health care provider orpharmacist if you have questions. COMMON BRAND NAME(S): Keytruda What should I tell my care team before  I take this medication? They need to know if you have any of these conditions: autoimmune diseases like Crohn's disease, ulcerative colitis, or lupus have had or planning to have an allogeneic stem cell transplant (uses someone else's stem cells) history of organ  transplant history of chest radiation nervous system problems like myasthenia gravis or Guillain-Barre syndrome an unusual or allergic reaction to pembrolizumab, other medicines, foods, dyes, or preservatives pregnant or trying to get pregnant breast-feeding How should I use this medication? This medicine is for infusion into a vein. It is given by a health careprofessional in a hospital or clinic setting. A special MedGuide will be given to you before each treatment. Be sure to readthis information carefully each time. Talk to your pediatrician regarding the use of this medicine in children. While this drug may be prescribed for children as young as 6 months for selectedconditions, precautions do apply. Overdosage: If you think you have taken too much of this medicine contact apoison control center or emergency room at once. NOTE: This medicine is only for you. Do not share this medicine with others. What if I miss a dose? It is important not to miss your dose. Call your doctor or health careprofessional if you are unable to keep an appointment. What may interact with this medication? Interactions have not been studied. This list may not describe all possible interactions. Give your health care provider a list of all the medicines, herbs, non-prescription drugs, or dietary supplements you use. Also tell them if you smoke, drink alcohol, or use illegaldrugs. Some items may interact with your medicine. What should I watch for while using this medication? Your condition will be monitored carefully while you are receiving thismedicine. You may need blood work done while you are taking this medicine. Do not become pregnant while taking this medicine or for 4 months after stopping it. Women should inform their doctor if they wish to become pregnant or think they might be pregnant. There is a potential for serious side effects to an unborn child. Talk to your health care professional or pharmacist for  more information. Do not breast-feed an infant while taking this medicine orfor 4 months after the last dose. What side effects may I notice from receiving this medication? Side effects that you should report to your doctor or health care professionalas soon as possible: allergic reactions like skin rash, itching or hives, swelling of the face, lips, or tongue bloody or black, tarry breathing problems changes in vision chest pain chills confusion constipation cough diarrhea dizziness or feeling faint or lightheaded fast or irregular heartbeat fever flushing joint pain low blood counts - this medicine may decrease the number of white blood cells, red blood cells and platelets. You may be at increased risk for infections and bleeding. muscle pain muscle weakness pain, tingling, numbness in the hands or feet persistent headache redness, blistering, peeling or loosening of the skin, including inside the mouth signs and symptoms of high blood sugar such as dizziness; dry mouth; dry skin; fruity breath; nausea; stomach pain; increased hunger or thirst; increased urination signs and symptoms of kidney injury like trouble passing urine or change in the amount of urine signs and symptoms of liver injury like dark urine, light-colored stools, loss of appetite, nausea, right upper belly pain, yellowing of the eyes or skin sweating swollen lymph nodes weight loss Side effects that usually do not require medical attention (report to yourdoctor or health care professional if they continue or are bothersome): decreased appetite  hair loss tiredness This list may not describe all possible side effects. Call your doctor for medical advice about side effects. You may report side effects to FDA at1-800-FDA-1088. Where should I keep my medication? This drug is given in a hospital or clinic and will not be stored at home. NOTE: This sheet is a summary. It may not cover all possible information. If you  have questions about this medicine, talk to your doctor, pharmacist, orhealth care provider.  2022 Elsevier/Gold Standard (2019-11-06 21:44:53)  Paclitaxel injection What is this medication? PACLITAXEL (PAK li TAX el) is a chemotherapy drug. It targets fast dividing cells, like cancer cells, and causes these cells to die. This medicine is used to treat ovarian cancer, breast cancer, lung cancer, Kaposi's sarcoma, andother cancers. This medicine may be used for other purposes; ask your health care provider orpharmacist if you have questions. COMMON BRAND NAME(S): Onxol, Taxol What should I tell my care team before I take this medication? They need to know if you have any of these conditions: history of irregular heartbeat liver disease low blood counts, like low white cell, platelet, or red cell counts lung or breathing disease, like asthma tingling of the fingers or toes, or other nerve disorder an unusual or allergic reaction to paclitaxel, alcohol, polyoxyethylated castor oil, other chemotherapy, other medicines, foods, dyes, or preservatives pregnant or trying to get pregnant breast-feeding How should I use this medication? This drug is given as an infusion into a vein. It is administered in a hospitalor clinic by a specially trained health care professional. Talk to your pediatrician regarding the use of this medicine in children.Special care may be needed. Overdosage: If you think you have taken too much of this medicine contact apoison control center or emergency room at once. NOTE: This medicine is only for you. Do not share this medicine with others. What if I miss a dose? It is important not to miss your dose. Call your doctor or health careprofessional if you are unable to keep an appointment. What may interact with this medication? Do not take this medicine with any of the following medications: live virus vaccines This medicine may also interact with the following  medications: antiviral medicines for hepatitis, HIV or AIDS certain antibiotics like erythromycin and clarithromycin certain medicines for fungal infections like ketoconazole and itraconazole certain medicines for seizures like carbamazepine, phenobarbital, phenytoin gemfibrozil nefazodone rifampin St. John's wort This list may not describe all possible interactions. Give your health care provider a list of all the medicines, herbs, non-prescription drugs, or dietary supplements you use. Also tell them if you smoke, drink alcohol, or use illegaldrugs. Some items may interact with your medicine. What should I watch for while using this medication? Your condition will be monitored carefully while you are receiving this medicine. You will need important blood work done while you are taking thismedicine. This medicine can cause serious allergic reactions. To reduce your risk you will need to take other medicine(s) before treatment with this medicine. If you experience allergic reactions like skin rash, itching or hives, swelling of theface, lips, or tongue, tell your doctor or health care professional right away. In some cases, you may be given additional medicines to help with side effects.Follow all directions for their use. This drug may make you feel generally unwell. This is not uncommon, as chemotherapy can affect healthy cells as well as cancer cells. Report any side effects. Continue your course of treatment even though you feel ill unless yourdoctor tells you  to stop. Call your doctor or health care professional for advice if you get a fever, chills or sore throat, or other symptoms of a cold or flu. Do not treat yourself. This drug decreases your body's ability to fight infections. Try toavoid being around people who are sick. This medicine may increase your risk to bruise or bleed. Call your doctor orhealth care professional if you notice any unusual bleeding. Be careful brushing and flossing  your teeth or using a toothpick because you may get an infection or bleed more easily. If you have any dental work done,tell your dentist you are receiving this medicine. Avoid taking products that contain aspirin, acetaminophen, ibuprofen, naproxen, or ketoprofen unless instructed by your doctor. These medicines may hide afever. Do not become pregnant while taking this medicine. Women should inform their doctor if they wish to become pregnant or think they might be pregnant. There is a potential for serious side effects to an unborn child. Talk to your health care professional or pharmacist for more information. Do not breast-feed aninfant while taking this medicine. Men are advised not to father a child while receiving this medicine. This product may contain alcohol. Ask your pharmacist or healthcare provider if this medicine contains alcohol. Be sure to tell all healthcare providers you are taking this medicine. Certain medicines, like metronidazole and disulfiram, can cause an unpleasant reaction when taken with alcohol. The reaction includes flushing, headache, nausea, vomiting, sweating, and increased thirst. Thereaction can last from 30 minutes to several hours. What side effects may I notice from receiving this medication? Side effects that you should report to your doctor or health care professionalas soon as possible: allergic reactions like skin rash, itching or hives, swelling of the face, lips, or tongue breathing problems changes in vision fast, irregular heartbeat high or low blood pressure mouth sores pain, tingling, numbness in the hands or feet signs of decreased platelets or bleeding - bruising, pinpoint red spots on the skin, black, tarry stools, blood in the urine signs of decreased red blood cells - unusually weak or tired, feeling faint or lightheaded, falls signs of infection - fever or chills, cough, sore throat, pain or difficulty passing urine signs and symptoms of liver  injury like dark yellow or brown urine; general ill feeling or flu-like symptoms; light-colored stools; loss of appetite; nausea; right upper belly pain; unusually weak or tired; yellowing of the eyes or skin swelling of the ankles, feet, hands unusually slow heartbeat Side effects that usually do not require medical attention (report to yourdoctor or health care professional if they continue or are bothersome): diarrhea hair loss loss of appetite muscle or joint pain nausea, vomiting pain, redness, or irritation at site where injected tiredness This list may not describe all possible side effects. Call your doctor for medical advice about side effects. You may report side effects to FDA at1-800-FDA-1088. Where should I keep my medication? This drug is given in a hospital or clinic and will not be stored at home. NOTE: This sheet is a summary. It may not cover all possible information. If you have questions about this medicine, talk to your doctor, pharmacist, orhealth care provider.  2022 Elsevier/Gold Standard (2019-11-06 13:37:23)   Carboplatin injection What is this medication? CARBOPLATIN (KAR boe pla tin) is a chemotherapy drug. It targets fast dividing cells, like cancer cells, and causes these cells to die. This medicine is usedto treat ovarian cancer and many other cancers. This medicine may be used for other purposes; ask your  health care provider orpharmacist if you have questions. COMMON BRAND NAME(S): Paraplatin What should I tell my care team before I take this medication? They need to know if you have any of these conditions: blood disorders hearing problems kidney disease recent or ongoing radiation therapy an unusual or allergic reaction to carboplatin, cisplatin, other chemotherapy, other medicines, foods, dyes, or preservatives pregnant or trying to get pregnant breast-feeding How should I use this medication? This drug is usually given as an infusion into a vein.  It is administered in [facility] or clinic by a specially trained health care professional. Talk to your pediatrician regarding the use of this medicine in children.Special care may be needed. Overdosage: If you think you have taken too much of this medicine contact apoison control center or emergency room at once. NOTE: This medicine is only for you. Do not share this medicine with others. What if I miss a dose? It is important not to miss a dose. Call your doctor or health careprofessional if you are unable to keep an appointment. What may interact with this medication? medicines for seizures medicines to increase blood counts like filgrastim, pegfilgrastim, sargramostim some antibiotics like amikacin, gentamicin, neomycin, streptomycin, tobramycin vaccines Talk to your doctor or health care professional before taking any of thesemedicines: acetaminophen aspirin ibuprofen ketoprofen naproxen This list may not describe all possible interactions. Give your health care provider a list of all the medicines, herbs, non-prescription drugs, or dietary supplements you use. Also tell them if you smoke, drink alcohol, or use illegaldrugs. Some items may interact with your medicine. What should I watch for while using this medication? Your condition will be monitored carefully while you are receiving this medicine. You will need important blood work done while you are taking thismedicine. This drug may make you feel generally unwell. This is not uncommon, as chemotherapy can affect healthy cells as well as cancer cells. Report any side effects. Continue your course of treatment even though you feel ill unless yourdoctor tells you to stop. In some cases, you may be given additional medicines to help with side effects.Follow all directions for their use. Call your doctor or health care professional for advice if you get a fever, chills or sore throat, or other symptoms of a cold or flu. Do not treat  yourself. This drug decreases your body's ability to fight infections. Try toavoid being around people who are sick. This medicine may increase your risk to bruise or bleed. Call your doctor orhealth care professional if you notice any unusual bleeding. Be careful brushing and flossing your teeth or using a toothpick because you may get an infection or bleed more easily. If you have any dental work done,tell your dentist you are receiving this medicine. Avoid taking products that contain aspirin, acetaminophen, ibuprofen, naproxen, or ketoprofen unless instructed by your doctor. These medicines may hide afever. Do not become pregnant while taking this medicine. Women should inform their doctor if they wish to become pregnant or think they might be pregnant. There is a potential for serious side effects to an unborn child. Talk to your health care professional or pharmacist for more information. Do not breast-feed aninfant while taking this medicine. What side effects may I notice from receiving this medication? Side effects that you should report to your doctor or health care professionalas soon as possible: allergic reactions like skin rash, itching or hives, swelling of the face, lips, or tongue signs of infection - fever or chills, cough, sore throat, pain or  difficulty passing urine signs of decreased platelets or bleeding - bruising, pinpoint red spots on the skin, black, tarry stools, nosebleeds signs of decreased red blood cells - unusually weak or tired, fainting spells, lightheadedness breathing problems changes in hearing changes in vision chest pain high blood pressure low blood counts - This drug may decrease the number of white blood cells, red blood cells and platelets. You may be at increased risk for infections and bleeding. nausea and vomiting pain, swelling, redness or irritation at the injection site pain, tingling, numbness in the hands or feet problems with balance, talking,  walking trouble passing urine or change in the amount of urine Side effects that usually do not require medical attention (report to yourdoctor or health care professional if they continue or are bothersome): hair loss loss of appetite metallic taste in the mouth or changes in taste This list may not describe all possible side effects. Call your doctor for medical advice about side effects. You may report side effects to FDA at1-800-FDA-1088. Where should I keep my medication? This drug is given in a hospital or clinic and will not be stored at home. NOTE: This sheet is a summary. It may not cover all possible information. If you have questions about this medicine, talk to your doctor, pharmacist, orhealth care provider.  2022 Elsevier/Gold Standard (2008-03-11 14:38:05)

## 2021-07-21 NOTE — Progress Notes (Addendum)
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  female with PMH listed below was seen in consultation at the request of  Renee Rival, NP  for evaluation of triple negative breast cancer.  03/30/2021, screening mammogram with 3D 2 nodular areas of asymmetric density demonstrated within the superior lateral aspect of the left breast middle third depth.  Finding was best appreciated on 3D tomosynthesis imaging. Targeted ultrasound showed left upper outer breast 1:30 position 1.1 cm round hypoechoic mass with angular margins. 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 Bary Castilla yesterday.  She presents to establish care with me today Patient did not notice this left mass prior to the mammogram.   Case was discussed on Tumor board.  Pathology reports and mammogram/ultrasound were done at outside facility and results were reviewed and discussed with patient and her husband.  LN status was not mentioned on her [place]. Recommend additional images with mammogram and MRI, neoadjuvant chemotherapy  Family history of breast cancer: Breast cancer in her sister, her late 32s, early 17s; and being paternal grandmother Menarche: 41 Age at first live childbirth: 51  patient has 2 sons and 1 daughter.  Daughter has passed away Used OCP:  Used estrogen and progesterone therapy: Denies History of Radiation to the chest: Denies Previous of breast biopsy: Denies  /25/2022 diagnostic mammogram of left breast showed 2.1 cm biopsy-proven  malignancy in the lateral left breast at 2:00, 4 cm from nipple.  Directly adjacent cystic mass, 1.5 cm in size, consistent with biopsy hematoma.  1 abnormal and 1 borderline abnormal left axillary lymph node  05/18/2021 bilateral breast MRI with and without contrast showed 2.5 x 3 x 2.5 cm hematoma containing biopsy clip artifact within the upper outer left breast.  1.6 cm nodular non-mass-like enhancement 2.5 cm posterior/superior to the biopsy clip may represent biopsy-proven malignancy or additional areas of malignancy.  Single abnormal appearing left axillary lymph node with eccentric cortical thickening.  No MRI evidence of the right breast malignancy.  #05/07/2021, Mediport was placed by Dr. Bary Castilla #05/14/2021 echocardiogram showed LVEF 60-55%  05/20/2021 Slide consultation of her left breast biopsy from outside institution - invasive mammary carcinoma, no special type, grade 3, DCIS and LVI not identified. ER negative, PR negative, HER2 IHC negative. Ki 67 70-80%    05/20/2021 ultrasound-guided left axillary lymph node was negative for malignancy.  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 D15- not done due to cytopenia  INTERVAL HISTORY Peggy Bates is a 55 y.o. female who has above history reviewed by me today presents for follow up visit for management of left triple negative breast cancer Problems and complaints are listed below: Patient is on neoadjuvant chemotherapy Overall she tolerates well except cytopenia. She has no new complaints.  Feeling well. Accompanied by a friend.    Review of Systems  Constitutional:  Negative for appetite change, chills, fatigue and fever.  HENT:  Negative for hearing loss and voice change.   Eyes:  Negative for eye problems.  Respiratory:  Negative for chest  tightness and cough.   Cardiovascular:  Negative for chest pain.  Gastrointestinal:  Negative for abdominal distention, abdominal pain and blood in stool.  Endocrine: Negative for hot flashes.  Genitourinary:  Negative for difficulty urinating and frequency.   Musculoskeletal:  Negative for arthralgias.  Skin:  Negative for itching and rash.  Neurological:  Negative for extremity weakness.  Hematological:  Negative for adenopathy.  Psychiatric/Behavioral:  Negative for confusion.    MEDICAL HISTORY:  Past Medical History:  Diagnosis Date   Arthritis    knees   COVID-19 12/2020   Depression    Family history of breast cancer    Goiter    Malignant neoplasm of left female breast ([place]) 04/07/2021    SURGICAL HISTORY: Past Surgical History:  Procedure Laterality Date   BREAST BIOPSY Left 04/07/2021   [facility] in [place]:u/s bx triple neg   BREAST BIOPSY Left 05/20/2021   [place] Axilla Bx, hydro 3 marker, path pending    KNEE ARTHROSCOPY     PORTACATH PLACEMENT Right 05/07/2021   Procedure: INSERTION PORT-A-CATH;  Surgeon: Robert Bellow, MD;  Location: [facility] ORS;  Service: General;  Laterality: Right;   TOOTH EXTRACTION     TRANSCERVICAL UTERINE FIBROID(S) ABLATION  2010    SOCIAL HISTORY: Social History   Socioeconomic History   Marital status: Married    Spouse name: Not on file   Number of children: Not on file   Years of education: Not on file   Highest education level: Not on file  Occupational History   Not on file  Tobacco Use   Smoking status: Never   Smokeless tobacco: Never  Vaping Use   Vaping Use: Never used  Substance and Sexual Activity   Alcohol use: Never   Drug use: Never   Sexual activity: Yes  Other Topics Concern   Not on file  Social History Narrative   ** Merged History Encounter **       Social Determinants of Health   Financial Resource Strain: Not on file  Food Insecurity: Not on file  Transportation Needs: Not on  file  Physical Activity: Not on file  Stress: Not on file  Social Connections: Not on file  Intimate Partner Violence: Not on file    FAMILY HISTORY: Family History  Problem Relation Age of Onset   Breast cancer Paternal Grandmother    Breast cancer Sister        dx 37s, recurrence x3   Diabetes Sister    Diabetes Father    Throat cancer Maternal Grandmother     ALLERGIES:  has No Known Allergies.  MEDICATIONS:  Current Outpatient Medications  Medication Sig Dispense Refill   chlorhexidine (PERIDEX) 0.12 % solution Use as directed 10 mLs in the mouth or throat 2 (two) times daily. 473 mL 2   cholecalciferol (VITAMIN D) 25 MCG (1000 UNIT) tablet Take 1,000 Units by mouth daily.     dexamethasone (DECADRON) 4 MG tablet Take 2 tablets once a day for 3 days after carboplatin and AC chemotherapy. Take with food. 30 tablet 1   diclofenac Sodium (VOLTAREN) 1 % GEL Apply 1 application topically daily.     gabapentin (NEURONTIN) 600 MG tablet Take 600 mg by mouth every morning.     Glucosamine-Chondroitin (MOVE FREE PO) Take 1 tablet by mouth daily.     levonorgestrel (MIRENA)  20 MCG/DAY IUD 1 each by Intrauterine route once.     lidocaine-prilocaine (EMLA) cream Apply to affected area once 30 g 3   meloxicam (MOBIC) 15 MG tablet Take 15 mg by mouth daily.     phentermine (ADIPEX-P) 37.5 MG tablet 1 tablet     ondansetron (ZOFRAN) 8 MG tablet Take 1 tablet (8 mg total) by mouth 2 (two) times daily as needed. Start on the third day after carboplatin and AC chemotherapy. (Patient not taking: No sig reported) 30 tablet 1   prochlorperazine (COMPAZINE) 10 MG tablet Take 1 tablet (10 mg total) by mouth every 6 (six) hours as needed (Nausea or vomiting). (Patient not taking: No sig reported) 30 tablet 1   No current facility-administered medications for this visit.     PHYSICAL EXAMINATION: ECOG PERFORMANCE STATUS: 0 - Asymptomatic Vitals:   07/21/21 0934  BP: 136/89  Pulse: 73  Resp:  18  Temp: (!) 96.4 F (35.8 C)  SpO2: 100%   Filed Weights   07/21/21 0934  Weight: 230 lb (104.3 kg)    Physical Exam Constitutional:      General: She is not in acute distress. HENT:     Head: Normocephalic and atraumatic.  Eyes:     General: No scleral icterus. Cardiovascular:     Rate and Rhythm: Normal rate and regular rhythm.     Heart sounds: Normal heart sounds.  Pulmonary:     Effort: Pulmonary effort is normal. No respiratory distress.     Breath sounds: No wheezing.  Abdominal:     General: Bowel sounds are normal. There is no distension.     Palpations: Abdomen is soft.  Musculoskeletal:        General: No deformity. Normal range of motion.     Cervical back: Normal range of motion and neck supple.  Skin:    General: Skin is warm and dry.     Findings: No erythema or rash.  Neurological:     Mental Status: She is alert and oriented to person, place, and time. Mental status is at baseline.     Cranial Nerves: No cranial nerve deficit.     Coordination: Coordination normal.  Psychiatric:        Mood and Affect: Mood normal.     LABORATORY DATA:  I have reviewed the data as listed Lab Results  Component Value Date   WBC 3.3 (L) 07/21/2021   HGB 12.3 07/21/2021   HCT 38.6 07/21/2021   MCV 86.2 07/21/2021   PLT 230 07/21/2021   Recent Labs    07/07/21 0811 07/14/21 0853 07/21/21 0838  NA 136 135 135  K 4.1 4.5 4.3  CL 103 107 105  CO2 '26 24 26  ' GLUCOSE 106* 93 97  BUN '19 13 19  ' CREATININE 0.66 0.60 0.68  CALCIUM 8.9 9.0 9.0  GFRNONAA >60 >60 >60  PROT 7.4 7.4 7.4  ALBUMIN 4.1 4.2 4.1  AST '22 25 30  ' ALT 32 30 39  ALKPHOS 98 73 84  BILITOT 0.5 0.3 0.7    Iron/TIBC/Ferritin/ %Sat No results found for: IRON, TIBC, FERRITIN, IRONPCTSAT    RADIOGRAPHIC STUDIES: I have personally reviewed the radiological images as listed and agreed with the findings in the report. No results found.    ASSESSMENT & PLAN:  1. Carcinoma of upper-outer  quadrant of left breast in female, estrogen receptor negative ([place])   2. Encounter for antineoplastic chemotherapy   3. Chemotherapy-induced neutropenia ([place])   .Cancer  Staging Carcinoma of upper-outer quadrant of left breast in female, estrogen receptor negative ([place]) Staging form: Breast, AJCC 8th Edition - Clinical stage from 04/29/2021: Stage IIB (cT2, cN0, cM0, G3, ER-, PR-, HER2-) - Signed by Earlie Server, MD on 06/04/2021   #Left breast invasive mammary carcinoma, triple negative, + high-grade DCIS ER positive cT2 N0, stage IIB Labs are reviewed and discussed with patient. Proceed with cycle 3 pembrolizumab/carboplatin AUC 2/Taxol  Labs reviewed and discussed with patient She appears tolerating weekly better Proceed with cycle 2-day 15 chemotherapy treatment  day 16 and day 17 Zarxio for G-CSF support  #Therapy induced neutropenia, resolved.  Continue close monitor.  Patient will receive short acting G-CSF support. Peridex solution swish and spit BID for prophylaxis.  #Transaminitis, resolved.  Supportive care measures are necessary for patient well-being and will be provided as necessary. We spent sufficient time to discuss many aspect of care, questions were answered to patient's satisfaction.  All questions were answered. The patient knows to call the clinic with any problems questions or concerns.  cc Renee Rival, NP    Return of visit: 1 week, lab MD Norma Fredrickson AUC 2 with Taxol    Earlie Server, MD, [name] Hematology Oncology [facility] at [facility] Pager- [phone] 07/21/2021

## 2021-07-22 ENCOUNTER — Inpatient Hospital Stay

## 2021-07-22 DIAGNOSIS — Z171 Estrogen receptor negative status [ER-]: Secondary | ICD-10-CM

## 2021-07-22 DIAGNOSIS — Z5112 Encounter for antineoplastic immunotherapy: Secondary | ICD-10-CM | POA: Diagnosis not present

## 2021-07-22 DIAGNOSIS — C50412 Malignant neoplasm of upper-outer quadrant of left female breast: Secondary | ICD-10-CM

## 2021-07-22 MED ORDER — FILGRASTIM-SNDZ 480 MCG/0.8ML IJ SOSY
480.0000 ug | PREFILLED_SYRINGE | Freq: Once | INTRAMUSCULAR | Status: AC
  Administered 2021-07-22: 480 ug via SUBCUTANEOUS
  Filled 2021-07-22: qty 0.8

## 2021-07-23 ENCOUNTER — Inpatient Hospital Stay

## 2021-07-23 DIAGNOSIS — C50412 Malignant neoplasm of upper-outer quadrant of left female breast: Secondary | ICD-10-CM

## 2021-07-23 DIAGNOSIS — Z5112 Encounter for antineoplastic immunotherapy: Secondary | ICD-10-CM | POA: Diagnosis not present

## 2021-07-23 MED ORDER — FILGRASTIM-SNDZ 480 MCG/0.8ML IJ SOSY
480.0000 ug | PREFILLED_SYRINGE | Freq: Once | INTRAMUSCULAR | Status: AC
  Administered 2021-07-23: 480 ug via SUBCUTANEOUS
  Filled 2021-07-23: qty 0.8

## 2021-07-28 ENCOUNTER — Inpatient Hospital Stay

## 2021-07-28 ENCOUNTER — Inpatient Hospital Stay (HOSPITAL_BASED_OUTPATIENT_CLINIC_OR_DEPARTMENT_OTHER): Admitting: Oncology

## 2021-07-28 ENCOUNTER — Encounter: Payer: Self-pay | Admitting: Oncology

## 2021-07-28 VITALS — BP 137/83 | HR 84 | Temp 98.4°F | Resp 18 | Wt 231.6 lb

## 2021-07-28 DIAGNOSIS — Z171 Estrogen receptor negative status [ER-]: Secondary | ICD-10-CM | POA: Diagnosis not present

## 2021-07-28 DIAGNOSIS — T451X5A Adverse effect of antineoplastic and immunosuppressive drugs, initial encounter: Secondary | ICD-10-CM

## 2021-07-28 DIAGNOSIS — Z95828 Presence of other vascular implants and grafts: Secondary | ICD-10-CM

## 2021-07-28 DIAGNOSIS — C50412 Malignant neoplasm of upper-outer quadrant of left female breast: Secondary | ICD-10-CM

## 2021-07-28 DIAGNOSIS — D701 Agranulocytosis secondary to cancer chemotherapy: Secondary | ICD-10-CM

## 2021-07-28 DIAGNOSIS — Z5111 Encounter for antineoplastic chemotherapy: Secondary | ICD-10-CM

## 2021-07-28 DIAGNOSIS — Z5112 Encounter for antineoplastic immunotherapy: Secondary | ICD-10-CM | POA: Diagnosis not present

## 2021-07-28 LAB — CBC WITH DIFFERENTIAL/PLATELET
Abs Immature Granulocytes: 0.01 10*3/uL (ref 0.00–0.07)
Basophils Absolute: 0 10*3/uL (ref 0.0–0.1)
Basophils Relative: 1 %
Eosinophils Absolute: 0 10*3/uL (ref 0.0–0.5)
Eosinophils Relative: 1 %
HCT: 38.3 % (ref 36.0–46.0)
Hemoglobin: 12.3 g/dL (ref 12.0–15.0)
Immature Granulocytes: 0 %
Lymphocytes Relative: 47 %
Lymphs Abs: 1.5 10*3/uL (ref 0.7–4.0)
MCH: 27.6 pg (ref 26.0–34.0)
MCHC: 32.1 g/dL (ref 30.0–36.0)
MCV: 86.1 fL (ref 80.0–100.0)
Monocytes Absolute: 0.4 10*3/uL (ref 0.1–1.0)
Monocytes Relative: 12 %
Neutro Abs: 1.3 10*3/uL — ABNORMAL LOW (ref 1.7–7.7)
Neutrophils Relative %: 39 %
Platelets: 145 10*3/uL — ABNORMAL LOW (ref 150–400)
RBC: 4.45 MIL/uL (ref 3.87–5.11)
RDW: 13.9 % (ref 11.5–15.5)
Smear Review: NORMAL
WBC: 3.3 10*3/uL — ABNORMAL LOW (ref 4.0–10.5)
nRBC: 0 % (ref 0.0–0.2)

## 2021-07-28 LAB — COMPREHENSIVE METABOLIC PANEL
ALT: 28 U/L (ref 0–44)
AST: 24 U/L (ref 15–41)
Albumin: 4.3 g/dL (ref 3.5–5.0)
Alkaline Phosphatase: 85 U/L (ref 38–126)
Anion gap: 5 (ref 5–15)
BUN: 21 mg/dL — ABNORMAL HIGH (ref 6–20)
CO2: 27 mmol/L (ref 22–32)
Calcium: 9.1 mg/dL (ref 8.9–10.3)
Chloride: 102 mmol/L (ref 98–111)
Creatinine, Ser: 0.67 mg/dL (ref 0.44–1.00)
GFR, Estimated: >60 mL/min (ref >60)
Glucose, Bld: 110 mg/dL — ABNORMAL HIGH (ref 70–99)
Potassium: 4.3 mmol/L (ref 3.5–5.1)
Sodium: 134 mmol/L — ABNORMAL LOW (ref 135–145)
Total Bilirubin: 0.5 mg/dL (ref 0.3–1.2)
Total Protein: 7.7 g/dL (ref 6.5–8.1)

## 2021-07-28 MED ORDER — HEPARIN SOD (PORK) LOCK FLUSH 100 UNIT/ML IV SOLN
500.0000 [IU] | Freq: Once | INTRAVENOUS | Status: AC | PRN
  Administered 2021-07-28: 500 [IU]
  Filled 2021-07-28: qty 5

## 2021-07-28 MED ORDER — SODIUM CHLORIDE 0.9 % IV SOLN
80.0000 mg/m2 | Freq: Once | INTRAVENOUS | Status: AC
  Administered 2021-07-28: 168 mg via INTRAVENOUS
  Filled 2021-07-28: qty 28

## 2021-07-28 MED ORDER — FAMOTIDINE 20 MG IN NS 100 ML IVPB
20.0000 mg | Freq: Once | INTRAVENOUS | Status: AC
Start: 2021-07-28 — End: 2021-07-28
  Administered 2021-07-28: 20 mg via INTRAVENOUS
  Filled 2021-07-28: qty 20

## 2021-07-28 MED ORDER — DIPHENHYDRAMINE HCL 50 MG/ML IJ SOLN
50.0000 mg | Freq: Once | INTRAMUSCULAR | Status: AC
  Administered 2021-07-28: 50 mg via INTRAVENOUS
  Filled 2021-07-28 (×2): qty 1

## 2021-07-28 MED ORDER — SODIUM CHLORIDE 0.9 % IV SOLN
300.0000 mg | Freq: Once | INTRAVENOUS | Status: AC
  Administered 2021-07-28: 300 mg via INTRAVENOUS
  Filled 2021-07-28: qty 30

## 2021-07-28 MED ORDER — SODIUM CHLORIDE 0.9 % IV SOLN
Freq: Once | INTRAVENOUS | Status: AC
  Filled 2021-07-28: qty 250

## 2021-07-28 MED ORDER — SODIUM CHLORIDE 0.9% FLUSH
10.0000 mL | INTRAVENOUS | Status: DC | PRN
  Filled 2021-07-28: qty 10

## 2021-07-28 MED ORDER — SODIUM CHLORIDE 0.9 % IV SOLN
10.0000 mg | Freq: Once | INTRAVENOUS | Status: AC
  Administered 2021-07-28: 10 mg via INTRAVENOUS
  Filled 2021-07-28: qty 10

## 2021-07-28 MED ORDER — HEPARIN SOD (PORK) LOCK FLUSH 100 UNIT/ML IV SOLN
INTRAVENOUS | Status: AC
  Filled 2021-07-28: qty 5

## 2021-07-28 NOTE — Patient Instructions (Signed)
[facility] [place] [facility]  Discharge Instructions: Thank you for choosing [facility] to provide your oncology and hematology care.  If you have a lab appointment with the Cancer Center, please go directly to the Cancer Center and check in at the registration area.  Wear comfortable clothing and clothing appropriate for easy access to any Portacath or PICC line.   We strive to give you quality time with your provider. You may need to reschedule your appointment if you arrive late (15 or more minutes).  Arriving late affects you and other patients whose appointments are after yours.  Also, if you miss three or more appointments without notifying the office, you may be dismissed from the clinic at the provider's discretion.      For prescription refill requests, have your pharmacy contact our office and allow 72 hours for refills to be completed.    Today you received the following chemotherapy and/or immunotherapy agents - paclitaxel, carboplatin      To help prevent nausea and vomiting after your treatment, we encourage you to take your nausea medication as directed.  BELOW ARE SYMPTOMS THAT SHOULD BE REPORTED IMMEDIATELY: *FEVER GREATER THAN 100.4 F (38 C) OR HIGHER *CHILLS OR SWEATING *NAUSEA AND VOMITING THAT IS NOT CONTROLLED WITH YOUR NAUSEA MEDICATION *UNUSUAL SHORTNESS OF BREATH *UNUSUAL BRUISING OR BLEEDING *URINARY PROBLEMS (pain or burning when urinating, or frequent urination) *BOWEL PROBLEMS (unusual diarrhea, constipation, pain near the anus) TENDERNESS IN MOUTH AND THROAT WITH OR WITHOUT PRESENCE OF ULCERS (sore throat, sores in mouth, or a toothache) UNUSUAL RASH, SWELLING OR PAIN  UNUSUAL VAGINAL DISCHARGE OR ITCHING   Items with * indicate a potential emergency and should be followed up as soon as possible or go to the Emergency Department if any problems should occur.  Please show the CHEMOTHERAPY ALERT CARD or IMMUNOTHERAPY ALERT  CARD at check-in to the Emergency Department and triage nurse.  Should you have questions after your visit or need to cancel or reschedule your appointment, please contact [facility]  [phone] and follow the prompts.  Office hours are 8:00 a.m. to 4:30 p.m. Monday - Friday. Please note that voicemails left after 4:00 p.m. may not be returned until the following business day.  We are closed weekends and major holidays. You have access to a nurse at all times for urgent questions. Please call the main number to the clinic [phone] and follow the prompts.  For any non-urgent questions, you may also contact your provider using MyChart. We now offer e-Visits for anyone 18 and older to request care online for non-urgent symptoms. For details visit [email or web address].   Also download the MyChart app! Go to the app store, search "MyChart", open the app, select [facility], and log in with your MyChart username and password.  Due to Covid, a mask is required upon entering the hospital/clinic. If you do not have a mask, one will be given to you upon arrival. For doctor visits, patients may have 1 support person aged 18 or older with them. For treatment visits, patients cannot have anyone with them due to current Covid guidelines and our immunocompromised population.   Paclitaxel injection What is this medication? PACLITAXEL (PAK li TAX el) is a chemotherapy drug. It targets fast dividing cells, like cancer cells, and causes these cells to die. This medicine is used to treat ovarian cancer, breast cancer, lung cancer, Kaposi's sarcoma, andother cancers. This medicine may be used for other purposes;   ask your health care provider orpharmacist if you have questions. COMMON BRAND NAME(S): Onxol, Taxol What should I tell my care team before I take this medication? They need to know if you have any of these conditions: history of irregular heartbeat liver  disease low blood counts, like low white cell, platelet, or red cell counts lung or breathing disease, like asthma tingling of the fingers or toes, or other nerve disorder an unusual or allergic reaction to paclitaxel, alcohol, polyoxyethylated castor oil, other chemotherapy, other medicines, foods, dyes, or preservatives pregnant or trying to get pregnant breast-feeding How should I use this medication? This drug is given as an infusion into a vein. It is administered in a hospitalor clinic by a specially trained health care professional. Talk to your pediatrician regarding the use of this medicine in children.Special care may be needed. Overdosage: If you think you have taken too much of this medicine contact apoison control center or emergency room at once. NOTE: This medicine is only for you. Do not share this medicine with others. What if I miss a dose? It is important not to miss your dose. Call your doctor or health careprofessional if you are unable to keep an appointment. What may interact with this medication? Do not take this medicine with any of the following medications: live virus vaccines This medicine may also interact with the following medications: antiviral medicines for hepatitis, HIV or AIDS certain antibiotics like erythromycin and clarithromycin certain medicines for fungal infections like ketoconazole and itraconazole certain medicines for seizures like carbamazepine, phenobarbital, phenytoin gemfibrozil nefazodone rifampin St. John's wort This list may not describe all possible interactions. Give your health care provider a list of all the medicines, herbs, non-prescription drugs, or dietary supplements you use. Also tell them if you smoke, drink alcohol, or use illegaldrugs. Some items may interact with your medicine. What should I watch for while using this medication? Your condition will be monitored carefully while you are receiving this medicine. You will  need important blood work done while you are taking thismedicine. This medicine can cause serious allergic reactions. To reduce your risk you will need to take other medicine(s) before treatment with this medicine. If you experience allergic reactions like skin rash, itching or hives, swelling of theface, lips, or tongue, tell your doctor or health care professional right away. In some cases, you may be given additional medicines to help with side effects.Follow all directions for their use. This drug may make you feel generally unwell. This is not uncommon, as chemotherapy can affect healthy cells as well as cancer cells. Report any side effects. Continue your course of treatment even though you feel ill unless yourdoctor tells you to stop. Call your doctor or health care professional for advice if you get a fever, chills or sore throat, or other symptoms of a cold or flu. Do not treat yourself. This drug decreases your body's ability to fight infections. Try toavoid being around people who are sick. This medicine may increase your risk to bruise or bleed. Call your doctor orhealth care professional if you notice any unusual bleeding. Be careful brushing and flossing your teeth or using a toothpick because you may get an infection or bleed more easily. If you have any dental work done,tell your dentist you are receiving this medicine. Avoid taking products that contain aspirin, acetaminophen, ibuprofen, naproxen, or ketoprofen unless instructed by your doctor. These medicines may hide afever. Do not become pregnant while taking this medicine. Women should inform their   doctor if they wish to become pregnant or think they might be pregnant. There is a potential for serious side effects to an unborn child. Talk to your health care professional or pharmacist for more information. Do not breast-feed aninfant while taking this medicine. Men are advised not to father a child while receiving this medicine. This  product may contain alcohol. Ask your pharmacist or healthcare provider if this medicine contains alcohol. Be sure to tell all healthcare providers you are taking this medicine. Certain medicines, like metronidazole and disulfiram, can cause an unpleasant reaction when taken with alcohol. The reaction includes flushing, headache, nausea, vomiting, sweating, and increased thirst. Thereaction can last from 30 minutes to several hours. What side effects may I notice from receiving this medication? Side effects that you should report to your doctor or health care professionalas soon as possible: allergic reactions like skin rash, itching or hives, swelling of the face, lips, or tongue breathing problems changes in vision fast, irregular heartbeat high or low blood pressure mouth sores pain, tingling, numbness in the hands or feet signs of decreased platelets or bleeding - bruising, pinpoint red spots on the skin, black, tarry stools, blood in the urine signs of decreased red blood cells - unusually weak or tired, feeling faint or lightheaded, falls signs of infection - fever or chills, cough, sore throat, pain or difficulty passing urine signs and symptoms of liver injury like dark yellow or brown urine; general ill feeling or flu-like symptoms; light-colored stools; loss of appetite; nausea; right upper belly pain; unusually weak or tired; yellowing of the eyes or skin swelling of the ankles, feet, hands unusually slow heartbeat Side effects that usually do not require medical attention (report to yourdoctor or health care professional if they continue or are bothersome): diarrhea hair loss loss of appetite muscle or joint pain nausea, vomiting pain, redness, or irritation at site where injected tiredness This list may not describe all possible side effects. Call your doctor for medical advice about side effects. You may report side effects to FDA at1-800-FDA-1088. Where should I keep my  medication? This drug is given in a hospital or clinic and will not be stored at home. NOTE: This sheet is a summary. It may not cover all possible information. If you have questions about this medicine, talk to your doctor, pharmacist, orhealth care provider.  2022 Elsevier/Gold Standard (2019-11-06 13:37:23)  Carboplatin injection What is this medication? CARBOPLATIN (KAR boe pla tin) is a chemotherapy drug. It targets fast dividing cells, like cancer cells, and causes these cells to die. This medicine is usedto treat ovarian cancer and many other cancers. This medicine may be used for other purposes; ask your health care provider orpharmacist if you have questions. COMMON BRAND NAME(S): Paraplatin What should I tell my care team before I take this medication? They need to know if you have any of these conditions: blood disorders hearing problems kidney disease recent or ongoing radiation therapy an unusual or allergic reaction to carboplatin, cisplatin, other chemotherapy, other medicines, foods, dyes, or preservatives pregnant or trying to get pregnant breast-feeding How should I use this medication? This drug is usually given as an infusion into a vein. It is administered in ahospital or clinic by a specially trained health care professional. Talk to your pediatrician regarding the use of this medicine in children.Special care may be needed. Overdosage: If you think you have taken too much of this medicine contact apoison control center or emergency room at once. NOTE: This medicine   is only for you. Do not share this medicine with others. What if I miss a dose? It is important not to miss a dose. Call your doctor or health careprofessional if you are unable to keep an appointment. What may interact with this medication? medicines for seizures medicines to increase blood counts like filgrastim, pegfilgrastim, sargramostim some antibiotics like amikacin, gentamicin, neomycin,  streptomycin, tobramycin vaccines Talk to your doctor or health care professional before taking any of thesemedicines: acetaminophen aspirin ibuprofen ketoprofen naproxen This list may not describe all possible interactions. Give your health care provider a list of all the medicines, herbs, non-prescription drugs, or dietary supplements you use. Also tell them if you smoke, drink alcohol, or use illegaldrugs. Some items may interact with your medicine. What should I watch for while using this medication? Your condition will be monitored carefully while you are receiving this medicine. You will need important blood work done while you are taking thismedicine. This drug may make you feel generally unwell. This is not uncommon, as chemotherapy can affect healthy cells as well as cancer cells. Report any side effects. Continue your course of treatment even though you feel ill unless yourdoctor tells you to stop. In some cases, you may be given additional medicines to help with side effects.Follow all directions for their use. Call your doctor or health care professional for advice if you get a fever, chills or sore throat, or other symptoms of a cold or flu. Do not treat yourself. This drug decreases your body's ability to fight infections. Try toavoid being around people who are sick. This medicine may increase your risk to bruise or bleed. Call your doctor orhealth care professional if you notice any unusual bleeding. Be careful brushing and flossing your teeth or using a toothpick because you may get an infection or bleed more easily. If you have any dental work done,tell your dentist you are receiving this medicine. Avoid taking products that contain aspirin, acetaminophen, ibuprofen, naproxen, or ketoprofen unless instructed by your doctor. These medicines may hide afever. Do not become pregnant while taking this medicine. Women should inform their doctor if they wish to become pregnant or think  they might be pregnant. There is a potential for serious side effects to an unborn child. Talk to your health care professional or pharmacist for more information. Do not breast-feed aninfant while taking this medicine. What side effects may I notice from receiving this medication? Side effects that you should report to your doctor or health care professionalas soon as possible: allergic reactions like skin rash, itching or hives, swelling of the face, lips, or tongue signs of infection - fever or chills, cough, sore throat, pain or difficulty passing urine signs of decreased platelets or bleeding - bruising, pinpoint red spots on the skin, black, tarry stools, nosebleeds signs of decreased red blood cells - unusually weak or tired, fainting spells, lightheadedness breathing problems changes in hearing changes in vision chest pain high blood pressure low blood counts - This drug may decrease the number of white blood cells, red blood cells and platelets. You may be at increased risk for infections and bleeding. nausea and vomiting pain, swelling, redness or irritation at the injection site pain, tingling, numbness in the hands or feet problems with balance, talking, walking trouble passing urine or change in the amount of urine Side effects that usually do not require medical attention (report to yourdoctor or health care professional if they continue or are bothersome): hair loss loss of appetite metallic   taste in the mouth or changes in taste This list may not describe all possible side effects. Call your doctor for medical advice about side effects. You may report side effects to FDA at1-800-FDA-1088. Where should I keep my medication? This drug is given in a hospital or clinic and will not be stored at home. NOTE: This sheet is a summary. It may not cover all possible information. If you have questions about this medicine, talk to your doctor, pharmacist, orhealth care provider.  2022  Elsevier/Gold Standard (2008-03-11 14:38:05)  

## 2021-07-28 NOTE — Progress Notes (Signed)
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  female with PMH listed below was seen in consultation at the request of  Renee Rival, NP  for evaluation of triple negative breast cancer.  03/30/2021, screening mammogram with 3D 2 nodular areas of asymmetric density demonstrated within the superior lateral aspect of the left breast middle third depth.  Finding was best appreciated on 3D tomosynthesis imaging. Targeted ultrasound showed left upper outer breast 1:30 position 1.1 cm round hypoechoic mass with angular margins. 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 Bary Castilla yesterday.  She presents to establish care with me today Patient did not notice this left mass prior to the mammogram.   Case was discussed on Tumor board.  Pathology reports and mammogram/ultrasound were done at outside facility and results were reviewed and discussed with patient and her husband.  LN status was not mentioned on her [place]. Recommend additional images with mammogram and MRI, neoadjuvant chemotherapy  Family history of breast cancer: Breast cancer in her sister, her late 55s, early 53s; and being paternal grandmother Menarche: 74 Age at first live childbirth: 57  patient has 2 sons and 1 daughter.  Daughter has passed away Used OCP:  Used estrogen and progesterone therapy: Denies History of Radiation to the chest: Denies Previous of breast biopsy: Denies  /25/2022 diagnostic mammogram of left breast showed 2.1 cm biopsy-proven  malignancy in the lateral left breast at 2:00, 4 cm from nipple.  Directly adjacent cystic mass, 1.5 cm in size, consistent with biopsy hematoma.  1 abnormal and 1 borderline abnormal left axillary lymph node  05/18/2021 bilateral breast MRI with and without contrast showed 2.5 x 3 x 2.5 cm hematoma containing biopsy clip artifact within the upper outer left breast.  1.6 cm nodular non-mass-like enhancement 2.5 cm posterior/superior to the biopsy clip may represent biopsy-proven malignancy or additional areas of malignancy.  Single abnormal appearing left axillary lymph node with eccentric cortical thickening.  No MRI evidence of the right breast malignancy.  #05/07/2021, Mediport was placed by Dr. Bary Castilla #05/14/2021 echocardiogram showed LVEF 60-55%  05/20/2021 Slide consultation of her left breast biopsy from outside institution - invasive mammary carcinoma, no special type, grade 3, DCIS and LVI not identified. ER negative, PR negative, HER2 IHC negative. Ki 67 70-80%    05/20/2021 ultrasound-guided left axillary lymph node was negative for malignancy.  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 D15- not done due to cytopenia  INTERVAL HISTORY Peggy Bates is a 55 y.o. female who has above history reviewed by me today presents for follow up visit for management of left triple negative breast cancer Problems and complaints are listed below: Patient is on neoadjuvant chemotherapy She tolerates well.  No new complaints.  No fever, chills, nausea vomiting. Mild intermittent left toe numbness and tingling.    Review of Systems  Constitutional:  Negative for appetite change, chills, fatigue and fever.  HENT:   Negative for hearing loss and voice change.   Eyes:  Negative for eye problems.  Respiratory:   Negative for chest tightness and cough.   Cardiovascular:  Negative for chest pain.  Gastrointestinal:  Negative for abdominal distention, abdominal pain and blood in stool.  Endocrine: Negative for hot flashes.  Genitourinary:  Negative for difficulty urinating and frequency.   Musculoskeletal:  Negative for arthralgias.  Skin:  Negative for itching and rash.  Neurological:  Negative for extremity weakness.  Hematological:  Negative for adenopathy.  Psychiatric/Behavioral:  Negative for confusion.    MEDICAL HISTORY:  Past Medical History:  Diagnosis Date   Arthritis    knees   COVID-19 12/2020   Depression    Family history of breast cancer    Goiter    Malignant neoplasm of left female breast ([place]) 04/07/2021    SURGICAL HISTORY: Past Surgical History:  Procedure Laterality Date   BREAST BIOPSY Left 04/07/2021   [place] in [place]:u/s bx triple neg   BREAST BIOPSY Left 05/20/2021   [place] Axilla Bx, hydro 3 marker, path pending    KNEE ARTHROSCOPY     PORTACATH PLACEMENT Right 05/07/2021   Procedure: INSERTION PORT-A-CATH;  Surgeon: Robert Bellow, MD;  Location: [facility] ORS;  Service: General;  Laterality: Right;   TOOTH EXTRACTION     TRANSCERVICAL UTERINE FIBROID(S) ABLATION  2010    SOCIAL HISTORY: Social History   Socioeconomic History   Marital status: Married    Spouse name: Not on file   Number of children: Not on file   Years of education: Not on file   Highest education level: Not on file  Occupational History   Not on file  Tobacco Use   Smoking status: Never   Smokeless tobacco: Never  Vaping Use   Vaping Use: Never used  Substance and Sexual Activity   Alcohol use: Never   Drug use: Never   Sexual activity: Yes  Other Topics Concern   Not on file  Social History Narrative   ** Merged History Encounter **       Social Determinants of Health   Financial Resource Strain: Not on file  Food Insecurity: Not on file   Transportation Needs: Not on file  Physical Activity: Not on file  Stress: Not on file  Social Connections: Not on file  Intimate Partner Violence: Not on file    FAMILY HISTORY: Family History  Problem Relation Age of Onset   Breast cancer Paternal Grandmother    Breast cancer Sister        dx 59s, recurrence x3   Diabetes Sister    Diabetes Father    Throat cancer Maternal Grandmother     ALLERGIES:  has No Known Allergies.  MEDICATIONS:  Current Outpatient Medications  Medication Sig Dispense Refill   chlorhexidine (PERIDEX) 0.12 % solution Use as directed 10 mLs in the mouth or throat 2 (two) times daily. 473 mL 2   cholecalciferol (VITAMIN D) 25 MCG (1000 UNIT) tablet Take 1,000 Units by mouth daily.     dexamethasone (DECADRON) 4 MG tablet Take 2 tablets once a day for 3 days after carboplatin and AC chemotherapy. Take with food. 30 tablet 1   diclofenac Sodium (VOLTAREN) 1 % GEL Apply 1 application topically daily.     gabapentin (NEURONTIN) 600 MG tablet Take 600 mg by mouth every morning.     Glucosamine-Chondroitin (MOVE FREE PO) Take 1 tablet by mouth daily.  levonorgestrel (MIRENA) 20 MCG/DAY IUD 1 each by Intrauterine route once.     lidocaine-prilocaine (EMLA) cream Apply to affected area once 30 g 3   meloxicam (MOBIC) 15 MG tablet Take 15 mg by mouth daily.     ondansetron (ZOFRAN) 8 MG tablet Take 1 tablet (8 mg total) by mouth 2 (two) times daily as needed. Start on the third day after carboplatin and AC chemotherapy. (Patient not taking: No sig reported) 30 tablet 1   phentermine (ADIPEX-P) 37.5 MG tablet 1 tablet (Patient not taking: Reported on 07/28/2021)     prochlorperazine (COMPAZINE) 10 MG tablet Take 1 tablet (10 mg total) by mouth every 6 (six) hours as needed (Nausea or vomiting). (Patient not taking: No sig reported) 30 tablet 1   No current facility-administered medications for this visit.   Facility-Administered Medications Ordered in Other  Visits  Medication Dose Route Frequency Provider Last Rate Last Admin   sodium chloride flush (NS) 0.9 % injection 10 mL  10 mL Intracatheter PRN Earlie Server, MD         PHYSICAL EXAMINATION: ECOG PERFORMANCE STATUS: 0 - Asymptomatic Vitals:   07/28/21 1007  BP: 137/83  Pulse: 84  Resp: 18  Temp: 98.4 F (36.9 C)   Filed Weights   07/28/21 1007  Weight: 231 lb 9.6 oz (105.1 kg)    Physical Exam Constitutional:      General: She is not in acute distress. HENT:     Head: Normocephalic and atraumatic.  Eyes:     General: No scleral icterus. Cardiovascular:     Rate and Rhythm: Normal rate and regular rhythm.     Heart sounds: Normal heart sounds.  Pulmonary:     Effort: Pulmonary effort is normal. No respiratory distress.     Breath sounds: No wheezing.  Abdominal:     General: Bowel sounds are normal. There is no distension.     Palpations: Abdomen is soft.  Musculoskeletal:        General: No deformity. Normal range of motion.     Cervical back: Normal range of motion and neck supple.  Skin:    General: Skin is warm and dry.     Findings: No erythema or rash.  Neurological:     Mental Status: She is alert and oriented to person, place, and time. Mental status is at baseline.     Cranial Nerves: No cranial nerve deficit.     Coordination: Coordination normal.  Psychiatric:        Mood and Affect: Mood normal.     LABORATORY DATA:  I have reviewed the data as listed Lab Results  Component Value Date   WBC 3.3 (L) 07/28/2021   HGB 12.3 07/28/2021   HCT 38.3 07/28/2021   MCV 86.1 07/28/2021   PLT 145 (L) 07/28/2021   Recent Labs    07/14/21 0853 07/21/21 0838 07/28/21 0939  NA 135 135 134*  K 4.5 4.3 4.3  CL 107 105 102  CO2 _0 GLUCOSE 93 97 110*  BUN 13 19 21*  CREATININE 0.60 0.68 0.67  CALCIUM 9.0 9.0 9.1  GFRNONAA >60 >60 >60  PROT 7.4 7.4 7.7  ALBUMIN 4.2 4.1 4.3  AST _1 ALT 30 39 28  ALKPHOS 73 84 85  BILITOT 0.3 0.7 0.5     Iron/TIBC/Ferritin/ %Sat No results found for: IRON, TIBC, FERRITIN, IRONPCTSAT    RADIOGRAPHIC STUDIES: I have personally reviewed the radiological images as listed  and agreed with the findings in the report. No results found.    ASSESSMENT & PLAN:  1. Carcinoma of upper-outer quadrant of left breast in female, estrogen receptor negative ([place])   2. Encounter for antineoplastic chemotherapy   3. Chemotherapy-induced neutropenia (HCC)   4. Port-A-Cath in place   .Cancer Staging Carcinoma of upper-outer quadrant of left breast in female, estrogen receptor negative ([place]) Staging form: Breast, AJCC 8th Edition - Clinical stage from 04/29/2021: Stage IIB (cT2, cN0, cM0, G3, ER-, PR-, HER2-) - Signed by Earlie Server, MD on 06/04/2021   #Left breast invasive mammary carcinoma, triple negative, + high-grade DCIS ER positive cT2 N0, stage IIB Labs are reviewed and discussed with patient. Proceed with Cycle 3 Day 8 carboplatin AUC 2/Taxol  She appears tolerating weekly better Proceed with cycle 3-day 8 chemotherapy treatment  day 9 and day 10 Zarxio for G-CSF support  #Therapy induced neutropenia, resolved.  Continue close monitor.  Patient will receive short acting G-CSF support. Peridex solution swish and spit BID for prophylaxis.  #Transaminitis, resolved.  Supportive care measures are necessary for patient well-being and will be provided as necessary. We spent sufficient time to discuss many aspect of care, questions were answered to patient's satisfaction.  All questions were answered. The patient knows to call the clinic with any problems questions or concerns.  cc Renee Rival, NP    Return of visit: 1 week, lab MD Norma Fredrickson AUC 2 with Taxol    Earlie Server, MD, [name] Hematology Oncology [facility] Pager- [phone] 07/28/2021

## 2021-07-28 NOTE — Progress Notes (Signed)
Patient here for follow up. No new concerns voiced.  °

## 2021-07-29 ENCOUNTER — Inpatient Hospital Stay

## 2021-07-29 DIAGNOSIS — Z5112 Encounter for antineoplastic immunotherapy: Secondary | ICD-10-CM | POA: Diagnosis not present

## 2021-07-29 DIAGNOSIS — Z171 Estrogen receptor negative status [ER-]: Secondary | ICD-10-CM

## 2021-07-29 DIAGNOSIS — C50412 Malignant neoplasm of upper-outer quadrant of left female breast: Secondary | ICD-10-CM

## 2021-07-29 MED ORDER — FILGRASTIM-SNDZ 480 MCG/0.8ML IJ SOSY
480.0000 ug | PREFILLED_SYRINGE | Freq: Once | INTRAMUSCULAR | Status: AC
  Administered 2021-07-29: 480 ug via SUBCUTANEOUS
  Filled 2021-07-29: qty 0.8

## 2021-07-30 ENCOUNTER — Inpatient Hospital Stay

## 2021-07-30 DIAGNOSIS — Z171 Estrogen receptor negative status [ER-]: Secondary | ICD-10-CM

## 2021-07-30 DIAGNOSIS — Z5112 Encounter for antineoplastic immunotherapy: Secondary | ICD-10-CM | POA: Diagnosis not present

## 2021-07-30 DIAGNOSIS — C50412 Malignant neoplasm of upper-outer quadrant of left female breast: Secondary | ICD-10-CM

## 2021-07-30 MED ORDER — FILGRASTIM-SNDZ 480 MCG/0.8ML IJ SOSY
480.0000 ug | PREFILLED_SYRINGE | Freq: Once | INTRAMUSCULAR | Status: AC
  Administered 2021-07-30: 480 ug via SUBCUTANEOUS
  Filled 2021-07-30: qty 0.8

## 2021-08-04 ENCOUNTER — Inpatient Hospital Stay

## 2021-08-04 ENCOUNTER — Encounter: Payer: Self-pay | Admitting: Oncology

## 2021-08-04 ENCOUNTER — Inpatient Hospital Stay (HOSPITAL_BASED_OUTPATIENT_CLINIC_OR_DEPARTMENT_OTHER): Admitting: Oncology

## 2021-08-04 VITALS — BP 111/67 | HR 92 | Temp 98.2°F | Wt 234.3 lb

## 2021-08-04 DIAGNOSIS — Z95828 Presence of other vascular implants and grafts: Secondary | ICD-10-CM

## 2021-08-04 DIAGNOSIS — Z5111 Encounter for antineoplastic chemotherapy: Secondary | ICD-10-CM

## 2021-08-04 DIAGNOSIS — D701 Agranulocytosis secondary to cancer chemotherapy: Secondary | ICD-10-CM

## 2021-08-04 DIAGNOSIS — G620 Drug-induced polyneuropathy: Secondary | ICD-10-CM

## 2021-08-04 DIAGNOSIS — Z5112 Encounter for antineoplastic immunotherapy: Secondary | ICD-10-CM | POA: Diagnosis not present

## 2021-08-04 DIAGNOSIS — E66811 Obesity, class 1: Secondary | ICD-10-CM | POA: Insufficient documentation

## 2021-08-04 DIAGNOSIS — E669 Obesity, unspecified: Secondary | ICD-10-CM | POA: Insufficient documentation

## 2021-08-04 DIAGNOSIS — C50412 Malignant neoplasm of upper-outer quadrant of left female breast: Secondary | ICD-10-CM | POA: Diagnosis not present

## 2021-08-04 DIAGNOSIS — T451X5D Adverse effect of antineoplastic and immunosuppressive drugs, subsequent encounter: Secondary | ICD-10-CM | POA: Diagnosis not present

## 2021-08-04 DIAGNOSIS — T451X5A Adverse effect of antineoplastic and immunosuppressive drugs, initial encounter: Secondary | ICD-10-CM

## 2021-08-04 DIAGNOSIS — Z171 Estrogen receptor negative status [ER-]: Secondary | ICD-10-CM

## 2021-08-04 LAB — CBC WITH DIFFERENTIAL/PLATELET
Abs Immature Granulocytes: 0.02 10*3/uL (ref 0.00–0.07)
Basophils Absolute: 0 10*3/uL (ref 0.0–0.1)
Basophils Relative: 0 %
Eosinophils Absolute: 0 10*3/uL (ref 0.0–0.5)
Eosinophils Relative: 1 %
HCT: 36.7 % (ref 36.0–46.0)
Hemoglobin: 11.8 g/dL — ABNORMAL LOW (ref 12.0–15.0)
Immature Granulocytes: 1 %
Lymphocytes Relative: 53 %
Lymphs Abs: 1.4 10*3/uL (ref 0.7–4.0)
MCH: 27.9 pg (ref 26.0–34.0)
MCHC: 32.2 g/dL (ref 30.0–36.0)
MCV: 86.8 fL (ref 80.0–100.0)
Monocytes Absolute: 0.2 10*3/uL (ref 0.1–1.0)
Monocytes Relative: 9 %
Neutro Abs: 1 10*3/uL — ABNORMAL LOW (ref 1.7–7.7)
Neutrophils Relative %: 36 %
Platelets: 110 10*3/uL — ABNORMAL LOW (ref 150–400)
RBC: 4.23 MIL/uL (ref 3.87–5.11)
RDW: 14.6 % (ref 11.5–15.5)
WBC: 2.6 10*3/uL — ABNORMAL LOW (ref 4.0–10.5)
nRBC: 0 % (ref 0.0–0.2)

## 2021-08-04 LAB — COMPREHENSIVE METABOLIC PANEL
ALT: 36 U/L (ref 0–44)
AST: 27 U/L (ref 15–41)
Albumin: 4.2 g/dL (ref 3.5–5.0)
Alkaline Phosphatase: 92 U/L (ref 38–126)
Anion gap: 6 (ref 5–15)
BUN: 18 mg/dL (ref 6–20)
CO2: 27 mmol/L (ref 22–32)
Calcium: 9.2 mg/dL (ref 8.9–10.3)
Chloride: 103 mmol/L (ref 98–111)
Creatinine, Ser: 0.76 mg/dL (ref 0.44–1.00)
GFR, Estimated: >60 mL/min (ref >60)
Glucose, Bld: 104 mg/dL — ABNORMAL HIGH (ref 70–99)
Potassium: 4.5 mmol/L (ref 3.5–5.1)
Sodium: 136 mmol/L (ref 135–145)
Total Bilirubin: 0.8 mg/dL (ref 0.3–1.2)
Total Protein: 7.3 g/dL (ref 6.5–8.1)

## 2021-08-04 LAB — TSH: TSH: 0.636 u[IU]/mL (ref 0.350–4.500)

## 2021-08-04 MED ORDER — SODIUM CHLORIDE 0.9 % IV SOLN
Freq: Once | INTRAVENOUS | Status: AC
  Filled 2021-08-04: qty 250

## 2021-08-04 MED ORDER — SODIUM CHLORIDE 0.9 % IV SOLN
70.0000 mg/m2 | Freq: Once | INTRAVENOUS | Status: AC
  Administered 2021-08-04: 150 mg via INTRAVENOUS
  Filled 2021-08-04: qty 25

## 2021-08-04 MED ORDER — FAMOTIDINE 20 MG IN NS 100 ML IVPB
20.0000 mg | Freq: Once | INTRAVENOUS | Status: AC
  Administered 2021-08-04: 20 mg via INTRAVENOUS
  Filled 2021-08-04: qty 20

## 2021-08-04 MED ORDER — HEPARIN SOD (PORK) LOCK FLUSH 100 UNIT/ML IV SOLN
INTRAVENOUS | Status: AC
  Administered 2021-08-04: 500 [IU]
  Filled 2021-08-04: qty 5

## 2021-08-04 MED ORDER — SODIUM CHLORIDE 0.9 % IV SOLN
300.0000 mg | Freq: Once | INTRAVENOUS | Status: AC
  Administered 2021-08-04: 300 mg via INTRAVENOUS
  Filled 2021-08-04: qty 30

## 2021-08-04 MED ORDER — DIPHENHYDRAMINE HCL 50 MG/ML IJ SOLN
50.0000 mg | Freq: Once | INTRAMUSCULAR | Status: AC
  Administered 2021-08-04: 50 mg via INTRAVENOUS
  Filled 2021-08-04: qty 1

## 2021-08-04 MED ORDER — SODIUM CHLORIDE 0.9 % IV SOLN
10.0000 mg | Freq: Once | INTRAVENOUS | Status: AC
  Administered 2021-08-04: 10 mg via INTRAVENOUS
  Filled 2021-08-04: qty 10

## 2021-08-04 MED ORDER — HEPARIN SOD (PORK) LOCK FLUSH 100 UNIT/ML IV SOLN
500.0000 [IU] | Freq: Once | INTRAVENOUS | Status: DC
  Filled 2021-08-04: qty 5

## 2021-08-04 MED ORDER — SODIUM CHLORIDE 0.9% FLUSH
10.0000 mL | Freq: Once | INTRAVENOUS | Status: AC
  Administered 2021-08-04: 10 mL via INTRAVENOUS
  Filled 2021-08-04: qty 10

## 2021-08-04 NOTE — Patient Instructions (Signed)
[facility]  Discharge Instructions: Thank you for choosing [facility] to provide your oncology and hematology care.  If you have a lab appointment with the Cancer Center, please go directly to the Cancer Center and check in at the registration area.  Wear comfortable clothing and clothing appropriate for easy access to any Portacath or PICC line.   We strive to give you quality time with your provider. You may need to reschedule your appointment if you arrive late (15 or more minutes).  Arriving late affects you and other patients whose appointments are after yours.  Also, if you miss three or more appointments without notifying the office, you may be dismissed from the clinic at the provider's discretion.      For prescription refill requests, have your pharmacy contact our office and allow 72 hours for refills to be completed.    Today you received the following chemotherapy and/or immunotherapy agents CARBOPLATIN and TAXOL      To help prevent nausea and vomiting after your treatment, we encourage you to take your nausea medication as directed.  BELOW ARE SYMPTOMS THAT SHOULD BE REPORTED IMMEDIATELY: *FEVER GREATER THAN 100.4 F (38 C) OR HIGHER *CHILLS OR SWEATING *NAUSEA AND VOMITING THAT IS NOT CONTROLLED WITH YOUR NAUSEA MEDICATION *UNUSUAL SHORTNESS OF BREATH *UNUSUAL BRUISING OR BLEEDING *URINARY PROBLEMS (pain or burning when urinating, or frequent urination) *BOWEL PROBLEMS (unusual diarrhea, constipation, pain near the anus) TENDERNESS IN MOUTH AND THROAT WITH OR WITHOUT PRESENCE OF ULCERS (sore throat, sores in mouth, or a toothache) UNUSUAL RASH, SWELLING OR PAIN  UNUSUAL VAGINAL DISCHARGE OR ITCHING   Items with * indicate a potential emergency and should be followed up as soon as possible or go to the Emergency Department if any problems should occur.  Please show the CHEMOTHERAPY ALERT CARD or IMMUNOTHERAPY ALERT CARD  at check-in to the Emergency Department and triage nurse.  Should you have questions after your visit or need to cancel or reschedule your appointment, please contact [facility]  [phone] and follow the prompts.  Office hours are 8:00 a.m. to 4:30 p.m. Monday - Friday. Please note that voicemails left after 4:00 p.m. may not be returned until the following business day.  We are closed weekends and major holidays. You have access to a nurse at all times for urgent questions. Please call the main number to the clinic [phone] and follow the prompts.  For any non-urgent questions, you may also contact your provider using MyChart. We now offer e-Visits for anyone 18 and older to request care online for non-urgent symptoms. For details visit [email or web address].   Also download the MyChart app! Go to the app store, search "MyChart", open the app, select [facility], and log in with your MyChart username and password.  Due to Covid, a mask is required upon entering the hospital/clinic. If you do not have a mask, one will be given to you upon arrival. For doctor visits, patients may have 1 support person aged 18 or older with them. For treatment visits, patients cannot have anyone with them due to current Covid guidelines and our immunocompromised population.   Carboplatin injection What is this medication? CARBOPLATIN (KAR boe pla tin) is a chemotherapy drug. It targets fast dividing cells, like cancer cells, and causes these cells to die. This medicine is used to treat ovarian cancer and many other cancers. This medicine may be used for other purposes; ask your health care   provider or pharmacist if you have questions. COMMON BRAND NAME(S): Paraplatin What should I tell my care team before I take this medication? They need to know if you have any of these conditions: blood disorders hearing problems kidney disease recent or ongoing radiation therapy an  unusual or allergic reaction to carboplatin, cisplatin, other chemotherapy, other medicines, foods, dyes, or preservatives pregnant or trying to get pregnant breast-feeding How should I use this medication? This drug is usually given as an infusion into a vein. It is administered in a hospital or clinic by a specially trained health care professional. Talk to your pediatrician regarding the use of this medicine in children. Special care may be needed. Overdosage: If you think you have taken too much of this medicine contact a poison control center or emergency room at once. NOTE: This medicine is only for you. Do not share this medicine with others. What if I miss a dose? It is important not to miss a dose. Call your doctor or health care professional if you are unable to keep an appointment. What may interact with this medication? medicines for seizures medicines to increase blood counts like filgrastim, pegfilgrastim, sargramostim some antibiotics like amikacin, gentamicin, neomycin, streptomycin, tobramycin vaccines Talk to your doctor or health care professional before taking any of these medicines: acetaminophen aspirin ibuprofen ketoprofen naproxen This list may not describe all possible interactions. Give your health care provider a list of all the medicines, herbs, non-prescription drugs, or dietary supplements you use. Also tell them if you smoke, drink alcohol, or use illegal drugs. Some items may interact with your medicine. What should I watch for while using this medication? Your condition will be monitored carefully while you are receiving this medicine. You will need important blood work done while you are taking this medicine. This drug may make you feel generally unwell. This is not uncommon, as chemotherapy can affect healthy cells as well as cancer cells. Report any side effects. Continue your course of treatment even though you feel ill unless your doctor tells you to  stop. In some cases, you may be given additional medicines to help with side effects. Follow all directions for their use. Call your doctor or health care professional for advice if you get a fever, chills or sore throat, or other symptoms of a cold or flu. Do not treat yourself. This drug decreases your body's ability to fight infections. Try to avoid being around people who are sick. This medicine may increase your risk to bruise or bleed. Call your doctor or health care professional if you notice any unusual bleeding. Be careful brushing and flossing your teeth or using a toothpick because you may get an infection or bleed more easily. If you have any dental work done, tell your dentist you are receiving this medicine. Avoid taking products that contain aspirin, acetaminophen, ibuprofen, naproxen, or ketoprofen unless instructed by your doctor. These medicines may hide a fever. Do not become pregnant while taking this medicine. Women should inform their doctor if they wish to become pregnant or think they might be pregnant. There is a potential for serious side effects to an unborn child. Talk to your health care professional or pharmacist for more information. Do not breast-feed an infant while taking this medicine. What side effects may I notice from receiving this medication? Side effects that you should report to your doctor or health care professional as soon as possible: allergic reactions like skin rash, itching or hives, swelling of the face, lips,   or tongue signs of infection - fever or chills, cough, sore throat, pain or difficulty passing urine signs of decreased platelets or bleeding - bruising, pinpoint red spots on the skin, black, tarry stools, nosebleeds signs of decreased red blood cells - unusually weak or tired, fainting spells, lightheadedness breathing problems changes in hearing changes in vision chest pain high blood pressure low blood counts - This drug may decrease the  number of white blood cells, red blood cells and platelets. You may be at increased risk for infections and bleeding. nausea and vomiting pain, swelling, redness or irritation at the injection site pain, tingling, numbness in the hands or feet problems with balance, talking, walking trouble passing urine or change in the amount of urine Side effects that usually do not require medical attention (report to your doctor or health care professional if they continue or are bothersome): hair loss loss of appetite metallic taste in the mouth or changes in taste This list may not describe all possible side effects. Call your doctor for medical advice about side effects. You may report side effects to FDA at 1-800-FDA-1088. Where should I keep my medication? This drug is given in a hospital or clinic and will not be stored at home. NOTE: This sheet is a summary. It may not cover all possible information. If you have questions about this medicine, talk to your doctor, pharmacist, or health care provider.  2022 Elsevier/Gold Standard (2008-03-11 14:38:05)  Paclitaxel injection What is this medication? PACLITAXEL (PAK li TAX el) is a chemotherapy drug. It targets fast dividing cells, like cancer cells, and causes these cells to die. This medicine is used to treat ovarian cancer, breast cancer, lung cancer, Kaposi's sarcoma, and other cancers. This medicine may be used for other purposes; ask your health care provider or pharmacist if you have questions. COMMON BRAND NAME(S): Onxol, Taxol What should I tell my care team before I take this medication? They need to know if you have any of these conditions: history of irregular heartbeat liver disease low blood counts, like low white cell, platelet, or red cell counts lung or breathing disease, like asthma tingling of the fingers or toes, or other nerve disorder an unusual or allergic reaction to paclitaxel, alcohol, polyoxyethylated castor oil, other  chemotherapy, other medicines, foods, dyes, or preservatives pregnant or trying to get pregnant breast-feeding How should I use this medication? This drug is given as an infusion into a vein. It is administered in a hospital or clinic by a specially trained health care professional. Talk to your pediatrician regarding the use of this medicine in children. Special care may be needed. Overdosage: If you think you have taken too much of this medicine contact a poison control center or emergency room at once. NOTE: This medicine is only for you. Do not share this medicine with others. What if I miss a dose? It is important not to miss your dose. Call your doctor or health care professional if you are unable to keep an appointment. What may interact with this medication? Do not take this medicine with any of the following medications: live virus vaccines This medicine may also interact with the following medications: antiviral medicines for hepatitis, HIV or AIDS certain antibiotics like erythromycin and clarithromycin certain medicines for fungal infections like ketoconazole and itraconazole certain medicines for seizures like carbamazepine, phenobarbital, phenytoin gemfibrozil nefazodone rifampin St. John's wort This list may not describe all possible interactions. Give your health care provider a list of all the medicines,   herbs, non-prescription drugs, or dietary supplements you use. Also tell them if you smoke, drink alcohol, or use illegal drugs. Some items may interact with your medicine. What should I watch for while using this medication? Your condition will be monitored carefully while you are receiving this medicine. You will need important blood work done while you are taking this medicine. This medicine can cause serious allergic reactions. To reduce your risk you will need to take other medicine(s) before treatment with this medicine. If you experience allergic reactions like skin  rash, itching or hives, swelling of the face, lips, or tongue, tell your doctor or health care professional right away. In some cases, you may be given additional medicines to help with side effects. Follow all directions for their use. This drug may make you feel generally unwell. This is not uncommon, as chemotherapy can affect healthy cells as well as cancer cells. Report any side effects. Continue your course of treatment even though you feel ill unless your doctor tells you to stop. Call your doctor or health care professional for advice if you get a fever, chills or sore throat, or other symptoms of a cold or flu. Do not treat yourself. This drug decreases your body's ability to fight infections. Try to avoid being around people who are sick. This medicine may increase your risk to bruise or bleed. Call your doctor or health care professional if you notice any unusual bleeding. Be careful brushing and flossing your teeth or using a toothpick because you may get an infection or bleed more easily. If you have any dental work done, tell your dentist you are receiving this medicine. Avoid taking products that contain aspirin, acetaminophen, ibuprofen, naproxen, or ketoprofen unless instructed by your doctor. These medicines may hide a fever. Do not become pregnant while taking this medicine. Women should inform their doctor if they wish to become pregnant or think they might be pregnant. There is a potential for serious side effects to an unborn child. Talk to your health care professional or pharmacist for more information. Do not breast-feed an infant while taking this medicine. Men are advised not to father a child while receiving this medicine. This product may contain alcohol. Ask your pharmacist or healthcare provider if this medicine contains alcohol. Be sure to tell all healthcare providers you are taking this medicine. Certain medicines, like metronidazole and disulfiram, can cause an unpleasant  reaction when taken with alcohol. The reaction includes flushing, headache, nausea, vomiting, sweating, and increased thirst. The reaction can last from 30 minutes to several hours. What side effects may I notice from receiving this medication? Side effects that you should report to your doctor or health care professional as soon as possible: allergic reactions like skin rash, itching or hives, swelling of the face, lips, or tongue breathing problems changes in vision fast, irregular heartbeat high or low blood pressure mouth sores pain, tingling, numbness in the hands or feet signs of decreased platelets or bleeding - bruising, pinpoint red spots on the skin, black, tarry stools, blood in the urine signs of decreased red blood cells - unusually weak or tired, feeling faint or lightheaded, falls signs of infection - fever or chills, cough, sore throat, pain or difficulty passing urine signs and symptoms of liver injury like dark yellow or brown urine; general ill feeling or flu-like symptoms; light-colored stools; loss of appetite; nausea; right upper belly pain; unusually weak or tired; yellowing of the eyes or skin swelling of the ankles, feet, hands   unusually slow heartbeat Side effects that usually do not require medical attention (report to your doctor or health care professional if they continue or are bothersome): diarrhea hair loss loss of appetite muscle or joint pain nausea, vomiting pain, redness, or irritation at site where injected tiredness This list may not describe all possible side effects. Call your doctor for medical advice about side effects. You may report side effects to FDA at 1-800-FDA-1088. Where should I keep my medication? This drug is given in a hospital or clinic and will not be stored at home. NOTE: This sheet is a summary. It may not cover all possible information. If you have questions about this medicine, talk to your doctor, pharmacist, or health care  provider.  2022 Elsevier/Gold Standard (2019-11-06 13:37:23)  

## 2021-08-04 NOTE — Progress Notes (Signed)
Hematology/Oncology progress note [facility] Telephone:([phone] [phone]   Patient Care Team: Renee Rival, NP as PCP - General (Nurse Practitioner)  REFERRING PROVIDER: Renee Rival, NP  CHIEF COMPLAINTS/REASON FOR VISIT:  Follow-up for acute triple negative breast cancer  HISTORY OF PRESENTING ILLNESS:   Peggy Bates is a  55 y.o.  female with PMH listed below was seen in consultation at the request of  Renee Rival, NP  for evaluation of triple negative breast cancer.  03/30/2021, screening mammogram with 3D 2 nodular areas of asymmetric density demonstrated within the superior lateral aspect of the left breast middle third depth.  Finding was best appreciated on 3D tomosynthesis imaging. Targeted ultrasound showed left upper outer breast 1:30 position 1.1 cm round hypoechoic mass with angular margins. 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 Bary Castilla yesterday.  She presents to establish care with me today Patient did not notice this left mass prior to the mammogram.   Case was discussed on Tumor board.  Pathology reports and mammogram/ultrasound were done at outside facility and results were reviewed and discussed with patient and her husband.  LN status was not mentioned on her [place]. Recommend additional images with mammogram and MRI, neoadjuvant chemotherapy  Family history of breast cancer: Breast cancer in her sister, her late 60s, early 50s; and being paternal grandmother Menarche: 86 Age at first live childbirth: 56  patient has 2 sons and 1 daughter.  Daughter has passed away Used OCP:  Used estrogen and progesterone therapy: Denies History of Radiation to the chest: Denies Previous of breast biopsy: Denies  /25/2022 diagnostic mammogram of left breast showed 2.1 cm biopsy-proven  malignancy in the lateral left breast at 2:00, 4 cm from nipple.  Directly adjacent cystic mass, 1.5 cm in size, consistent with biopsy hematoma.  1 abnormal and 1 borderline abnormal left axillary lymph node  05/18/2021 bilateral breast MRI with and without contrast showed 2.5 x 3 x 2.5 cm hematoma containing biopsy clip artifact within the upper outer left breast.  1.6 cm nodular non-mass-like enhancement 2.5 cm posterior/superior to the biopsy clip may represent biopsy-proven malignancy or additional areas of malignancy.  Single abnormal appearing left axillary lymph node with eccentric cortical thickening.  No MRI evidence of the right breast malignancy.  #05/07/2021, Mediport was placed by Dr. Bary Castilla #05/14/2021 echocardiogram showed LVEF 60-55%  05/20/2021 Slide consultation of her left breast biopsy from outside institution - invasive mammary carcinoma, no special type, grade 3, DCIS and LVI not identified. ER negative, PR negative, HER2 IHC negative. Ki 67 70-80%    05/20/2021 ultrasound-guided left axillary lymph node was negative for malignancy.  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 D15- not done due to cytopenia  INTERVAL HISTORY Peggy Bates is a 55 y.o. female who has above history reviewed by me today presents for follow up visit for management of left triple negative breast cancer Problems and complaints are listed below: Patient is on neoadjuvant chemotherapy She tolerates well.  Patient has experienced 1 episode of lower back pain, radiated to the front during interval.  She took Tylenol with some improvement.  Neuropathy, primarily left foot and fingertips are worse.  Patient  takes gabapentin 600 mg for mood disorder. No nausea vomiting diarrhea.   Review of Systems  Constitutional:   Negative for appetite change, chills, fatigue and fever.  HENT:   Negative for hearing loss and voice change.   Eyes:  Negative for eye problems.  Respiratory:  Negative for chest tightness and cough.   Cardiovascular:  Negative for chest pain.  Gastrointestinal:  Negative for abdominal distention, abdominal pain and blood in stool.  Endocrine: Negative for hot flashes.  Genitourinary:  Negative for difficulty urinating and frequency.   Musculoskeletal:  Negative for arthralgias.  Skin:  Negative for itching and rash.  Neurological:  Positive for numbness. Negative for extremity weakness.  Hematological:  Negative for adenopathy.  Psychiatric/Behavioral:  Negative for confusion.    MEDICAL HISTORY:  Past Medical History:  Diagnosis Date   Arthritis    knees   COVID-19 12/2020   Depression    Family history of breast cancer    Goiter    Malignant neoplasm of left female breast ([place]) 04/07/2021    SURGICAL HISTORY: Past Surgical History:  Procedure Laterality Date   BREAST BIOPSY Left 04/07/2021   [place] in [place]:u/s bx triple neg   BREAST BIOPSY Left 05/20/2021   [place] Axilla Bx, hydro 3 marker, path pending    KNEE ARTHROSCOPY     PORTACATH PLACEMENT Right 05/07/2021   Procedure: INSERTION PORT-A-CATH;  Surgeon: Robert Bellow, MD;  Location: [facility] ORS;  Service: General;  Laterality: Right;   TOOTH EXTRACTION     TRANSCERVICAL UTERINE FIBROID(S) ABLATION  2010    SOCIAL HISTORY: Social History   Socioeconomic History   Marital status: Married    Spouse name: Not on file   Number of children: Not on file   Years of education: Not on file   Highest education level: Not on file  Occupational History   Not on file  Tobacco Use   Smoking status: Never   Smokeless tobacco: Never  Vaping Use   Vaping Use: Never used  Substance and Sexual Activity   Alcohol use: Never   Drug use: Never   Sexual activity: Yes  Other Topics Concern   Not on file   Social History Narrative   ** Merged History Encounter **       Social Determinants of Health   Financial Resource Strain: Not on file  Food Insecurity: Not on file  Transportation Needs: Not on file  Physical Activity: Not on file  Stress: Not on file  Social Connections: Not on file  Intimate Partner Violence: Not on file    FAMILY HISTORY: Family History  Problem Relation Age of Onset   Breast cancer Paternal Grandmother    Breast cancer Sister        dx 49s, recurrence x3   Diabetes Sister    Diabetes Father    Throat cancer Maternal Grandmother     ALLERGIES:  has No Known Allergies.  MEDICATIONS:  Current Outpatient Medications  Medication Sig Dispense Refill   chlorhexidine (PERIDEX) 0.12 % solution Use as directed 10 mLs in the mouth or throat 2 (two) times daily. 473 mL 2   cholecalciferol (VITAMIN D) 25 MCG (1000 UNIT) tablet Take 1,000 Units by mouth daily.     dexamethasone (DECADRON) 4 MG tablet Take 2 tablets once a day for 3 days after carboplatin and AC chemotherapy. Take with food. 30 tablet 1   diclofenac Sodium (VOLTAREN) 1 % GEL Apply 1 application topically daily.  gabapentin (NEURONTIN) 600 MG tablet Take 600 mg by mouth every morning.     Glucosamine-Chondroitin (MOVE FREE PO) Take 1 tablet by mouth daily.     levonorgestrel (MIRENA) 20 MCG/DAY IUD 1 each by Intrauterine route once.     lidocaine-prilocaine (EMLA) cream Apply to affected area once 30 g 3   meloxicam (MOBIC) 15 MG tablet Take 15 mg by mouth daily.     ondansetron (ZOFRAN) 8 MG tablet Take 1 tablet (8 mg total) by mouth 2 (two) times daily as needed. Start on the third day after carboplatin and AC chemotherapy. (Patient not taking: No sig reported) 30 tablet 1   phentermine (ADIPEX-P) 37.5 MG tablet 1 tablet (Patient not taking: No sig reported)     prochlorperazine (COMPAZINE) 10 MG tablet Take 1 tablet (10 mg total) by mouth every 6 (six) hours as needed (Nausea or vomiting).  (Patient not taking: No sig reported) 30 tablet 1   No current facility-administered medications for this visit.     PHYSICAL EXAMINATION: ECOG PERFORMANCE STATUS: 0 - Asymptomatic Vitals:   08/04/21 1056  BP: 111/67  Pulse: 92  Temp: 98.2 F (36.8 C)  SpO2: 98%   Filed Weights   08/04/21 1056  Weight: 234 lb 4.8 oz (106.3 kg)    Physical Exam Constitutional:      General: She is not in acute distress. HENT:     Head: Normocephalic and atraumatic.  Eyes:     General: No scleral icterus. Cardiovascular:     Rate and Rhythm: Normal rate and regular rhythm.     Heart sounds: Normal heart sounds.  Pulmonary:     Effort: Pulmonary effort is normal. No respiratory distress.     Breath sounds: No wheezing.  Abdominal:     General: Bowel sounds are normal. There is no distension.     Palpations: Abdomen is soft.  Musculoskeletal:        General: No deformity. Normal range of motion.     Cervical back: Normal range of motion and neck supple.  Skin:    General: Skin is warm and dry.     Findings: No erythema or rash.  Neurological:     Mental Status: She is alert and oriented to person, place, and time. Mental status is at baseline.     Cranial Nerves: No cranial nerve deficit.     Coordination: Coordination normal.  Psychiatric:        Mood and Affect: Mood normal.     LABORATORY DATA:  I have reviewed the data as listed Lab Results  Component Value Date   WBC 2.6 (L) 08/04/2021   HGB 11.8 (L) 08/04/2021   HCT 36.7 08/04/2021   MCV 86.8 08/04/2021   PLT 110 (L) 08/04/2021   Recent Labs    07/21/21 0838 07/28/21 0939 08/04/21 1039  NA 135 134* 136  K 4.3 4.3 4.5  CL 105 102 103  CO2 _0 GLUCOSE 97 110* 104*  BUN 19 21* 18  CREATININE 0.68 0.67 0.76  CALCIUM 9.0 9.1 9.2  GFRNONAA >60 >60 >60  PROT 7.4 7.7 7.3  ALBUMIN 4.1 4.3 4.2  AST _1 ALT 39 28 36  ALKPHOS 84 85 92  BILITOT 0.7 0.5 0.8    Iron/TIBC/Ferritin/ %Sat No results  found for: IRON, TIBC, FERRITIN, IRONPCTSAT    RADIOGRAPHIC STUDIES: I have personally reviewed the radiological images as listed and agreed with the findings in the report. No results  found.    ASSESSMENT & PLAN:  1. Carcinoma of upper-outer quadrant of left breast in female, estrogen receptor negative ([place])   2. Encounter for antineoplastic chemotherapy   3. 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  Proceed with cycle 3-day 15 carboplatin/Taxol. Patient gets G-CSF support on day 16 and day 17. Recommend patient to take Claritin for 4 days  #Back pain, patient prefers to defer imaging studies.  Will monitor clinically. #Therapy induced neutropenia, stable continue close monitor.  She receives short acting G-CSF support.Peridex solution swish and spit BID for prophylaxis.  #Transaminitis, resolved. #Chemotherapy-induced neuropathy, patient is currently on gabapentin 600 mg daily for mood disorder.  She prefers not to further increase gabapentin dosage or add additional agents for neuropathy.  Continue monitor closely.  Reduce Taxol to 70 mg/metered square.  Supportive care measures are necessary for patient well-being and will be provided as necessary. We spent sufficient time to discuss many aspect of care, questions were answered to patient's satisfaction.  All questions were answered. The patient knows to call the clinic with any problems questions or concerns.  cc Renee Rival, NP    Return of visit: 1 week, lab  MD Doristine Church AUC 2 with Taxol    Earlie Server, MD, [name] Hematology Oncology [facility] Pager- [phone] 08/04/2021

## 2021-08-05 ENCOUNTER — Inpatient Hospital Stay

## 2021-08-05 DIAGNOSIS — Z171 Estrogen receptor negative status [ER-]: Secondary | ICD-10-CM

## 2021-08-05 DIAGNOSIS — Z5112 Encounter for antineoplastic immunotherapy: Secondary | ICD-10-CM | POA: Diagnosis not present

## 2021-08-05 DIAGNOSIS — C50412 Malignant neoplasm of upper-outer quadrant of left female breast: Secondary | ICD-10-CM

## 2021-08-05 LAB — T4: T4, Total: 8.1 ug/dL (ref 4.5–12.0)

## 2021-08-05 MED ORDER — FILGRASTIM-SNDZ 480 MCG/0.8ML IJ SOSY
480.0000 ug | PREFILLED_SYRINGE | Freq: Once | INTRAMUSCULAR | Status: AC
  Administered 2021-08-05: 480 ug via SUBCUTANEOUS
  Filled 2021-08-05: qty 0.8

## 2021-08-06 ENCOUNTER — Inpatient Hospital Stay

## 2021-08-06 DIAGNOSIS — Z171 Estrogen receptor negative status [ER-]: Secondary | ICD-10-CM

## 2021-08-06 DIAGNOSIS — Z5112 Encounter for antineoplastic immunotherapy: Secondary | ICD-10-CM | POA: Diagnosis not present

## 2021-08-06 MED ORDER — FILGRASTIM-SNDZ 480 MCG/0.8ML IJ SOSY
480.0000 ug | PREFILLED_SYRINGE | Freq: Once | INTRAMUSCULAR | Status: AC
  Administered 2021-08-06: 480 ug via SUBCUTANEOUS
  Filled 2021-08-06: qty 0.8

## 2021-08-11 ENCOUNTER — Inpatient Hospital Stay

## 2021-08-11 ENCOUNTER — Encounter: Payer: Self-pay | Admitting: Oncology

## 2021-08-11 ENCOUNTER — Inpatient Hospital Stay (HOSPITAL_BASED_OUTPATIENT_CLINIC_OR_DEPARTMENT_OTHER): Admitting: Oncology

## 2021-08-11 VITALS — BP 114/80 | HR 80 | Temp 97.6°F | Resp 18 | Wt 233.0 lb

## 2021-08-11 DIAGNOSIS — D701 Agranulocytosis secondary to cancer chemotherapy: Secondary | ICD-10-CM | POA: Diagnosis not present

## 2021-08-11 DIAGNOSIS — Z5111 Encounter for antineoplastic chemotherapy: Secondary | ICD-10-CM

## 2021-08-11 DIAGNOSIS — T451X5D Adverse effect of antineoplastic and immunosuppressive drugs, subsequent encounter: Secondary | ICD-10-CM

## 2021-08-11 DIAGNOSIS — Z171 Estrogen receptor negative status [ER-]: Secondary | ICD-10-CM | POA: Diagnosis not present

## 2021-08-11 DIAGNOSIS — C50412 Malignant neoplasm of upper-outer quadrant of left female breast: Secondary | ICD-10-CM | POA: Diagnosis not present

## 2021-08-11 DIAGNOSIS — G620 Drug-induced polyneuropathy: Secondary | ICD-10-CM

## 2021-08-11 DIAGNOSIS — Z95828 Presence of other vascular implants and grafts: Secondary | ICD-10-CM

## 2021-08-11 DIAGNOSIS — T451X5A Adverse effect of antineoplastic and immunosuppressive drugs, initial encounter: Secondary | ICD-10-CM

## 2021-08-11 DIAGNOSIS — Z5112 Encounter for antineoplastic immunotherapy: Secondary | ICD-10-CM | POA: Diagnosis not present

## 2021-08-11 LAB — COMPREHENSIVE METABOLIC PANEL
ALT: 51 U/L — ABNORMAL HIGH (ref 0–44)
AST: 33 U/L (ref 15–41)
Albumin: 4.4 g/dL (ref 3.5–5.0)
Alkaline Phosphatase: 86 U/L (ref 38–126)
Anion gap: 7 (ref 5–15)
BUN: 22 mg/dL — ABNORMAL HIGH (ref 6–20)
CO2: 24 mmol/L (ref 22–32)
Calcium: 9.4 mg/dL (ref 8.9–10.3)
Chloride: 104 mmol/L (ref 98–111)
Creatinine, Ser: 0.8 mg/dL (ref 0.44–1.00)
GFR, Estimated: >60 mL/min (ref >60)
Glucose, Bld: 100 mg/dL — ABNORMAL HIGH (ref 70–99)
Potassium: 4.3 mmol/L (ref 3.5–5.1)
Sodium: 135 mmol/L (ref 135–145)
Total Bilirubin: 0.3 mg/dL (ref 0.3–1.2)
Total Protein: 7.8 g/dL (ref 6.5–8.1)

## 2021-08-11 LAB — CBC WITH DIFFERENTIAL/PLATELET
Abs Immature Granulocytes: 0.06 10*3/uL (ref 0.00–0.07)
Basophils Absolute: 0 10*3/uL (ref 0.0–0.1)
Basophils Relative: 1 %
Eosinophils Absolute: 0 10*3/uL (ref 0.0–0.5)
Eosinophils Relative: 0 %
HCT: 36.4 % (ref 36.0–46.0)
Hemoglobin: 11.8 g/dL — ABNORMAL LOW (ref 12.0–15.0)
Immature Granulocytes: 2 %
Lymphocytes Relative: 50 %
Lymphs Abs: 1.5 10*3/uL (ref 0.7–4.0)
MCH: 28.2 pg (ref 26.0–34.0)
MCHC: 32.4 g/dL (ref 30.0–36.0)
MCV: 86.9 fL (ref 80.0–100.0)
Monocytes Absolute: 0.4 10*3/uL (ref 0.1–1.0)
Monocytes Relative: 14 %
Neutro Abs: 1 10*3/uL — ABNORMAL LOW (ref 1.7–7.7)
Neutrophils Relative %: 33 %
Platelets: 326 10*3/uL (ref 150–400)
RBC: 4.19 MIL/uL (ref 3.87–5.11)
RDW: 15.4 % (ref 11.5–15.5)
WBC: 2.9 10*3/uL — ABNORMAL LOW (ref 4.0–10.5)
nRBC: 0 % (ref 0.0–0.2)

## 2021-08-11 LAB — TSH: TSH: 0.645 u[IU]/mL (ref 0.350–4.500)

## 2021-08-11 MED ORDER — SODIUM CHLORIDE 0.9 % IV SOLN
Freq: Once | INTRAVENOUS | Status: AC
  Filled 2021-08-11: qty 250

## 2021-08-11 MED ORDER — HEPARIN SOD (PORK) LOCK FLUSH 100 UNIT/ML IV SOLN
500.0000 [IU] | Freq: Once | INTRAVENOUS | Status: DC
  Filled 2021-08-11: qty 5

## 2021-08-11 MED ORDER — FAMOTIDINE 20 MG IN NS 100 ML IVPB
20.0000 mg | Freq: Once | INTRAVENOUS | Status: AC
  Administered 2021-08-11: 20 mg via INTRAVENOUS
  Filled 2021-08-11: qty 100
  Filled 2021-08-11: qty 20

## 2021-08-11 MED ORDER — SODIUM CHLORIDE 0.9 % IV SOLN
200.0000 mg | Freq: Once | INTRAVENOUS | Status: AC
  Administered 2021-08-11: 200 mg via INTRAVENOUS
  Filled 2021-08-11: qty 8

## 2021-08-11 MED ORDER — SODIUM CHLORIDE 0.9 % IV SOLN
300.0000 mg | Freq: Once | INTRAVENOUS | Status: AC
  Administered 2021-08-11: 300 mg via INTRAVENOUS
  Filled 2021-08-11: qty 30

## 2021-08-11 MED ORDER — DIPHENHYDRAMINE HCL 50 MG/ML IJ SOLN
50.0000 mg | Freq: Once | INTRAMUSCULAR | Status: AC
  Administered 2021-08-11: 50 mg via INTRAVENOUS

## 2021-08-11 MED ORDER — PALONOSETRON HCL INJECTION 0.25 MG/5ML
0.2500 mg | Freq: Once | INTRAVENOUS | Status: AC
  Administered 2021-08-11: 0.25 mg via INTRAVENOUS

## 2021-08-11 MED ORDER — SODIUM CHLORIDE 0.9 % IV SOLN
10.0000 mg | Freq: Once | INTRAVENOUS | Status: AC
  Administered 2021-08-11: 10 mg via INTRAVENOUS
  Filled 2021-08-11: qty 10

## 2021-08-11 MED ORDER — SODIUM CHLORIDE 0.9% FLUSH
10.0000 mL | Freq: Once | INTRAVENOUS | Status: AC
  Administered 2021-08-11: 10 mL via INTRAVENOUS
  Filled 2021-08-11: qty 10

## 2021-08-11 MED ORDER — SODIUM CHLORIDE 0.9 % IV SOLN
70.0000 mg/m2 | Freq: Once | INTRAVENOUS | Status: AC
  Administered 2021-08-11: 150 mg via INTRAVENOUS
  Filled 2021-08-11: qty 25

## 2021-08-11 MED ORDER — HEPARIN SOD (PORK) LOCK FLUSH 100 UNIT/ML IV SOLN
INTRAVENOUS | Status: AC
  Administered 2021-08-11: 500 [IU]
  Filled 2021-08-11: qty 5

## 2021-08-11 MED ORDER — SODIUM CHLORIDE 0.9 % IV SOLN
150.0000 mg | Freq: Once | INTRAVENOUS | Status: AC
  Administered 2021-08-11: 150 mg via INTRAVENOUS
  Filled 2021-08-11: qty 150

## 2021-08-11 NOTE — Progress Notes (Signed)
Patient here for oncology follow-up appointment,  no new concerns at this time.    

## 2021-08-11 NOTE — Progress Notes (Signed)
Hematology/Oncology progress note [facility] Telephone:([phone] Fax:([phone]   Patient Care Team: Renee Rival, NP as PCP - General (Nurse Practitioner)  REFERRING PROVIDER: Renee Rival, NP  CHIEF COMPLAINTS/REASON FOR VISIT:  Follow-up for acute triple negative breast cancer  HISTORY OF PRESENTING ILLNESS:   Peggy Bates is a  55 y.o.  female with PMH listed below was seen in consultation at the request of  Renee Rival, NP  for evaluation of triple negative breast cancer.  03/30/2021, screening mammogram with 3D 2 nodular areas of asymmetric density demonstrated within the superior lateral aspect of the left breast middle third depth.  Finding was best appreciated on 3D tomosynthesis imaging. Targeted ultrasound showed left upper outer breast 1:30 position 1.1 cm round hypoechoic mass with angular margins. 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 Bary Castilla yesterday.  She presents to establish care with me today Patient did not notice this left mass prior to the mammogram.   Case was discussed on Tumor board.  Pathology reports and mammogram/ultrasound were done at outside facility and results were reviewed and discussed with patient and her husband.  LN status was not mentioned on her [place]. Recommend additional images with mammogram and MRI, neoadjuvant chemotherapy  Family history of breast cancer: Breast cancer in her sister, her late 28s, early 78s; and being paternal grandmother Menarche: 86 Age at first live childbirth: 46  patient has 2 sons and 1 daughter.  Daughter has passed away Used OCP:  Used estrogen and progesterone therapy: Denies History of Radiation to the chest: Denies Previous of breast biopsy: Denies  /25/2022 diagnostic mammogram of left breast showed 2.1 cm biopsy-proven  malignancy in the lateral left breast at 2:00, 4 cm from nipple.  Directly adjacent cystic mass, 1.5 cm in size, consistent with biopsy hematoma.  1 abnormal and 1 borderline abnormal left axillary lymph node  05/18/2021 bilateral breast MRI with and without contrast showed 2.5 x 3 x 2.5 cm hematoma containing biopsy clip artifact within the upper outer left breast.  1.6 cm nodular non-mass-like enhancement 2.5 cm posterior/superior to the biopsy clip may represent biopsy-proven malignancy or additional areas of malignancy.  Single abnormal appearing left axillary lymph node with eccentric cortical thickening.  No MRI evidence of the right breast malignancy.  #05/07/2021, Mediport was placed by Dr. Bary Castilla #05/14/2021 echocardiogram showed LVEF 60-55%  05/20/2021 Slide consultation of her left breast biopsy from outside institution - invasive mammary carcinoma, no special type, grade 3, DCIS and LVI not identified. ER negative, PR negative, HER2 IHC negative. Ki 67 70-80%    05/20/2021 ultrasound-guided left axillary lymph node was negative for malignancy.  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 D15- not done due to cytopenia  INTERVAL HISTORY Peggy Bates is a 55 y.o. female who has above history reviewed by me today presents for follow up visit for management of left triple negative breast cancer Problems and complaints are listed below: Patient is on neoadjuvant chemotherapy Patient reports that she has been doing well. Denies any new complaints. Neuropathy, left foot and fingertips.  Patient takes gabapentin 600 mg daily-mood disorder.  No nausea vomiting diarrhea.  No fever or chills . Review  of Systems  Constitutional:  Negative for appetite change, chills, fatigue and fever.  HENT:   Negative for  hearing loss and voice change.   Eyes:  Negative for eye problems.  Respiratory:  Negative for chest tightness and cough.   Cardiovascular:  Negative for chest pain.  Gastrointestinal:  Negative for abdominal distention, abdominal pain and blood in stool.  Endocrine: Negative for hot flashes.  Genitourinary:  Negative for difficulty urinating and frequency.   Musculoskeletal:  Negative for arthralgias.  Skin:  Negative for itching and rash.  Neurological:  Positive for numbness. Negative for extremity weakness.  Hematological:  Negative for adenopathy.  Psychiatric/Behavioral:  Negative for confusion.    MEDICAL HISTORY:  Past Medical History:  Diagnosis Date   Arthritis    knees   COVID-19 12/2020   Depression    Family history of breast cancer    Goiter    Malignant neoplasm of left female breast ([place]) 04/07/2021    SURGICAL HISTORY: Past Surgical History:  Procedure Laterality Date   BREAST BIOPSY Left 04/07/2021   [place] in [place]:u/s bx triple neg   BREAST BIOPSY Left 05/20/2021   [place] Axilla Bx, hydro 3 marker, path pending    KNEE ARTHROSCOPY     PORTACATH PLACEMENT Right 05/07/2021   Procedure: INSERTION PORT-A-CATH;  Surgeon: Robert Bellow, MD;  Location: [facility] ORS;  Service: General;  Laterality: Right;   TOOTH EXTRACTION     TRANSCERVICAL UTERINE FIBROID(S) ABLATION  2010    SOCIAL HISTORY: Social History   Socioeconomic History   Marital status: Married    Spouse name: Not on file   Number of children: Not on file   Years of education: Not on file   Highest education level: Not on file  Occupational History   Not on file  Tobacco Use   Smoking status: Never   Smokeless tobacco: Never  Vaping Use   Vaping Use: Never used  Substance and Sexual Activity   Alcohol use: Never   Drug use: Never   Sexual activity: Yes  Other Topics Concern   Not on file  Social History Narrative   ** Merged History Encounter **       Social  Determinants of Health   Financial Resource Strain: Not on file  Food Insecurity: Not on file  Transportation Needs: Not on file  Physical Activity: Not on file  Stress: Not on file  Social Connections: Not on file  Intimate Partner Violence: Not on file    FAMILY HISTORY: Family History  Problem Relation Age of Onset   Breast cancer Paternal Grandmother    Breast cancer Sister        dx 2s, recurrence x3   Diabetes Sister    Diabetes Father    Throat cancer Maternal Grandmother     ALLERGIES:  has No Known Allergies.  MEDICATIONS:  Current Outpatient Medications  Medication Sig Dispense Refill   chlorhexidine (PERIDEX) 0.12 % solution Use as directed 10 mLs in the mouth or throat 2 (two) times daily. 473 mL 2   cholecalciferol (VITAMIN D) 25 MCG (1000 UNIT) tablet Take 1,000 Units by mouth daily.     dexamethasone (DECADRON) 4 MG tablet Take 2 tablets once a day for 3 days after carboplatin and AC chemotherapy. Take with food. 30 tablet 1   diclofenac Sodium (VOLTAREN) 1 % GEL Apply 1 application topically daily.     gabapentin (NEURONTIN) 600 MG tablet Take 600 mg by mouth every morning.  Glucosamine-Chondroitin (MOVE FREE PO) Take 1 tablet by mouth daily.     levonorgestrel (MIRENA) 20 MCG/DAY IUD 1 each by Intrauterine route once.     lidocaine-prilocaine (EMLA) cream Apply to affected area once 30 g 3   meloxicam (MOBIC) 15 MG tablet Take 15 mg by mouth daily.     ondansetron (ZOFRAN) 8 MG tablet Take 1 tablet (8 mg total) by mouth 2 (two) times daily as needed. Start on the third day after carboplatin and AC chemotherapy. (Patient not taking: No sig reported) 30 tablet 1   phentermine (ADIPEX-P) 37.5 MG tablet 1 tablet (Patient not taking: No sig reported)     prochlorperazine (COMPAZINE) 10 MG tablet Take 1 tablet (10 mg total) by mouth every 6 (six) hours as needed (Nausea or vomiting). (Patient not taking: No sig reported) 30 tablet 1   No current  facility-administered medications for this visit.     PHYSICAL EXAMINATION: ECOG PERFORMANCE STATUS: 0 - Asymptomatic Vitals:   08/11/21 0945  BP: 114/80  Pulse: 80  Resp: 18  Temp: 97.6 F (36.4 C)  SpO2: 100%   Filed Weights   08/11/21 0945  Weight: 233 lb (105.7 kg)    Physical Exam Constitutional:      General: She is not in acute distress. HENT:     Head: Normocephalic and atraumatic.  Eyes:     General: No scleral icterus. Cardiovascular:     Rate and Rhythm: Normal rate and regular rhythm.     Heart sounds: Normal heart sounds.  Pulmonary:     Effort: Pulmonary effort is normal. No respiratory distress.     Breath sounds: No wheezing.  Abdominal:     General: Bowel sounds are normal. There is no distension.     Palpations: Abdomen is soft.  Musculoskeletal:        General: No deformity. Normal range of motion.     Cervical back: Normal range of motion and neck supple.  Skin:    General: Skin is warm and dry.     Findings: No erythema or rash.  Neurological:     Mental Status: She is alert and oriented to person, place, and time. Mental status is at baseline.     Cranial Nerves: No cranial nerve deficit.     Coordination: Coordination normal.  Psychiatric:        Mood and Affect: Mood normal.   Left breast mass has decreased in size and barely palpable.  LABORATORY DATA:  I have reviewed the data as listed Lab Results  Component Value Date   WBC 2.9 (L) 08/11/2021   HGB 11.8 (L) 08/11/2021   HCT 36.4 08/11/2021   MCV 86.9 08/11/2021   PLT 326 08/11/2021   Recent Labs    07/28/21 0939 08/04/21 1039 08/11/21 0925  NA 134* 136 135  K 4.3 4.5 4.3  CL 102 103 104  CO2 '27 27 24  ' GLUCOSE 110* 104* 100*  BUN 21* 18 22*  CREATININE 0.67 0.76 0.80  CALCIUM 9.1 9.2 9.4  GFRNONAA >60 >60 >60  PROT 7.7 7.3 7.8  ALBUMIN 4.3 4.2 4.4  AST 24 27 33  ALT 28 36 51*  ALKPHOS 85 92 86  BILITOT 0.5 0.8 0.3    Iron/TIBC/Ferritin/ %Sat No results  found for: IRON, TIBC, FERRITIN, IRONPCTSAT    RADIOGRAPHIC STUDIES: I have personally reviewed the radiological images as listed and agreed with the findings in the report. No results found.    ASSESSMENT & PLAN:  1. Encounter for antineoplastic chemotherapy   2. Carcinoma of upper-outer quadrant of left breast in female, estrogen receptor negative ([place])   3. Chemotherapy-induced neutropenia (HCC)   4. Chemotherapy-induced neuropathy ([place])   .Cancer Staging Carcinoma of upper-outer quadrant of left breast in female, estrogen receptor negative ([place]) Staging form: Breast, AJCC 8th Edition - Clinical stage from 04/29/2021: Stage IIB (cT2, cN0, cM0, G3, ER-, PR-, HER2-) - Signed by Earlie Server, MD on 06/04/2021   #Left breast invasive mammary carcinoma, triple negative, + high-grade DCIS ER positive cT2 N0, stage IIB Labs are reviewed and discussed with patient Proceed with cycle 4, Keytruda, carboplatin, Taxol. Patient gets G-CSF support on day 2 and day 3. Claritin for 4 days  #Back pain, patient prefers to defer imaging studies.  Symptoms are stable. #Therapy induced neutropenia, stable continue close monitor.  She receives short acting G-CSF support.Peridex solution swish and spit BID for prophylaxis.  #Transaminitis, resolved. #Chemotherapy-induced neuropathy, patient is currently on gabapentin 600 mg daily for mood disorder.  She prefers not to further increase gabapentin dosage or add additional agents for neuropathy.  Continue monitor closely.  Reduce Taxol to 70 mg/metered square.  Supportive care measures are necessary for patient well-being and will be provided as necessary. We spent sufficient time to discuss many aspect of care, questions were answered to patient's satisfaction.  All questions were answered. The patient knows to call the clinic with any problems questions or concerns.  cc Renee Rival, NP    Return of visit: 1 week, lab MD Doristine Church AUC 2  with Taxol    Earlie Server, MD, [name] Hematology Oncology [facility] Pager- [phone] 08/11/2021

## 2021-08-11 NOTE — Patient Instructions (Signed)
[facility]  Discharge Instructions: Thank you for choosing [facility] to provide your oncology and hematology care.  If you have a lab appointment with the [facility], please go directly to the [facility] and check in at the registration area.  Wear comfortable clothing and clothing appropriate for easy access to any Portacath or PICC line.   We strive to give you quality time with your provider. You may need to reschedule your appointment if you arrive late (15 or more minutes).  Arriving late affects you and other patients whose appointments are after yours.  Also, if you miss three or more appointments without notifying the office, you may be dismissed from the clinic at the provider's discretion.      For prescription refill requests, have your pharmacy contact our office and allow 72 hours for refills to be completed.    Today you received the following chemotherapy and/or immunotherapy agents Keytruda, Taxol, carboplatin      To help prevent nausea and vomiting after your treatment, we encourage you to take your nausea medication as directed.  BELOW ARE SYMPTOMS THAT SHOULD BE REPORTED IMMEDIATELY: *FEVER GREATER THAN 100.4 F (38 C) OR HIGHER *CHILLS OR SWEATING *NAUSEA AND VOMITING THAT IS NOT CONTROLLED WITH YOUR NAUSEA MEDICATION *UNUSUAL SHORTNESS OF BREATH *UNUSUAL BRUISING OR BLEEDING *URINARY PROBLEMS (pain or burning when urinating, or frequent urination) *BOWEL PROBLEMS (unusual diarrhea, constipation, pain near the anus) TENDERNESS IN MOUTH AND THROAT WITH OR WITHOUT PRESENCE OF ULCERS (sore throat, sores in mouth, or a toothache) UNUSUAL RASH, SWELLING OR PAIN  UNUSUAL VAGINAL DISCHARGE OR ITCHING   Items with * indicate a potential emergency and should be followed up as soon as possible or go to the Emergency Department if any problems should occur.  Please show the CHEMOTHERAPY ALERT CARD or IMMUNOTHERAPY ALERT  CARD at check-in to the Emergency Department and triage nurse.  Should you have questions after your visit or need to cancel or reschedule your appointment, please contact [facility]  [phone] and follow the prompts.  Office hours are 8:00 a.m. to 4:30 p.m. Monday - Friday. Please note that voicemails left after 4:00 p.m. may not be returned until the following business day.  We are closed weekends and major holidays. You have access to a nurse at all times for urgent questions. Please call the main number to the clinic [phone] and follow the prompts.  For any non-urgent questions, you may also contact your provider using MyChart. We now offer e-Visits for anyone 16 and older to request care online for non-urgent symptoms. For details visit [email or web address].   Also download the MyChart app! Go to the app store, search "MyChart", open the app, select [facility], and log in with your MyChart username and password.  Due to Covid, a mask is required upon entering the hospital/clinic. If you do not have a mask, one will be given to you upon arrival. For doctor visits, patients may have 1 support person aged 54 or older with them. For treatment visits, patients cannot have anyone with them due to current Covid guidelines and our immunocompromised population.   Pembrolizumab injection What is this medication? PEMBROLIZUMAB (pem broe liz ue mab) is a monoclonal antibody. It is used totreat certain types of cancer. This medicine may be used for other purposes; ask your health care provider orpharmacist if you have questions. COMMON BRAND NAME(S): Keytruda What should I tell my care team  before I take this medication? They need to know if you have any of these conditions: autoimmune diseases like Crohn's disease, ulcerative colitis, or lupus have had or planning to have an allogeneic stem cell transplant (uses someone else's stem cells) history of organ  transplant history of chest radiation nervous system problems like myasthenia gravis or Guillain-Barre syndrome an unusual or allergic reaction to pembrolizumab, other medicines, foods, dyes, or preservatives pregnant or trying to get pregnant breast-feeding How should I use this medication? This medicine is for infusion into a vein. It is given by a health careprofessional in a hospital or clinic setting. A special MedGuide will be given to you before each treatment. Be sure to readthis information carefully each time. Talk to your pediatrician regarding the use of this medicine in children. While this drug may be prescribed for children as young as 6 months for selectedconditions, precautions do apply. Overdosage: If you think you have taken too much of this medicine contact apoison control center or emergency room at once. NOTE: This medicine is only for you. Do not share this medicine with others. What if I miss a dose? It is important not to miss your dose. Call your doctor or health careprofessional if you are unable to keep an appointment. What may interact with this medication? Interactions have not been studied. This list may not describe all possible interactions. Give your health care provider a list of all the medicines, herbs, non-prescription drugs, or dietary supplements you use. Also tell them if you smoke, drink alcohol, or use illegaldrugs. Some items may interact with your medicine. What should I watch for while using this medication? Your condition will be monitored carefully while you are receiving thismedicine. You may need blood work done while you are taking this medicine. Do not become pregnant while taking this medicine or for 4 months after stopping it. Women should inform their doctor if they wish to become pregnant or think they might be pregnant. There is a potential for serious side effects to an unborn child. Talk to your health care professional or pharmacist for  more information. Do not breast-feed an infant while taking this medicine orfor 4 months after the last dose. What side effects may I notice from receiving this medication? Side effects that you should report to your doctor or health care professionalas soon as possible: allergic reactions like skin rash, itching or hives, swelling of the face, lips, or tongue bloody or black, tarry breathing problems changes in vision chest pain chills confusion constipation cough diarrhea dizziness or feeling faint or lightheaded fast or irregular heartbeat fever flushing joint pain low blood counts - this medicine may decrease the number of white blood cells, red blood cells and platelets. You may be at increased risk for infections and bleeding. muscle pain muscle weakness pain, tingling, numbness in the hands or feet persistent headache redness, blistering, peeling or loosening of the skin, including inside the mouth signs and symptoms of high blood sugar such as dizziness; dry mouth; dry skin; fruity breath; nausea; stomach pain; increased hunger or thirst; increased urination signs and symptoms of kidney injury like trouble passing urine or change in the amount of urine signs and symptoms of liver injury like dark urine, light-colored stools, loss of appetite, nausea, right upper belly pain, yellowing of the eyes or skin sweating swollen lymph nodes weight loss Side effects that usually do not require medical attention (report to yourdoctor or health care professional if they continue or are bothersome): decreased  appetite hair loss tiredness This list may not describe all possible side effects. Call your doctor for medical advice about side effects. You may report side effects to FDA at1-800-FDA-1088. Where should I keep my medication? This drug is given in a hospital or clinic and will not be stored at home. NOTE: This sheet is a summary. It may not cover all possible information. If you  have questions about this medicine, talk to your doctor, pharmacist, orhealth care provider.  2022 Elsevier/Gold Standard (2019-11-06 21:44:53)  Paclitaxel injection What is this medication? PACLITAXEL (PAK li TAX el) is a chemotherapy drug. It targets fast dividing cells, like cancer cells, and causes these cells to die. This medicine is used to treat ovarian cancer, breast cancer, lung cancer, Kaposi's sarcoma, andother cancers. This medicine may be used for other purposes; ask your health care provider orpharmacist if you have questions. COMMON BRAND NAME(S): Onxol, Taxol What should I tell my care team before I take this medication? They need to know if you have any of these conditions: history of irregular heartbeat liver disease low blood counts, like low white cell, platelet, or red cell counts lung or breathing disease, like asthma tingling of the fingers or toes, or other nerve disorder an unusual or allergic reaction to paclitaxel, alcohol, polyoxyethylated castor oil, other chemotherapy, other medicines, foods, dyes, or preservatives pregnant or trying to get pregnant breast-feeding How should I use this medication? This drug is given as an infusion into a vein. It is administered in a hospitalor clinic by a specially trained health care professional. Talk to your pediatrician regarding the use of this medicine in children.Special care may be needed. Overdosage: If you think you have taken too much of this medicine contact apoison control center or emergency room at once. NOTE: This medicine is only for you. Do not share this medicine with others. What if I miss a dose? It is important not to miss your dose. Call your doctor or health careprofessional if you are unable to keep an appointment. What may interact with this medication? Do not take this medicine with any of the following medications: live virus vaccines This medicine may also interact with the following  medications: antiviral medicines for hepatitis, HIV or AIDS certain antibiotics like erythromycin and clarithromycin certain medicines for fungal infections like ketoconazole and itraconazole certain medicines for seizures like carbamazepine, phenobarbital, phenytoin gemfibrozil nefazodone rifampin St. John's wort This list may not describe all possible interactions. Give your health care provider a list of all the medicines, herbs, non-prescription drugs, or dietary supplements you use. Also tell them if you smoke, drink alcohol, or use illegaldrugs. Some items may interact with your medicine. What should I watch for while using this medication? Your condition will be monitored carefully while you are receiving this medicine. You will need important blood work done while you are taking thismedicine. This medicine can cause serious allergic reactions. To reduce your risk you will need to take other medicine(s) before treatment with this medicine. If you experience allergic reactions like skin rash, itching or hives, swelling of theface, lips, or tongue, tell your doctor or health care professional right away. In some cases, you may be given additional medicines to help with side effects.Follow all directions for their use. This drug may make you feel generally unwell. This is not uncommon, as chemotherapy can affect healthy cells as well as cancer cells. Report any side effects. Continue your course of treatment even though you feel ill unless yourdoctor tells  you to stop. Call your doctor or health care professional for advice if you get a fever, chills or sore throat, or other symptoms of a cold or flu. Do not treat yourself. This drug decreases your body's ability to fight infections. Try toavoid being around people who are sick. This medicine may increase your risk to bruise or bleed. Call your doctor orhealth care professional if you notice any unusual bleeding. Be careful brushing and flossing  your teeth or using a toothpick because you may get an infection or bleed more easily. If you have any dental work done,tell your dentist you are receiving this medicine. Avoid taking products that contain aspirin, acetaminophen, ibuprofen, naproxen, or ketoprofen unless instructed by your doctor. These medicines may hide afever. Do not become pregnant while taking this medicine. Women should inform their doctor if they wish to become pregnant or think they might be pregnant. There is a potential for serious side effects to an unborn child. Talk to your health care professional or pharmacist for more information. Do not breast-feed aninfant while taking this medicine. Men are advised not to father a child while receiving this medicine. This product may contain alcohol. Ask your pharmacist or healthcare provider if this medicine contains alcohol. Be sure to tell all healthcare providers you are taking this medicine. Certain medicines, like metronidazole and disulfiram, can cause an unpleasant reaction when taken with alcohol. The reaction includes flushing, headache, nausea, vomiting, sweating, and increased thirst. Thereaction can last from 30 minutes to several hours. What side effects may I notice from receiving this medication? Side effects that you should report to your doctor or health care professionalas soon as possible: allergic reactions like skin rash, itching or hives, swelling of the face, lips, or tongue breathing problems changes in vision fast, irregular heartbeat high or low blood pressure mouth sores pain, tingling, numbness in the hands or feet signs of decreased platelets or bleeding - bruising, pinpoint red spots on the skin, black, tarry stools, blood in the urine signs of decreased red blood cells - unusually weak or tired, feeling faint or lightheaded, falls signs of infection - fever or chills, cough, sore throat, pain or difficulty passing urine signs and symptoms of liver  injury like dark yellow or brown urine; general ill feeling or flu-like symptoms; light-colored stools; loss of appetite; nausea; right upper belly pain; unusually weak or tired; yellowing of the eyes or skin swelling of the ankles, feet, hands unusually slow heartbeat Side effects that usually do not require medical attention (report to yourdoctor or health care professional if they continue or are bothersome): diarrhea hair loss loss of appetite muscle or joint pain nausea, vomiting pain, redness, or irritation at site where injected tiredness This list may not describe all possible side effects. Call your doctor for medical advice about side effects. You may report side effects to FDA at1-800-FDA-1088. Where should I keep my medication? This drug is given in a hospital or clinic and will not be stored at home. NOTE: This sheet is a summary. It may not cover all possible information. If you have questions about this medicine, talk to your doctor, pharmacist, orhealth care provider.  2022 Elsevier/Gold Standard (2019-11-06 13:37:23)  Carboplatin injection What is this medication? CARBOPLATIN (KAR boe pla tin) is a chemotherapy drug. It targets fast dividing cells, like cancer cells, and causes these cells to die. This medicine is usedto treat ovarian cancer and many other cancers. This medicine may be used for other purposes; ask your  health care provider orpharmacist if you have questions. COMMON BRAND NAME(S): Paraplatin What should I tell my care team before I take this medication? They need to know if you have any of these conditions: blood disorders hearing problems kidney disease recent or ongoing radiation therapy an unusual or allergic reaction to carboplatin, cisplatin, other chemotherapy, other medicines, foods, dyes, or preservatives pregnant or trying to get pregnant breast-feeding How should I use this medication? This drug is usually given as an infusion into a vein.  It is administered in [facility] or clinic by a specially trained health care professional. Talk to your pediatrician regarding the use of this medicine in children.Special care may be needed. Overdosage: If you think you have taken too much of this medicine contact apoison control center or emergency room at once. NOTE: This medicine is only for you. Do not share this medicine with others. What if I miss a dose? It is important not to miss a dose. Call your doctor or health careprofessional if you are unable to keep an appointment. What may interact with this medication? medicines for seizures medicines to increase blood counts like filgrastim, pegfilgrastim, sargramostim some antibiotics like amikacin, gentamicin, neomycin, streptomycin, tobramycin vaccines Talk to your doctor or health care professional before taking any of thesemedicines: acetaminophen aspirin ibuprofen ketoprofen naproxen This list may not describe all possible interactions. Give your health care provider a list of all the medicines, herbs, non-prescription drugs, or dietary supplements you use. Also tell them if you smoke, drink alcohol, or use illegaldrugs. Some items may interact with your medicine. What should I watch for while using this medication? Your condition will be monitored carefully while you are receiving this medicine. You will need important blood work done while you are taking thismedicine. This drug may make you feel generally unwell. This is not uncommon, as chemotherapy can affect healthy cells as well as cancer cells. Report any side effects. Continue your course of treatment even though you feel ill unless yourdoctor tells you to stop. In some cases, you may be given additional medicines to help with side effects.Follow all directions for their use. Call your doctor or health care professional for advice if you get a fever, chills or sore throat, or other symptoms of a cold or flu. Do not treat  yourself. This drug decreases your body's ability to fight infections. Try toavoid being around people who are sick. This medicine may increase your risk to bruise or bleed. Call your doctor orhealth care professional if you notice any unusual bleeding. Be careful brushing and flossing your teeth or using a toothpick because you may get an infection or bleed more easily. If you have any dental work done,tell your dentist you are receiving this medicine. Avoid taking products that contain aspirin, acetaminophen, ibuprofen, naproxen, or ketoprofen unless instructed by your doctor. These medicines may hide afever. Do not become pregnant while taking this medicine. Women should inform their doctor if they wish to become pregnant or think they might be pregnant. There is a potential for serious side effects to an unborn child. Talk to your health care professional or pharmacist for more information. Do not breast-feed aninfant while taking this medicine. What side effects may I notice from receiving this medication? Side effects that you should report to your doctor or health care professionalas soon as possible: allergic reactions like skin rash, itching or hives, swelling of the face, lips, or tongue signs of infection - fever or chills, cough, sore throat, pain or  difficulty passing urine signs of decreased platelets or bleeding - bruising, pinpoint red spots on the skin, black, tarry stools, nosebleeds signs of decreased red blood cells - unusually weak or tired, fainting spells, lightheadedness breathing problems changes in hearing changes in vision chest pain high blood pressure low blood counts - This drug may decrease the number of white blood cells, red blood cells and platelets. You may be at increased risk for infections and bleeding. nausea and vomiting pain, swelling, redness or irritation at the injection site pain, tingling, numbness in the hands or feet problems with balance, talking,  walking trouble passing urine or change in the amount of urine Side effects that usually do not require medical attention (report to yourdoctor or health care professional if they continue or are bothersome): hair loss loss of appetite metallic taste in the mouth or changes in taste This list may not describe all possible side effects. Call your doctor for medical advice about side effects. You may report side effects to FDA at1-800-FDA-1088. Where should I keep my medication? This drug is given in a hospital or clinic and will not be stored at home. NOTE: This sheet is a summary. It may not cover all possible information. If you have questions about this medicine, talk to your doctor, pharmacist, orhealth care provider.  2022 Elsevier/Gold Standard (2008-03-11 14:38:05)

## 2021-08-12 ENCOUNTER — Inpatient Hospital Stay

## 2021-08-12 DIAGNOSIS — C50412 Malignant neoplasm of upper-outer quadrant of left female breast: Secondary | ICD-10-CM

## 2021-08-12 DIAGNOSIS — Z5112 Encounter for antineoplastic immunotherapy: Secondary | ICD-10-CM | POA: Diagnosis not present

## 2021-08-12 LAB — T4: T4, Total: 8.2 ug/dL (ref 4.5–12.0)

## 2021-08-12 MED ORDER — FILGRASTIM-SNDZ 480 MCG/0.8ML IJ SOSY
480.0000 ug | PREFILLED_SYRINGE | Freq: Once | INTRAMUSCULAR | Status: AC
  Administered 2021-08-12: 480 ug via SUBCUTANEOUS
  Filled 2021-08-12: qty 0.8

## 2021-08-13 ENCOUNTER — Inpatient Hospital Stay

## 2021-08-13 DIAGNOSIS — C50412 Malignant neoplasm of upper-outer quadrant of left female breast: Secondary | ICD-10-CM

## 2021-08-13 DIAGNOSIS — Z5112 Encounter for antineoplastic immunotherapy: Secondary | ICD-10-CM | POA: Diagnosis not present

## 2021-08-13 MED ORDER — FILGRASTIM-SNDZ 480 MCG/0.8ML IJ SOSY
480.0000 ug | PREFILLED_SYRINGE | Freq: Once | INTRAMUSCULAR | Status: AC
  Administered 2021-08-13: 480 ug via SUBCUTANEOUS
  Filled 2021-08-13: qty 0.8

## 2021-08-18 ENCOUNTER — Inpatient Hospital Stay (HOSPITAL_BASED_OUTPATIENT_CLINIC_OR_DEPARTMENT_OTHER): Admitting: Oncology

## 2021-08-18 ENCOUNTER — Inpatient Hospital Stay

## 2021-08-18 ENCOUNTER — Encounter: Payer: Self-pay | Admitting: Oncology

## 2021-08-18 VITALS — BP 120/63 | HR 83 | Temp 96.9°F | Resp 18 | Wt 234.3 lb

## 2021-08-18 DIAGNOSIS — Z171 Estrogen receptor negative status [ER-]: Secondary | ICD-10-CM

## 2021-08-18 DIAGNOSIS — Z5112 Encounter for antineoplastic immunotherapy: Secondary | ICD-10-CM | POA: Diagnosis not present

## 2021-08-18 DIAGNOSIS — C50412 Malignant neoplasm of upper-outer quadrant of left female breast: Secondary | ICD-10-CM

## 2021-08-18 DIAGNOSIS — D701 Agranulocytosis secondary to cancer chemotherapy: Secondary | ICD-10-CM

## 2021-08-18 DIAGNOSIS — Z95828 Presence of other vascular implants and grafts: Secondary | ICD-10-CM

## 2021-08-18 DIAGNOSIS — T451X5D Adverse effect of antineoplastic and immunosuppressive drugs, subsequent encounter: Secondary | ICD-10-CM

## 2021-08-18 DIAGNOSIS — G620 Drug-induced polyneuropathy: Secondary | ICD-10-CM | POA: Diagnosis not present

## 2021-08-18 DIAGNOSIS — Z5111 Encounter for antineoplastic chemotherapy: Secondary | ICD-10-CM

## 2021-08-18 LAB — COMPREHENSIVE METABOLIC PANEL
ALT: 30 U/L (ref 0–44)
AST: 23 U/L (ref 15–41)
Albumin: 4.3 g/dL (ref 3.5–5.0)
Alkaline Phosphatase: 89 U/L (ref 38–126)
Anion gap: 6 (ref 5–15)
BUN: 16 mg/dL (ref 6–20)
CO2: 25 mmol/L (ref 22–32)
Calcium: 9.2 mg/dL (ref 8.9–10.3)
Chloride: 105 mmol/L (ref 98–111)
Creatinine, Ser: 0.69 mg/dL (ref 0.44–1.00)
GFR, Estimated: >60 mL/min (ref >60)
Glucose, Bld: 107 mg/dL — ABNORMAL HIGH (ref 70–99)
Potassium: 4.4 mmol/L (ref 3.5–5.1)
Sodium: 136 mmol/L (ref 135–145)
Total Bilirubin: 0.2 mg/dL — ABNORMAL LOW (ref 0.3–1.2)
Total Protein: 7.7 g/dL (ref 6.5–8.1)

## 2021-08-18 LAB — CBC WITH DIFFERENTIAL/PLATELET
Abs Immature Granulocytes: 0.12 10*3/uL — ABNORMAL HIGH (ref 0.00–0.07)
Basophils Absolute: 0 10*3/uL (ref 0.0–0.1)
Basophils Relative: 1 %
Eosinophils Absolute: 0 10*3/uL (ref 0.0–0.5)
Eosinophils Relative: 0 %
HCT: 36.2 % (ref 36.0–46.0)
Hemoglobin: 11.5 g/dL — ABNORMAL LOW (ref 12.0–15.0)
Immature Granulocytes: 3 %
Lymphocytes Relative: 39 %
Lymphs Abs: 1.6 10*3/uL (ref 0.7–4.0)
MCH: 28 pg (ref 26.0–34.0)
MCHC: 31.8 g/dL (ref 30.0–36.0)
MCV: 88.3 fL (ref 80.0–100.0)
Monocytes Absolute: 0.5 10*3/uL (ref 0.1–1.0)
Monocytes Relative: 11 %
Neutro Abs: 1.9 10*3/uL (ref 1.7–7.7)
Neutrophils Relative %: 46 %
Platelets: 378 10*3/uL (ref 150–400)
RBC: 4.1 MIL/uL (ref 3.87–5.11)
RDW: 15.8 % — ABNORMAL HIGH (ref 11.5–15.5)
Smear Review: NORMAL
WBC: 4.1 10*3/uL (ref 4.0–10.5)
nRBC: 0 % (ref 0.0–0.2)

## 2021-08-18 MED ORDER — SODIUM CHLORIDE 0.9% FLUSH
10.0000 mL | Freq: Once | INTRAVENOUS | Status: AC
  Administered 2021-08-18: 10 mL via INTRAVENOUS
  Filled 2021-08-18: qty 10

## 2021-08-18 MED ORDER — FAMOTIDINE 20 MG IN NS 100 ML IVPB
20.0000 mg | Freq: Once | INTRAVENOUS | Status: AC
  Administered 2021-08-18: 20 mg via INTRAVENOUS
  Filled 2021-08-18: qty 20

## 2021-08-18 MED ORDER — SODIUM CHLORIDE 0.9 % IV SOLN
10.0000 mg | Freq: Once | INTRAVENOUS | Status: AC
  Administered 2021-08-18: 10 mg via INTRAVENOUS
  Filled 2021-08-18: qty 10

## 2021-08-18 MED ORDER — SODIUM CHLORIDE 0.9 % IV SOLN
Freq: Once | INTRAVENOUS | Status: AC
  Filled 2021-08-18: qty 250

## 2021-08-18 MED ORDER — HEPARIN SOD (PORK) LOCK FLUSH 100 UNIT/ML IV SOLN
INTRAVENOUS | Status: AC
  Administered 2021-08-18: 500 [IU]
  Filled 2021-08-18: qty 5

## 2021-08-18 MED ORDER — SODIUM CHLORIDE 0.9 % IV SOLN
300.0000 mg | Freq: Once | INTRAVENOUS | Status: AC
  Administered 2021-08-18: 300 mg via INTRAVENOUS
  Filled 2021-08-18: qty 30

## 2021-08-18 MED ORDER — SODIUM CHLORIDE 0.9 % IV SOLN
70.0000 mg/m2 | Freq: Once | INTRAVENOUS | Status: AC
  Administered 2021-08-18: 150 mg via INTRAVENOUS
  Filled 2021-08-18: qty 25

## 2021-08-18 MED ORDER — HEPARIN SOD (PORK) LOCK FLUSH 100 UNIT/ML IV SOLN
500.0000 [IU] | Freq: Once | INTRAVENOUS | Status: DC
  Filled 2021-08-18: qty 5

## 2021-08-18 MED ORDER — DIPHENHYDRAMINE HCL 50 MG/ML IJ SOLN
50.0000 mg | Freq: Once | INTRAMUSCULAR | Status: AC
  Administered 2021-08-18: 50 mg via INTRAVENOUS
  Filled 2021-08-18: qty 1

## 2021-08-18 NOTE — Progress Notes (Signed)
Pt here for follow up. No new concerns voiced.   

## 2021-08-18 NOTE — Progress Notes (Signed)
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  female with PMH listed below was seen in consultation at the request of  Renee Rival, NP  for evaluation of triple negative breast cancer.  03/30/2021, screening mammogram with 3D 2 nodular areas of asymmetric density demonstrated within the superior lateral aspect of the left breast middle third depth.  Finding was best appreciated on 3D tomosynthesis imaging. Targeted ultrasound showed left upper outer breast 1:30 position 1.1 cm round hypoechoic mass with angular margins. 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 Bary Castilla yesterday.  She presents to establish care with me today Patient did not notice this left mass prior to the mammogram.   Case was discussed on Tumor board.  Pathology reports and mammogram/ultrasound were done at outside facility and results were reviewed and discussed with patient and her husband.  LN status was not mentioned on her [place]. Recommend additional images with mammogram and MRI, neoadjuvant chemotherapy  Family history of breast cancer: Breast cancer in her sister, her late 29s, early 23s; and being paternal grandmother Menarche: 32 Age at first live childbirth: 34  patient has 2 sons and 1 daughter.  Daughter has passed away Used OCP:  Used estrogen and progesterone therapy: Denies History of Radiation to the chest: Denies Previous of breast biopsy: Denies  /25/2022 diagnostic mammogram of left breast showed 2.1 cm biopsy-proven  malignancy in the lateral left breast at 2:00, 4 cm from nipple.  Directly adjacent cystic mass, 1.5 cm in size, consistent with biopsy hematoma.  1 abnormal and 1 borderline abnormal left axillary lymph node  05/18/2021 bilateral breast MRI with and without contrast showed 2.5 x 3 x 2.5 cm hematoma containing biopsy clip artifact within the upper outer left breast.  1.6 cm nodular non-mass-like enhancement 2.5 cm posterior/superior to the biopsy clip may represent biopsy-proven malignancy or additional areas of malignancy.  Single abnormal appearing left axillary lymph node with eccentric cortical thickening.  No MRI evidence of the right breast malignancy.  #05/07/2021, Mediport was placed by Dr. Bary Castilla #05/14/2021 echocardiogram showed LVEF 60-55%  05/20/2021 Slide consultation of her left breast biopsy from outside institution - invasive mammary carcinoma, no special type, grade 3, DCIS and LVI not identified. ER negative, PR negative, HER2 IHC negative. Ki 67 70-80%    05/20/2021 ultrasound-guided left axillary lymph node was negative for malignancy.  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  D15- not done due to cytopenia  INTERVAL HISTORY Peggy Bates is a 55 y.o. female who has above history reviewed by me today presents for follow up visit for management of left triple negative breast cancer Problems and complaints are listed below: Patient is on neoadjuvant chemotherapy  Patient reports doing well. Denies any new complaints  Left foot and fingertips neuropathy.  Patient has been on gabapentin 600 mg daily for her mood disorder.  No nausea vomiting diarrhea.  No fever  or chills . Marland Kitchen Review of Systems  Constitutional:  Negative for appetite change, chills, fatigue and fever.  HENT:   Negative for  hearing loss and voice change.   Eyes:  Negative for eye problems.  Respiratory:  Negative for chest tightness and cough.   Cardiovascular:  Negative for chest pain.  Gastrointestinal:  Negative for abdominal distention, abdominal pain and blood in stool.  Endocrine: Negative for hot flashes.  Genitourinary:  Negative for difficulty urinating and frequency.   Musculoskeletal:  Negative for arthralgias.  Skin:  Negative for itching and rash.  Neurological:  Positive for numbness. Negative for extremity weakness.  Hematological:  Negative for adenopathy.  Psychiatric/Behavioral:  Negative for confusion.    MEDICAL HISTORY:  Past Medical History:  Diagnosis Date   Arthritis    knees   COVID-19 12/2020   Depression    Family history of breast cancer    Goiter    Malignant neoplasm of left female breast ([place]) 04/07/2021    SURGICAL HISTORY: Past Surgical History:  Procedure Laterality Date   BREAST BIOPSY Left 04/07/2021   [facility] in [place]:u/s bx triple neg   BREAST BIOPSY Left 05/20/2021   [place] Axilla Bx, hydro 3 marker, path pending    KNEE ARTHROSCOPY     PORTACATH PLACEMENT Right 05/07/2021   Procedure: INSERTION PORT-A-CATH;  Surgeon: Robert Bellow, MD;  Location: [facility] ORS;  Service: General;  Laterality: Right;   TOOTH EXTRACTION     TRANSCERVICAL UTERINE FIBROID(S) ABLATION  2010    SOCIAL HISTORY: Social History   Socioeconomic History   Marital status: Married    Spouse name: Not on file   Number of children: Not on file   Years of education: Not on file   Highest education level: Not on file  Occupational History   Not on file  Tobacco Use   Smoking status: Never   Smokeless tobacco: Never  Vaping Use   Vaping Use: Never used  Substance and Sexual Activity   Alcohol use: Never   Drug use: Never   Sexual activity: Yes  Other Topics Concern   Not on file  Social History Narrative   ** Merged History Encounter **       Social  Determinants of Health   Financial Resource Strain: Not on file  Food Insecurity: Not on file  Transportation Needs: Not on file  Physical Activity: Not on file  Stress: Not on file  Social Connections: Not on file  Intimate Partner Violence: Not on file    FAMILY HISTORY: Family History  Problem Relation Age of Onset   Breast cancer Paternal Grandmother    Breast cancer Sister        dx 38s, recurrence x3   Diabetes Sister    Diabetes Father    Throat cancer Maternal Grandmother     ALLERGIES:  has No Known Allergies.  MEDICATIONS:  Current Outpatient Medications  Medication Sig Dispense Refill   chlorhexidine (PERIDEX) 0.12 % solution Use as directed 10 mLs in the mouth or throat 2 (two) times daily. 473 mL 2   cholecalciferol (VITAMIN D) 25 MCG (1000 UNIT) tablet Take 1,000 Units by mouth daily.     dexamethasone (DECADRON) 4 MG tablet Take 2 tablets once a day for 3 days after carboplatin and AC chemotherapy. Take with food. 30 tablet 1   diclofenac Sodium (VOLTAREN) 1 % GEL Apply 1 application topically daily.     gabapentin (NEURONTIN) 600 MG tablet Take 600  mg by mouth every morning.     Glucosamine-Chondroitin (MOVE FREE PO) Take 1 tablet by mouth daily.     levonorgestrel (MIRENA) 20 MCG/DAY IUD 1 each by Intrauterine route once.     lidocaine-prilocaine (EMLA) cream Apply to affected area once 30 g 3   meloxicam (MOBIC) 15 MG tablet Take 15 mg by mouth daily.     ondansetron (ZOFRAN) 8 MG tablet Take 1 tablet (8 mg total) by mouth 2 (two) times daily as needed. Start on the third day after carboplatin and AC chemotherapy. (Patient not taking: No sig reported) 30 tablet 1   phentermine (ADIPEX-P) 37.5 MG tablet 1 tablet (Patient not taking: No sig reported)     prochlorperazine (COMPAZINE) 10 MG tablet Take 1 tablet (10 mg total) by mouth every 6 (six) hours as needed (Nausea or vomiting). (Patient not taking: No sig reported) 30 tablet 1   No current  facility-administered medications for this visit.   Facility-Administered Medications Ordered in Other Visits  Medication Dose Route Frequency Provider Last Rate Last Admin   heparin lock flush 100 unit/mL  500 Units Intravenous Once Earlie Server, MD         PHYSICAL EXAMINATION: ECOG PERFORMANCE STATUS: 0 - Asymptomatic Vitals:   08/18/21 0848  BP: 120/63  Pulse: 83  Resp: 18  Temp: (!) 96.9 F (36.1 C)   Filed Weights   08/18/21 0848  Weight: 234 lb 4.8 oz (106.3 kg)    Physical Exam Constitutional:      General: She is not in acute distress. HENT:     Head: Normocephalic and atraumatic.  Eyes:     General: No scleral icterus. Cardiovascular:     Rate and Rhythm: Normal rate and regular rhythm.     Heart sounds: Normal heart sounds.  Pulmonary:     Effort: Pulmonary effort is normal. No respiratory distress.     Breath sounds: No wheezing.  Abdominal:     General: Bowel sounds are normal. There is no distension.     Palpations: Abdomen is soft.  Musculoskeletal:        General: No deformity. Normal range of motion.     Cervical back: Normal range of motion and neck supple.  Skin:    General: Skin is warm and dry.     Findings: No erythema or rash.  Neurological:     Mental Status: She is alert and oriented to person, place, and time. Mental status is at baseline.     Cranial Nerves: No cranial nerve deficit.     Coordination: Coordination normal.  Psychiatric:        Mood and Affect: Mood normal.    LABORATORY DATA:  I have reviewed the data as listed Lab Results  Component Value Date   WBC 2.9 (L) 08/11/2021   HGB 11.8 (L) 08/11/2021   HCT 36.4 08/11/2021   MCV 86.9 08/11/2021   PLT 326 08/11/2021   Recent Labs    07/28/21 0939 08/04/21 1039 08/11/21 0925  NA 134* 136 135  K 4.3 4.5 4.3  CL 102 103 104  CO2 '27 27 24  ' GLUCOSE 110* 104* 100*  BUN 21* 18 22*  CREATININE 0.67 0.76 0.80  CALCIUM 9.1 9.2 9.4  GFRNONAA >60 >60 >60  PROT 7.7 7.3 7.8   ALBUMIN 4.3 4.2 4.4  AST 24 27 33  ALT 28 36 51*  ALKPHOS 85 92 86  BILITOT 0.5 0.8 0.3    Iron/TIBC/Ferritin/ %Sat No results found  for: IRON, TIBC, FERRITIN, IRONPCTSAT    RADIOGRAPHIC STUDIES: I have personally reviewed the radiological images as listed and agreed with the findings in the report. No results found.    ASSESSMENT & PLAN:  1. Carcinoma of upper-outer quadrant of left breast in female, estrogen receptor negative ([place])   2. Encounter for antineoplastic chemotherapy   3. Chemotherapy-induced neutropenia ([facility])   4. Chemotherapy-induced neuropathy ([place])   .Cancer Staging Carcinoma of upper-outer quadrant of left breast in female, estrogen receptor negative ([place]) Staging form: Breast, AJCC 8th Edition - Clinical stage from 04/29/2021: Stage IIB (cT2, cN0, cM0, G3, ER-, PR-, HER2-) - Signed by Earlie Server, MD on 06/04/2021   #Left breast invasive mammary carcinoma, triple negative, + high-grade DCIS ER positive cT2 N0, stage IIB Labs are reviewed and discussed with patient Proceed with cycle 4, day 8  carboplatin, Taxol. Patient gets G-CSF support on day 9 and day 10. Claritin for 4 days Obtain MRI breast around 08/30/2021  #Back pain, patient prefers to defer imaging studies.  Symptoms are stable. #Therapy induced neutropenia, stable continue close monitor.  She receives short acting G-CSF support.Peridex solution swish and spit BID for prophylaxis.  #Transaminitis, resolved. #Chemotherapy-induced neuropathy, patient is currently on gabapentin 600 mg daily for mood disorder.  She prefers not to further increase gabapentin dosage or add additional agents for neuropathy.  Monitor closely.  Taxol is reduced to 70 mg/metered square.  Supportive care measures are necessary for patient well-being and will be provided as necessary. We spent sufficient time to discuss many aspect of care, questions were answered to patient's satisfaction.  All questions were answered. The  patient knows to call the clinic with any problems questions or concerns.  cc Renee Rival, NP    Return of visit: 1 week, lab MD Doristine Church AUC 2 with Taxol    Earlie Server, MD, [name] Hematology Oncology [facility] [place] Pager- [phone] 08/18/2021

## 2021-08-18 NOTE — Patient Instructions (Signed)
[facility]  Discharge Instructions: Thank you for choosing [facility] to provide your oncology and hematology care.  If you have a lab appointment with the Cancer Center, please go directly to the Cancer Center and check in at the registration area.  Wear comfortable clothing and clothing appropriate for easy access to any Portacath or PICC line.   We strive to give you quality time with your provider. You may need to reschedule your appointment if you arrive late (15 or more minutes).  Arriving late affects you and other patients whose appointments are after yours.  Also, if you miss three or more appointments without notifying the office, you may be dismissed from the clinic at the provider's discretion.      For prescription refill requests, have your pharmacy contact our office and allow 72 hours for refills to be completed.    Today you received the following chemotherapy and/or immunotherapy agents CARBOPLATIN and TAXOL      To help prevent nausea and vomiting after your treatment, we encourage you to take your nausea medication as directed.  BELOW ARE SYMPTOMS THAT SHOULD BE REPORTED IMMEDIATELY: *FEVER GREATER THAN 100.4 F (38 C) OR HIGHER *CHILLS OR SWEATING *NAUSEA AND VOMITING THAT IS NOT CONTROLLED WITH YOUR NAUSEA MEDICATION *UNUSUAL SHORTNESS OF BREATH *UNUSUAL BRUISING OR BLEEDING *URINARY PROBLEMS (pain or burning when urinating, or frequent urination) *BOWEL PROBLEMS (unusual diarrhea, constipation, pain near the anus) TENDERNESS IN MOUTH AND THROAT WITH OR WITHOUT PRESENCE OF ULCERS (sore throat, sores in mouth, or a toothache) UNUSUAL RASH, SWELLING OR PAIN  UNUSUAL VAGINAL DISCHARGE OR ITCHING   Items with * indicate a potential emergency and should be followed up as soon as possible or go to the Emergency Department if any problems should occur.  Please show the CHEMOTHERAPY ALERT CARD or IMMUNOTHERAPY ALERT CARD  at check-in to the Emergency Department and triage nurse.  Should you have questions after your visit or need to cancel or reschedule your appointment, please contact [facility]  [phone] and follow the prompts.  Office hours are 8:00 a.m. to 4:30 p.m. Monday - Friday. Please note that voicemails left after 4:00 p.m. may not be returned until the following business day.  We are closed weekends and major holidays. You have access to a nurse at all times for urgent questions. Please call the main number to the clinic [phone] and follow the prompts.  For any non-urgent questions, you may also contact your provider using MyChart. We now offer e-Visits for anyone 18 and older to request care online for non-urgent symptoms. For details visit [email or web address].   Also download the MyChart app! Go to the app store, search "MyChart", open the app, select [facility], and log in with your MyChart username and password.  Due to Covid, a mask is required upon entering the hospital/clinic. If you do not have a mask, one will be given to you upon arrival. For doctor visits, patients may have 1 support person aged 18 or older with them. For treatment visits, patients cannot have anyone with them due to current Covid guidelines and our immunocompromised population.   Carboplatin injection What is this medication? CARBOPLATIN (KAR boe pla tin) is a chemotherapy drug. It targets fast dividing cells, like cancer cells, and causes these cells to die. This medicine is used to treat ovarian cancer and many other cancers. This medicine may be used for other purposes; ask your health care   provider or pharmacist if you have questions. COMMON BRAND NAME(S): Paraplatin What should I tell my care team before I take this medication? They need to know if you have any of these conditions: blood disorders hearing problems kidney disease recent or ongoing radiation therapy an  unusual or allergic reaction to carboplatin, cisplatin, other chemotherapy, other medicines, foods, dyes, or preservatives pregnant or trying to get pregnant breast-feeding How should I use this medication? This drug is usually given as an infusion into a vein. It is administered in a hospital or clinic by a specially trained health care professional. Talk to your pediatrician regarding the use of this medicine in children. Special care may be needed. Overdosage: If you think you have taken too much of this medicine contact a poison control center or emergency room at once. NOTE: This medicine is only for you. Do not share this medicine with others. What if I miss a dose? It is important not to miss a dose. Call your doctor or health care professional if you are unable to keep an appointment. What may interact with this medication? medicines for seizures medicines to increase blood counts like filgrastim, pegfilgrastim, sargramostim some antibiotics like amikacin, gentamicin, neomycin, streptomycin, tobramycin vaccines Talk to your doctor or health care professional before taking any of these medicines: acetaminophen aspirin ibuprofen ketoprofen naproxen This list may not describe all possible interactions. Give your health care provider a list of all the medicines, herbs, non-prescription drugs, or dietary supplements you use. Also tell them if you smoke, drink alcohol, or use illegal drugs. Some items may interact with your medicine. What should I watch for while using this medication? Your condition will be monitored carefully while you are receiving this medicine. You will need important blood work done while you are taking this medicine. This drug may make you feel generally unwell. This is not uncommon, as chemotherapy can affect healthy cells as well as cancer cells. Report any side effects. Continue your course of treatment even though you feel ill unless your doctor tells you to  stop. In some cases, you may be given additional medicines to help with side effects. Follow all directions for their use. Call your doctor or health care professional for advice if you get a fever, chills or sore throat, or other symptoms of a cold or flu. Do not treat yourself. This drug decreases your body's ability to fight infections. Try to avoid being around people who are sick. This medicine may increase your risk to bruise or bleed. Call your doctor or health care professional if you notice any unusual bleeding. Be careful brushing and flossing your teeth or using a toothpick because you may get an infection or bleed more easily. If you have any dental work done, tell your dentist you are receiving this medicine. Avoid taking products that contain aspirin, acetaminophen, ibuprofen, naproxen, or ketoprofen unless instructed by your doctor. These medicines may hide a fever. Do not become pregnant while taking this medicine. Women should inform their doctor if they wish to become pregnant or think they might be pregnant. There is a potential for serious side effects to an unborn child. Talk to your health care professional or pharmacist for more information. Do not breast-feed an infant while taking this medicine. What side effects may I notice from receiving this medication? Side effects that you should report to your doctor or health care professional as soon as possible: allergic reactions like skin rash, itching or hives, swelling of the face, lips,   or tongue signs of infection - fever or chills, cough, sore throat, pain or difficulty passing urine signs of decreased platelets or bleeding - bruising, pinpoint red spots on the skin, black, tarry stools, nosebleeds signs of decreased red blood cells - unusually weak or tired, fainting spells, lightheadedness breathing problems changes in hearing changes in vision chest pain high blood pressure low blood counts - This drug may decrease the  number of white blood cells, red blood cells and platelets. You may be at increased risk for infections and bleeding. nausea and vomiting pain, swelling, redness or irritation at the injection site pain, tingling, numbness in the hands or feet problems with balance, talking, walking trouble passing urine or change in the amount of urine Side effects that usually do not require medical attention (report to your doctor or health care professional if they continue or are bothersome): hair loss loss of appetite metallic taste in the mouth or changes in taste This list may not describe all possible side effects. Call your doctor for medical advice about side effects. You may report side effects to FDA at 1-800-FDA-1088. Where should I keep my medication? This drug is given in a hospital or clinic and will not be stored at home. NOTE: This sheet is a summary. It may not cover all possible information. If you have questions about this medicine, talk to your doctor, pharmacist, or health care provider.  2022 Elsevier/Gold Standard (2008-03-11 14:38:05)  Paclitaxel injection What is this medication? PACLITAXEL (PAK li TAX el) is a chemotherapy drug. It targets fast dividing cells, like cancer cells, and causes these cells to die. This medicine is used to treat ovarian cancer, breast cancer, lung cancer, Kaposi's sarcoma, and other cancers. This medicine may be used for other purposes; ask your health care provider or pharmacist if you have questions. COMMON BRAND NAME(S): Onxol, Taxol What should I tell my care team before I take this medication? They need to know if you have any of these conditions: history of irregular heartbeat liver disease low blood counts, like low white cell, platelet, or red cell counts lung or breathing disease, like asthma tingling of the fingers or toes, or other nerve disorder an unusual or allergic reaction to paclitaxel, alcohol, polyoxyethylated castor oil, other  chemotherapy, other medicines, foods, dyes, or preservatives pregnant or trying to get pregnant breast-feeding How should I use this medication? This drug is given as an infusion into a vein. It is administered in a hospital or clinic by a specially trained health care professional. Talk to your pediatrician regarding the use of this medicine in children. Special care may be needed. Overdosage: If you think you have taken too much of this medicine contact a poison control center or emergency room at once. NOTE: This medicine is only for you. Do not share this medicine with others. What if I miss a dose? It is important not to miss your dose. Call your doctor or health care professional if you are unable to keep an appointment. What may interact with this medication? Do not take this medicine with any of the following medications: live virus vaccines This medicine may also interact with the following medications: antiviral medicines for hepatitis, HIV or AIDS certain antibiotics like erythromycin and clarithromycin certain medicines for fungal infections like ketoconazole and itraconazole certain medicines for seizures like carbamazepine, phenobarbital, phenytoin gemfibrozil nefazodone rifampin St. John's wort This list may not describe all possible interactions. Give your health care provider a list of all the medicines,   herbs, non-prescription drugs, or dietary supplements you use. Also tell them if you smoke, drink alcohol, or use illegal drugs. Some items may interact with your medicine. What should I watch for while using this medication? Your condition will be monitored carefully while you are receiving this medicine. You will need important blood work done while you are taking this medicine. This medicine can cause serious allergic reactions. To reduce your risk you will need to take other medicine(s) before treatment with this medicine. If you experience allergic reactions like skin  rash, itching or hives, swelling of the face, lips, or tongue, tell your doctor or health care professional right away. In some cases, you may be given additional medicines to help with side effects. Follow all directions for their use. This drug may make you feel generally unwell. This is not uncommon, as chemotherapy can affect healthy cells as well as cancer cells. Report any side effects. Continue your course of treatment even though you feel ill unless your doctor tells you to stop. Call your doctor or health care professional for advice if you get a fever, chills or sore throat, or other symptoms of a cold or flu. Do not treat yourself. This drug decreases your body's ability to fight infections. Try to avoid being around people who are sick. This medicine may increase your risk to bruise or bleed. Call your doctor or health care professional if you notice any unusual bleeding. Be careful brushing and flossing your teeth or using a toothpick because you may get an infection or bleed more easily. If you have any dental work done, tell your dentist you are receiving this medicine. Avoid taking products that contain aspirin, acetaminophen, ibuprofen, naproxen, or ketoprofen unless instructed by your doctor. These medicines may hide a fever. Do not become pregnant while taking this medicine. Women should inform their doctor if they wish to become pregnant or think they might be pregnant. There is a potential for serious side effects to an unborn child. Talk to your health care professional or pharmacist for more information. Do not breast-feed an infant while taking this medicine. Men are advised not to father a child while receiving this medicine. This product may contain alcohol. Ask your pharmacist or healthcare provider if this medicine contains alcohol. Be sure to tell all healthcare providers you are taking this medicine. Certain medicines, like metronidazole and disulfiram, can cause an unpleasant  reaction when taken with alcohol. The reaction includes flushing, headache, nausea, vomiting, sweating, and increased thirst. The reaction can last from 30 minutes to several hours. What side effects may I notice from receiving this medication? Side effects that you should report to your doctor or health care professional as soon as possible: allergic reactions like skin rash, itching or hives, swelling of the face, lips, or tongue breathing problems changes in vision fast, irregular heartbeat high or low blood pressure mouth sores pain, tingling, numbness in the hands or feet signs of decreased platelets or bleeding - bruising, pinpoint red spots on the skin, black, tarry stools, blood in the urine signs of decreased red blood cells - unusually weak or tired, feeling faint or lightheaded, falls signs of infection - fever or chills, cough, sore throat, pain or difficulty passing urine signs and symptoms of liver injury like dark yellow or brown urine; general ill feeling or flu-like symptoms; light-colored stools; loss of appetite; nausea; right upper belly pain; unusually weak or tired; yellowing of the eyes or skin swelling of the ankles, feet, hands   unusually slow heartbeat Side effects that usually do not require medical attention (report to your doctor or health care professional if they continue or are bothersome): diarrhea hair loss loss of appetite muscle or joint pain nausea, vomiting pain, redness, or irritation at site where injected tiredness This list may not describe all possible side effects. Call your doctor for medical advice about side effects. You may report side effects to FDA at 1-800-FDA-1088. Where should I keep my medication? This drug is given in a hospital or clinic and will not be stored at home. NOTE: This sheet is a summary. It may not cover all possible information. If you have questions about this medicine, talk to your doctor, pharmacist, or health care  provider.  2022 Elsevier/Gold Standard (2019-11-06 13:37:23)  

## 2021-08-19 ENCOUNTER — Inpatient Hospital Stay: Attending: Internal Medicine

## 2021-08-19 DIAGNOSIS — C50412 Malignant neoplasm of upper-outer quadrant of left female breast: Secondary | ICD-10-CM | POA: Insufficient documentation

## 2021-08-19 DIAGNOSIS — Z171 Estrogen receptor negative status [ER-]: Secondary | ICD-10-CM | POA: Diagnosis not present

## 2021-08-19 DIAGNOSIS — D61810 Antineoplastic chemotherapy induced pancytopenia: Secondary | ICD-10-CM | POA: Diagnosis not present

## 2021-08-19 DIAGNOSIS — Z801 Family history of malignant neoplasm of trachea, bronchus and lung: Secondary | ICD-10-CM | POA: Insufficient documentation

## 2021-08-19 DIAGNOSIS — G620 Drug-induced polyneuropathy: Secondary | ICD-10-CM | POA: Insufficient documentation

## 2021-08-19 DIAGNOSIS — Z803 Family history of malignant neoplasm of breast: Secondary | ICD-10-CM | POA: Insufficient documentation

## 2021-08-19 DIAGNOSIS — Z5111 Encounter for antineoplastic chemotherapy: Secondary | ICD-10-CM | POA: Diagnosis not present

## 2021-08-19 DIAGNOSIS — T451X5A Adverse effect of antineoplastic and immunosuppressive drugs, initial encounter: Secondary | ICD-10-CM | POA: Diagnosis not present

## 2021-08-19 DIAGNOSIS — M549 Dorsalgia, unspecified: Secondary | ICD-10-CM | POA: Diagnosis not present

## 2021-08-19 DIAGNOSIS — F39 Unspecified mood [affective] disorder: Secondary | ICD-10-CM | POA: Diagnosis not present

## 2021-08-19 DIAGNOSIS — Z5189 Encounter for other specified aftercare: Secondary | ICD-10-CM | POA: Insufficient documentation

## 2021-08-19 MED ORDER — FILGRASTIM-SNDZ 480 MCG/0.8ML IJ SOSY
480.0000 ug | PREFILLED_SYRINGE | Freq: Once | INTRAMUSCULAR | Status: AC
  Administered 2021-08-19: 480 ug via SUBCUTANEOUS
  Filled 2021-08-19: qty 0.8

## 2021-08-20 ENCOUNTER — Ambulatory Visit

## 2021-08-20 ENCOUNTER — Inpatient Hospital Stay

## 2021-08-20 DIAGNOSIS — Z5111 Encounter for antineoplastic chemotherapy: Secondary | ICD-10-CM | POA: Diagnosis not present

## 2021-08-20 DIAGNOSIS — C50412 Malignant neoplasm of upper-outer quadrant of left female breast: Secondary | ICD-10-CM

## 2021-08-20 DIAGNOSIS — Z171 Estrogen receptor negative status [ER-]: Secondary | ICD-10-CM

## 2021-08-20 MED ORDER — FILGRASTIM-SNDZ 480 MCG/0.8ML IJ SOSY
480.0000 ug | PREFILLED_SYRINGE | Freq: Once | INTRAMUSCULAR | Status: AC
  Administered 2021-08-20: 480 ug via SUBCUTANEOUS
  Filled 2021-08-20: qty 0.8

## 2021-08-25 ENCOUNTER — Encounter: Payer: Self-pay | Admitting: Oncology

## 2021-08-25 ENCOUNTER — Inpatient Hospital Stay

## 2021-08-25 ENCOUNTER — Inpatient Hospital Stay (HOSPITAL_BASED_OUTPATIENT_CLINIC_OR_DEPARTMENT_OTHER): Admitting: Oncology

## 2021-08-25 VITALS — BP 131/88 | HR 81 | Temp 98.2°F | Resp 17 | Wt 240.0 lb

## 2021-08-25 DIAGNOSIS — D701 Agranulocytosis secondary to cancer chemotherapy: Secondary | ICD-10-CM

## 2021-08-25 DIAGNOSIS — Z5111 Encounter for antineoplastic chemotherapy: Secondary | ICD-10-CM | POA: Diagnosis not present

## 2021-08-25 DIAGNOSIS — C50412 Malignant neoplasm of upper-outer quadrant of left female breast: Secondary | ICD-10-CM

## 2021-08-25 DIAGNOSIS — Z171 Estrogen receptor negative status [ER-]: Secondary | ICD-10-CM

## 2021-08-25 DIAGNOSIS — T451X5A Adverse effect of antineoplastic and immunosuppressive drugs, initial encounter: Secondary | ICD-10-CM

## 2021-08-25 DIAGNOSIS — G620 Drug-induced polyneuropathy: Secondary | ICD-10-CM | POA: Diagnosis not present

## 2021-08-25 LAB — CBC WITH DIFFERENTIAL/PLATELET
Abs Immature Granulocytes: 0.41 10*3/uL — ABNORMAL HIGH (ref 0.00–0.07)
Basophils Absolute: 0.1 10*3/uL (ref 0.0–0.1)
Basophils Relative: 1 %
Eosinophils Absolute: 0 10*3/uL (ref 0.0–0.5)
Eosinophils Relative: 0 %
HCT: 33.1 % — ABNORMAL LOW (ref 36.0–46.0)
Hemoglobin: 10.7 g/dL — ABNORMAL LOW (ref 12.0–15.0)
Immature Granulocytes: 8 %
Lymphocytes Relative: 35 %
Lymphs Abs: 1.9 10*3/uL (ref 0.7–4.0)
MCH: 28.5 pg (ref 26.0–34.0)
MCHC: 32.3 g/dL (ref 30.0–36.0)
MCV: 88 fL (ref 80.0–100.0)
Monocytes Absolute: 0.8 10*3/uL (ref 0.1–1.0)
Monocytes Relative: 14 %
Neutro Abs: 2.4 10*3/uL (ref 1.7–7.7)
Neutrophils Relative %: 42 %
Platelets: 328 10*3/uL (ref 150–400)
RBC: 3.76 MIL/uL — ABNORMAL LOW (ref 3.87–5.11)
RDW: 16.8 % — ABNORMAL HIGH (ref 11.5–15.5)
Smear Review: NORMAL
WBC: 5.5 10*3/uL (ref 4.0–10.5)
nRBC: 0 % (ref 0.0–0.2)

## 2021-08-25 LAB — COMPREHENSIVE METABOLIC PANEL
ALT: 34 U/L (ref 0–44)
AST: 26 U/L (ref 15–41)
Albumin: 4.1 g/dL (ref 3.5–5.0)
Alkaline Phosphatase: 91 U/L (ref 38–126)
Anion gap: 5 (ref 5–15)
BUN: 17 mg/dL (ref 6–20)
CO2: 26 mmol/L (ref 22–32)
Calcium: 9.4 mg/dL (ref 8.9–10.3)
Chloride: 106 mmol/L (ref 98–111)
Creatinine, Ser: 0.58 mg/dL (ref 0.44–1.00)
GFR, Estimated: >60 mL/min (ref >60)
Glucose, Bld: 93 mg/dL (ref 70–99)
Potassium: 4.2 mmol/L (ref 3.5–5.1)
Sodium: 137 mmol/L (ref 135–145)
Total Bilirubin: 0.4 mg/dL (ref 0.3–1.2)
Total Protein: 7.4 g/dL (ref 6.5–8.1)

## 2021-08-25 MED ORDER — FAMOTIDINE 20 MG IN NS 100 ML IVPB
20.0000 mg | Freq: Once | INTRAVENOUS | Status: AC
  Administered 2021-08-25: 20 mg via INTRAVENOUS
  Filled 2021-08-25: qty 20

## 2021-08-25 MED ORDER — SODIUM CHLORIDE 0.9 % IV SOLN
10.0000 mg | Freq: Once | INTRAVENOUS | Status: AC
  Administered 2021-08-25: 10 mg via INTRAVENOUS
  Filled 2021-08-25: qty 10

## 2021-08-25 MED ORDER — SODIUM CHLORIDE 0.9% FLUSH
10.0000 mL | INTRAVENOUS | Status: DC | PRN
  Administered 2021-08-25: 10 mL via INTRAVENOUS
  Filled 2021-08-25: qty 10

## 2021-08-25 MED ORDER — DIPHENHYDRAMINE HCL 50 MG/ML IJ SOLN
50.0000 mg | Freq: Once | INTRAMUSCULAR | Status: AC
  Administered 2021-08-25: 50 mg via INTRAVENOUS
  Filled 2021-08-25: qty 1

## 2021-08-25 MED ORDER — SODIUM CHLORIDE 0.9 % IV SOLN
300.0000 mg | Freq: Once | INTRAVENOUS | Status: AC
  Administered 2021-08-25: 300 mg via INTRAVENOUS
  Filled 2021-08-25: qty 30

## 2021-08-25 MED ORDER — SODIUM CHLORIDE 0.9 % IV SOLN
70.0000 mg/m2 | Freq: Once | INTRAVENOUS | Status: AC
  Administered 2021-08-25: 150 mg via INTRAVENOUS
  Filled 2021-08-25: qty 25

## 2021-08-25 MED ORDER — HEPARIN SOD (PORK) LOCK FLUSH 100 UNIT/ML IV SOLN
500.0000 [IU] | Freq: Once | INTRAVENOUS | Status: AC
  Filled 2021-08-25: qty 5

## 2021-08-25 MED ORDER — HEPARIN SOD (PORK) LOCK FLUSH 100 UNIT/ML IV SOLN
500.0000 [IU] | Freq: Once | INTRAVENOUS | Status: DC | PRN
  Filled 2021-08-25: qty 5

## 2021-08-25 MED ORDER — HEPARIN SOD (PORK) LOCK FLUSH 100 UNIT/ML IV SOLN
INTRAVENOUS | Status: AC
  Administered 2021-08-25: 500 [IU] via INTRAVENOUS
  Filled 2021-08-25: qty 5

## 2021-08-25 MED ORDER — SODIUM CHLORIDE 0.9 % IV SOLN
Freq: Once | INTRAVENOUS | Status: AC
  Filled 2021-08-25: qty 250

## 2021-08-25 NOTE — Progress Notes (Signed)
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  female with PMH listed below was seen in consultation at the request of  Renee Rival, NP  for evaluation of triple negative breast cancer.  03/30/2021, screening mammogram with 3D 2 nodular areas of asymmetric density demonstrated within the superior lateral aspect of the left breast middle third depth.  Finding was best appreciated on 3D tomosynthesis imaging. Targeted ultrasound showed left upper outer breast 1:30 position 1.1 cm round hypoechoic mass with angular margins. 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 Bary Castilla yesterday.  She presents to establish care with me today Patient did not notice this left mass prior to the mammogram.   Case was discussed on Tumor board.  Pathology reports and mammogram/ultrasound were done at outside facility and results were reviewed and discussed with patient and her husband.  LN status was not mentioned on her [place]. Recommend additional images with mammogram and MRI, neoadjuvant chemotherapy  Family history of breast cancer: Breast cancer in her sister, her late 40s, early 81s; and being paternal grandmother Menarche: 32 Age at first live childbirth: 41  patient has 2 sons and 1 daughter.  Daughter has passed away Used OCP:  Used estrogen and progesterone therapy: Denies History of Radiation to the chest: Denies Previous of breast biopsy: Denies  /25/2022 diagnostic mammogram of left breast showed 2.1 cm biopsy-proven  malignancy in the lateral left breast at 2:00, 4 cm from nipple.  Directly adjacent cystic mass, 1.5 cm in size, consistent with biopsy hematoma.  1 abnormal and 1 borderline abnormal left axillary lymph node  05/18/2021 bilateral breast MRI with and without contrast showed 2.5 x 3 x 2.5 cm hematoma containing biopsy clip artifact within the upper outer left breast.  1.6 cm nodular non-mass-like enhancement 2.5 cm posterior/superior to the biopsy clip may represent biopsy-proven malignancy or additional areas of malignancy.  Single abnormal appearing left axillary lymph node with eccentric cortical thickening.  No MRI evidence of the right breast malignancy.  #05/07/2021, Mediport was placed by Dr. Bary Castilla #05/14/2021 echocardiogram showed LVEF 60-55%  05/20/2021 Slide consultation of her left breast biopsy from outside institution - invasive mammary carcinoma, no special type, grade 3, DCIS and LVI not identified. ER negative, PR negative, HER2 IHC negative. Ki 67 70-80%    05/20/2021 ultrasound-guided left axillary lymph node was negative for malignancy.  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  D15- not done due to cytopenia  INTERVAL HISTORY Peggy Bates is a 55 y.o. female who has above history reviewed by me today presents for follow up visit for management of left triple negative breast cancer Problems and complaints are listed below: Patient is on neoadjuvant chemotherapy  Overall she tolerates well. Denies any new complaints. Neuropathy symptoms are persistent.  She takes gabapentin 600 mg daily.  No nausea vomiting diarrhea.  No fever or chills.  . . Review of Systems  Constitutional:  Negative for appetite change, chills, fatigue and fever.  HENT:   Negative for hearing loss and voice change.    Eyes:  Negative for eye problems.  Respiratory:  Negative for chest tightness and cough.   Cardiovascular:  Negative for chest pain.  Gastrointestinal:  Negative for abdominal distention, abdominal pain and blood in stool.  Endocrine: Negative for hot flashes.  Genitourinary:  Negative for difficulty urinating and frequency.   Musculoskeletal:  Negative for arthralgias.  Skin:  Negative for itching and rash.  Neurological:  Positive for numbness. Negative for extremity weakness.  Hematological:  Negative for adenopathy.  Psychiatric/Behavioral:  Negative for confusion.    MEDICAL HISTORY:  Past Medical History:  Diagnosis Date   Arthritis    knees   COVID-19 12/2020   Depression    Family history of breast cancer    Goiter    Malignant neoplasm of left female breast ([place]) 04/07/2021    SURGICAL HISTORY: Past Surgical History:  Procedure Laterality Date   BREAST BIOPSY Left 04/07/2021   [place] in [place]:u/s bx triple neg   BREAST BIOPSY Left 05/20/2021   [place] Axilla Bx, hydro 3 marker, path pending    KNEE ARTHROSCOPY     PORTACATH PLACEMENT Right 05/07/2021   Procedure: INSERTION PORT-A-CATH;  Surgeon: Robert Bellow, MD;  Location: [facility] ORS;  Service: General;  Laterality: Right;   TOOTH EXTRACTION     TRANSCERVICAL UTERINE FIBROID(S) ABLATION  2010    SOCIAL HISTORY: Social History   Socioeconomic History   Marital status: Married    Spouse name: Not on file   Number of children: Not on file   Years of education: Not on file   Highest education level: Not on file  Occupational History   Not on file  Tobacco Use   Smoking status: Never   Smokeless tobacco: Never  Vaping Use   Vaping Use: Never used  Substance and Sexual Activity   Alcohol use: Never   Drug use: Never   Sexual activity: Yes  Other Topics Concern   Not on file  Social History Narrative   ** Merged History Encounter **       Social Determinants of Health   Financial  Resource Strain: Not on file  Food Insecurity: Not on file  Transportation Needs: Not on file  Physical Activity: Not on file  Stress: Not on file  Social Connections: Not on file  Intimate Partner Violence: Not on file    FAMILY HISTORY: Family History  Problem Relation Age of Onset   Breast cancer Paternal Grandmother    Breast cancer Sister        dx 9s, recurrence x3   Diabetes Sister    Diabetes Father    Throat cancer Maternal Grandmother     ALLERGIES:  has No Known Allergies.  MEDICATIONS:  Current Outpatient Medications  Medication Sig Dispense Refill   chlorhexidine (PERIDEX) 0.12 % solution Use as directed 10 mLs in the mouth or throat 2 (two) times daily. 473 mL 2   cholecalciferol (VITAMIN D) 25 MCG (1000 UNIT) tablet Take 1,000 Units by mouth daily.     dexamethasone (DECADRON) 4 MG tablet Take 2 tablets once a day for 3 days after carboplatin and AC chemotherapy. Take with food. 30 tablet 1   diclofenac Sodium (VOLTAREN) 1 % GEL Apply 1 application topically daily.     gabapentin (NEURONTIN) 600 MG tablet Take 600 mg by mouth every morning.  Glucosamine-Chondroitin (MOVE FREE PO) Take 1 tablet by mouth daily.     levonorgestrel (MIRENA) 20 MCG/DAY IUD 1 each by Intrauterine route once.     lidocaine-prilocaine (EMLA) cream Apply to affected area once 30 g 3   meloxicam (MOBIC) 15 MG tablet Take 15 mg by mouth daily.     ondansetron (ZOFRAN) 8 MG tablet Take 1 tablet (8 mg total) by mouth 2 (two) times daily as needed. Start on the third day after carboplatin and AC chemotherapy. (Patient not taking: No sig reported) 30 tablet 1   phentermine (ADIPEX-P) 37.5 MG tablet 1 tablet (Patient not taking: No sig reported)     prochlorperazine (COMPAZINE) 10 MG tablet Take 1 tablet (10 mg total) by mouth every 6 (six) hours as needed (Nausea or vomiting). (Patient not taking: No sig reported) 30 tablet 1   No current facility-administered medications for this visit.    Facility-Administered Medications Ordered in Other Visits  Medication Dose Route Frequency Provider Last Rate Last Admin   heparin lock flush 100 unit/mL  500 Units Intracatheter Once PRN Earlie Server, MD       sodium chloride flush (NS) 0.9 % injection 10 mL  10 mL Intravenous PRN Earlie Server, MD   10 mL at 08/25/21 0801     PHYSICAL EXAMINATION: ECOG PERFORMANCE STATUS: 0 - Asymptomatic Vitals:   08/25/21 0837  BP: 131/88  Pulse: 81  Resp: 17  Temp: 98.2 F (36.8 C)  SpO2: 100%   Filed Weights   08/25/21 0837  Weight: 240 lb (108.9 kg)    Physical Exam Constitutional:      General: She is not in acute distress. HENT:     Head: Normocephalic and atraumatic.  Eyes:     General: No scleral icterus. Cardiovascular:     Rate and Rhythm: Normal rate and regular rhythm.     Heart sounds: Normal heart sounds.  Pulmonary:     Effort: Pulmonary effort is normal. No respiratory distress.     Breath sounds: No wheezing.  Abdominal:     General: Bowel sounds are normal. There is no distension.     Palpations: Abdomen is soft.  Musculoskeletal:        General: No deformity. Normal range of motion.     Cervical back: Normal range of motion and neck supple.  Skin:    General: Skin is warm and dry.     Findings: No erythema or rash.  Neurological:     Mental Status: She is alert and oriented to person, place, and time. Mental status is at baseline.     Cranial Nerves: No cranial nerve deficit.     Coordination: Coordination normal.  Psychiatric:        Mood and Affect: Mood normal.    LABORATORY DATA:  I have reviewed the data as listed Lab Results  Component Value Date   WBC 5.5 08/25/2021   HGB 10.7 (L) 08/25/2021   HCT 33.1 (L) 08/25/2021   MCV 88.0 08/25/2021   PLT 328 08/25/2021   Recent Labs    08/11/21 0925 08/18/21 0803 08/25/21 0801  NA 135 136 137  K 4.3 4.4 4.2  CL 104 105 106  CO2 '24 25 26  ' GLUCOSE 100* 107* 93  BUN 22* 16 17  CREATININE 0.80 0.69  0.58  CALCIUM 9.4 9.2 9.4  GFRNONAA >60 >60 >60  PROT 7.8 7.7 7.4  ALBUMIN 4.4 4.3 4.1  AST 33 23 26  ALT 51* 30 34  ALKPHOS 86 89 91  BILITOT 0.3 0.2* 0.4    Iron/TIBC/Ferritin/ %Sat No results found for: IRON, TIBC, FERRITIN, IRONPCTSAT    RADIOGRAPHIC STUDIES: I have personally reviewed the radiological images as listed and agreed with the findings in the report. No results found.    ASSESSMENT & PLAN:  1. Carcinoma of upper-outer quadrant of left breast in female, estrogen receptor negative ([place])   2. Encounter for antineoplastic chemotherapy   3. Chemotherapy-induced neutropenia ([facility])   4. Chemotherapy-induced neuropathy ([place])   .Cancer Staging Carcinoma of upper-outer quadrant of left breast in female, estrogen receptor negative ([place]) Staging form: Breast, AJCC 8th Edition - Clinical stage from 04/29/2021: Stage IIB (cT2, cN0, cM0, G3, ER-, PR-, HER2-) - Signed by Earlie Server, MD on 06/04/2021   #Left breast invasive mammary carcinoma, triple negative, + high-grade DCIS ER positive cT2 N0, stage IIB Labs are reviewed and discussed with patient Proceed with cycle 4-day 15 carboplatin, Taxol. Patient gets G-CSF support on day 16 and day 17. Claritin for 4 days Obtain MRI breast around 08/30/2021  #Back pain, patient prefers to defer imaging studies.  Symptoms are stable.  #Therapy induced neutropenia, stable continue close monitor.  She receives short acting G-CSF support.Peridex solution swish and spit BID for prophylaxis.  #Transaminitis, resolved. #Chemotherapy-induced neuropathy, patient is currently on gabapentin 600 mg daily for mood disorder.  She prefers not to further increase gabapentin dosage or add additional agents for neuropathy.  Her symptoms are stable.  Continue to monitor.  Taxol is reduced to 70 mg/m2  Supportive care measures are necessary for patient well-being and will be provided as necessary. We spent sufficient time to discuss many aspect of  care, questions were answered to patient's satisfaction.  All questions were answered. The patient knows to call the clinic with any problems questions or concerns.  cc Renee Rival, NP    Return of visit: 1 week, lab MD ddAC with Onpro   Earlie Server, MD, [name] Hematology Oncology [facility] Pager- [phone] 08/25/2021

## 2021-08-25 NOTE — Patient Instructions (Signed)
[facility] [place] [facility]  Discharge Instructions: Thank you for choosing [facility] to provide your oncology and hematology care.  If you have a lab appointment with the Cancer Center, please go directly to the Cancer Center and check in at the registration area.  Wear comfortable clothing and clothing appropriate for easy access to any Portacath or PICC line.   We strive to give you quality time with your provider. You may need to reschedule your appointment if you arrive late (15 or more minutes).  Arriving late affects you and other patients whose appointments are after yours.  Also, if you miss three or more appointments without notifying the office, you may be dismissed from the clinic at the provider's discretion.      For prescription refill requests, have your pharmacy contact our office and allow 72 hours for refills to be completed.    Today you received the following chemotherapy and/or immunotherapy agents Taxol, carboplatin   To help prevent nausea and vomiting after your treatment, we encourage you to take your nausea medication as directed.  BELOW ARE SYMPTOMS THAT SHOULD BE REPORTED IMMEDIATELY: *FEVER GREATER THAN 100.4 F (38 C) OR HIGHER *CHILLS OR SWEATING *NAUSEA AND VOMITING THAT IS NOT CONTROLLED WITH YOUR NAUSEA MEDICATION *UNUSUAL SHORTNESS OF BREATH *UNUSUAL BRUISING OR BLEEDING *URINARY PROBLEMS (pain or burning when urinating, or frequent urination) *BOWEL PROBLEMS (unusual diarrhea, constipation, pain near the anus) TENDERNESS IN MOUTH AND THROAT WITH OR WITHOUT PRESENCE OF ULCERS (sore throat, sores in mouth, or a toothache) UNUSUAL RASH, SWELLING OR PAIN  UNUSUAL VAGINAL DISCHARGE OR ITCHING   Items with * indicate a potential emergency and should be followed up as soon as possible or go to the Emergency Department if any problems should occur.  Please show the CHEMOTHERAPY ALERT CARD or IMMUNOTHERAPY ALERT CARD at  check-in to the Emergency Department and triage nurse.  Should you have questions after your visit or need to cancel or reschedule your appointment, please contact [facility] [place] [facility]  [phone] and follow the prompts.  Office hours are 8:00 a.m. to 4:30 p.m. Monday - Friday. Please note that voicemails left after 4:00 p.m. may not be returned until the following business day.  We are closed weekends and major holidays. You have access to a nurse at all times for urgent questions. Please call the main number to the clinic [phone] and follow the prompts.  For any non-urgent questions, you may also contact your provider using MyChart. We now offer e-Visits for anyone 18 and older to request care online for non-urgent symptoms. For details visit [email or web address].   Also download the MyChart app! Go to the app store, search "MyChart", open the app, select [facility], and log in with your MyChart username and password.  Due to Covid, a mask is required upon entering the hospital/clinic. If you do not have a mask, one will be given to you upon arrival. For doctor visits, patients may have 1 support person aged 18 or older with them. For treatment visits, patients cannot have anyone with them due to current Covid guidelines and our immunocompromised population.  

## 2021-08-25 NOTE — Progress Notes (Signed)
Patient here for oncology follow-up appointment, expresses no complaints or concerns at this time.    

## 2021-08-26 ENCOUNTER — Inpatient Hospital Stay

## 2021-08-26 DIAGNOSIS — Z5111 Encounter for antineoplastic chemotherapy: Secondary | ICD-10-CM | POA: Diagnosis not present

## 2021-08-26 DIAGNOSIS — C50412 Malignant neoplasm of upper-outer quadrant of left female breast: Secondary | ICD-10-CM

## 2021-08-26 MED ORDER — FILGRASTIM-SNDZ 480 MCG/0.8ML IJ SOSY
480.0000 ug | PREFILLED_SYRINGE | Freq: Once | INTRAMUSCULAR | Status: AC
Start: 2021-08-26 — End: 2021-08-26
  Administered 2021-08-26: 480 ug via SUBCUTANEOUS
  Filled 2021-08-26: qty 0.8

## 2021-08-27 ENCOUNTER — Inpatient Hospital Stay

## 2021-08-27 DIAGNOSIS — Z5111 Encounter for antineoplastic chemotherapy: Secondary | ICD-10-CM | POA: Diagnosis not present

## 2021-08-27 DIAGNOSIS — C50412 Malignant neoplasm of upper-outer quadrant of left female breast: Secondary | ICD-10-CM

## 2021-08-27 DIAGNOSIS — Z171 Estrogen receptor negative status [ER-]: Secondary | ICD-10-CM

## 2021-08-27 MED ORDER — FILGRASTIM-SNDZ 480 MCG/0.8ML IJ SOSY
480.0000 ug | PREFILLED_SYRINGE | Freq: Once | INTRAMUSCULAR | Status: AC
  Administered 2021-08-27: 480 ug via SUBCUTANEOUS
  Filled 2021-08-27: qty 0.8

## 2021-08-30 ENCOUNTER — Ambulatory Visit
Admission: RE | Admit: 2021-08-30 | Discharge: 2021-08-30 | Disposition: A | Discharge status: Home / Self Care | Source: Ambulatory Visit | Attending: Oncology | Admitting: Oncology

## 2021-08-30 ENCOUNTER — Other Ambulatory Visit: Payer: Self-pay

## 2021-08-30 DIAGNOSIS — C50412 Malignant neoplasm of upper-outer quadrant of left female breast: Secondary | ICD-10-CM | POA: Diagnosis present

## 2021-08-30 DIAGNOSIS — Z171 Estrogen receptor negative status [ER-]: Secondary | ICD-10-CM | POA: Diagnosis present

## 2021-08-30 IMAGING — MR MR BREAST BILAT WO/W CM
1 of 8 series · 4 of 48 positions shown · IV contrast (10ml Gadavist)
Comparison: Previous exam(s).

CLINICAL DATA: Follow-up known left breast cancer after neoadjuvant
therapy.

LABS:  None
EXAM:
BILATERAL BREAST MRI WITH AND WITHOUT CONTRAST
TECHNIQUE: Multiplanar, multisequence MR images of both breasts were obtained
prior to and following the intravenous administration of 10 ml of
Gadavist

[Series 3: T2 · axial · B · 3.0mm · 1.08mm/px · z∈[-107,+55]mm · 4 of 46 slices shown]
[im 1/46]
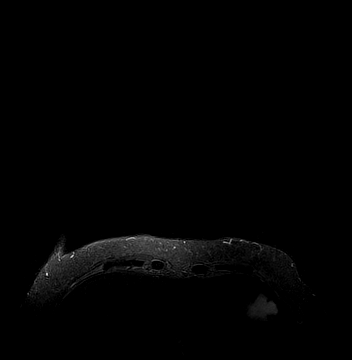
[im 16/46]
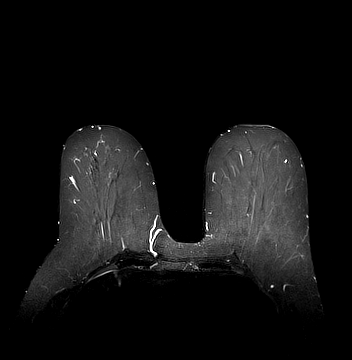
[im 31/46]
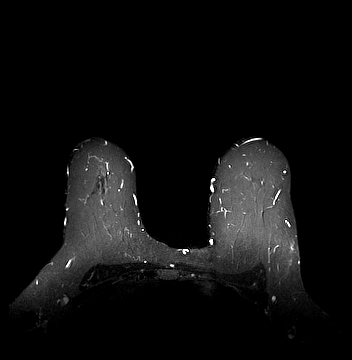
[im 46/46]
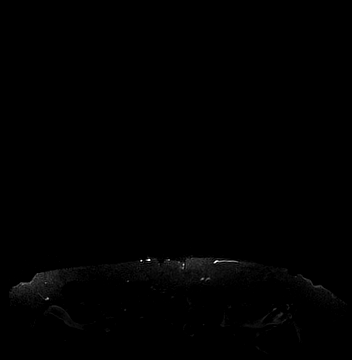

[4 of 48 positions shown; findings below may reference images not displayed]

Three-dimensional MR images were rendered by post-processing of the
original MR data on an independent workstation. The
three-dimensional MR images were interpreted, and findings are
reported in the following complete MRI report for this study. Three
dimensional images were evaluated at the independent interpreting
workstation using the DynaCAD thin client.
FINDINGS: Breast composition: b. Scattered fibroglandular tissue.

Background parenchymal enhancement: Minimal

Right breast: No mass or abnormal enhancement.

Left breast: No abnormal enhancement remains in the left breast.
Specifically, the previously identified invasive breast cancer on
the left is no longer visualized. The previously biopsied DCIS is no
longer visualized.

Lymph nodes: A clip is seen in the previously biopsied left axillary
lymph node. No change in lymph nodes identified.

Ancillary findings:  None.
IMPRESSION: No abnormal enhancement remains in the left breast. Complete
response to chemotherapy is identified. No MRI evidence of
malignancy in either breast.

RECOMMENDATION:
Recommend continued surgical and oncologic follow up.

BI-RADS CATEGORY  6: Known biopsy-proven malignancy.

## 2021-08-30 MED ORDER — GADOBUTROL 1 MMOL/ML IV SOLN
10.0000 mL | Freq: Once | INTRAVENOUS | Status: AC | PRN
  Administered 2021-08-30: 10 mL via INTRAVENOUS

## 2021-09-01 ENCOUNTER — Inpatient Hospital Stay

## 2021-09-01 ENCOUNTER — Inpatient Hospital Stay (HOSPITAL_BASED_OUTPATIENT_CLINIC_OR_DEPARTMENT_OTHER): Admitting: Oncology

## 2021-09-01 ENCOUNTER — Encounter: Payer: Self-pay | Admitting: Oncology

## 2021-09-01 VITALS — BP 134/79 | HR 92 | Temp 97.8°F | Resp 16 | Wt 238.0 lb

## 2021-09-01 DIAGNOSIS — G620 Drug-induced polyneuropathy: Secondary | ICD-10-CM | POA: Diagnosis not present

## 2021-09-01 DIAGNOSIS — D701 Agranulocytosis secondary to cancer chemotherapy: Secondary | ICD-10-CM

## 2021-09-01 DIAGNOSIS — C50412 Malignant neoplasm of upper-outer quadrant of left female breast: Secondary | ICD-10-CM

## 2021-09-01 DIAGNOSIS — T451X5A Adverse effect of antineoplastic and immunosuppressive drugs, initial encounter: Secondary | ICD-10-CM

## 2021-09-01 DIAGNOSIS — Z5111 Encounter for antineoplastic chemotherapy: Secondary | ICD-10-CM | POA: Diagnosis not present

## 2021-09-01 DIAGNOSIS — Z171 Estrogen receptor negative status [ER-]: Secondary | ICD-10-CM

## 2021-09-01 LAB — COMPREHENSIVE METABOLIC PANEL
ALT: 38 U/L (ref 0–44)
AST: 31 U/L (ref 15–41)
Albumin: 4.4 g/dL (ref 3.5–5.0)
Alkaline Phosphatase: 92 U/L (ref 38–126)
Anion gap: 7 (ref 5–15)
BUN: 15 mg/dL (ref 6–20)
CO2: 26 mmol/L (ref 22–32)
Calcium: 9.5 mg/dL (ref 8.9–10.3)
Chloride: 105 mmol/L (ref 98–111)
Creatinine, Ser: 0.7 mg/dL (ref 0.44–1.00)
GFR, Estimated: >60 mL/min (ref >60)
Glucose, Bld: 126 mg/dL — ABNORMAL HIGH (ref 70–99)
Potassium: 4.1 mmol/L (ref 3.5–5.1)
Sodium: 138 mmol/L (ref 135–145)
Total Bilirubin: 0.5 mg/dL (ref 0.3–1.2)
Total Protein: 7.8 g/dL (ref 6.5–8.1)

## 2021-09-01 LAB — CBC WITH DIFFERENTIAL/PLATELET
Abs Immature Granulocytes: 0.08 10*3/uL — ABNORMAL HIGH (ref 0.00–0.07)
Basophils Absolute: 0 10*3/uL (ref 0.0–0.1)
Basophils Relative: 0 %
Eosinophils Absolute: 0 10*3/uL (ref 0.0–0.5)
Eosinophils Relative: 0 %
HCT: 36.2 % (ref 36.0–46.0)
Hemoglobin: 11.5 g/dL — ABNORMAL LOW (ref 12.0–15.0)
Immature Granulocytes: 2 %
Lymphocytes Relative: 37 %
Lymphs Abs: 1.7 10*3/uL (ref 0.7–4.0)
MCH: 28 pg (ref 26.0–34.0)
MCHC: 31.8 g/dL (ref 30.0–36.0)
MCV: 88.3 fL (ref 80.0–100.0)
Monocytes Absolute: 0.6 10*3/uL (ref 0.1–1.0)
Monocytes Relative: 14 %
Neutro Abs: 2.1 10*3/uL (ref 1.7–7.7)
Neutrophils Relative %: 47 %
Platelets: 235 10*3/uL (ref 150–400)
RBC: 4.1 MIL/uL (ref 3.87–5.11)
RDW: 18.1 % — ABNORMAL HIGH (ref 11.5–15.5)
Smear Review: NORMAL
WBC: 4.5 10*3/uL (ref 4.0–10.5)
nRBC: 0 % (ref 0.0–0.2)

## 2021-09-01 MED ORDER — SODIUM CHLORIDE 0.9 % IV SOLN
600.0000 mg/m2 | Freq: Once | INTRAVENOUS | Status: AC
  Administered 2021-09-01: 1320 mg via INTRAVENOUS
  Filled 2021-09-01: qty 66

## 2021-09-01 MED ORDER — HEPARIN SOD (PORK) LOCK FLUSH 100 UNIT/ML IV SOLN
INTRAVENOUS | Status: AC
  Administered 2021-09-01: 500 [IU]
  Filled 2021-09-01: qty 5

## 2021-09-01 MED ORDER — PALONOSETRON HCL INJECTION 0.25 MG/5ML
0.2500 mg | Freq: Once | INTRAVENOUS | Status: AC
  Administered 2021-09-01: 0.25 mg via INTRAVENOUS
  Filled 2021-09-01: qty 5

## 2021-09-01 MED ORDER — DOXORUBICIN HCL CHEMO IV INJECTION 2 MG/ML
60.0000 mg/m2 | Freq: Once | INTRAVENOUS | Status: AC
  Administered 2021-09-01: 132 mg via INTRAVENOUS
  Filled 2021-09-01: qty 50

## 2021-09-01 MED ORDER — SODIUM CHLORIDE 0.9 % IV SOLN
Freq: Once | INTRAVENOUS | Status: AC
  Filled 2021-09-01: qty 250

## 2021-09-01 MED ORDER — HEPARIN SOD (PORK) LOCK FLUSH 100 UNIT/ML IV SOLN
500.0000 [IU] | Freq: Once | INTRAVENOUS | Status: AC | PRN
  Filled 2021-09-01: qty 5

## 2021-09-01 MED ORDER — SODIUM CHLORIDE 0.9 % IV SOLN
10.0000 mg | Freq: Once | INTRAVENOUS | Status: AC
  Administered 2021-09-01: 10 mg via INTRAVENOUS
  Filled 2021-09-01: qty 10

## 2021-09-01 MED ORDER — SODIUM CHLORIDE 0.9 % IV SOLN
150.0000 mg | Freq: Once | INTRAVENOUS | Status: AC
  Administered 2021-09-01: 150 mg via INTRAVENOUS
  Filled 2021-09-01: qty 150

## 2021-09-01 MED ORDER — SODIUM CHLORIDE 0.9% FLUSH
10.0000 mL | Freq: Once | INTRAVENOUS | Status: AC
  Administered 2021-09-01: 10 mL via INTRAVENOUS
  Filled 2021-09-01: qty 10

## 2021-09-01 MED ORDER — PEGFILGRASTIM 6 MG/0.6ML ~~LOC~~ PSKT
6.0000 mg | PREFILLED_SYRINGE | Freq: Once | SUBCUTANEOUS | Status: AC
  Administered 2021-09-01: 6 mg via SUBCUTANEOUS
  Filled 2021-09-01: qty 0.6

## 2021-09-01 NOTE — Progress Notes (Signed)
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  female with PMH listed below was seen in consultation at the request of  Renee Rival, NP  for evaluation of triple negative breast cancer.  03/30/2021, screening mammogram with 3D 2 nodular areas of asymmetric density demonstrated within the superior lateral aspect of the left breast middle third depth.  Finding was best appreciated on 3D tomosynthesis imaging. Targeted ultrasound showed left upper outer breast 1:30 position 1.1 cm round hypoechoic mass with angular margins. 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 Bary Castilla yesterday.  She presents to establish care with me today Patient did not notice this left mass prior to the mammogram.   Case was discussed on Tumor board.  Pathology reports and mammogram/ultrasound were done at outside facility and results were reviewed and discussed with patient and her husband.  LN status was not mentioned on her [place]. Recommend additional images with mammogram and MRI, neoadjuvant chemotherapy  Family history of breast cancer: Breast cancer in her sister, her late 93s, early 25s; and being paternal grandmother Menarche: 5 Age at first live childbirth: 89  patient has 2 sons and 1 daughter.  Daughter has passed away Used OCP:  Used estrogen and progesterone therapy: Denies History of Radiation to the chest: Denies Previous of breast biopsy: Denies  /25/2022 diagnostic mammogram of left breast showed 2.1 cm biopsy-proven  malignancy in the lateral left breast at 2:00, 4 cm from nipple.  Directly adjacent cystic mass, 1.5 cm in size, consistent with biopsy hematoma.  1 abnormal and 1 borderline abnormal left axillary lymph node  05/18/2021 bilateral breast MRI with and without contrast showed 2.5 x 3 x 2.5 cm hematoma containing biopsy clip artifact within the upper outer left breast.  1.6 cm nodular non-mass-like enhancement 2.5 cm posterior/superior to the biopsy clip may represent biopsy-proven malignancy or additional areas of malignancy.  Single abnormal appearing left axillary lymph node with eccentric cortical thickening.  No MRI evidence of the right breast malignancy.  #05/07/2021, Mediport was placed by Dr. Bary Castilla #05/14/2021 echocardiogram showed LVEF 60-55%  05/20/2021 Slide consultation of her left breast biopsy from outside institution - invasive mammary carcinoma, no special type, grade 3, DCIS and LVI not identified. ER negative, PR negative, HER2 IHC negative. Ki 67 70-80%    05/20/2021 ultrasound-guided left axillary lymph node was negative for malignancy.  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  D15- not done due to cytopenia  INTERVAL HISTORY Peggy Bates is a 55 y.o. female who has above history reviewed by me today presents for follow up visit for management of left triple negative breast cancer Patient is on neoadjuvant chemotherapy tolerates well. Had interval MRI breast done. 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  She takes gabapentin 600 mg daily.  No nausea vomiting diarrhea.  No fever or chills.  . . Review of Systems  Constitutional:  Negative for appetite change,  chills, fatigue and fever.  HENT:   Negative for hearing loss and voice change.   Eyes:  Negative for eye problems.   Respiratory:  Negative for chest tightness and cough.   Cardiovascular:  Negative for chest pain.  Gastrointestinal:  Negative for abdominal distention, abdominal pain and blood in stool.  Endocrine: Negative for hot flashes.  Genitourinary:  Negative for difficulty urinating and frequency.   Musculoskeletal:  Negative for arthralgias.  Skin:  Negative for itching and rash.  Neurological:  Positive for numbness. Negative for extremity weakness.  Hematological:  Negative for adenopathy.  Psychiatric/Behavioral:  Negative for confusion.    MEDICAL HISTORY:  Past Medical History:  Diagnosis Date   Arthritis    knees   COVID-19 12/2020   Depression    Family history of breast cancer    Goiter    Malignant neoplasm of left female breast ([place]) 04/07/2021    SURGICAL HISTORY: Past Surgical History:  Procedure Laterality Date   BREAST BIOPSY Left 04/07/2021   [facility] in [place]:u/s bx triple neg   BREAST BIOPSY Left 05/20/2021   [place] Axilla Bx, hydro 3 marker, path pending    KNEE ARTHROSCOPY     PORTACATH PLACEMENT Right 05/07/2021   Procedure: INSERTION PORT-A-CATH;  Surgeon: Robert Bellow, MD;  Location: [facility] ORS;  Service: General;  Laterality: Right;   TOOTH EXTRACTION     TRANSCERVICAL UTERINE FIBROID(S) ABLATION  2010    SOCIAL HISTORY: Social History   Socioeconomic History   Marital status: Married    Spouse name: Not on file   Number of children: Not on file   Years of education: Not on file   Highest education level: Not on file  Occupational History   Not on file  Tobacco Use   Smoking status: Never   Smokeless tobacco: Never  Vaping Use   Vaping Use: Never used  Substance and Sexual Activity   Alcohol use: Never   Drug use: Never   Sexual activity: Yes  Other Topics Concern   Not on file  Social History Narrative   ** Merged History Encounter **       Social Determinants of Health   Financial Resource Strain: Not on file  Food  Insecurity: Not on file  Transportation Needs: Not on file  Physical Activity: Not on file  Stress: Not on file  Social Connections: Not on file  Intimate Partner Violence: Not on file    FAMILY HISTORY: Family History  Problem Relation Age of Onset   Breast cancer Paternal Grandmother    Breast cancer Sister        dx 42s, recurrence x3   Diabetes Sister    Diabetes Father    Throat cancer Maternal Grandmother     ALLERGIES:  has No Known Allergies.  MEDICATIONS:  Current Outpatient Medications  Medication Sig Dispense Refill   chlorhexidine (PERIDEX) 0.12 % solution Use as directed 10 mLs in the mouth or throat 2 (two) times daily. 473 mL 2   cholecalciferol (VITAMIN D) 25 MCG (1000 UNIT) tablet Take 1,000 Units by mouth daily.     dexamethasone (DECADRON) 4 MG tablet Take 2 tablets once a day for 3 days after carboplatin and AC chemotherapy. Take with food. 30 tablet 1   diclofenac Sodium (VOLTAREN) 1 % GEL Apply 1 application topically daily.     gabapentin (NEURONTIN) 600 MG tablet Take 600 mg by mouth every morning.     Glucosamine-Chondroitin (MOVE FREE PO) Take  1 tablet by mouth daily.     levonorgestrel (MIRENA) 20 MCG/DAY IUD 1 each by Intrauterine route once.     lidocaine-prilocaine (EMLA) cream Apply to affected area once 30 g 3   meloxicam (MOBIC) 15 MG tablet Take 15 mg by mouth daily.     ondansetron (ZOFRAN) 8 MG tablet Take 1 tablet (8 mg total) by mouth 2 (two) times daily as needed. Start on the third day after carboplatin and AC chemotherapy. (Patient not taking: No sig reported) 30 tablet 1   phentermine (ADIPEX-P) 37.5 MG tablet 1 tablet (Patient not taking: No sig reported)     prochlorperazine (COMPAZINE) 10 MG tablet Take 1 tablet (10 mg total) by mouth every 6 (six) hours as needed (Nausea or vomiting). (Patient not taking: No sig reported) 30 tablet 1   No current facility-administered medications for this visit.     PHYSICAL EXAMINATION: ECOG  PERFORMANCE STATUS: 0 - Asymptomatic Vitals:   09/01/21 0919  BP: 134/79  Pulse: 92  Resp: 16  Temp: 97.8 F (36.6 C)  SpO2: 99%   Filed Weights   09/01/21 0919  Weight: 238 lb (108 kg)    Physical Exam Constitutional:      General: She is not in acute distress. HENT:     Head: Normocephalic and atraumatic.  Eyes:     General: No scleral icterus. Cardiovascular:     Rate and Rhythm: Normal rate and regular rhythm.     Heart sounds: Normal heart sounds.  Pulmonary:     Effort: Pulmonary effort is normal. No respiratory distress.     Breath sounds: No wheezing.  Abdominal:     General: Bowel sounds are normal. There is no distension.     Palpations: Abdomen is soft.  Musculoskeletal:        General: No deformity. Normal range of motion.     Cervical back: Normal range of motion and neck supple.  Skin:    General: Skin is warm and dry.     Findings: No erythema or rash.  Neurological:     Mental Status: She is alert and oriented to person, place, and time. Mental status is at baseline.     Cranial Nerves: No cranial nerve deficit.     Coordination: Coordination normal.  Psychiatric:        Mood and Affect: Mood normal.    LABORATORY DATA:  I have reviewed the data as listed Lab Results  Component Value Date   WBC 4.5 09/01/2021   HGB 11.5 (L) 09/01/2021   HCT 36.2 09/01/2021   MCV 88.3 09/01/2021   PLT 235 09/01/2021   Recent Labs    08/18/21 0803 08/25/21 0801 09/01/21 0836  NA 136 137 138  K 4.4 4.2 4.1  CL 105 106 105  CO2 '25 26 26  ' GLUCOSE 107* 93 126*  BUN '16 17 15  ' CREATININE 0.69 0.58 0.70  CALCIUM 9.2 9.4 9.5  GFRNONAA >60 >60 >60  PROT 7.7 7.4 7.8  ALBUMIN 4.3 4.1 4.4  AST '23 26 31  ' ALT 30 34 38  ALKPHOS 89 91 92  BILITOT 0.2* 0.4 0.5    Iron/TIBC/Ferritin/ %Sat No results found for: IRON, TIBC, FERRITIN, IRONPCTSAT    RADIOGRAPHIC STUDIES: I have personally reviewed the radiological images as listed and agreed with the findings  in the report. MR BREAST BILATERAL W WO CONTRAST INC CAD  Result Date: 08/31/2021 CLINICAL DATA:  Follow-up known left breast cancer after neoadjuvant therapy. LABS:  None EXAM: BILATERAL BREAST MRI WITH AND WITHOUT CONTRAST TECHNIQUE: Multiplanar, multisequence MR images of both breasts were obtained prior to and following the intravenous administration of 10 ml of Gadavist Three-dimensional MR images were rendered by post-processing of the original MR data on an independent workstation. The three-dimensional MR images were interpreted, and findings are reported in the following complete MRI report for this study. Three dimensional images were evaluated at the independent interpreting workstation using the DynaCAD thin client. COMPARISON:  Previous exam(s). FINDINGS: Breast composition: b. Scattered fibroglandular tissue. Background parenchymal enhancement: Minimal Right breast: No mass or abnormal enhancement. Left breast: No abnormal enhancement remains in the left breast. Specifically, the previously identified invasive breast cancer on the left is no longer visualized. The previously biopsied DCIS is no longer visualized. Lymph nodes: A clip is seen in the previously biopsied left axillary lymph node. No change in lymph nodes identified. Ancillary findings:  None. IMPRESSION: No abnormal enhancement remains in the left breast. Complete response to chemotherapy is identified. No MRI evidence of malignancy in either breast. RECOMMENDATION: Recommend continued surgical and oncologic follow up. BI-RADS CATEGORY  6: Known biopsy-proven malignancy. Electronically Signed   By: Dorise Bullion III M.D.   On: 08/31/2021 16:23     ASSESSMENT & PLAN:  1. Encounter for antineoplastic chemotherapy   2. Chemotherapy-induced neutropenia (HCC)   3. Chemotherapy-induced neuropathy (HCC)   4. Carcinoma of upper-outer quadrant of left breast in female, estrogen receptor negative ([place])   .Cancer Staging Carcinoma of  upper-outer quadrant of left breast in female, estrogen receptor negative ([place]) Staging form: Breast, AJCC 8th Edition - Clinical stage from 04/29/2021: Stage IIB (cT2, cN0, cM0, G3, ER-, PR-, HER2-) - Signed by Earlie Server, MD on 06/04/2021   #Left breast invasive mammary carcinoma, triple negative, + high-grade DCIS ER positive cT2 N0, stage IIB 08/30/2021 MRI breast showed no abnormal enhancement remains in the left breast. Complete response to chemotherapy is identified. No MRI evidence of malignancy in either breast Labs are reviewed and discussed with patient. Proceed with cycle 1 DD AC with On pro. Recommend Claritin daily for 4 days.  #Back pain, patient prefers to defer imaging studies.  Symptoms are stable.  #Therapy induced neutropenia, stable continue close monitor.   Patient gets G-CSF prophylactically.  #Transaminitis, resolved. #Chemotherapy-induced neuropathy, patient is currently on gabapentin 600 mg daily for mood disorder.  She prefers not to further increase gabapentin dosage or add additional agents for neuropathy.  Her symptoms are stable.  Continue to monitor.     Supportive care measures are necessary for patient well-being and will be provided as necessary. We spent sufficient time to discuss many aspect of care, questions were answered to patient's satisfaction.  All questions were answered. The patient knows to call the clinic with any problems questions or concerns.  cc Renee Rival, NP    Return of visit: 2 weeks, lab MD ddAC with Onpro   Earlie Server, MD, [name] Hematology Oncology [facility] Pager- [phone] 09/01/2021

## 2021-09-01 NOTE — Patient Instructions (Addendum)
[facility]   Discharge Instructions: Thank you for choosing [facility] to provide your oncology and hematology care.  If you have a lab appointment with the [facility], please go directly to the [facility] and check in at the registration area.  Wear comfortable clothing and clothing appropriate for easy access to any Portacath or PICC line.   We strive to give you quality time with your provider. You may need to reschedule your appointment if you arrive late (15 or more minutes).  Arriving late affects you and other patients whose appointments are after yours.  Also, if you miss three or more appointments without notifying the office, you may be dismissed from the clinic at the provider's discretion.      For prescription refill requests, have your pharmacy contact our office and allow 72 hours for refills to be completed.    Today you received the following chemotherapy and/or immunotherapy agents: Adriamycin and Cytoxan. Today you also received the following: Neulasta Onpro Kit.   To help prevent nausea and vomiting after your treatment, we encourage you to take your nausea medication as directed.  BELOW ARE SYMPTOMS THAT SHOULD BE REPORTED IMMEDIATELY: *FEVER GREATER THAN 100.4 F (38 C) OR HIGHER *CHILLS OR SWEATING *NAUSEA AND VOMITING THAT IS NOT CONTROLLED WITH YOUR NAUSEA MEDICATION *UNUSUAL SHORTNESS OF BREATH *UNUSUAL BRUISING OR BLEEDING *URINARY PROBLEMS (pain or burning when urinating, or frequent urination) *BOWEL PROBLEMS (unusual diarrhea, constipation, pain near the anus) TENDERNESS IN MOUTH AND THROAT WITH OR WITHOUT PRESENCE OF ULCERS (sore throat, sores in mouth, or a toothache) UNUSUAL RASH, SWELLING OR PAIN  UNUSUAL VAGINAL DISCHARGE OR ITCHING   Items with * indicate a potential emergency and should be followed up as soon as possible or go to the Emergency Department if any problems should occur.  Please  show the CHEMOTHERAPY ALERT CARD or IMMUNOTHERAPY ALERT CARD at check-in to the Emergency Department and triage nurse.  Should you have questions after your visit or need to cancel or reschedule your appointment, please contact [facility]  [phone] and follow the prompts.  Office hours are 8:00 a.m. to 4:30 p.m. Monday - Friday. Please note that voicemails left after 4:00 p.m. may not be returned until the following business day.  We are closed weekends and major holidays. You have access to a nurse at all times for urgent questions. Please call the main number to the clinic [phone] and follow the prompts.  For any non-urgent questions, you may also contact your provider using MyChart. We now offer e-Visits for anyone 60 and older to request care online for non-urgent symptoms. For details visit [email or web address].   Also download the MyChart app! Go to the app store, search "MyChart", open the app, select [facility], and log in with your MyChart username and password.  Due to Covid, a mask is required upon entering the hospital/clinic. If you do not have a mask, one will be given to you upon arrival. For doctor visits, patients may have 1 support person aged 65 or older with them. For treatment visits, patients cannot have anyone with them due to current Covid guidelines and our immunocompromised population.

## 2021-09-01 NOTE — Progress Notes (Signed)
Pt in for follow up and new treatment regime today.  Denies any concerns today.

## 2021-09-01 NOTE — Progress Notes (Signed)
Keytruda/AC ordered as treatment plan.  Keytruda not ordered with AC on 09/01/21.  No Beryle Flock today per MD

## 2021-09-08 ENCOUNTER — Inpatient Hospital Stay

## 2021-09-08 DIAGNOSIS — C50412 Malignant neoplasm of upper-outer quadrant of left female breast: Secondary | ICD-10-CM

## 2021-09-08 DIAGNOSIS — Z171 Estrogen receptor negative status [ER-]: Secondary | ICD-10-CM

## 2021-09-08 DIAGNOSIS — Z5111 Encounter for antineoplastic chemotherapy: Secondary | ICD-10-CM | POA: Diagnosis not present

## 2021-09-08 LAB — CBC WITH DIFFERENTIAL/PLATELET
Abs Immature Granulocytes: 0.02 10*3/uL (ref 0.00–0.07)
Basophils Absolute: 0 10*3/uL (ref 0.0–0.1)
Basophils Relative: 2 %
Eosinophils Absolute: 0 10*3/uL (ref 0.0–0.5)
Eosinophils Relative: 1 %
HCT: 31.2 % — ABNORMAL LOW (ref 36.0–46.0)
Hemoglobin: 10 g/dL — ABNORMAL LOW (ref 12.0–15.0)
Immature Granulocytes: 1 %
Lymphocytes Relative: 50 %
Lymphs Abs: 1 10*3/uL (ref 0.7–4.0)
MCH: 28.6 pg (ref 26.0–34.0)
MCHC: 32.1 g/dL (ref 30.0–36.0)
MCV: 89.1 fL (ref 80.0–100.0)
Monocytes Absolute: 0.1 10*3/uL (ref 0.1–1.0)
Monocytes Relative: 5 %
Neutro Abs: 0.8 10*3/uL — ABNORMAL LOW (ref 1.7–7.7)
Neutrophils Relative %: 41 %
Platelets: 81 10*3/uL — ABNORMAL LOW (ref 150–400)
RBC: 3.5 MIL/uL — ABNORMAL LOW (ref 3.87–5.11)
RDW: 17.7 % — ABNORMAL HIGH (ref 11.5–15.5)
Smear Review: NORMAL
WBC: 1.9 10*3/uL — ABNORMAL LOW (ref 4.0–10.5)
nRBC: 0 % (ref 0.0–0.2)

## 2021-09-08 LAB — COMPREHENSIVE METABOLIC PANEL
ALT: 48 U/L — ABNORMAL HIGH (ref 0–44)
AST: 32 U/L (ref 15–41)
Albumin: 4.2 g/dL (ref 3.5–5.0)
Alkaline Phosphatase: 111 U/L (ref 38–126)
Anion gap: 6 (ref 5–15)
BUN: 16 mg/dL (ref 6–20)
CO2: 28 mmol/L (ref 22–32)
Calcium: 9.5 mg/dL (ref 8.9–10.3)
Chloride: 102 mmol/L (ref 98–111)
Creatinine, Ser: 0.62 mg/dL (ref 0.44–1.00)
GFR, Estimated: >60 mL/min (ref >60)
Glucose, Bld: 101 mg/dL — ABNORMAL HIGH (ref 70–99)
Potassium: 4.6 mmol/L (ref 3.5–5.1)
Sodium: 136 mmol/L (ref 135–145)
Total Bilirubin: 0.8 mg/dL (ref 0.3–1.2)
Total Protein: 7.3 g/dL (ref 6.5–8.1)

## 2021-09-15 ENCOUNTER — Inpatient Hospital Stay

## 2021-09-15 ENCOUNTER — Inpatient Hospital Stay (HOSPITAL_BASED_OUTPATIENT_CLINIC_OR_DEPARTMENT_OTHER): Admitting: Oncology

## 2021-09-15 ENCOUNTER — Encounter: Payer: Self-pay | Admitting: Oncology

## 2021-09-15 VITALS — BP 111/69 | HR 80 | Temp 98.4°F | Resp 16 | Wt 240.0 lb

## 2021-09-15 DIAGNOSIS — D696 Thrombocytopenia, unspecified: Secondary | ICD-10-CM | POA: Diagnosis not present

## 2021-09-15 DIAGNOSIS — D701 Agranulocytosis secondary to cancer chemotherapy: Secondary | ICD-10-CM

## 2021-09-15 DIAGNOSIS — M549 Dorsalgia, unspecified: Secondary | ICD-10-CM

## 2021-09-15 DIAGNOSIS — G620 Drug-induced polyneuropathy: Secondary | ICD-10-CM

## 2021-09-15 DIAGNOSIS — Z171 Estrogen receptor negative status [ER-]: Secondary | ICD-10-CM

## 2021-09-15 DIAGNOSIS — T451X5D Adverse effect of antineoplastic and immunosuppressive drugs, subsequent encounter: Secondary | ICD-10-CM

## 2021-09-15 DIAGNOSIS — C50412 Malignant neoplasm of upper-outer quadrant of left female breast: Secondary | ICD-10-CM | POA: Diagnosis not present

## 2021-09-15 DIAGNOSIS — Z5111 Encounter for antineoplastic chemotherapy: Secondary | ICD-10-CM

## 2021-09-15 DIAGNOSIS — D61810 Antineoplastic chemotherapy induced pancytopenia: Secondary | ICD-10-CM

## 2021-09-15 DIAGNOSIS — Z95828 Presence of other vascular implants and grafts: Secondary | ICD-10-CM

## 2021-09-15 DIAGNOSIS — T451X5A Adverse effect of antineoplastic and immunosuppressive drugs, initial encounter: Secondary | ICD-10-CM

## 2021-09-15 LAB — CBC WITH DIFFERENTIAL/PLATELET
Abs Immature Granulocytes: 0.02 10*3/uL (ref 0.00–0.07)
Basophils Absolute: 0 10*3/uL (ref 0.0–0.1)
Basophils Relative: 0 %
Eosinophils Absolute: 0 10*3/uL (ref 0.0–0.5)
Eosinophils Relative: 1 %
HCT: 30 % — ABNORMAL LOW (ref 36.0–46.0)
Hemoglobin: 9.6 g/dL — ABNORMAL LOW (ref 12.0–15.0)
Immature Granulocytes: 1 %
Lymphocytes Relative: 38 %
Lymphs Abs: 1 10*3/uL (ref 0.7–4.0)
MCH: 29 pg (ref 26.0–34.0)
MCHC: 32 g/dL (ref 30.0–36.0)
MCV: 90.6 fL (ref 80.0–100.0)
Monocytes Absolute: 0.2 10*3/uL (ref 0.1–1.0)
Monocytes Relative: 7 %
Neutro Abs: 1.4 10*3/uL — ABNORMAL LOW (ref 1.7–7.7)
Neutrophils Relative %: 53 %
Platelets: 52 10*3/uL — ABNORMAL LOW (ref 150–400)
RBC: 3.31 MIL/uL — ABNORMAL LOW (ref 3.87–5.11)
RDW: 18.2 % — ABNORMAL HIGH (ref 11.5–15.5)
Smear Review: NORMAL
WBC: 2.6 10*3/uL — ABNORMAL LOW (ref 4.0–10.5)
nRBC: 0 % (ref 0.0–0.2)

## 2021-09-15 LAB — COMPREHENSIVE METABOLIC PANEL
ALT: 28 U/L (ref 0–44)
AST: 22 U/L (ref 15–41)
Albumin: 4.2 g/dL (ref 3.5–5.0)
Alkaline Phosphatase: 99 U/L (ref 38–126)
Anion gap: 6 (ref 5–15)
BUN: 15 mg/dL (ref 6–20)
CO2: 24 mmol/L (ref 22–32)
Calcium: 9.2 mg/dL (ref 8.9–10.3)
Chloride: 105 mmol/L (ref 98–111)
Creatinine, Ser: 0.64 mg/dL (ref 0.44–1.00)
GFR, Estimated: >60 mL/min (ref >60)
Glucose, Bld: 91 mg/dL (ref 70–99)
Potassium: 4.1 mmol/L (ref 3.5–5.1)
Sodium: 135 mmol/L (ref 135–145)
Total Bilirubin: 0.8 mg/dL (ref 0.3–1.2)
Total Protein: 7.6 g/dL (ref 6.5–8.1)

## 2021-09-15 MED ORDER — SODIUM CHLORIDE 0.9% FLUSH
10.0000 mL | Freq: Once | INTRAVENOUS | Status: AC
  Filled 2021-09-15: qty 10

## 2021-09-15 MED ORDER — DEXAMETHASONE 4 MG PO TABS: ORAL_TABLET | ORAL | 0 refills | Status: DC

## 2021-09-15 MED ORDER — HEPARIN SOD (PORK) LOCK FLUSH 100 UNIT/ML IV SOLN
500.0000 [IU] | Freq: Once | INTRAVENOUS | Status: AC
  Filled 2021-09-15: qty 5

## 2021-09-15 MED ORDER — SODIUM CHLORIDE 0.9% FLUSH
10.0000 mL | Freq: Once | INTRAVENOUS | Status: AC
  Administered 2021-09-15: 10 mL via INTRAVENOUS
  Filled 2021-09-15: qty 10

## 2021-09-15 MED ORDER — HEPARIN SOD (PORK) LOCK FLUSH 100 UNIT/ML IV SOLN
500.0000 [IU] | Freq: Once | INTRAVENOUS | Status: AC
  Administered 2021-09-15: 500 [IU] via INTRAVENOUS
  Filled 2021-09-15: qty 5

## 2021-09-15 NOTE — Progress Notes (Signed)
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  female with PMH listed below was seen in consultation at the request of  Renee Rival, NP  for evaluation of triple negative breast cancer.  03/30/2021, screening mammogram with 3D 2 nodular areas of asymmetric density demonstrated within the superior lateral aspect of the left breast middle third depth.  Finding was best appreciated on 3D tomosynthesis imaging. Targeted ultrasound showed left upper outer breast 1:30 position 1.1 cm round hypoechoic mass with angular margins. 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 Bary Castilla yesterday.  She presents to establish care with me today Patient did not notice this left mass prior to the mammogram.   Case was discussed on Tumor board.  Pathology reports and mammogram/ultrasound were done at outside facility and results were reviewed and discussed with patient and her husband.  LN status was not mentioned on her [place]. Recommend additional images with mammogram and MRI, neoadjuvant chemotherapy  Family history of breast cancer: Breast cancer in her sister, her late 81s, early 32s; and being paternal grandmother Menarche: 67 Age at first live childbirth: 26  patient has 2 sons and 1 daughter.  Daughter has passed away Used OCP:  Used estrogen and progesterone therapy: Denies History of Radiation to the chest: Denies Previous of breast biopsy: Denies  /25/2022 diagnostic mammogram of left breast showed 2.1 cm biopsy-proven  malignancy in the lateral left breast at 2:00, 4 cm from nipple.  Directly adjacent cystic mass, 1.5 cm in size, consistent with biopsy hematoma.  1 abnormal and 1 borderline abnormal left axillary lymph node  05/18/2021 bilateral breast MRI with and without contrast showed 2.5 x 3 x 2.5 cm hematoma containing biopsy clip artifact within the upper outer left breast.  1.6 cm nodular non-mass-like enhancement 2.5 cm posterior/superior to the biopsy clip may represent biopsy-proven malignancy or additional areas of malignancy.  Single abnormal appearing left axillary lymph node with eccentric cortical thickening.  No MRI evidence of the right breast malignancy.  #05/07/2021, Mediport was placed by Dr. Bary Castilla #05/14/2021 echocardiogram showed LVEF 60-55%  05/20/2021 Slide consultation of her left breast biopsy from outside institution - invasive mammary carcinoma, no special type, grade 3, DCIS and LVI not identified. ER negative, PR negative, HER2 IHC negative. Ki 67 70-80%    05/20/2021 ultrasound-guided left axillary lymph node was negative for malignancy.  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  D15- not done due to cytopenia  INTERVAL HISTORY Peggy Bates is a 55 y.o. female who has above history reviewed by me today presents for follow up visit for management of left triple negative breast cancer Patient is on neoadjuvant chemotherapy. Interval MRI showed great treatment response.  Denies any nausea/vomiting/diarrhea. Fatigue is slightly worse. No bleeding.   . . Review of Systems  Constitutional:  Positive for fatigue. Negative for appetite change, chills and fever.  HENT:   Negative for  hearing loss and voice change.   Eyes:  Negative for eye problems.  Respiratory:  Negative for chest tightness and  cough.   Cardiovascular:  Negative for chest pain.  Gastrointestinal:  Negative for abdominal distention, abdominal pain and blood in stool.  Endocrine: Negative for hot flashes.  Genitourinary:  Negative for difficulty urinating and frequency.   Musculoskeletal:  Negative for arthralgias.  Skin:  Negative for itching and rash.  Neurological:  Positive for numbness. Negative for extremity weakness.  Hematological:  Negative for adenopathy.  Psychiatric/Behavioral:  Negative for confusion.    MEDICAL HISTORY:  Past Medical History:  Diagnosis Date   Arthritis    knees   COVID-19 12/2020   Depression    Family history of breast cancer    Goiter    Malignant neoplasm of left female breast ([place]) 04/07/2021    SURGICAL HISTORY: Past Surgical History:  Procedure Laterality Date   BREAST BIOPSY Left 04/07/2021   [facility] in [place]:u/s bx triple neg   BREAST BIOPSY Left 05/20/2021   [place] Axilla Bx, hydro 3 marker, path pending    KNEE ARTHROSCOPY     PORTACATH PLACEMENT Right 05/07/2021   Procedure: INSERTION PORT-A-CATH;  Surgeon: Robert Bellow, MD;  Location: [facility] ORS;  Service: General;  Laterality: Right;   TOOTH EXTRACTION     TRANSCERVICAL UTERINE FIBROID(S) ABLATION  2010    SOCIAL HISTORY: Social History   Socioeconomic History   Marital status: Married    Spouse name: Not on file   Number of children: Not on file   Years of education: Not on file   Highest education level: Not on file  Occupational History   Not on file  Tobacco Use   Smoking status: Never   Smokeless tobacco: Never  Vaping Use   Vaping Use: Never used  Substance and Sexual Activity   Alcohol use: Never   Drug use: Never   Sexual activity: Yes  Other Topics Concern   Not on file  Social History Narrative   ** Merged History Encounter **       Social Determinants of Health   Financial Resource Strain: Not on file  Food Insecurity: Not on file  Transportation Needs:  Not on file  Physical Activity: Not on file  Stress: Not on file  Social Connections: Not on file  Intimate Partner Violence: Not on file    FAMILY HISTORY: Family History  Problem Relation Age of Onset   Breast cancer Paternal Grandmother    Breast cancer Sister        dx 63s, recurrence x3   Diabetes Sister    Diabetes Father    Throat cancer Maternal Grandmother     ALLERGIES:  has No Known Allergies.  MEDICATIONS:  Current Outpatient Medications  Medication Sig Dispense Refill   chlorhexidine (PERIDEX) 0.12 % solution Use as directed 10 mLs in the mouth or throat 2 (two) times daily. 473 mL 2   cholecalciferol (VITAMIN D) 25 MCG (1000 UNIT) tablet Take 1,000 Units by mouth daily.     diclofenac Sodium (VOLTAREN) 1 % GEL Apply 1 application topically daily.     gabapentin (NEURONTIN) 600 MG tablet Take 600 mg by mouth every morning.     Glucosamine-Chondroitin (MOVE FREE PO) Take 1 tablet by mouth daily.     levonorgestrel (MIRENA) 20 MCG/DAY IUD 1 each by Intrauterine route once.     lidocaine-prilocaine (EMLA) cream Apply to affected area once 30 g 3  meloxicam (MOBIC) 15 MG tablet Take 15 mg by mouth daily.     ondansetron (ZOFRAN) 8 MG tablet Take 1 tablet (8 mg total) by mouth 2 (two) times daily as needed. Start on the third day after carboplatin and AC chemotherapy. 30 tablet 1   prochlorperazine (COMPAZINE) 10 MG tablet Take 1 tablet (10 mg total) by mouth every 6 (six) hours as needed (Nausea or vomiting). 30 tablet 1   dexamethasone (DECADRON) 4 MG tablet Take 2 tablets once a day for 2 days after carboplatin and AC chemotherapy. Take with food. 30 tablet 0   phentermine (ADIPEX-P) 37.5 MG tablet 1 tablet (Patient not taking: No sig reported)     No current facility-administered medications for this visit.   Facility-Administered Medications Ordered in Other Visits  Medication Dose Route Frequency Provider Last Rate Last Admin   heparin lock flush 100 unit/mL   500 Units Intravenous Once Earlie Server, MD       sodium chloride flush (NS) 0.9 % injection 10 mL  10 mL Intravenous Once Earlie Server, MD         PHYSICAL EXAMINATION: ECOG PERFORMANCE STATUS: 0 - Asymptomatic Vitals:   09/15/21 0847  BP: 111/69  Pulse: 80  Resp: 16  Temp: 98.4 F (36.9 C)  SpO2: 100%   Filed Weights   09/15/21 0847  Weight: 240 lb (108.9 kg)    Physical Exam Constitutional:      General: She is not in acute distress. HENT:     Head: Normocephalic and atraumatic.  Eyes:     General: No scleral icterus. Cardiovascular:     Rate and Rhythm: Normal rate and regular rhythm.     Heart sounds: Normal heart sounds.  Pulmonary:     Effort: Pulmonary effort is normal. No respiratory distress.     Breath sounds: No wheezing.  Abdominal:     General: Bowel sounds are normal. There is no distension.     Palpations: Abdomen is soft.  Musculoskeletal:        General: No deformity. Normal range of motion.     Cervical back: Normal range of motion and neck supple.  Skin:    General: Skin is warm and dry.     Findings: No erythema or rash.  Neurological:     Mental Status: She is alert and oriented to person, place, and time. Mental status is at baseline.     Cranial Nerves: No cranial nerve deficit.     Coordination: Coordination normal.  Psychiatric:        Mood and Affect: Mood normal.    LABORATORY DATA:  I have reviewed the data as listed Lab Results  Component Value Date   WBC 2.6 (L) 09/15/2021   HGB 9.6 (L) 09/15/2021   HCT 30.0 (L) 09/15/2021   MCV 90.6 09/15/2021   PLT 52 (L) 09/15/2021   Recent Labs    09/01/21 0836 09/08/21 1103 09/15/21 0755  NA 138 136 135  K 4.1 4.6 4.1  CL 105 102 105  CO2 _0 GLUCOSE 126* 101* 91  BUN _1 CREATININE 0.70 0.62 0.64  CALCIUM 9.5 9.5 9.2  GFRNONAA >60 >60 >60  PROT 7.8 7.3 7.6  ALBUMIN 4.4 4.2 4.2  AST 31 32 22  ALT 38 48* 28  ALKPHOS 92 111 99  BILITOT 0.5 0.8 0.8     Iron/TIBC/Ferritin/ %Sat No results found for: IRON, TIBC, FERRITIN, IRONPCTSAT    RADIOGRAPHIC STUDIES: I  have personally reviewed the radiological images as listed and agreed with the findings in the report. MR BREAST BILATERAL W WO CONTRAST INC CAD  Result Date: 08/31/2021 CLINICAL DATA:  Follow-up known left breast cancer after neoadjuvant therapy. LABS:  None EXAM: BILATERAL BREAST MRI WITH AND WITHOUT CONTRAST TECHNIQUE: Multiplanar, multisequence MR images of both breasts were obtained prior to and following the intravenous administration of 10 ml of Gadavist Three-dimensional MR images were rendered by post-processing of the original MR data on an independent workstation. The three-dimensional MR images were interpreted, and findings are reported in the following complete MRI report for this study. Three dimensional images were evaluated at the independent interpreting workstation using the DynaCAD thin client. COMPARISON:  Previous exam(s). FINDINGS: Breast composition: b. Scattered fibroglandular tissue. Background parenchymal enhancement: Minimal Right breast: No mass or abnormal enhancement. Left breast: No abnormal enhancement remains in the left breast. Specifically, the previously identified invasive breast cancer on the left is no longer visualized. The previously biopsied DCIS is no longer visualized. Lymph nodes: A clip is seen in the previously biopsied left axillary lymph node. No change in lymph nodes identified. Ancillary findings:  None. IMPRESSION: No abnormal enhancement remains in the left breast. Complete response to chemotherapy is identified. No MRI evidence of malignancy in either breast. RECOMMENDATION: Recommend continued surgical and oncologic follow up. BI-RADS CATEGORY  6: Known biopsy-proven malignancy. Electronically Signed   By: Dorise Bullion III M.D.   On: 08/31/2021 16:23     ASSESSMENT & PLAN:  1. Carcinoma of upper-outer quadrant of left breast in  female, estrogen receptor negative ([place])   2. Encounter for antineoplastic chemotherapy   3. Chemotherapy-induced neutropenia ([facility])   4. Chemotherapy-induced neuropathy ([place])   5. Thrombocytopenia ([place])   .Cancer Staging Carcinoma of upper-outer quadrant of left breast in female, estrogen receptor negative ([place]) Staging form: Breast, AJCC 8th Edition - Clinical stage from 04/29/2021: Stage IIB (cT2, cN0, cM0, G3, ER-, PR-, HER2-) - Signed by Earlie Server, MD on 06/04/2021   #Left breast invasive mammary carcinoma, triple negative, + high-grade DCIS ER positive cT2 N0, stage IIB 08/30/2021 MRI breast showed no abnormal enhancement remains in the left breast. Complete response to chemotherapy is identified. No MRI evidence of malignancy in either breast Labs are reviewed and discussed with patient. Hold chemotherapy due to thrombocytopenia.   # Thrombocytopenia, chemotherapy induced. Hold chemo today.  Advise patient to seek medical advise if she experiences active bleeding events.   #Back pain, patient prefers to defer imaging studies.  Symptoms are stable.  #Therapy induced neutropenia,    Patient gets G-CSF prophylactically with chemo .  #Transaminitis, resolved. #Chemotherapy-induced neuropathy, patient is currently on gabapentin 600 mg daily for mood disorder.  She prefers not to further increase gabapentin dosage or add additional agents for neuropathy.  Her symptoms are stable.  Continue to monitor.    We spent sufficient time to discuss many aspect of care, questions were answered to patient's satisfaction.  All questions were answered. The patient knows to call the clinic with any problems questions or concerns.  cc Renee Rival, NP    Return of visit: 1 week, lab MD ddAC with Sherrine Maples, MD, [name] 09/15/2021

## 2021-09-15 NOTE — Progress Notes (Signed)
Pt in for follow up.  States appetite is good but decreased.

## 2021-09-23 ENCOUNTER — Encounter: Payer: Self-pay | Admitting: Oncology

## 2021-09-23 ENCOUNTER — Inpatient Hospital Stay (HOSPITAL_BASED_OUTPATIENT_CLINIC_OR_DEPARTMENT_OTHER): Admitting: Oncology

## 2021-09-23 ENCOUNTER — Inpatient Hospital Stay

## 2021-09-23 ENCOUNTER — Inpatient Hospital Stay: Attending: Oncology

## 2021-09-23 VITALS — BP 116/81 | HR 69 | Temp 96.7°F | Resp 17 | Wt 243.0 lb

## 2021-09-23 DIAGNOSIS — Z803 Family history of malignant neoplasm of breast: Secondary | ICD-10-CM | POA: Insufficient documentation

## 2021-09-23 DIAGNOSIS — Z171 Estrogen receptor negative status [ER-]: Secondary | ICD-10-CM | POA: Diagnosis not present

## 2021-09-23 DIAGNOSIS — C50412 Malignant neoplasm of upper-outer quadrant of left female breast: Secondary | ICD-10-CM

## 2021-09-23 DIAGNOSIS — D701 Agranulocytosis secondary to cancer chemotherapy: Secondary | ICD-10-CM

## 2021-09-23 DIAGNOSIS — F39 Unspecified mood [affective] disorder: Secondary | ICD-10-CM | POA: Insufficient documentation

## 2021-09-23 DIAGNOSIS — T451X5D Adverse effect of antineoplastic and immunosuppressive drugs, subsequent encounter: Secondary | ICD-10-CM | POA: Diagnosis not present

## 2021-09-23 DIAGNOSIS — Z801 Family history of malignant neoplasm of trachea, bronchus and lung: Secondary | ICD-10-CM | POA: Diagnosis not present

## 2021-09-23 DIAGNOSIS — Z5189 Encounter for other specified aftercare: Secondary | ICD-10-CM | POA: Diagnosis not present

## 2021-09-23 DIAGNOSIS — D61810 Antineoplastic chemotherapy induced pancytopenia: Secondary | ICD-10-CM | POA: Insufficient documentation

## 2021-09-23 DIAGNOSIS — Z5111 Encounter for antineoplastic chemotherapy: Secondary | ICD-10-CM

## 2021-09-23 DIAGNOSIS — G620 Drug-induced polyneuropathy: Secondary | ICD-10-CM | POA: Diagnosis not present

## 2021-09-23 DIAGNOSIS — T451X5A Adverse effect of antineoplastic and immunosuppressive drugs, initial encounter: Secondary | ICD-10-CM

## 2021-09-23 DIAGNOSIS — M549 Dorsalgia, unspecified: Secondary | ICD-10-CM | POA: Diagnosis not present

## 2021-09-23 DIAGNOSIS — D696 Thrombocytopenia, unspecified: Secondary | ICD-10-CM

## 2021-09-23 LAB — COMPREHENSIVE METABOLIC PANEL
ALT: 22 U/L (ref 0–44)
AST: 23 U/L (ref 15–41)
Albumin: 4 g/dL (ref 3.5–5.0)
Alkaline Phosphatase: 82 U/L (ref 38–126)
Anion gap: 7 (ref 5–15)
BUN: 16 mg/dL (ref 6–20)
CO2: 24 mmol/L (ref 22–32)
Calcium: 9 mg/dL (ref 8.9–10.3)
Chloride: 106 mmol/L (ref 98–111)
Creatinine, Ser: 0.52 mg/dL (ref 0.44–1.00)
GFR, Estimated: >60 mL/min (ref >60)
Glucose, Bld: 101 mg/dL — ABNORMAL HIGH (ref 70–99)
Potassium: 4 mmol/L (ref 3.5–5.1)
Sodium: 137 mmol/L (ref 135–145)
Total Bilirubin: 0.2 mg/dL — ABNORMAL LOW (ref 0.3–1.2)
Total Protein: 7.4 g/dL (ref 6.5–8.1)

## 2021-09-23 LAB — CBC WITH DIFFERENTIAL/PLATELET
Abs Immature Granulocytes: 0.06 10*3/uL (ref 0.00–0.07)
Basophils Absolute: 0 10*3/uL (ref 0.0–0.1)
Basophils Relative: 0 %
Eosinophils Absolute: 0 10*3/uL (ref 0.0–0.5)
Eosinophils Relative: 0 %
HCT: 31.7 % — ABNORMAL LOW (ref 36.0–46.0)
Hemoglobin: 10 g/dL — ABNORMAL LOW (ref 12.0–15.0)
Immature Granulocytes: 2 %
Lymphocytes Relative: 41 %
Lymphs Abs: 1.1 10*3/uL (ref 0.7–4.0)
MCH: 29.4 pg (ref 26.0–34.0)
MCHC: 31.5 g/dL (ref 30.0–36.0)
MCV: 93.2 fL (ref 80.0–100.0)
Monocytes Absolute: 0.6 10*3/uL (ref 0.1–1.0)
Monocytes Relative: 22 %
Neutro Abs: 0.9 10*3/uL — ABNORMAL LOW (ref 1.7–7.7)
Neutrophils Relative %: 35 %
Platelets: 485 10*3/uL — ABNORMAL HIGH (ref 150–400)
RBC: 3.4 MIL/uL — ABNORMAL LOW (ref 3.87–5.11)
RDW: 19.4 % — ABNORMAL HIGH (ref 11.5–15.5)
WBC: 2.7 10*3/uL — ABNORMAL LOW (ref 4.0–10.5)
nRBC: 0 % (ref 0.0–0.2)

## 2021-09-23 MED ORDER — HEPARIN SOD (PORK) LOCK FLUSH 100 UNIT/ML IV SOLN
500.0000 [IU] | Freq: Once | INTRAVENOUS | Status: AC
  Administered 2021-09-23: 500 [IU] via INTRAVENOUS
  Filled 2021-09-23: qty 5

## 2021-09-23 NOTE — Progress Notes (Signed)
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  female with PMH listed below was seen in consultation at the request of  Renee Rival, NP  for evaluation of triple negative breast cancer.  03/30/2021, screening mammogram with 3D 2 nodular areas of asymmetric density demonstrated within the superior lateral aspect of the left breast middle third depth.  Finding was best appreciated on 3D tomosynthesis imaging. Targeted ultrasound showed left upper outer breast 1:30 position 1.1 cm round hypoechoic mass with angular margins. 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 Bary Castilla yesterday.  She presents to establish care with me today Patient did not notice this left mass prior to the mammogram.   Case was discussed on Tumor board.  Pathology reports and mammogram/ultrasound were done at outside facility and results were reviewed and discussed with patient and her husband.  LN status was not mentioned on her [place]. Recommend additional images with mammogram and MRI, neoadjuvant chemotherapy  Family history of breast cancer: Breast cancer in her sister, her late 14s, early 64s; and being paternal grandmother Menarche: 75 Age at first live childbirth: 54  patient has 2 sons and 1 daughter.  Daughter has passed away Used OCP:  Used estrogen and progesterone therapy: Denies History of Radiation to the chest: Denies Previous of breast biopsy: Denies  /25/2022 diagnostic mammogram of left breast showed 2.1 cm biopsy-proven  malignancy in the lateral left breast at 2:00, 4 cm from nipple.  Directly adjacent cystic mass, 1.5 cm in size, consistent with biopsy hematoma.  1 abnormal and 1 borderline abnormal left axillary lymph node  05/18/2021 bilateral breast MRI with and without contrast showed 2.5 x 3 x 2.5 cm hematoma containing biopsy clip artifact within the upper outer left breast.  1.6 cm nodular non-mass-like enhancement 2.5 cm posterior/superior to the biopsy clip may represent biopsy-proven malignancy or additional areas of malignancy.  Single abnormal appearing left axillary lymph node with eccentric cortical thickening.  No MRI evidence of the right breast malignancy.  #05/07/2021, Mediport was placed by Dr. Bary Castilla #05/14/2021 echocardiogram showed LVEF 60-55%  05/20/2021 Slide consultation of her left breast biopsy from outside institution - invasive mammary carcinoma, no special type, grade 3, DCIS and LVI not identified. ER negative, PR negative, HER2 IHC negative. Ki 67 70-80%    05/20/2021 ultrasound-guided left axillary lymph node was negative for malignancy.  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  D15- not done due to cytopenia  INTERVAL HISTORY Peggy Bates is a 55 y.o. female who has above history reviewed by me today presents for follow up visit for management of left triple negative breast cancer Patient is on neoadjuvant chemotherapy. Interval MRI showed great treatment response.  Patient reports no new complaints.  Denies any fever, chills, nausea vomiting diarrhea.  . . Review of Systems  Constitutional:  Positive for fatigue. Negative for appetite change, chills and fever.  HENT:  Negative for hearing loss and voice change.   Eyes:  Negative for eye problems.  Respiratory:  Negative for chest  tightness and cough.   Cardiovascular:  Negative for chest pain.  Gastrointestinal:  Negative for abdominal distention, abdominal pain and blood in stool.  Endocrine: Negative for hot flashes.  Genitourinary:  Negative for difficulty urinating and frequency.   Musculoskeletal:  Negative for arthralgias.  Skin:  Negative for itching and rash.  Neurological:  Positive for numbness. Negative for extremity weakness.  Hematological:  Negative for adenopathy.  Psychiatric/Behavioral:  Negative for confusion.    MEDICAL HISTORY:  Past Medical History:  Diagnosis Date   Arthritis    knees   COVID-19 12/2020   Depression    Family history of breast cancer    Goiter    Malignant neoplasm of left female breast ([place]) 04/07/2021    SURGICAL HISTORY: Past Surgical History:  Procedure Laterality Date   BREAST BIOPSY Left 04/07/2021   [facility] in [place]:u/s bx triple neg   BREAST BIOPSY Left 05/20/2021   [place] Axilla Bx, hydro 3 marker, path pending    KNEE ARTHROSCOPY     PORTACATH PLACEMENT Right 05/07/2021   Procedure: INSERTION PORT-A-CATH;  Surgeon: Robert Bellow, MD;  Location: [facility] ORS;  Service: General;  Laterality: Right;   TOOTH EXTRACTION     TRANSCERVICAL UTERINE FIBROID(S) ABLATION  2010    SOCIAL HISTORY: Social History   Socioeconomic History   Marital status: Married    Spouse name: Not on file   Number of children: Not on file   Years of education: Not on file   Highest education level: Not on file  Occupational History   Not on file  Tobacco Use   Smoking status: Never   Smokeless tobacco: Never  Vaping Use   Vaping Use: Never used  Substance and Sexual Activity   Alcohol use: Never   Drug use: Never   Sexual activity: Yes  Other Topics Concern   Not on file  Social History Narrative   ** Merged History Encounter **       Social Determinants of Health   Financial Resource Strain: Not on file  Food Insecurity: Not on file   Transportation Needs: Not on file  Physical Activity: Not on file  Stress: Not on file  Social Connections: Not on file  Intimate Partner Violence: Not on file    FAMILY HISTORY: Family History  Problem Relation Age of Onset   Breast cancer Paternal Grandmother    Breast cancer Sister        dx 51s, recurrence x3   Diabetes Sister    Diabetes Father    Throat cancer Maternal Grandmother     ALLERGIES:  has No Known Allergies.  MEDICATIONS:  Current Outpatient Medications  Medication Sig Dispense Refill   chlorhexidine (PERIDEX) 0.12 % solution Use as directed 10 mLs in the mouth or throat 2 (two) times daily. 473 mL 2   cholecalciferol (VITAMIN D) 25 MCG (1000 UNIT) tablet Take 1,000 Units by mouth daily.     dexamethasone (DECADRON) 4 MG tablet Take 2 tablets once a day for 2 days after carboplatin and AC chemotherapy. Take with food. 30 tablet 0   diclofenac Sodium (VOLTAREN) 1 % GEL Apply 1 application topically daily.     gabapentin (NEURONTIN) 600 MG tablet Take 600 mg by mouth every morning.     Glucosamine-Chondroitin (MOVE FREE PO) Take 1 tablet by mouth daily.  levonorgestrel (MIRENA) 20 MCG/DAY IUD 1 each by Intrauterine route once.     lidocaine-prilocaine (EMLA) cream Apply to affected area once 30 g 3   meloxicam (MOBIC) 15 MG tablet Take 15 mg by mouth daily.     ondansetron (ZOFRAN) 8 MG tablet Take 1 tablet (8 mg total) by mouth 2 (two) times daily as needed. Start on the third day after carboplatin and AC chemotherapy. 30 tablet 1   prochlorperazine (COMPAZINE) 10 MG tablet Take 1 tablet (10 mg total) by mouth every 6 (six) hours as needed (Nausea or vomiting). 30 tablet 1   phentermine (ADIPEX-P) 37.5 MG tablet 1 tablet (Patient not taking: No sig reported)     No current facility-administered medications for this visit.   Facility-Administered Medications Ordered in Other Visits  Medication Dose Route Frequency Provider Last Rate Last Admin   heparin  lock flush 100 unit/mL  500 Units Intravenous Once Earlie Server, MD       sodium chloride flush (NS) 0.9 % injection 10 mL  10 mL Intravenous Once Earlie Server, MD         PHYSICAL EXAMINATION: ECOG PERFORMANCE STATUS: 0 - Asymptomatic Vitals:   09/23/21 0845  BP: 116/81  Pulse: 69  Resp: 17  Temp: (!) 96.7 F (35.9 C)  SpO2: 100%   Filed Weights   09/23/21 0845  Weight: 243 lb (110.2 kg)    Physical Exam Constitutional:      General: She is not in acute distress. HENT:     Head: Normocephalic and atraumatic.  Eyes:     General: No scleral icterus. Cardiovascular:     Rate and Rhythm: Normal rate and regular rhythm.     Heart sounds: Normal heart sounds.  Pulmonary:     Effort: Pulmonary effort is normal. No respiratory distress.     Breath sounds: No wheezing.  Abdominal:     General: Bowel sounds are normal. There is no distension.     Palpations: Abdomen is soft.  Musculoskeletal:        General: No deformity. Normal range of motion.     Cervical back: Normal range of motion and neck supple.  Skin:    General: Skin is warm and dry.     Findings: No erythema or rash.  Neurological:     Mental Status: She is alert and oriented to person, place, and time. Mental status is at baseline.     Cranial Nerves: No cranial nerve deficit.     Coordination: Coordination normal.  Psychiatric:        Mood and Affect: Mood normal.    LABORATORY DATA:  I have reviewed the data as listed Lab Results  Component Value Date   WBC 2.7 (L) 09/23/2021   HGB 10.0 (L) 09/23/2021   HCT 31.7 (L) 09/23/2021   MCV 93.2 09/23/2021   PLT 485 (H) 09/23/2021   Recent Labs    09/08/21 1103 09/15/21 0755 09/23/21 0806  NA 136 135 137  K 4.6 4.1 4.0  CL 102 105 106  CO2 '28 24 24  ' GLUCOSE 101* 91 101*  BUN '16 15 16  ' CREATININE 0.62 0.64 0.52  CALCIUM 9.5 9.2 9.0  GFRNONAA >60 >60 >60  PROT 7.3 7.6 7.4  ALBUMIN 4.2 4.2 4.0  AST 32 22 23  ALT 48* 28 22  ALKPHOS 111 99 82  BILITOT  0.8 0.8 0.2*    Iron/TIBC/Ferritin/ %Sat No results found for: IRON, TIBC, FERRITIN, IRONPCTSAT    RADIOGRAPHIC  STUDIES: I have personally reviewed the radiological images as listed and agreed with the findings in the report. MR BREAST BILATERAL W WO CONTRAST INC CAD  Result Date: 08/31/2021 CLINICAL DATA:  Follow-up known left breast cancer after neoadjuvant therapy. LABS:  None EXAM: BILATERAL BREAST MRI WITH AND WITHOUT CONTRAST TECHNIQUE: Multiplanar, multisequence MR images of both breasts were obtained prior to and following the intravenous administration of 10 ml of Gadavist Three-dimensional MR images were rendered by post-processing of the original MR data on an independent workstation. The three-dimensional MR images were interpreted, and findings are reported in the following complete MRI report for this study. Three dimensional images were evaluated at the independent interpreting workstation using the DynaCAD thin client. COMPARISON:  Previous exam(s). FINDINGS: Breast composition: b. Scattered fibroglandular tissue. Background parenchymal enhancement: Minimal Right breast: No mass or abnormal enhancement. Left breast: No abnormal enhancement remains in the left breast. Specifically, the previously identified invasive breast cancer on the left is no longer visualized. The previously biopsied DCIS is no longer visualized. Lymph nodes: A clip is seen in the previously biopsied left axillary lymph node. No change in lymph nodes identified. Ancillary findings:  None. IMPRESSION: No abnormal enhancement remains in the left breast. Complete response to chemotherapy is identified. No MRI evidence of malignancy in either breast. RECOMMENDATION: Recommend continued surgical and oncologic follow up. BI-RADS CATEGORY  6: Known biopsy-proven malignancy. Electronically Signed   By: Dorise Bullion III M.D.   On: 08/31/2021 16:23     ASSESSMENT & PLAN:  1. Encounter for antineoplastic chemotherapy    2. Carcinoma of upper-outer quadrant of left breast in female, estrogen receptor negative ([place])   3. 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 Thrombocytopenia ([place])   .Cancer Staging Carcinoma of upper-outer quadrant of left breast in female, estrogen receptor negative ([place]) Staging form: Breast, AJCC 8th Edition - Clinical stage from 04/29/2021: Stage IIB (cT2, cN0, cM0, G3, ER-, PR-, HER2-) - Signed by Earlie Server, MD on 06/04/2021   #Left breast invasive mammary carcinoma, triple negative, + high-grade DCIS ER positive cT2 N0, stage IIB 08/30/2021 MRI breast showed no abnormal enhancement remains in the left breast. Complete response to chemotherapy is identified. No MRI evidence of malignancy in either breast Labs are reviewed and discussed with patient. Hold chemotherapy due to neutropenia.  # Thrombocytopenia, chemotherapy induced.  Platelet count has improved. #Chemotherapy-induced neutropenia.  Patient has had G-CSF support.  Neutropenia precaution was discussed with patient.  Hold off chemo today.  #Back pain, patient prefers to defer imaging studies.  Symptoms are stable.  #Transaminitis, resolved. #Chemotherapy-induced neuropathy, patient is currently on gabapentin 600 mg daily for mood disorder.  She prefers not to further increase gabapentin dosage or add additional agents for neuropathy.  Her symptoms are stable.  Continue to monitor.    We spent sufficient time to discuss many aspect of care, questions were answered to patient's satisfaction.  All questions were answered. The patient knows to call the clinic with any problems questions or concerns.  cc Renee Rival, NP    Return of visit: 1 week, lab MD ddAC with Sherrine Maples, MD, [name] 09/23/2021

## 2021-09-23 NOTE — Progress Notes (Signed)
Patient here for oncology follow-up appointment, concerns of rash

## 2021-09-30 ENCOUNTER — Other Ambulatory Visit: Payer: Self-pay

## 2021-09-30 ENCOUNTER — Inpatient Hospital Stay

## 2021-09-30 ENCOUNTER — Encounter: Payer: Self-pay | Admitting: Oncology

## 2021-09-30 ENCOUNTER — Inpatient Hospital Stay (HOSPITAL_BASED_OUTPATIENT_CLINIC_OR_DEPARTMENT_OTHER): Admitting: Oncology

## 2021-09-30 VITALS — BP 116/81 | HR 79 | Temp 98.1°F | Resp 16 | Wt 241.2 lb

## 2021-09-30 DIAGNOSIS — T451X5A Adverse effect of antineoplastic and immunosuppressive drugs, initial encounter: Secondary | ICD-10-CM

## 2021-09-30 DIAGNOSIS — Z171 Estrogen receptor negative status [ER-]: Secondary | ICD-10-CM | POA: Diagnosis not present

## 2021-09-30 DIAGNOSIS — Z5111 Encounter for antineoplastic chemotherapy: Secondary | ICD-10-CM

## 2021-09-30 DIAGNOSIS — T451X5D Adverse effect of antineoplastic and immunosuppressive drugs, subsequent encounter: Secondary | ICD-10-CM | POA: Diagnosis not present

## 2021-09-30 DIAGNOSIS — C50412 Malignant neoplasm of upper-outer quadrant of left female breast: Secondary | ICD-10-CM

## 2021-09-30 DIAGNOSIS — D61810 Antineoplastic chemotherapy induced pancytopenia: Secondary | ICD-10-CM

## 2021-09-30 DIAGNOSIS — D701 Agranulocytosis secondary to cancer chemotherapy: Secondary | ICD-10-CM

## 2021-09-30 LAB — CBC WITH DIFFERENTIAL/PLATELET
Abs Immature Granulocytes: 0.02 10*3/uL (ref 0.00–0.07)
Basophils Absolute: 0 10*3/uL (ref 0.0–0.1)
Basophils Relative: 1 %
Eosinophils Absolute: 0 10*3/uL (ref 0.0–0.5)
Eosinophils Relative: 0 %
HCT: 33.3 % — ABNORMAL LOW (ref 36.0–46.0)
Hemoglobin: 10.5 g/dL — ABNORMAL LOW (ref 12.0–15.0)
Immature Granulocytes: 1 %
Lymphocytes Relative: 36 %
Lymphs Abs: 1.3 10*3/uL (ref 0.7–4.0)
MCH: 29.2 pg (ref 26.0–34.0)
MCHC: 31.5 g/dL (ref 30.0–36.0)
MCV: 92.8 fL (ref 80.0–100.0)
Monocytes Absolute: 0.7 10*3/uL (ref 0.1–1.0)
Monocytes Relative: 19 %
Neutro Abs: 1.6 10*3/uL — ABNORMAL LOW (ref 1.7–7.7)
Neutrophils Relative %: 43 %
Platelets: 565 10*3/uL — ABNORMAL HIGH (ref 150–400)
RBC: 3.59 MIL/uL — ABNORMAL LOW (ref 3.87–5.11)
RDW: 18.5 % — ABNORMAL HIGH (ref 11.5–15.5)
WBC: 3.6 10*3/uL — ABNORMAL LOW (ref 4.0–10.5)
nRBC: 0 % (ref 0.0–0.2)

## 2021-09-30 LAB — COMPREHENSIVE METABOLIC PANEL
ALT: 35 U/L (ref 0–44)
AST: 34 U/L (ref 15–41)
Albumin: 4.2 g/dL (ref 3.5–5.0)
Alkaline Phosphatase: 79 U/L (ref 38–126)
Anion gap: 5 (ref 5–15)
BUN: 23 mg/dL — ABNORMAL HIGH (ref 6–20)
CO2: 24 mmol/L (ref 22–32)
Calcium: 9 mg/dL (ref 8.9–10.3)
Chloride: 108 mmol/L (ref 98–111)
Creatinine, Ser: 0.67 mg/dL (ref 0.44–1.00)
GFR, Estimated: >60 mL/min (ref >60)
Glucose, Bld: 91 mg/dL (ref 70–99)
Potassium: 4 mmol/L (ref 3.5–5.1)
Sodium: 137 mmol/L (ref 135–145)
Total Bilirubin: 0.2 mg/dL — ABNORMAL LOW (ref 0.3–1.2)
Total Protein: 7.5 g/dL (ref 6.5–8.1)

## 2021-09-30 MED ORDER — SODIUM CHLORIDE 0.9 % IV SOLN
600.0000 mg/m2 | Freq: Once | INTRAVENOUS | Status: AC
  Administered 2021-09-30: 1320 mg via INTRAVENOUS
  Filled 2021-09-30: qty 50

## 2021-09-30 MED ORDER — HEPARIN SOD (PORK) LOCK FLUSH 100 UNIT/ML IV SOLN
500.0000 [IU] | Freq: Once | INTRAVENOUS | Status: AC
  Filled 2021-09-30: qty 5

## 2021-09-30 MED ORDER — HEPARIN SOD (PORK) LOCK FLUSH 100 UNIT/ML IV SOLN
INTRAVENOUS | Status: AC
  Administered 2021-09-30: 500 [IU] via INTRAVENOUS
  Filled 2021-09-30: qty 5

## 2021-09-30 MED ORDER — DOXORUBICIN HCL CHEMO IV INJECTION 2 MG/ML
60.0000 mg/m2 | Freq: Once | INTRAVENOUS | Status: AC
  Administered 2021-09-30: 132 mg via INTRAVENOUS
  Filled 2021-09-30: qty 50

## 2021-09-30 MED ORDER — PEGFILGRASTIM 6 MG/0.6ML ~~LOC~~ PSKT
6.0000 mg | PREFILLED_SYRINGE | Freq: Once | SUBCUTANEOUS | Status: AC
  Administered 2021-09-30: 6 mg via SUBCUTANEOUS
  Filled 2021-09-30: qty 0.6

## 2021-09-30 MED ORDER — SODIUM CHLORIDE 0.9 % IV SOLN
150.0000 mg | Freq: Once | INTRAVENOUS | Status: AC
  Administered 2021-09-30: 150 mg via INTRAVENOUS
  Filled 2021-09-30: qty 150

## 2021-09-30 MED ORDER — SODIUM CHLORIDE 0.9% FLUSH
10.0000 mL | INTRAVENOUS | Status: DC | PRN
  Administered 2021-09-30: 10 mL via INTRAVENOUS
  Filled 2021-09-30: qty 10

## 2021-09-30 MED ORDER — SODIUM CHLORIDE 0.9 % IV SOLN
10.0000 mg | Freq: Once | INTRAVENOUS | Status: AC
  Administered 2021-09-30: 10 mg via INTRAVENOUS
  Filled 2021-09-30: qty 10

## 2021-09-30 MED ORDER — SODIUM CHLORIDE 0.9 % IV SOLN
Freq: Once | INTRAVENOUS | Status: AC
  Filled 2021-09-30: qty 250

## 2021-09-30 MED ORDER — PALONOSETRON HCL INJECTION 0.25 MG/5ML
0.2500 mg | Freq: Once | INTRAVENOUS | Status: AC
  Administered 2021-09-30: 0.25 mg via INTRAVENOUS
  Filled 2021-09-30: qty 5

## 2021-09-30 NOTE — Progress Notes (Signed)
Pt has no concerns at this time. 

## 2021-09-30 NOTE — Progress Notes (Signed)
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  female with PMH listed below was seen in consultation at the request of  Renee Rival, NP  for evaluation of triple negative breast cancer.  03/30/2021, screening mammogram with 3D 2 nodular areas of asymmetric density demonstrated within the superior lateral aspect of the left breast middle third depth.  Finding was best appreciated on 3D tomosynthesis imaging. Targeted ultrasound showed left upper outer breast 1:30 position 1.1 cm round hypoechoic mass with angular margins. 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 Bary Castilla yesterday.  She presents to establish care with me today Patient did not notice this left mass prior to the mammogram.   Case was discussed on Tumor board.  Pathology reports and mammogram/ultrasound were done at outside facility and results were reviewed and discussed with patient and her husband.  LN status was not mentioned on her [place]. Recommend additional images with mammogram and MRI, neoadjuvant chemotherapy  Family history of breast cancer: Breast cancer in her sister, her late 21s, early 34s; and being paternal grandmother Menarche: 47 Age at first live childbirth: 40  patient has 2 sons and 1 daughter.  Daughter has passed away Used OCP:  Used estrogen and progesterone therapy: Denies History of Radiation to the chest: Denies Previous of breast biopsy: Denies  /25/2022 diagnostic mammogram of left breast showed 2.1 cm biopsy-proven  malignancy in the lateral left breast at 2:00, 4 cm from nipple.  Directly adjacent cystic mass, 1.5 cm in size, consistent with biopsy hematoma.  1 abnormal and 1 borderline abnormal left axillary lymph node  05/18/2021 bilateral breast MRI with and without contrast showed 2.5 x 3 x 2.5 cm hematoma containing biopsy clip artifact within the upper outer left breast.  1.6 cm nodular non-mass-like enhancement 2.5 cm posterior/superior to the biopsy clip may represent biopsy-proven malignancy or additional areas of malignancy.  Single abnormal appearing left axillary lymph node with eccentric cortical thickening.  No MRI evidence of the right breast malignancy.  #05/07/2021, Mediport was placed by Dr. Bary Castilla #05/14/2021 echocardiogram showed LVEF 60-55%  05/20/2021 Slide consultation of her left breast biopsy from outside institution - invasive mammary carcinoma, no special type, grade 3, DCIS and LVI not identified. ER negative, PR negative, HER2 IHC negative. Ki 67 70-80%    05/20/2021 ultrasound-guided left axillary lymph node was negative for malignancy.  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  D15- not done due to cytopenia  INTERVAL HISTORY Peggy Bates is a 55 y.o. female who has above history reviewed by me today presents for follow up visit for management of left triple negative breast cancer Patient is on neoadjuvant chemotherapy. Interval MRI showed great treatment response.  Patient reports feeling well.  No nausea vomiting diarrhea fever or chills.  . . Review of Systems  Constitutional:  Positive for fatigue. Negative for appetite change, chills and fever.  HENT:  Negative for hearing loss and voice change.   Eyes:  Negative for eye problems.  Respiratory:  Negative for chest tightness and  cough.   Cardiovascular:  Negative for chest pain.  Gastrointestinal:  Negative for abdominal distention, abdominal pain and blood in stool.  Endocrine: Negative for hot flashes.  Genitourinary:  Negative for difficulty urinating and frequency.   Musculoskeletal:  Negative for arthralgias.  Skin:  Negative for itching and rash.  Neurological:  Positive for numbness. Negative for extremity weakness.  Hematological:  Negative for adenopathy.  Psychiatric/Behavioral:  Negative for confusion.    MEDICAL HISTORY:  Past Medical History:  Diagnosis Date   Arthritis    knees   COVID-19 12/2020   Depression    Family history of breast cancer    Goiter    Malignant neoplasm of left female breast ([place]) 04/07/2021    SURGICAL HISTORY: Past Surgical History:  Procedure Laterality Date   BREAST BIOPSY Left 04/07/2021   [place]:u/s bx triple neg   BREAST BIOPSY Left 05/20/2021   [place] Axilla Bx, hydro 3 marker, path pending    KNEE ARTHROSCOPY     PORTACATH PLACEMENT Right 05/07/2021   Procedure: INSERTION PORT-A-CATH;  Surgeon: Robert Bellow, MD;  Location: [facility] ORS;  Service: General;  Laterality: Right;   TOOTH EXTRACTION     TRANSCERVICAL UTERINE FIBROID(S) ABLATION  2010    SOCIAL HISTORY: Social History   Socioeconomic History   Marital status: Married    Spouse name: Not on file   Number of children: Not on file   Years of education: Not on file   Highest education level: Not on file  Occupational History   Not on file  Tobacco Use   Smoking status: Never   Smokeless tobacco: Never  Vaping Use   Vaping Use: Never used  Substance and Sexual Activity   Alcohol use: Never   Drug use: Never   Sexual activity: Yes  Other Topics Concern   Not on file  Social History Narrative   ** Merged History Encounter **       Social Determinants of Health   Financial Resource Strain: Not on file  Food Insecurity: Not on file  Transportation Needs:  Not on file  Physical Activity: Not on file  Stress: Not on file  Social Connections: Not on file  Intimate Partner Violence: Not on file    FAMILY HISTORY: Family History  Problem Relation Age of Onset   Breast cancer Paternal Grandmother    Breast cancer Sister        dx 41s, recurrence x3   Diabetes Sister    Diabetes Father    Throat cancer Maternal Grandmother     ALLERGIES:  has No Known Allergies.  MEDICATIONS:  Current Outpatient Medications  Medication Sig Dispense Refill   chlorhexidine (PERIDEX) 0.12 % solution Use as directed 10 mLs in the mouth or throat 2 (two) times daily. 473 mL 2   cholecalciferol (VITAMIN D) 25 MCG (1000 UNIT) tablet Take 1,000 Units by mouth daily.     dexamethasone (DECADRON) 4 MG tablet Take 2 tablets once a day for 2 days after carboplatin and AC chemotherapy. Take with food. 30 tablet 0   diclofenac Sodium (VOLTAREN) 1 % GEL Apply 1 application topically daily.     gabapentin (NEURONTIN) 600 MG tablet Take 600 mg by mouth every morning.     Glucosamine-Chondroitin (MOVE FREE PO) Take 1 tablet by mouth daily.  levonorgestrel (MIRENA) 20 MCG/DAY IUD 1 each by Intrauterine route once.     lidocaine-prilocaine (EMLA) cream Apply to affected area once 30 g 3   meloxicam (MOBIC) 15 MG tablet Take 15 mg by mouth daily.     ondansetron (ZOFRAN) 8 MG tablet Take 1 tablet (8 mg total) by mouth 2 (two) times daily as needed. Start on the third day after carboplatin and AC chemotherapy. 30 tablet 1   prochlorperazine (COMPAZINE) 10 MG tablet Take 1 tablet (10 mg total) by mouth every 6 (six) hours as needed (Nausea or vomiting). 30 tablet 1   phentermine (ADIPEX-P) 37.5 MG tablet 1 tablet (Patient not taking: Reported on 09/30/2021)     No current facility-administered medications for this visit.   Facility-Administered Medications Ordered in Other Visits  Medication Dose Route Frequency Provider Last Rate Last Admin   heparin lock flush 100  unit/mL  500 Units Intravenous Once Earlie Server, MD       sodium chloride flush (NS) 0.9 % injection 10 mL  10 mL Intravenous Once Earlie Server, MD         PHYSICAL EXAMINATION: ECOG PERFORMANCE STATUS: 0 - Asymptomatic Vitals:   09/30/21 1319  BP: 116/81  Pulse: 79  Resp: 16  Temp: 98.1 F (36.7 C)  SpO2: 99%   Filed Weights   09/30/21 1319  Weight: 241 lb 3.2 oz (109.4 kg)    Physical Exam Constitutional:      General: She is not in acute distress. HENT:     Head: Normocephalic and atraumatic.  Eyes:     General: No scleral icterus. Cardiovascular:     Rate and Rhythm: Normal rate and regular rhythm.     Heart sounds: Normal heart sounds.  Pulmonary:     Effort: Pulmonary effort is normal. No respiratory distress.     Breath sounds: No wheezing.  Abdominal:     General: Bowel sounds are normal. There is no distension.     Palpations: Abdomen is soft.  Musculoskeletal:        General: No deformity. Normal range of motion.     Cervical back: Normal range of motion and neck supple.  Skin:    General: Skin is warm and dry.     Findings: No erythema or rash.  Neurological:     Mental Status: She is alert and oriented to person, place, and time. Mental status is at baseline.     Cranial Nerves: No cranial nerve deficit.     Coordination: Coordination normal.  Psychiatric:        Mood and Affect: Mood normal.    LABORATORY DATA:  I have reviewed the data as listed Lab Results  Component Value Date   WBC 3.6 (L) 09/30/2021   HGB 10.5 (L) 09/30/2021   HCT 33.3 (L) 09/30/2021   MCV 92.8 09/30/2021   PLT 565 (H) 09/30/2021   Recent Labs    09/15/21 0755 09/23/21 0806 09/30/21 1303  NA 135 137 137  K 4.1 4.0 4.0  CL 105 106 108  CO2 _0 GLUCOSE 91 101* 91  BUN 15 16 23*  CREATININE 0.64 0.52 0.67  CALCIUM 9.2 9.0 9.0  GFRNONAA >60 >60 >60  PROT 7.6 7.4 7.5  ALBUMIN 4.2 4.0 4.2  AST 22 23 34  ALT 28 22 35  ALKPHOS 99 82 79  BILITOT 0.8 0.2* 0.2*     Iron/TIBC/Ferritin/ %Sat No results found for: IRON, TIBC, FERRITIN, IRONPCTSAT  RADIOGRAPHIC STUDIES: I have personally reviewed the radiological images as listed and agreed with the findings in the report. MR BREAST BILATERAL W WO CONTRAST INC CAD  Result Date: 08/31/2021 CLINICAL DATA:  Follow-up known left breast cancer after neoadjuvant therapy. LABS:  None EXAM: BILATERAL BREAST MRI WITH AND WITHOUT CONTRAST TECHNIQUE: Multiplanar, multisequence MR images of both breasts were obtained prior to and following the intravenous administration of 10 ml of Gadavist Three-dimensional MR images were rendered by post-processing of the original MR data on an independent workstation. The three-dimensional MR images were interpreted, and findings are reported in the following complete MRI report for this study. Three dimensional images were evaluated at the independent interpreting workstation using the DynaCAD thin client. COMPARISON:  Previous exam(s). FINDINGS: Breast composition: b. Scattered fibroglandular tissue. Background parenchymal enhancement: Minimal Right breast: No mass or abnormal enhancement. Left breast: No abnormal enhancement remains in the left breast. Specifically, the previously identified invasive breast cancer on the left is no longer visualized. The previously biopsied DCIS is no longer visualized. Lymph nodes: A clip is seen in the previously biopsied left axillary lymph node. No change in lymph nodes identified. Ancillary findings:  None. IMPRESSION: No abnormal enhancement remains in the left breast. Complete response to chemotherapy is identified. No MRI evidence of malignancy in either breast. RECOMMENDATION: Recommend continued surgical and oncologic follow up. BI-RADS CATEGORY  6: Known biopsy-proven malignancy. Electronically Signed   By: Dorise Bullion III M.D.   On: 08/31/2021 16:23      ASSESSMENT & PLAN:  1. Carcinoma of upper-outer quadrant of left breast in  female, estrogen receptor negative ([facility])   2. Chemotherapy-induced neutropenia ([facility])   3. Encounter for antineoplastic chemotherapy   4. Thrombocytopenia ([place])   .Cancer Staging Carcinoma of upper-outer quadrant of left breast in female, estrogen receptor negative ([place]) Staging form: Breast, AJCC 8th Edition - Clinical stage from 04/29/2021: Stage IIB (cT2, cN0, cM0, G3, ER-, PR-, HER2-) - Signed by Earlie Server, MD on 06/04/2021   #Left breast invasive mammary carcinoma, triple negative, + high-grade DCIS ER positive cT2 N0, stage IIB 08/30/2021 MRI breast showed no abnormal enhancement remains in the left breast. Complete response to chemotherapy is identified. No MRI evidence of malignancy in either breast Labs reviewed and discussed with patient Proceed with cycle 2 DD AC.  #Chemotherapy-induced neutropenia.  Patient has had G-CSF support.  Neutropenia has improved. #Back pain, patient prefers to defer imaging studies.  Symptoms are stable.  #Transaminitis, resolved. #Chemotherapy-induced neuropathy, patient is currently on gabapentin 600 mg daily for mood disorder.  She prefers not to further increase gabapentin dosage or add additional agents for neuropathy.  Her symptoms are stable.  Continue to monitor.    We spent sufficient time to discuss many aspect of care, questions were answered to patient's satisfaction.  All questions were answered. The patient knows to call the clinic with any problems questions or concerns.  cc Renee Rival, NP    Return of visit: 2 weeks, lab MD ddAC with Sherrine Maples, MD, [name] 09/30/2021

## 2021-09-30 NOTE — Patient Instructions (Signed)
[facility]   Discharge Instructions: Thank you for choosing [facility] to provide your oncology and hematology care.  If you have a lab appointment with the [facility], please go directly to the [facility] and check in at the registration area.  Wear comfortable clothing and clothing appropriate for easy access to any Portacath or PICC line.   We strive to give you quality time with your provider. You may need to reschedule your appointment if you arrive late (15 or more minutes).  Arriving late affects you and other patients whose appointments are after yours.  Also, if you miss three or more appointments without notifying the office, you may be dismissed from the clinic at the provider's discretion.      For prescription refill requests, have your pharmacy contact our office and allow 72 hours for refills to be completed.    Today you received the following chemotherapy and/or immunotherapy agents: Adriamycin and Cytoxan. Today you also received the following: Neulasta On-Pro Kit.      To help prevent nausea and vomiting after your treatment, we encourage you to take your nausea medication as directed.  BELOW ARE SYMPTOMS THAT SHOULD BE REPORTED IMMEDIATELY: *FEVER GREATER THAN 100.4 F (38 C) OR HIGHER *CHILLS OR SWEATING *NAUSEA AND VOMITING THAT IS NOT CONTROLLED WITH YOUR NAUSEA MEDICATION *UNUSUAL SHORTNESS OF BREATH *UNUSUAL BRUISING OR BLEEDING *URINARY PROBLEMS (pain or burning when urinating, or frequent urination) *BOWEL PROBLEMS (unusual diarrhea, constipation, pain near the anus) TENDERNESS IN MOUTH AND THROAT WITH OR WITHOUT PRESENCE OF ULCERS (sore throat, sores in mouth, or a toothache) UNUSUAL RASH, SWELLING OR PAIN  UNUSUAL VAGINAL DISCHARGE OR ITCHING   Items with * indicate a potential emergency and should be followed up as soon as possible or go to the Emergency Department if any problems should  occur.  Please show the CHEMOTHERAPY ALERT CARD or IMMUNOTHERAPY ALERT CARD at check-in to the Emergency Department and triage nurse.  Should you have questions after your visit or need to cancel or reschedule your appointment, please contact [facility]  [phone] and follow the prompts.  Office hours are 8:00 a.m. to 4:30 p.m. Monday - Friday. Please note that voicemails left after 4:00 p.m. may not be returned until the following business day.  We are closed weekends and major holidays. You have access to a nurse at all times for urgent questions. Please call the main number to the clinic [phone] and follow the prompts.  For any non-urgent questions, you may also contact your provider using MyChart. We now offer e-Visits for anyone 63 and older to request care online for non-urgent symptoms. For details visit [email or web address].   Also download the MyChart app! Go to the app store, search "MyChart", open the app, select [facility], and log in with your MyChart username and password.  Due to Covid, a mask is required upon entering the hospital/clinic. If you do not have a mask, one will be given to you upon arrival. For doctor visits, patients may have 1 support person aged 51 or older with them. For treatment visits, patients cannot have anyone with them due to current Covid guidelines and our immunocompromised population.

## 2021-10-01 ENCOUNTER — Other Ambulatory Visit: Payer: Self-pay

## 2021-10-01 ENCOUNTER — Telehealth: Payer: Self-pay | Admitting: *Deleted

## 2021-10-01 DIAGNOSIS — C50412 Malignant neoplasm of upper-outer quadrant of left female breast: Secondary | ICD-10-CM

## 2021-10-01 NOTE — Telephone Encounter (Signed)
Patient called reporting that she had treatment yesterday and last night at 830 PM, she awoke from sleeping having a buzzing sound in her ears and it has not gone away. She also reports that she can hear herself like being in a can. She does not have pain and is not running any fever. She said she looked up symptoms on line and saw that it can be side effects of medications she is on and that it could cause hearing loss and should see an ENT doctor. Please advise

## 2021-10-01 NOTE — Telephone Encounter (Addendum)
Called patient and got voice mail left message that she is being referred to ENT

## 2021-10-01 NOTE — Telephone Encounter (Signed)
Per MD, this could be from previous tx. Will sent referral to ENT.

## 2021-10-13 ENCOUNTER — Other Ambulatory Visit: Payer: Self-pay | Admitting: *Deleted

## 2021-10-14 ENCOUNTER — Inpatient Hospital Stay

## 2021-10-14 ENCOUNTER — Other Ambulatory Visit: Payer: Self-pay

## 2021-10-14 ENCOUNTER — Inpatient Hospital Stay (HOSPITAL_BASED_OUTPATIENT_CLINIC_OR_DEPARTMENT_OTHER): Admitting: Oncology

## 2021-10-14 ENCOUNTER — Encounter: Payer: Self-pay | Admitting: Oncology

## 2021-10-14 VITALS — BP 125/84 | HR 74 | Temp 97.9°F | Resp 18 | Wt 238.7 lb

## 2021-10-14 DIAGNOSIS — C50412 Malignant neoplasm of upper-outer quadrant of left female breast: Secondary | ICD-10-CM | POA: Diagnosis not present

## 2021-10-14 DIAGNOSIS — Z95828 Presence of other vascular implants and grafts: Secondary | ICD-10-CM | POA: Diagnosis not present

## 2021-10-14 DIAGNOSIS — Z5111 Encounter for antineoplastic chemotherapy: Secondary | ICD-10-CM | POA: Diagnosis not present

## 2021-10-14 DIAGNOSIS — T451X5A Adverse effect of antineoplastic and immunosuppressive drugs, initial encounter: Secondary | ICD-10-CM

## 2021-10-14 DIAGNOSIS — Z171 Estrogen receptor negative status [ER-]: Secondary | ICD-10-CM

## 2021-10-14 DIAGNOSIS — D696 Thrombocytopenia, unspecified: Secondary | ICD-10-CM

## 2021-10-14 DIAGNOSIS — G620 Drug-induced polyneuropathy: Secondary | ICD-10-CM

## 2021-10-14 DIAGNOSIS — T451X5D Adverse effect of antineoplastic and immunosuppressive drugs, subsequent encounter: Secondary | ICD-10-CM

## 2021-10-14 LAB — CBC WITH DIFFERENTIAL/PLATELET
Abs Immature Granulocytes: 0.19 10*3/uL — ABNORMAL HIGH (ref 0.00–0.07)
Basophils Absolute: 0 10*3/uL (ref 0.0–0.1)
Basophils Relative: 1 %
Eosinophils Absolute: 0 10*3/uL (ref 0.0–0.5)
Eosinophils Relative: 1 %
HCT: 33.8 % — ABNORMAL LOW (ref 36.0–46.0)
Hemoglobin: 10.6 g/dL — ABNORMAL LOW (ref 12.0–15.0)
Immature Granulocytes: 3 %
Lymphocytes Relative: 17 %
Lymphs Abs: 1 10*3/uL (ref 0.7–4.0)
MCH: 29.1 pg (ref 26.0–34.0)
MCHC: 31.4 g/dL (ref 30.0–36.0)
MCV: 92.9 fL (ref 80.0–100.0)
Monocytes Absolute: 1 10*3/uL (ref 0.1–1.0)
Monocytes Relative: 17 %
Neutro Abs: 3.8 10*3/uL (ref 1.7–7.7)
Neutrophils Relative %: 61 %
Platelets: 121 10*3/uL — ABNORMAL LOW (ref 150–400)
RBC: 3.64 MIL/uL — ABNORMAL LOW (ref 3.87–5.11)
RDW: 17 % — ABNORMAL HIGH (ref 11.5–15.5)
WBC: 6.1 10*3/uL (ref 4.0–10.5)
nRBC: 0 % (ref 0.0–0.2)

## 2021-10-14 LAB — COMPREHENSIVE METABOLIC PANEL
ALT: 20 U/L (ref 0–44)
AST: 24 U/L (ref 15–41)
Albumin: 4.4 g/dL (ref 3.5–5.0)
Alkaline Phosphatase: 81 U/L (ref 38–126)
Anion gap: 6 (ref 5–15)
BUN: 13 mg/dL (ref 6–20)
CO2: 26 mmol/L (ref 22–32)
Calcium: 9.2 mg/dL (ref 8.9–10.3)
Chloride: 105 mmol/L (ref 98–111)
Creatinine, Ser: 0.58 mg/dL (ref 0.44–1.00)
GFR, Estimated: >60 mL/min (ref >60)
Glucose, Bld: 92 mg/dL (ref 70–99)
Potassium: 4.4 mmol/L (ref 3.5–5.1)
Sodium: 137 mmol/L (ref 135–145)
Total Bilirubin: 0.5 mg/dL (ref 0.3–1.2)
Total Protein: 7.6 g/dL (ref 6.5–8.1)

## 2021-10-14 MED ORDER — SODIUM CHLORIDE 0.9 % IV SOLN
Freq: Once | INTRAVENOUS | Status: AC
  Filled 2021-10-14: qty 250

## 2021-10-14 MED ORDER — SODIUM CHLORIDE 0.9 % IV SOLN
150.0000 mg | Freq: Once | INTRAVENOUS | Status: AC
  Administered 2021-10-14: 150 mg via INTRAVENOUS
  Filled 2021-10-14: qty 150

## 2021-10-14 MED ORDER — PALONOSETRON HCL INJECTION 0.25 MG/5ML
0.2500 mg | Freq: Once | INTRAVENOUS | Status: AC
  Administered 2021-10-14: 0.25 mg via INTRAVENOUS
  Filled 2021-10-14: qty 5

## 2021-10-14 MED ORDER — SODIUM CHLORIDE 0.9 % IV SOLN
10.0000 mg | Freq: Once | INTRAVENOUS | Status: AC
  Administered 2021-10-14: 10 mg via INTRAVENOUS
  Filled 2021-10-14: qty 10

## 2021-10-14 MED ORDER — SODIUM CHLORIDE 0.9 % IV SOLN
600.0000 mg/m2 | Freq: Once | INTRAVENOUS | Status: AC
  Administered 2021-10-14: 1320 mg via INTRAVENOUS
  Filled 2021-10-14: qty 50

## 2021-10-14 MED ORDER — HEPARIN SOD (PORK) LOCK FLUSH 100 UNIT/ML IV SOLN
INTRAVENOUS | Status: AC
  Administered 2021-10-14: 500 [IU]
  Filled 2021-10-14: qty 5

## 2021-10-14 MED ORDER — SODIUM CHLORIDE 0.9% FLUSH
10.0000 mL | Freq: Once | INTRAVENOUS | Status: AC
  Administered 2021-10-14: 10 mL via INTRAVENOUS
  Filled 2021-10-14: qty 10

## 2021-10-14 MED ORDER — DOXORUBICIN HCL CHEMO IV INJECTION 2 MG/ML
60.0000 mg/m2 | Freq: Once | INTRAVENOUS | Status: AC
  Administered 2021-10-14: 132 mg via INTRAVENOUS
  Filled 2021-10-14: qty 50

## 2021-10-14 MED ORDER — PEGFILGRASTIM 6 MG/0.6ML ~~LOC~~ PSKT
6.0000 mg | PREFILLED_SYRINGE | Freq: Once | SUBCUTANEOUS | Status: AC
  Administered 2021-10-14: 6 mg via SUBCUTANEOUS
  Filled 2021-10-14: qty 0.6

## 2021-10-14 MED ORDER — HEPARIN SOD (PORK) LOCK FLUSH 100 UNIT/ML IV SOLN
500.0000 [IU] | Freq: Once | INTRAVENOUS | Status: AC | PRN
  Filled 2021-10-14: qty 5

## 2021-10-14 NOTE — Progress Notes (Signed)
Pt here for follow up. No new concerns voiced.   

## 2021-10-14 NOTE — Progress Notes (Signed)
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  female with PMH listed below was seen in consultation at the request of  Renee Rival, NP  for evaluation of triple negative breast cancer.  03/30/2021, screening mammogram with 3D 2 nodular areas of asymmetric density demonstrated within the superior lateral aspect of the left breast middle third depth.  Finding was best appreciated on 3D tomosynthesis imaging. Targeted ultrasound showed left upper outer breast 1:30 position 1.1 cm round hypoechoic mass with angular margins. 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 Bary Castilla yesterday.  She presents to establish care with me today Patient did not notice this left mass prior to the mammogram.   Case was discussed on Tumor board.  Pathology reports and mammogram/ultrasound were done at outside facility and results were reviewed and discussed with patient and her husband.  LN status was not mentioned on her [place]. Recommend additional images with mammogram and MRI, neoadjuvant chemotherapy  Family history of breast cancer: Breast cancer in her sister, her late 12s, early 75s; and being paternal grandmother Menarche: 33 Age at first live childbirth: 41  patient has 2 sons and 1 daughter.  Daughter has passed away Used OCP:  Used estrogen and progesterone therapy: Denies History of Radiation to the chest: Denies Previous of breast biopsy: Denies  /25/2022 diagnostic mammogram of left breast showed 2.1 cm biopsy-proven  malignancy in the lateral left breast at 2:00, 4 cm from nipple.  Directly adjacent cystic mass, 1.5 cm in size, consistent with biopsy hematoma.  1 abnormal and 1 borderline abnormal left axillary lymph node  05/18/2021 bilateral breast MRI with and without contrast showed 2.5 x 3 x 2.5 cm hematoma containing biopsy clip artifact within the upper outer left breast.  1.6 cm nodular non-mass-like enhancement 2.5 cm posterior/superior to the biopsy clip may represent biopsy-proven malignancy or additional areas of malignancy.  Single abnormal appearing left axillary lymph node with eccentric cortical thickening.  No MRI evidence of the right breast malignancy.  #05/07/2021, Mediport was placed by Dr. Bary Castilla #05/14/2021 echocardiogram showed LVEF 60-55%  05/20/2021 Slide consultation of her left breast biopsy from outside institution - invasive mammary carcinoma, no special type, grade 3, DCIS and LVI not identified. ER negative, PR negative, HER2 IHC negative. Ki 67 70-80%    05/20/2021 ultrasound-guided left axillary lymph node was negative for malignancy.  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  D15- not done due to cytopenia  INTERVAL HISTORY Peggy Bates is a 55 y.o. female who has above history reviewed by me today presents for follow up visit for management of left triple negative breast cancer Patient is on neoadjuvant chemotherapy. Interval MRI showed great treatment response.  Patient reports feeling well.  She had some rash after last chemotherapy.  She took Benadryl which helped her symptoms.  She also has had influenza vaccination.  Today she reports doing well.  No  nausea vomiting diarrhea. . . Review of Systems  Constitutional:  Positive for fatigue. Negative for appetite change, chills and  fever.  HENT:   Negative for hearing loss and voice change.   Eyes:  Negative for eye problems.  Respiratory:  Negative for chest tightness and cough.   Cardiovascular:  Negative for chest pain.  Gastrointestinal:  Negative for abdominal distention, abdominal pain and blood in stool.  Endocrine: Negative for hot flashes.  Genitourinary:  Negative for difficulty urinating and frequency.   Musculoskeletal:  Negative for arthralgias.  Skin:  Negative for itching and rash.  Neurological:  Positive for numbness. Negative for extremity weakness.  Hematological:  Negative for adenopathy.  Psychiatric/Behavioral:  Negative for confusion.    MEDICAL HISTORY:  Past Medical History:  Diagnosis Date   Arthritis    knees   COVID-19 12/2020   Depression    Family history of breast cancer    Goiter    Malignant neoplasm of left female breast ([place]) 04/07/2021    SURGICAL HISTORY: Past Surgical History:  Procedure Laterality Date   BREAST BIOPSY Left 04/07/2021   [facility] [place]:u/s bx triple neg   BREAST BIOPSY Left 05/20/2021   [place] Axilla Bx, hydro 3 marker, path pending    KNEE ARTHROSCOPY     PORTACATH PLACEMENT Right 05/07/2021   Procedure: INSERTION PORT-A-CATH;  Surgeon: Robert Bellow, MD;  Location: [facility] ORS;  Service: General;  Laterality: Right;   TOOTH EXTRACTION     TRANSCERVICAL UTERINE FIBROID(S) ABLATION  2010    SOCIAL HISTORY: Social History   Socioeconomic History   Marital status: Married    Spouse name: Not on file   Number of children: Not on file   Years of education: Not on file   Highest education level: Not on file  Occupational History   Not on file  Tobacco Use   Smoking status: Never   Smokeless tobacco: Never  Vaping Use   Vaping Use: Never used  Substance and Sexual Activity   Alcohol use: Never   Drug use: Never   Sexual activity: Yes  Other Topics Concern   Not on file  Social History Narrative   ** Merged History  Encounter **       Social Determinants of Health   Financial Resource Strain: Not on file  Food Insecurity: Not on file  Transportation Needs: Not on file  Physical Activity: Not on file  Stress: Not on file  Social Connections: Not on file  Intimate Partner Violence: Not on file    FAMILY HISTORY: Family History  Problem Relation Age of Onset   Breast cancer Paternal Grandmother    Breast cancer Sister        dx 87s, recurrence x3   Diabetes Sister    Diabetes Father    Throat cancer Maternal Grandmother     ALLERGIES:  has No Known Allergies.  MEDICATIONS:  Current Outpatient Medications  Medication Sig Dispense Refill   chlorhexidine (PERIDEX) 0.12 % solution Use as directed 10 mLs in the mouth or throat 2 (two) times daily. 473 mL 2   cholecalciferol (VITAMIN D) 25 MCG (1000 UNIT) tablet Take 1,000 Units by mouth daily.     dexamethasone (DECADRON) 4 MG tablet Take 2 tablets once a day for 2 days after carboplatin and AC chemotherapy. Take with food. 30 tablet 0   diclofenac Sodium (VOLTAREN) 1 % GEL Apply 1 application topically daily.     gabapentin (NEURONTIN) 600 MG  tablet Take 600 mg by mouth every morning.     Glucosamine-Chondroitin (MOVE FREE PO) Take 1 tablet by mouth daily.     levonorgestrel (MIRENA) 20 MCG/DAY IUD 1 each by Intrauterine route once.     lidocaine-prilocaine (EMLA) cream Apply to affected area once 30 g 3   meloxicam (MOBIC) 15 MG tablet Take 15 mg by mouth daily.     ondansetron (ZOFRAN) 8 MG tablet Take 1 tablet (8 mg total) by mouth 2 (two) times daily as needed. Start on the third day after carboplatin and AC chemotherapy. 30 tablet 1   prochlorperazine (COMPAZINE) 10 MG tablet Take 1 tablet (10 mg total) by mouth every 6 (six) hours as needed (Nausea or vomiting). 30 tablet 1   phentermine (ADIPEX-P) 37.5 MG tablet 1 tablet (Patient not taking: No sig reported)     No current facility-administered medications for this visit.    Facility-Administered Medications Ordered in Other Visits  Medication Dose Route Frequency Provider Last Rate Last Admin   heparin lock flush 100 unit/mL  500 Units Intravenous Once Earlie Server, MD       sodium chloride flush (NS) 0.9 % injection 10 mL  10 mL Intravenous Once Earlie Server, MD         PHYSICAL EXAMINATION: ECOG PERFORMANCE STATUS: 0 - Asymptomatic Vitals:   10/14/21 1337  BP: 125/84  Pulse: 74  Resp: 18  Temp: 97.9 F (36.6 C)   Filed Weights   10/14/21 1337  Weight: 238 lb 11.2 oz (108.3 kg)    Physical Exam Constitutional:      General: She is not in acute distress. HENT:     Head: Normocephalic and atraumatic.  Eyes:     General: No scleral icterus. Cardiovascular:     Rate and Rhythm: Normal rate and regular rhythm.     Heart sounds: Normal heart sounds.  Pulmonary:     Effort: Pulmonary effort is normal. No respiratory distress.     Breath sounds: No wheezing.  Abdominal:     General: Bowel sounds are normal. There is no distension.     Palpations: Abdomen is soft.  Musculoskeletal:        General: No deformity. Normal range of motion.     Cervical back: Normal range of motion and neck supple.  Skin:    General: Skin is warm and dry.     Findings: No erythema or rash.  Neurological:     Mental Status: She is alert and oriented to person, place, and time. Mental status is at baseline.     Cranial Nerves: No cranial nerve deficit.     Coordination: Coordination normal.  Psychiatric:        Mood and Affect: Mood normal.    LABORATORY DATA:  I have reviewed the data as listed Lab Results  Component Value Date   WBC 6.1 10/14/2021   HGB 10.6 (L) 10/14/2021   HCT 33.8 (L) 10/14/2021   MCV 92.9 10/14/2021   PLT 121 (L) 10/14/2021   Recent Labs    09/23/21 0806 09/30/21 1303 10/14/21 1244  NA 137 137 137  K 4.0 4.0 4.4  CL 106 108 105  CO2 '24 24 26  ' GLUCOSE 101* 91 92  BUN 16 23* 13  CREATININE 0.52 0.67 0.58  CALCIUM 9.0 9.0 9.2   GFRNONAA >60 >60 >60  PROT 7.4 7.5 7.6  ALBUMIN 4.0 4.2 4.4  AST 23 34 24  ALT 22 35 20  ALKPHOS 82 79  81  BILITOT 0.2* 0.2* 0.5    Iron/TIBC/Ferritin/ %Sat No results found for: IRON, TIBC, FERRITIN, IRONPCTSAT    RADIOGRAPHIC STUDIES: I have personally reviewed the radiological images as listed and agreed with the findings in the report. MR BREAST BILATERAL W WO CONTRAST INC CAD  Result Date: 08/31/2021 CLINICAL DATA:  Follow-up known left breast cancer after neoadjuvant therapy. LABS:  None EXAM: BILATERAL BREAST MRI WITH AND WITHOUT CONTRAST TECHNIQUE: Multiplanar, multisequence MR images of both breasts were obtained prior to and following the intravenous administration of 10 ml of Gadavist Three-dimensional MR images were rendered by post-processing of the original MR data on an independent workstation. The three-dimensional MR images were interpreted, and findings are reported in the following complete MRI report for this study. Three dimensional images were evaluated at the independent interpreting workstation using the DynaCAD thin client. COMPARISON:  Previous exam(s). FINDINGS: Breast composition: b. Scattered fibroglandular tissue. Background parenchymal enhancement: Minimal Right breast: No mass or abnormal enhancement. Left breast: No abnormal enhancement remains in the left breast. Specifically, the previously identified invasive breast cancer on the left is no longer visualized. The previously biopsied DCIS is no longer visualized. Lymph nodes: A clip is seen in the previously biopsied left axillary lymph node. No change in lymph nodes identified. Ancillary findings:  None. IMPRESSION: No abnormal enhancement remains in the left breast. Complete response to chemotherapy is identified. No MRI evidence of malignancy in either breast. RECOMMENDATION: Recommend continued surgical and oncologic follow up. BI-RADS CATEGORY  6: Known biopsy-proven malignancy. Electronically Signed   By:  Dorise Bullion III M.D.   On: 08/31/2021 16:23      ASSESSMENT & PLAN:  1. Encounter for antineoplastic chemotherapy   2. Carcinoma of upper-outer quadrant of left breast in female, estrogen receptor negative ([place])   3. 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 Thrombocytopenia ([place])   .Cancer Staging Carcinoma of upper-outer quadrant of left breast in female, estrogen receptor negative ([place]) Staging form: Breast, AJCC 8th Edition - Clinical stage from 04/29/2021: Stage IIB (cT2, cN0, cM0, G3, ER-, PR-, HER2-) - Signed by Earlie Server, MD on 06/04/2021   #Left breast invasive mammary carcinoma, triple negative, + high-grade DCIS ER positive cT2 N0, stage IIB 08/30/2021 MRI breast showed no abnormal enhancement remains in the left breast. Complete response to chemotherapy is identified. No MRI evidence of malignancy in either breast Labs reviewed and discussed with patient.  Proceed with cycle 3 dd AC  #Rash could be secondary to as needed rash/itchiness.  She may also use dexamethasone for 2 days after chemotherapy. #Chemotherapy-induced neutropenia.  Patient has had G-CSF support.  Neutropenia has improved. #Back pain, patient prefers to defer imaging studies.  Symptoms are stable.  #Transaminitis, resolved. #Chemotherapy-induced neuropathy, patient is currently on gabapentin 600 mg daily for mood disorder.  She prefers not to further increase gabapentin dosage or add additional agents for neuropathy.  Her symptoms are stable.  Continue to monitor.   CDC recommendation of COVID-19 vaccination booster was discussed with patient. We spent sufficient time to discuss many aspect of care, questions were answered to patient's satisfaction.  All questions were answered. The patient knows to call the clinic with any problems questions or concerns.  cc Renee Rival, NP    Return of visit: 2 weeks, lab MD ddAC with Sherrine Maples, MD, [name] 10/14/2021

## 2021-10-14 NOTE — Patient Instructions (Signed)
[facility]  Discharge Instructions: Thank you for choosing [facility] to provide your oncology and hematology care.  If you have a lab appointment with the [facility], please go directly to the [facility] and check in at the registration area.  Wear comfortable clothing and clothing appropriate for easy access to any Portacath or PICC line.   We strive to give you quality time with your provider. You may need to reschedule your appointment if you arrive late (15 or more minutes).  Arriving late affects you and other patients whose appointments are after yours.  Also, if you miss three or more appointments without notifying the office, you may be dismissed from the clinic at the provider's discretion.      For prescription refill requests, have your pharmacy contact our office and allow 72 hours for refills to be completed.    Today you received the following chemotherapy and/or immunotherapy agents Adriamycin and Cytoxan       To help prevent nausea and vomiting after your treatment, we encourage you to take your nausea medication as directed.  BELOW ARE SYMPTOMS THAT SHOULD BE REPORTED IMMEDIATELY: *FEVER GREATER THAN 100.4 F (38 C) OR HIGHER *CHILLS OR SWEATING *NAUSEA AND VOMITING THAT IS NOT CONTROLLED WITH YOUR NAUSEA MEDICATION *UNUSUAL SHORTNESS OF BREATH *UNUSUAL BRUISING OR BLEEDING *URINARY PROBLEMS (pain or burning when urinating, or frequent urination) *BOWEL PROBLEMS (unusual diarrhea, constipation, pain near the anus) TENDERNESS IN MOUTH AND THROAT WITH OR WITHOUT PRESENCE OF ULCERS (sore throat, sores in mouth, or a toothache) UNUSUAL RASH, SWELLING OR PAIN  UNUSUAL VAGINAL DISCHARGE OR ITCHING   Items with * indicate a potential emergency and should be followed up as soon as possible or go to the Emergency Department if any problems should occur.  Please show the CHEMOTHERAPY ALERT CARD or IMMUNOTHERAPY ALERT CARD  at check-in to the Emergency Department and triage nurse.  Should you have questions after your visit or need to cancel or reschedule your appointment, please contact [facility]  [phone] and follow the prompts.  Office hours are 8:00 a.m. to 4:30 p.m. Monday - Friday. Please note that voicemails left after 4:00 p.m. may not be returned until the following business day.  We are closed weekends and major holidays. You have access to a nurse at all times for urgent questions. Please call the main number to the clinic [phone] and follow the prompts.  For any non-urgent questions, you may also contact your provider using MyChart. We now offer e-Visits for anyone 37 and older to request care online for non-urgent symptoms. For details visit [email or web address].   Also download the MyChart app! Go to the app store, search "MyChart", open the app, select [facility], and log in with your MyChart username and password.  Due to Covid, a mask is required upon entering the hospital/clinic. If you do not have a mask, one will be given to you upon arrival. For doctor visits, patients may have 1 support person aged 12 or older with them. For treatment visits, patients cannot have anyone with them due to current Covid guidelines and our immunocompromised population.

## 2021-10-15 ENCOUNTER — Telehealth: Payer: Self-pay

## 2021-10-15 NOTE — Telephone Encounter (Signed)
Pt had requested an ENT referral due buzzing in ears. Referral was sent to ENT. Contacted ENT today to follow up on referral and they stated that appt was scheduled for 10/20, but she called and cancelled.

## 2021-10-16 ENCOUNTER — Encounter: Payer: Self-pay | Admitting: Oncology

## 2021-10-16 MED ORDER — CHLORHEXIDINE GLUCONATE 0.12 % MT SOLN: 10.0000 mL | Freq: Two times a day (BID) | OROMUCOSAL | 2 refills | Status: DC

## 2021-10-28 ENCOUNTER — Inpatient Hospital Stay (HOSPITAL_BASED_OUTPATIENT_CLINIC_OR_DEPARTMENT_OTHER): Admitting: Oncology

## 2021-10-28 ENCOUNTER — Encounter: Payer: Self-pay | Admitting: Oncology

## 2021-10-28 ENCOUNTER — Other Ambulatory Visit: Payer: Self-pay

## 2021-10-28 ENCOUNTER — Inpatient Hospital Stay

## 2021-10-28 ENCOUNTER — Inpatient Hospital Stay: Attending: Oncology

## 2021-10-28 VITALS — BP 117/80 | HR 85 | Temp 97.8°F | Resp 20 | Wt 242.8 lb

## 2021-10-28 DIAGNOSIS — C50412 Malignant neoplasm of upper-outer quadrant of left female breast: Secondary | ICD-10-CM

## 2021-10-28 DIAGNOSIS — D6481 Anemia due to antineoplastic chemotherapy: Secondary | ICD-10-CM

## 2021-10-28 DIAGNOSIS — Z801 Family history of malignant neoplasm of trachea, bronchus and lung: Secondary | ICD-10-CM | POA: Diagnosis not present

## 2021-10-28 DIAGNOSIS — Z171 Estrogen receptor negative status [ER-]: Secondary | ICD-10-CM | POA: Insufficient documentation

## 2021-10-28 DIAGNOSIS — Z5111 Encounter for antineoplastic chemotherapy: Secondary | ICD-10-CM | POA: Insufficient documentation

## 2021-10-28 DIAGNOSIS — M549 Dorsalgia, unspecified: Secondary | ICD-10-CM | POA: Diagnosis not present

## 2021-10-28 DIAGNOSIS — Z803 Family history of malignant neoplasm of breast: Secondary | ICD-10-CM | POA: Insufficient documentation

## 2021-10-28 DIAGNOSIS — T451X5D Adverse effect of antineoplastic and immunosuppressive drugs, subsequent encounter: Secondary | ICD-10-CM | POA: Insufficient documentation

## 2021-10-28 DIAGNOSIS — G620 Drug-induced polyneuropathy: Secondary | ICD-10-CM

## 2021-10-28 DIAGNOSIS — D696 Thrombocytopenia, unspecified: Secondary | ICD-10-CM | POA: Diagnosis not present

## 2021-10-28 DIAGNOSIS — Z5189 Encounter for other specified aftercare: Secondary | ICD-10-CM | POA: Diagnosis not present

## 2021-10-28 DIAGNOSIS — D701 Agranulocytosis secondary to cancer chemotherapy: Secondary | ICD-10-CM | POA: Diagnosis not present

## 2021-10-28 DIAGNOSIS — F39 Unspecified mood [affective] disorder: Secondary | ICD-10-CM | POA: Diagnosis not present

## 2021-10-28 DIAGNOSIS — D61810 Antineoplastic chemotherapy induced pancytopenia: Secondary | ICD-10-CM | POA: Diagnosis not present

## 2021-10-28 DIAGNOSIS — T451X5A Adverse effect of antineoplastic and immunosuppressive drugs, initial encounter: Secondary | ICD-10-CM

## 2021-10-28 LAB — CBC WITH DIFFERENTIAL/PLATELET
Abs Immature Granulocytes: 0.07 10*3/uL (ref 0.00–0.07)
Basophils Absolute: 0 10*3/uL (ref 0.0–0.1)
Basophils Relative: 0 %
Eosinophils Absolute: 0 10*3/uL (ref 0.0–0.5)
Eosinophils Relative: 0 %
HCT: 30 % — ABNORMAL LOW (ref 36.0–46.0)
Hemoglobin: 9.5 g/dL — ABNORMAL LOW (ref 12.0–15.0)
Immature Granulocytes: 2 %
Lymphocytes Relative: 12 %
Lymphs Abs: 0.6 10*3/uL — ABNORMAL LOW (ref 0.7–4.0)
MCH: 29.4 pg (ref 26.0–34.0)
MCHC: 31.7 g/dL (ref 30.0–36.0)
MCV: 92.9 fL (ref 80.0–100.0)
Monocytes Absolute: 0.7 10*3/uL (ref 0.1–1.0)
Monocytes Relative: 14 %
Neutro Abs: 3.4 10*3/uL (ref 1.7–7.7)
Neutrophils Relative %: 72 %
Platelets: 139 10*3/uL — ABNORMAL LOW (ref 150–400)
RBC: 3.23 MIL/uL — ABNORMAL LOW (ref 3.87–5.11)
RDW: 15.6 % — ABNORMAL HIGH (ref 11.5–15.5)
WBC: 4.8 10*3/uL (ref 4.0–10.5)
nRBC: 0 % (ref 0.0–0.2)

## 2021-10-28 LAB — URINALYSIS, COMPLETE (UACMP) WITH MICROSCOPIC
Bilirubin Urine: NEGATIVE
Glucose, UA: NEGATIVE mg/dL
Hgb urine dipstick: NEGATIVE
Ketones, ur: NEGATIVE mg/dL
Nitrite: NEGATIVE
Protein, ur: NEGATIVE mg/dL
Specific Gravity, Urine: 1.026 (ref 1.005–1.030)
pH: 5 (ref 5.0–8.0)

## 2021-10-28 LAB — COMPREHENSIVE METABOLIC PANEL
ALT: 23 U/L (ref 0–44)
AST: 22 U/L (ref 15–41)
Albumin: 4.2 g/dL (ref 3.5–5.0)
Alkaline Phosphatase: 95 U/L (ref 38–126)
Anion gap: 6 (ref 5–15)
BUN: 15 mg/dL (ref 6–20)
CO2: 26 mmol/L (ref 22–32)
Calcium: 9 mg/dL (ref 8.9–10.3)
Chloride: 105 mmol/L (ref 98–111)
Creatinine, Ser: 0.67 mg/dL (ref 0.44–1.00)
GFR, Estimated: >60 mL/min (ref >60)
Glucose, Bld: 102 mg/dL — ABNORMAL HIGH (ref 70–99)
Potassium: 4 mmol/L (ref 3.5–5.1)
Sodium: 137 mmol/L (ref 135–145)
Total Bilirubin: 0.3 mg/dL (ref 0.3–1.2)
Total Protein: 7.5 g/dL (ref 6.5–8.1)

## 2021-10-28 MED ORDER — SODIUM CHLORIDE 0.9 % IV SOLN
10.0000 mg | Freq: Once | INTRAVENOUS | Status: AC
  Administered 2021-10-28: 10 mg via INTRAVENOUS
  Filled 2021-10-28: qty 10

## 2021-10-28 MED ORDER — PEGFILGRASTIM 6 MG/0.6ML ~~LOC~~ PSKT
6.0000 mg | PREFILLED_SYRINGE | Freq: Once | SUBCUTANEOUS | Status: AC
  Administered 2021-10-28: 6 mg via SUBCUTANEOUS
  Filled 2021-10-28: qty 0.6

## 2021-10-28 MED ORDER — SODIUM CHLORIDE 0.9 % IV SOLN
Freq: Once | INTRAVENOUS | Status: AC
  Filled 2021-10-28: qty 250

## 2021-10-28 MED ORDER — HEPARIN SOD (PORK) LOCK FLUSH 100 UNIT/ML IV SOLN
INTRAVENOUS | Status: AC
  Filled 2021-10-28: qty 5

## 2021-10-28 MED ORDER — HEPARIN SOD (PORK) LOCK FLUSH 100 UNIT/ML IV SOLN
500.0000 [IU] | Freq: Once | INTRAVENOUS | Status: AC | PRN
  Administered 2021-10-28: 500 [IU]
  Filled 2021-10-28: qty 5

## 2021-10-28 MED ORDER — DOXORUBICIN HCL CHEMO IV INJECTION 2 MG/ML
60.0000 mg/m2 | Freq: Once | INTRAVENOUS | Status: AC
  Administered 2021-10-28: 132 mg via INTRAVENOUS
  Filled 2021-10-28: qty 50

## 2021-10-28 MED ORDER — PALONOSETRON HCL INJECTION 0.25 MG/5ML
0.2500 mg | Freq: Once | INTRAVENOUS | Status: AC
  Administered 2021-10-28: 0.25 mg via INTRAVENOUS
  Filled 2021-10-28: qty 5

## 2021-10-28 MED ORDER — SODIUM CHLORIDE 0.9 % IV SOLN
600.0000 mg/m2 | Freq: Once | INTRAVENOUS | Status: AC
  Administered 2021-10-28: 1320 mg via INTRAVENOUS
  Filled 2021-10-28: qty 50

## 2021-10-28 MED ORDER — SODIUM CHLORIDE 0.9 % IV SOLN
150.0000 mg | Freq: Once | INTRAVENOUS | Status: AC
  Administered 2021-10-28: 150 mg via INTRAVENOUS
  Filled 2021-10-28: qty 150

## 2021-10-28 NOTE — Progress Notes (Signed)
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  female with PMH listed below was seen in consultation at the request of  Renee Rival, NP  for evaluation of triple negative breast cancer.  03/30/2021, screening mammogram with 3D 2 nodular areas of asymmetric density demonstrated within the superior lateral aspect of the left breast middle third depth.  Finding was best appreciated on 3D tomosynthesis imaging. Targeted ultrasound showed left upper outer breast 1:30 position 1.1 cm round hypoechoic mass with angular margins. 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 Bary Castilla yesterday.  She presents to establish care with me today Patient did not notice this left mass prior to the mammogram.   Case was discussed on Tumor board.  Pathology reports and mammogram/ultrasound were done at outside facility and results were reviewed and discussed with patient and her husband.  LN status was not mentioned on her [place]. Recommend additional images with mammogram and MRI, neoadjuvant chemotherapy  Family history of breast cancer: Breast cancer in her sister, her late 60s, early 45s; and being paternal grandmother Menarche: 54 Age at first live childbirth: 44  patient has 2 sons and 1 daughter.  Daughter has passed away Used OCP:  Used estrogen and progesterone therapy: Denies History of Radiation to the chest: Denies Previous of breast biopsy: Denies  /25/2022 diagnostic mammogram of left breast showed 2.1 cm biopsy-proven malignancy in the lateral left  breast at 2:00, 4 cm from nipple.  Directly adjacent cystic mass, 1.5 cm in size, consistent with biopsy hematoma.  1 abnormal and 1 borderline abnormal left axillary lymph node  05/18/2021 bilateral breast MRI with and without contrast showed 2.5 x 3 x 2.5 cm hematoma containing biopsy clip artifact within the upper outer left breast.  1.6 cm nodular non-mass-like enhancement 2.5 cm posterior/superior to the biopsy clip may represent biopsy-proven malignancy or additional areas of malignancy.  Single abnormal appearing left axillary lymph node with eccentric cortical thickening.  No MRI evidence of the right breast malignancy.  #05/07/2021, Mediport was placed by Dr. Bary Castilla #05/14/2021 echocardiogram showed LVEF 60-55%  05/20/2021 Slide consultation of her left breast biopsy from outside institution - invasive mammary carcinoma, no special type, grade 3, DCIS and LVI not identified. ER negative, PR negative, HER2 IHC negative. Ki 67 70-80%    05/20/2021 ultrasound-guided left axillary lymph node was negative for malignancy.  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  D15- not done due to cytopenia  INTERVAL HISTORY Peggy Bates is a 55 y.o. female who has above history reviewed by me today presents for follow up visit for management of left triple negative breast cancer Patient is on neoadjuvant chemotherapy.  Patient reports feeling well.  No nausea vomiting diarrhea, fever or chills. + fatigue She saw some blood on the tissue when she wiped after passing urine.  There is no urine in the toilet.  Denies any dysuria, increased frequency. . . Review of  Systems  Constitutional:  Positive for fatigue. Negative for appetite change, chills and fever.  HENT:   Negative for hearing loss and voice change.   Eyes:   Negative for eye problems.  Respiratory:  Negative for chest tightness and cough.   Cardiovascular:  Negative for chest pain.  Gastrointestinal:  Negative for abdominal distention, abdominal pain and blood in stool.  Endocrine: Negative for hot flashes.  Genitourinary:  Negative for difficulty urinating and frequency.   Musculoskeletal:  Negative for arthralgias.  Skin:  Negative for itching and rash.  Neurological:  Positive for numbness. Negative for extremity weakness.  Hematological:  Negative for adenopathy.  Psychiatric/Behavioral:  Negative for confusion.    MEDICAL HISTORY:  Past Medical History:  Diagnosis Date   Arthritis    knees   COVID-19 12/2020   Depression    Family history of breast cancer    Goiter    Malignant neoplasm of left female breast ([place]) 04/07/2021    SURGICAL HISTORY: Past Surgical History:  Procedure Laterality Date   BREAST BIOPSY Left 04/07/2021   [facility] in [place]:u/s bx triple neg   BREAST BIOPSY Left 05/20/2021   [place] Axilla Bx, hydro 3 marker, path pending    KNEE ARTHROSCOPY     PORTACATH PLACEMENT Right 05/07/2021   Procedure: INSERTION PORT-A-CATH;  Surgeon: Robert Bellow, MD;  Location: [facility] ORS;  Service: General;  Laterality: Right;   TOOTH EXTRACTION     TRANSCERVICAL UTERINE FIBROID(S) ABLATION  2010    SOCIAL HISTORY: Social History   Socioeconomic History   Marital status: Married    Spouse name: Not on file   Number of children: Not on file   Years of education: Not on file   Highest education level: Not on file  Occupational History   Not on file  Tobacco Use   Smoking status: Never   Smokeless tobacco: Never  Vaping Use   Vaping Use: Never used  Substance and Sexual Activity   Alcohol use: Never   Drug use: Never   Sexual activity: Yes  Other Topics Concern   Not on file  Social History Narrative   ** Merged History Encounter **       Social Determinants of Health   Financial Resource  Strain: Not on file  Food Insecurity: Not on file  Transportation Needs: Not on file  Physical Activity: Not on file  Stress: Not on file  Social Connections: Not on file  Intimate Partner Violence: Not on file    FAMILY HISTORY: Family History  Problem Relation Age of Onset   Breast cancer Paternal Grandmother    Breast cancer Sister        dx 69s, recurrence x3   Diabetes Sister    Diabetes Father    Throat cancer Maternal Grandmother     ALLERGIES:  has No Known Allergies.  MEDICATIONS:  Current Outpatient Medications  Medication Sig Dispense Refill   chlorhexidine (PERIDEX) 0.12 % solution Use as directed 10 mLs in the mouth or throat 2 (two) times daily. 473 mL 2   cholecalciferol (VITAMIN D) 25 MCG (1000 UNIT) tablet Take 1,000 Units by mouth daily.     dexamethasone (DECADRON) 4 MG tablet Take 2 tablets once a day for 2 days after carboplatin and AC chemotherapy. Take with food. 30 tablet 0   diclofenac Sodium (VOLTAREN) 1 % GEL Apply 1 application topically daily.     gabapentin (NEURONTIN) 600 MG tablet Take 600 mg by mouth every  morning.     Glucosamine-Chondroitin (MOVE FREE PO) Take 1 tablet by mouth daily.     levonorgestrel (MIRENA) 20 MCG/DAY IUD 1 each by Intrauterine route once.     lidocaine-prilocaine (EMLA) cream Apply to affected area once 30 g 3   meloxicam (MOBIC) 15 MG tablet Take 15 mg by mouth daily.     ondansetron (ZOFRAN) 8 MG tablet Take 1 tablet (8 mg total) by mouth 2 (two) times daily as needed. Start on the third day after carboplatin and AC chemotherapy. 30 tablet 1   prochlorperazine (COMPAZINE) 10 MG tablet Take 1 tablet (10 mg total) by mouth every 6 (six) hours as needed (Nausea or vomiting). 30 tablet 1   phentermine (ADIPEX-P) 37.5 MG tablet 1 tablet (Patient not taking: No sig reported)     No current facility-administered medications for this visit.   Facility-Administered Medications Ordered in Other Visits  Medication Dose Route  Frequency Provider Last Rate Last Admin   heparin lock flush 100 unit/mL  500 Units Intravenous Once Earlie Server, MD       sodium chloride flush (NS) 0.9 % injection 10 mL  10 mL Intravenous Once Earlie Server, MD         PHYSICAL EXAMINATION: ECOG PERFORMANCE STATUS: 0 - Asymptomatic Vitals:   10/28/21 0831  BP: 117/80  Pulse: 85  Resp: 20  Temp: 97.8 F (36.6 C)  SpO2: 100%   Filed Weights   10/28/21 0831  Weight: 242 lb 12.8 oz (110.1 kg)    Physical Exam Constitutional:      General: She is not in acute distress. HENT:     Head: Normocephalic and atraumatic.  Eyes:     General: No scleral icterus. Cardiovascular:     Rate and Rhythm: Normal rate and regular rhythm.     Heart sounds: Normal heart sounds.  Pulmonary:     Effort: Pulmonary effort is normal. No respiratory distress.     Breath sounds: No wheezing.  Abdominal:     General: Bowel sounds are normal. There is no distension.     Palpations: Abdomen is soft.  Musculoskeletal:        General: No deformity. Normal range of motion.     Cervical back: Normal range of motion and neck supple.  Skin:    General: Skin is warm and dry.     Findings: No erythema or rash.  Neurological:     Mental Status: She is alert and oriented to person, place, and time. Mental status is at baseline.     Cranial Nerves: No cranial nerve deficit.     Coordination: Coordination normal.  Psychiatric:        Mood and Affect: Mood normal.    LABORATORY DATA:  I have reviewed the data as listed Lab Results  Component Value Date   WBC 4.8 10/28/2021   HGB 9.5 (L) 10/28/2021   HCT 30.0 (L) 10/28/2021   MCV 92.9 10/28/2021   PLT 139 (L) 10/28/2021   Recent Labs    09/30/21 1303 10/14/21 1244 10/28/21 0810  NA 137 137 137  K 4.0 4.4 4.0  CL 108 105 105  CO2 _0 GLUCOSE 91 92 102*  BUN 23* 13 15  CREATININE 0.67 0.58 0.67  CALCIUM 9.0 9.2 9.0  GFRNONAA >60 >60 >60  PROT 7.5 7.6 7.5  ALBUMIN 4.2 4.4 4.2  AST 34 24  22  ALT 35 20 23  ALKPHOS 79 81 95  BILITOT 0.2*  0.5 0.3    Iron/TIBC/Ferritin/ %Sat No results found for: IRON, TIBC, FERRITIN, IRONPCTSAT    RADIOGRAPHIC STUDIES: I have personally reviewed the radiological images as listed and agreed with the findings in the report. MR BREAST BILATERAL W WO CONTRAST INC CAD  Result Date: 08/31/2021 CLINICAL DATA:  Follow-up known left breast cancer after neoadjuvant therapy. LABS:  None EXAM: BILATERAL BREAST MRI WITH AND WITHOUT CONTRAST TECHNIQUE: Multiplanar, multisequence MR images of both breasts were obtained prior to and following the intravenous administration of 10 ml of Gadavist Three-dimensional MR images were rendered by post-processing of the original MR data on an independent workstation. The three-dimensional MR images were interpreted, and findings are reported in the following complete MRI report for this study. Three dimensional images were evaluated at the independent interpreting workstation using the DynaCAD thin client. COMPARISON:  Previous exam(s). FINDINGS: Breast composition: b. Scattered fibroglandular tissue. Background parenchymal enhancement: Minimal Right breast: No mass or abnormal enhancement. Left breast: No abnormal enhancement remains in the left breast. Specifically, the previously identified invasive breast cancer on the left is no longer visualized. The previously biopsied DCIS is no longer visualized. Lymph nodes: A clip is seen in the previously biopsied left axillary lymph node. No change in lymph nodes identified. Ancillary findings:  None. IMPRESSION: No abnormal enhancement remains in the left breast. Complete response to chemotherapy is identified. No MRI evidence of malignancy in either breast. RECOMMENDATION: Recommend continued surgical and oncologic follow up. BI-RADS CATEGORY  6: Known biopsy-proven malignancy. Electronically Signed   By: Dorise Bullion III M.D.   On: 08/31/2021 16:23      ASSESSMENT & PLAN:   1. Carcinoma of upper-outer quadrant of left breast in female, estrogen receptor negative ([place])   2. Encounter for antineoplastic chemotherapy   3. Chemotherapy-induced neuropathy ([place])   4. Thrombocytopenia ([place])   5. Chemotherapy-induced neutropenia ([facility])   6. Anemia due to antineoplastic chemotherapy   .Cancer Staging Carcinoma of upper-outer quadrant of left breast in female, estrogen receptor negative ([place]) Staging form: Breast, AJCC 8th Edition - Clinical stage from 04/29/2021: Stage IIB (cT2, cN0, cM0, G3, ER-, PR-, HER2-) - Signed by Earlie Server, MD on 06/04/2021   #Left breast invasive mammary carcinoma,lateral left,  triple negative,  Left upper outer quadrant + high-grade DCIS ER+ 30% cT2 N0, stage IIB 08/30/2021 MRI breast showed no abnormal enhancement remains in the left breast. Complete response to chemotherapy is identified. No MRI evidence of malignancy in either breast Labs reviewed and discussed with patient. Proceed with cycle 4 ddAC + G-CSF support. She finishes neoadjuvant chemotherapy.  Follow-up with surgery Dr. Bary Castilla  Repeat MRI bilateral breast in 2 weeks.  #Chemotherapy-induced neutropenia.  Patient has had G-CSF support.  Neutropenia has improved. #Back pain, patient prefers to defer imaging studies.  Symptoms are stable.  #Transaminitis, resolved. #Chemotherapy-induced neuropathy, patient is currently on gabapentin 600 mg daily for mood disorder.  She prefers not to further increase gabapentin dosage or add additional agents for neuropathy.  Her symptoms are stable.  Continue to monitor.   CDC recommendation of COVID-19 vaccination booster was discussed with patient.  #Chemotherapy-induced anemia, hemoglobin 9.5.  Close monitor. #1 episode of blood in the urine, check UA urine culture. We spent sufficient time to discuss many aspect of care, questions were answered to patient's satisfaction.  All questions were answered. The patient knows to call the clinic  with any problems questions or concerns.  cc Renee Rival, NP    Return of  visit:patient will proceed with surgery with Dr.Byrnett.   Earlie Server, MD, [name] 10/28/2021

## 2021-10-28 NOTE — Patient Instructions (Signed)
[facility]COL[facility]  Discharge Instructions: Thank you for choosing [facility] to provide your oncology and hematology care.  If you have a lab appointment with the [facility], please go directly to the [facility] and check in at the registration area.  Wear comfortable clothing and clothing appropriate for easy access to any Portacath or PICC line.   We strive to give you quality time with your provider. You may need to reschedule your appointment if you arrive late (15 or more minutes).  Arriving late affects you and other patients whose appointments are after yours.  Also, if you miss three or more appointments without notifying the office, you may be dismissed from the clinic at the provider's discretion.      For prescription refill requests, have your pharmacy contact our office and allow 72 hours for refills to be completed.    Today you received the following chemotherapy and/or immunotherapy agents: Doxorubicin, Cytoxan, Neulasta OnPro      To help prevent nausea and vomiting after your treatment, we encourage you to take your nausea medication as directed.  BELOW ARE SYMPTOMS THAT SHOULD BE REPORTED IMMEDIATELY: *FEVER GREATER THAN 100.4 F (38 C) OR HIGHER *CHILLS OR SWEATING *NAUSEA AND VOMITING THAT IS NOT CONTROLLED WITH YOUR NAUSEA MEDICATION *UNUSUAL SHORTNESS OF BREATH *UNUSUAL BRUISING OR BLEEDING *URINARY PROBLEMS (pain or burning when urinating, or frequent urination) *BOWEL PROBLEMS (unusual diarrhea, constipation, pain near the anus) TENDERNESS IN MOUTH AND THROAT WITH OR WITHOUT PRESENCE OF ULCERS (sore throat, sores in mouth, or a toothache) UNUSUAL RASH, SWELLING OR PAIN  UNUSUAL VAGINAL DISCHARGE OR ITCHING   Items with * indicate a potential emergency and should be followed up as soon as possible or go to the Emergency Department if any problems should occur.  Please show the CHEMOTHERAPY ALERT CARD or  IMMUNOTHERAPY ALERT CARD at check-in to the Emergency Department and triage nurse.  Should you have questions after your visit or need to cancel or reschedule your appointment, please contact [facility]  [phone] and follow the prompts.  Office hours are 8:00 a.m. to 4:30 p.m. Monday - Friday. Please note that voicemails left after 4:00 p.m. may not be returned until the following business day.  We are closed weekends and major holidays. You have access to a nurse at all times for urgent questions. Please call the main number to the clinic [phone] and follow the prompts.  For any non-urgent questions, you may also contact your provider using MyChart. We now offer e-Visits for anyone 71 and older to request care online for non-urgent symptoms. For details visit [email or web address].   Also download the MyChart app! Go to the app store, search "MyChart", open the app, select [facility], and log in with your MyChart username and password.  Due to Covid, a mask is required upon entering the hospital/clinic. If you do not have a mask, one will be given to you upon arrival. For doctor visits, patients may have 1 support person aged 21 or older with them. For treatment visits, patients cannot have anyone with them due to current Covid guidelines and our immunocompromised population. Pegfilgrastim Injection What is this medication? PEGFILGRASTIM (PEG fil gra stim) lowers the risk of infection in people who are receiving chemotherapy. It works by Building control surveyor make more white blood cells, which protects your body from infection. It may also be used to help people who have been exposed to high doses  of radiation. This medicine may be used for other purposes; ask your health care provider or pharmacist if you have questions. COMMON BRAND NAME(S): Rexene Edison, Ziextenzo What should I tell my care team before I take this  medication? They need to know if you have any of these conditions: Kidney disease Latex allergy Ongoing radiation therapy Sickle cell disease Skin reactions to acrylic adhesives (On-Body Injector only) An unusual or allergic reaction to pegfilgrastim, filgrastim, other medications, foods, dyes, or preservatives Pregnant or trying to get pregnant Breast-feeding How should I use this medication? This medication is for injection under the skin. If you get this medication at home, you will be taught how to prepare and give the pre-filled syringe or how to use the On-body Injector. Refer to the patient Instructions for Use for detailed instructions. Use exactly as directed. Tell your care team immediately if you suspect that the On-body Injector may not have performed as intended or if you suspect the use of the On-body Injector resulted in a missed or partial dose. It is important that you put your used needles and syringes in a special sharps container. Do not put them in a trash can. If you do not have a sharps container, call your pharmacist or care team to get one. Talk to your care team about the use of this medication in children. While this medication may be prescribed for selected conditions, precautions do apply. Overdosage: If you think you have taken too much of this medicine contact a poison control center or emergency room at once. NOTE: This medicine is only for you. Do not share this medicine with others. What if I miss a dose? It is important not to miss your dose. Call your care team if you miss your dose. If you miss a dose due to an On-body Injector failure or leakage, a new dose should be administered as soon as possible using a single prefilled syringe for manual use. What may interact with this medication? Interactions have not been studied. This list may not describe all possible interactions. Give your health care provider a list of all the medicines, herbs, non-prescription  drugs, or dietary supplements you use. Also tell them if you smoke, drink alcohol, or use illegal drugs. Some items may interact with your medicine. What should I watch for while using this medication? Your condition will be monitored carefully while you are receiving this medication. You may need blood work done while you are taking this medication. Talk to your care team about your risk of cancer. You may be more at risk for certain types of cancer if you take this medication. If you are going to need a MRI, CT scan, or other procedure, tell your care team that you are using this medication (On-Body Injector only). What side effects may I notice from receiving this medication? Side effects that you should report to your care team as soon as possible: Allergic reactions--skin rash, itching, hives, swelling of the face, lips, tongue, or throat Capillary leak syndrome--stomach or muscle pain, unusual weakness or fatigue, feeling faint or lightheaded, decrease in the amount of urine, swelling of the ankles, hands, or feet, trouble breathing High white blood cell level--fever, fatigue, trouble breathing, night sweats, change in vision, weight loss Inflammation of the aorta--fever, fatigue, back, chest, or stomach pain, severe headache Kidney injury (glomerulonephritis)--decrease in the amount of urine, red or dark brown urine, foamy or bubbly urine, swelling of the ankles, hands, or feet Shortness of breath  or trouble breathing Spleen injury--pain in upper left stomach or shoulder Unusual bruising or bleeding Side effects that usually do not require medical attention (report to your care team if they continue or are bothersome): Bone pain Pain in the hands or feet This list may not describe all possible side effects. Call your doctor for medical advice about side effects. You may report side effects to FDA at 1-800-FDA-1088. Where should I keep my medication? Keep out of the reach of children. If  you are using this medication at home, you will be instructed on how to store it. Throw away any unused medication after the expiration date on the label. NOTE: This sheet is a summary. It may not cover all possible information. If you have questions about this medicine, talk to your doctor, pharmacist, or health care provider.  2022 Elsevier/Gold Standard (2021-08-24 00:00:00) Cyclophosphamide Injection What is this medication? CYCLOPHOSPHAMIDE (sye kloe FOSS fa mide) is a chemotherapy drug. It slows the growth of cancer cells. This medicine is used to treat many types of cancer like lymphoma, myeloma, leukemia, breast cancer, and ovarian cancer, to name a few. This medicine may be used for other purposes; ask your health care provider or pharmacist if you have questions. COMMON BRAND NAME(S): Cytoxan, Neosar What should I tell my care team before I take this medication? They need to know if you have any of these conditions: heart disease history of irregular heartbeat infection kidney disease liver disease low blood counts, like white cells, platelets, or red blood cells on hemodialysis recent or ongoing radiation therapy scarring or thickening of the lungs trouble passing urine an unusual or allergic reaction to cyclophosphamide, other medicines, foods, dyes, or preservatives pregnant or trying to get pregnant breast-feeding How should I use this medication? This drug is usually given as an injection into a vein or muscle or by infusion into a vein. It is administered in a hospital or clinic by a specially trained health care professional. Talk to your pediatrician regarding the use of this medicine in children. Special care may be needed. Overdosage: If you think you have taken too much of this medicine contact a poison control center or emergency room at once. NOTE: This medicine is only for you. Do not share this medicine with others. What if I miss a dose? It is important not to  miss your dose. Call your doctor or health care professional if you are unable to keep an appointment. What may interact with this medication? amphotericin B azathioprine certain antivirals for HIV or hepatitis certain medicines for blood pressure, heart disease, irregular heart beat certain medicines that treat or prevent blood clots like warfarin certain other medicines for cancer cyclosporine etanercept indomethacin medicines that relax muscles for surgery medicines to increase blood counts metronidazole This list may not describe all possible interactions. Give your health care provider a list of all the medicines, herbs, non-prescription drugs, or dietary supplements you use. Also tell them if you smoke, drink alcohol, or use illegal drugs. Some items may interact with your medicine. What should I watch for while using this medication? Your condition will be monitored carefully while you are receiving this medicine. You may need blood work done while you are taking this medicine. Drink water or other fluids as directed. Urinate often, even at night. Some products may contain alcohol. Ask your health care professional if this medicine contains alcohol. Be sure to tell all health care professionals you are taking this medicine. Certain medicines, like metronidazole  and disulfiram, can cause an unpleasant reaction when taken with alcohol. The reaction includes flushing, headache, nausea, vomiting, sweating, and increased thirst. The reaction can last from 30 minutes to several hours. Do not become pregnant while taking this medicine or for 1 year after stopping it. Women should inform their health care professional if they wish to become pregnant or think they might be pregnant. Men should not father a child while taking this medicine and for 4 months after stopping it. There is potential for serious side effects to an unborn child. Talk to your health care professional for more  information. Do not breast-feed an infant while taking this medicine or for 1 week after stopping it. This medicine has caused ovarian failure in some women. This medicine may make it more difficult to get pregnant. Talk to your health care professional if you are concerned about your fertility. This medicine has caused decreased sperm counts in some men. This may make it more difficult to father a child. Talk to your health care professional if you are concerned about your fertility. Call your health care professional for advice if you get a fever, chills, or sore throat, or other symptoms of a cold or flu. Do not treat yourself. This medicine decreases your body's ability to fight infections. Try to avoid being around people who are sick. Avoid taking medicines that contain aspirin, acetaminophen, ibuprofen, naproxen, or ketoprofen unless instructed by your health care professional. These medicines may hide a fever. Talk to your health care professional about your risk of cancer. You may be more at risk for certain types of cancer if you take this medicine. If you are going to need surgery or other procedure, tell your health care professional that you are using this medicine. Be careful brushing or flossing your teeth or using a toothpick because you may get an infection or bleed more easily. If you have any dental work done, tell your dentist you are receiving this medicine. What side effects may I notice from receiving this medication? Side effects that you should report to your doctor or health care professional as soon as possible: allergic reactions like skin rash, itching or hives, swelling of the face, lips, or tongue breathing problems nausea, vomiting signs and symptoms of bleeding such as bloody or black, tarry stools; red or dark brown urine; spitting up blood or brown material that looks like coffee grounds; red spots on the skin; unusual bruising or bleeding from the eyes, gums, or  nose signs and symptoms of heart failure like fast, irregular heartbeat, sudden weight gain; swelling of the ankles, feet, hands signs and symptoms of infection like fever; chills; cough; sore throat; pain or trouble passing urine signs and symptoms of kidney injury like trouble passing urine or change in the amount of urine signs and symptoms of liver injury like dark yellow or brown urine; general ill feeling or flu-like symptoms; light-colored stools; loss of appetite; nausea; right upper belly pain; unusually weak or tired; yellowing of the eyes or skin Side effects that usually do not require medical attention (report to your doctor or health care professional if they continue or are bothersome): confusion decreased hearing diarrhea facial flushing hair loss headache loss of appetite missed menstrual periods signs and symptoms of low red blood cells or anemia such as unusually weak or tired; feeling faint or lightheaded; falls skin discoloration This list may not describe all possible side effects. Call your doctor for medical advice about side effects. You may  report side effects to FDA at 1-800-FDA-1088. Where should I keep my medication? This drug is given in a hospital or clinic and will not be stored at home. NOTE: This sheet is a summary. It may not cover all possible information. If you have questions about this medicine, talk to your doctor, pharmacist, or health care provider.  2022 Elsevier/Gold Standard (2021-08-24 00:00:00) Doxorubicin injection What is this medication? DOXORUBICIN (dox oh ROO bi sin) is a chemotherapy drug. It is used to treat many kinds of cancer like leukemia, lymphoma, neuroblastoma, sarcoma, and Wilms' tumor. It is also used to treat bladder cancer, breast cancer, lung cancer, ovarian cancer, stomach cancer, and thyroid cancer. This medicine may be used for other purposes; ask your health care provider or pharmacist if you have questions. COMMON BRAND  NAME(S): Adriamycin, Adriamycin PFS, Adriamycin RDF, Rubex What should I tell my care team before I take this medication? They need to know if you have any of these conditions: heart disease history of low blood counts caused by a medicine liver disease recent or ongoing radiation therapy an unusual or allergic reaction to doxorubicin, other chemotherapy agents, other medicines, foods, dyes, or preservatives pregnant or trying to get pregnant breast-feeding How should I use this medication? This drug is given as an infusion into a vein. It is administered in a hospital or clinic by a specially trained health care professional. If you have pain, swelling, burning or any unusual feeling around the site of your injection, tell your health care professional right away. Talk to your pediatrician regarding the use of this medicine in children. Special care may be needed. Overdosage: If you think you have taken too much of this medicine contact a poison control center or emergency room at once. NOTE: This medicine is only for you. Do not share this medicine with others. What if I miss a dose? It is important not to miss your dose. Call your doctor or health care professional if you are unable to keep an appointment. What may interact with this medication? This medicine may interact with the following medications: 6-mercaptopurine paclitaxel phenytoin St. John's Wort trastuzumab verapamil This list may not describe all possible interactions. Give your health care provider a list of all the medicines, herbs, non-prescription drugs, or dietary supplements you use. Also tell them if you smoke, drink alcohol, or use illegal drugs. Some items may interact with your medicine. What should I watch for while using this medication? This drug may make you feel generally unwell. This is not uncommon, as chemotherapy can affect healthy cells as well as cancer cells. Report any side effects. Continue your  course of treatment even though you feel ill unless your doctor tells you to stop. There is a maximum amount of this medicine you should receive throughout your life. The amount depends on the medical condition being treated and your overall health. Your doctor will watch how much of this medicine you receive in your lifetime. Tell your doctor if you have taken this medicine before. You may need blood work done while you are taking this medicine. Your urine may turn red for a few days after your dose. This is not blood. If your urine is dark or brown, call your doctor. In some cases, you may be given additional medicines to help with side effects. Follow all directions for their use. Call your doctor or health care professional for advice if you get a fever, chills or sore throat, or other symptoms of a cold  or flu. Do not treat yourself. This drug decreases your body's ability to fight infections. Try to avoid being around people who are sick. This medicine may increase your risk to bruise or bleed. Call your doctor or health care professional if you notice any unusual bleeding. Talk to your doctor about your risk of cancer. You may be more at risk for certain types of cancers if you take this medicine. Do not become pregnant while taking this medicine or for 6 months after stopping it. Women should inform their doctor if they wish to become pregnant or think they might be pregnant. Men should not father a child while taking this medicine and for 6 months after stopping it. There is a potential for serious side effects to an unborn child. Talk to your health care professional or pharmacist for more information. Do not breast-feed an infant while taking this medicine. This medicine has caused ovarian failure in some women and reduced sperm counts in some men This medicine may interfere with the ability to have a child. Talk with your doctor or health care professional if you are concerned about your  fertility. This medicine may cause a decrease in Co-Enzyme Q-10. You should make sure that you get enough Co-Enzyme Q-10 while you are taking this medicine. Discuss the foods you eat and the vitamins you take with your health care professional. What side effects may I notice from receiving this medication? Side effects that you should report to your doctor or health care professional as soon as possible: allergic reactions like skin rash, itching or hives, swelling of the face, lips, or tongue breathing problems chest pain fast or irregular heartbeat low blood counts - this medicine may decrease the number of white blood cells, red blood cells and platelets. You may be at increased risk for infections and bleeding. pain, redness, or irritation at site where injected signs of infection - fever or chills, cough, sore throat, pain or difficulty passing urine signs of decreased platelets or bleeding - bruising, pinpoint red spots on the skin, black, tarry stools, blood in the urine swelling of the ankles, feet, hands tiredness weakness Side effects that usually do not require medical attention (report to your doctor or health care professional if they continue or are bothersome): diarrhea hair loss mouth sores nail discoloration or damage nausea red colored urine vomiting This list may not describe all possible side effects. Call your doctor for medical advice about side effects. You may report side effects to FDA at 1-800-FDA-1088. Where should I keep my medication? This drug is given in a hospital or clinic and will not be stored at home. NOTE: This sheet is a summary. It may not cover all possible information. If you have questions about this medicine, talk to your doctor, pharmacist, or health care provider.  2022 Elsevier/Gold Standard (2017-08-10 00:00:00)

## 2021-10-29 LAB — URINE CULTURE: Culture: <10000 — AB

## 2021-11-15 ENCOUNTER — Other Ambulatory Visit: Payer: Self-pay

## 2021-11-15 ENCOUNTER — Ambulatory Visit
Admission: RE | Admit: 2021-11-15 | Discharge: 2021-11-15 | Disposition: A | Discharge status: Home / Self Care | Source: Ambulatory Visit | Attending: Oncology | Admitting: Oncology

## 2021-11-15 DIAGNOSIS — Z171 Estrogen receptor negative status [ER-]: Secondary | ICD-10-CM | POA: Diagnosis present

## 2021-11-15 DIAGNOSIS — C50412 Malignant neoplasm of upper-outer quadrant of left female breast: Secondary | ICD-10-CM | POA: Insufficient documentation

## 2021-11-15 IMAGING — MR MR BREAST BILAT WO/W CM
3 of 10 series · 12 of 48 positions shown · IV contrast (gadavist)
Comparison: Prior films

CLINICAL DATA: Follow-up known left breast cancer post neoadjuvant
therapy.

EXAM:
BILATERAL BREAST MRI WITH AND WITHOUT CONTRAST
TECHNIQUE: Multiplanar, multisequence MR images of both breasts were obtained
prior to and following the intravenous administration of 10 ml of
Gadavist

[Series 4: T1 · axial · B · 1.5mm · 1.02mm/px · z∈[-86,+93]mm · 6 of 120 slices shown]
[im 1/120]
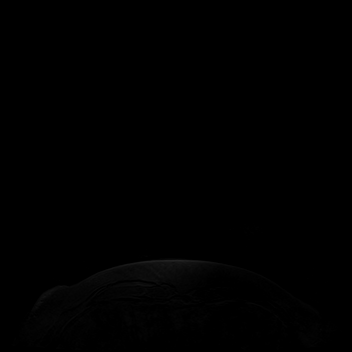
[im 24/120]
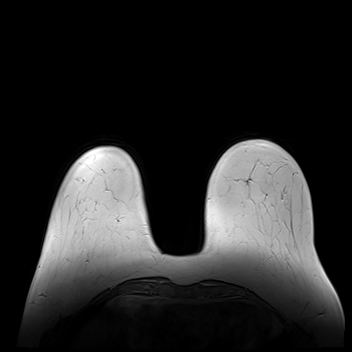
[im 48/120]
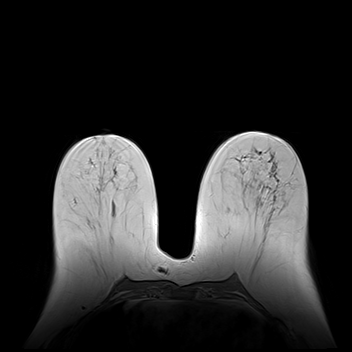
[im 72/120]
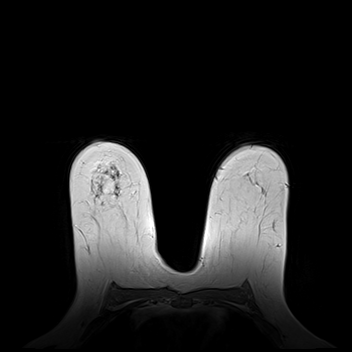
[im 96/120]
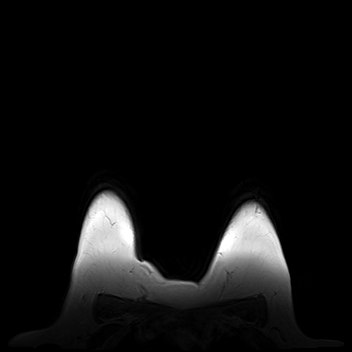
[im 120/120]
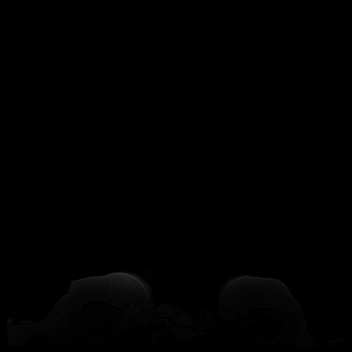

[Series 5: T2 · axial · B · 3.0mm · 1.02mm/px · z∈[-77,+85]mm · 3 of 46 slices shown (1 of 2)]
[im 1/46]
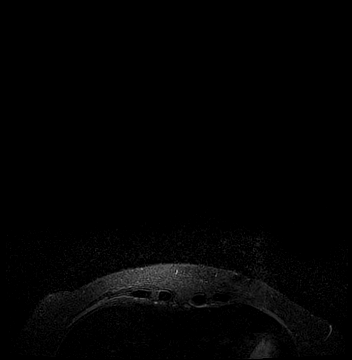
[im 23/46]
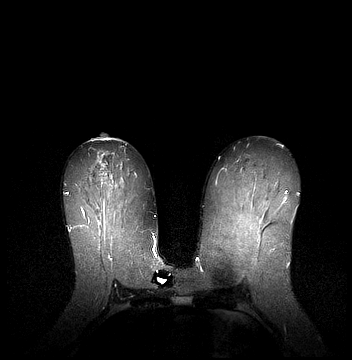
[im 46/46]
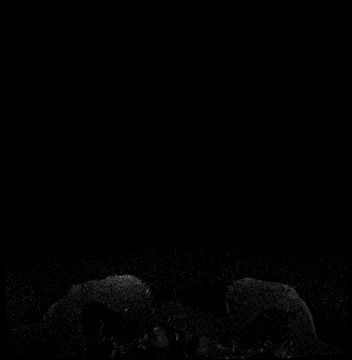

[Series 7: T2 · axial · B · 3.0mm · 1.02mm/px · z∈[-77,+85]mm · 3 of 46 slices shown (2 of 2)]
[im 1/46]
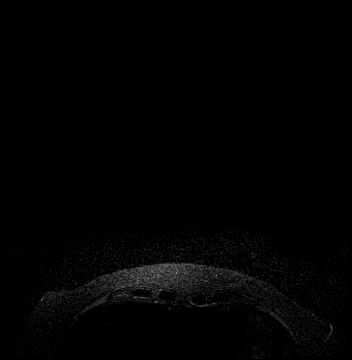
[im 23/46]
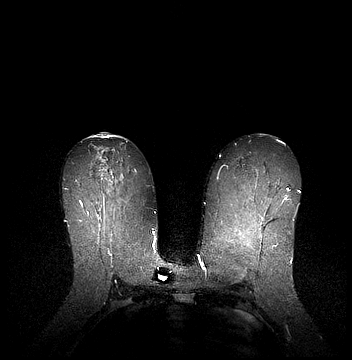
[im 46/46]
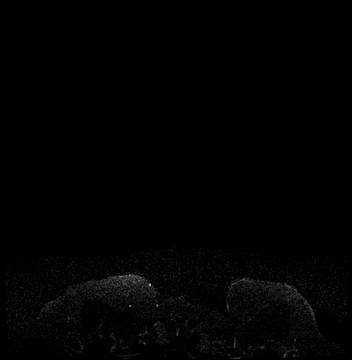

[12 of 48 positions shown; findings below may reference images not displayed]

Three-dimensional MR images were rendered by post-processing of the
original MR data on an independent workstation. The
three-dimensional MR images were interpreted, and findings are
reported in the following complete MRI report for this study. Three
dimensional images were evaluated at the independent interpreting
workstation using the DynaCAD thin client.
FINDINGS: Breast composition: b. Scattered fibroglandular tissue.

Background parenchymal enhancement: Minimal.

Right breast: No mass or abnormal enhancement.

Left breast: No mass or abnormal enhancement.

Lymph nodes: No abnormal appearing lymph nodes.

Ancillary findings:  None.
IMPRESSION: Known left breast cancer. No abnormal enhancement is identified in
bilateral breasts.

RECOMMENDATION:
Treatment plan.

BI-RADS CATEGORY  6: Known biopsy-proven malignancy.

## 2021-11-15 MED ORDER — GADOBUTROL 1 MMOL/ML IV SOLN
10.0000 mL | Freq: Once | INTRAVENOUS | Status: AC | PRN
  Administered 2021-11-15: 09:00:00 10 mL via INTRAVENOUS

## 2021-11-25 ENCOUNTER — Other Ambulatory Visit: Payer: Self-pay | Admitting: General Surgery

## 2021-11-25 DIAGNOSIS — C50412 Malignant neoplasm of upper-outer quadrant of left female breast: Secondary | ICD-10-CM

## 2021-11-25 NOTE — Progress Notes (Signed)
Subjective:     Patient ID: Peggy Bates is a 55 y.o. female.   HPI   The following portions of the patient's history were reviewed and updated as appropriate.   This an established patient is here today for: office visit.here to discuss breast surgery for left breast cancer. She completed chemotherapy on 10-28-21, little nausea toward the end of chemotherapy.   MRI was 11-15-21.   Review of Systems  Constitutional: Negative for chills and fever.  Respiratory: Negative for cough.          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   Goiter     Malignant neoplasm of left female breast (CMS-HCC) 04/07/2021    Clinical 2 cm, triple negative mass. Neo-adjuvant chemotherapy.           Past Surgical History:  Procedure Laterality Date   ARTHROSCOPY KNEE Right 2015   TOOTH EXTRACTION       TRANSCERVICAL UTERINE FIBROID(S) ABLATION   2010                OB History     Gravida  3   Para  3   Term      Preterm      AB      Living         SAB      IAB      Ectopic      Molar      Multiple      Live Births           Obstetric Comments  Age at first period 65 Age of first pregnancy 74             Social History          Socioeconomic History   Marital status: Married  Tobacco Use   Smoking status: Never   Smokeless tobacco: Never  Substance and Sexual Activity   Alcohol use: Never   Drug use: Never        No Known Allergies   Current Medications        Current Outpatient Medications  Medication Sig Dispense Refill   cetirizine (ZYRTEC) 10 MG tablet Take 10 mg by mouth once daily        diclofenac (VOLTAREN) 1 % topical gel as directed       gabapentin (NEURONTIN) 600 MG tablet once daily       meloxicam (MOBIC) 15 MG tablet Take 15 mg by mouth once daily with food       ondansetron (ZOFRAN) 8 MG tablet Take by mouth as needed       cholecalciferol (VITAMIN D3) 1000 unit tablet Take by mouth once daily       lidocaine-prilocaine (EMLA) cream Apply to areola one hour prior to arrival day of procedure. Cover with saran wrap. 5 g 0    No current facility-administered medications for this visit.  Family History  Problem Relation Age of Onset   Diabetes Mother     Diabetes Sister     Breast cancer Sister 70   Breast cancer Paternal Grandmother     Colon cancer Neg Hx          Labs and Radiology:    Imaging review:   The patient had multiple breast MRIs after her diagnosis and then through treatment.  Most recent study of November 15, 2021 showed no areas of increased activity.     Ultrasound:   Ultrasound examination of the upper outer quadrant of the left breast cyst cyst with breast conservation planning was completed.  There is focal thickening near the 2 o'clock position about 9 cm from the nipple.  In this area there is a irregular hypoechoic mass measuring 0.6 x 0.8 x 1.0 cm.  Clip not clearly evident within this tissue mass.   Scanning through axilla shows no discernible lymphadenopathy.  Previously placed Hydro mark clip no longer evident.   The patient had undergone an MRI guided biopsy of what was thought to be the posterior extent of disease in the upper outer quadrant left breast.  This showed high-grade DCIS.  While the clips are 3.2 cm apart by report, based on the air within the breast and the distortion adjacent, the entire area probably is truly 6 cm in overall diameter.              Objective:   Physical Exam Exam conducted with a chaperone present.  Constitutional:      Appearance: Normal appearance.  Cardiovascular:     Rate  and Rhythm: Normal rate and regular rhythm.     Pulses: Normal pulses.     Heart sounds: Normal heart sounds.  Pulmonary:     Effort: Pulmonary effort is normal.     Breath sounds: Normal breath sounds.  Musculoskeletal:     Cervical back: Neck supple.  Skin:    General: Skin is warm and dry.  Neurological:     Mental Status: She is alert and oriented to person, place, and time.  Psychiatric:        Mood and Affect: Mood normal.        Behavior: Behavior normal.           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    Plan:     Options for management reviewed: Breast conservation and mastectomy presented is therapeutically equivalent.  Patient desires breast conservation.  The role of postoperative radiation therapy with breast conservation was reviewed.   Based on today's clinical exam and ultrasound findings, as well as on review of her most recent imaging studies, wire localization of the 2 breast clips will be prudent.   As the left axillary lymph node suspicious to the radiology service was benign on biopsy, we will not request wire localization of this lesion.  She is a candidate for sentinel node exam however.   The patient did very well during her chemotherapy, reporting very few missed days of work.  She only developed nausea with the last series of treatments.   Plans for surgery is scheduled for December 10, 2021 with wide excision, likely tissue transfer and sentinel node biopsy.      This note is partially prepared by Karie Fetch, RN, acting as a scribe in the presence of Dr. Hervey Ard, MD.  The documentation recorded by the scribe accurately  reflects the service I personally performed and the decisions made by me.    Robert Bellow, MD FACS

## 2021-11-26 ENCOUNTER — Other Ambulatory Visit: Payer: Self-pay | Admitting: General Surgery

## 2021-11-26 DIAGNOSIS — C50412 Malignant neoplasm of upper-outer quadrant of left female breast: Secondary | ICD-10-CM

## 2021-11-26 DIAGNOSIS — Z171 Estrogen receptor negative status [ER-]: Secondary | ICD-10-CM

## 2021-12-01 ENCOUNTER — Telehealth: Payer: Self-pay

## 2021-12-01 NOTE — Telephone Encounter (Signed)
Please schedule MD only, 2 weeks after surgery. Please notify pt of appts.

## 2021-12-01 NOTE — Telephone Encounter (Signed)
-----   Message from Theodore Demark, RN sent at 11/30/2021 11:58 AM EST ----- Regarding: Post-op appointment Hi [name]!   [name] called to say her surgery is scheduled for 12/09/21.  She said Dr. Tasia Catchings wanted to see her post op.   Thank you! Lajean Silvius. Who is your scheduler?

## 2021-12-02 ENCOUNTER — Other Ambulatory Visit: Payer: Self-pay

## 2021-12-02 ENCOUNTER — Other Ambulatory Visit
Admission: RE | Admit: 2021-12-02 | Discharge: 2021-12-02 | Disposition: A | Discharge status: Home / Self Care | Source: Ambulatory Visit | Attending: General Surgery | Admitting: General Surgery

## 2021-12-02 NOTE — Patient Instructions (Addendum)
Your procedure is scheduled on: Friday December 10, 2021. Report to [facility] as instructed by your doctor's office.  Remember: Instructions that are not followed completely may result in serious medical risk,  up to and including death, or upon the discretion of your surgeon and anesthesiologist your  surgery may need to be rescheduled.     _X__ 1. Do not eat food after midnight the night before your procedure.                 No chewing gum or hard candies. You may drink clear liquids up to 2 hours                 before you are scheduled to arrive for your surgery- DO not drink clear                 liquids within 2 hours of the start of your surgery.                 Clear Liquids include:  water, apple juice without pulp, clear Gatorade, G2 or                  Gatorade Zero (avoid Red/Purple/Blue), Black Coffee or Tea (Do not add                 anything to coffee or tea).  __X__2.  On the morning of surgery brush your teeth with toothpaste and water, you                may rinse your mouth with mouthwash if you wish.  Do not swallow any toothpaste or mouthwash.     _X__ 3.  No Alcohol for 24 hours before or after surgery.   _X__ 4.  Do Not Smoke or use e-cigarettes For 24 Hours Prior to Your Surgery.                 Do not use any chewable tobacco products for at least 6 hours prior to                 Surgery.  _X__  5.  Do not use any recreational drugs (marijuana, cocaine, heroin, ecstasy, MDMA or other)                For at least one week prior to your surgery.  Combination of these drugs with anesthesia                May have life threatening results.  __X__6.  Notify your doctor if there is any change in your medical condition      (cold, fever, infections).     Do not wear jewelry, make-up, hairpins, clips or nail polish. Do not wear lotions, powders, perfumes or deodorant. Do not shave 48 hours prior to surgery.  Do not bring  valuables to the hospital.    [facility] is not responsible for any belongings or valuables.  Contacts, dentures or bridgework may not be worn into surgery. Leave your suitcase in the car. After surgery it may be brought to your room. For patients admitted to the hospital, discharge time is determined by your treatment team.   Patients discharged the day of surgery will not be allowed to drive home.   Make arrangements for someone to be with you for the first 24 hours of your Same Day Discharge.   __X__ Take these medicines the morning of surgery with A SIP OF  WATER:    1. None   2.   3.   4.  5.  6.  ____ Fleet Enema (as directed)   __X__ Use Antibacterial Soap (or wipes) as directed  ____ Use Benzoyl Peroxide Gel as instructed  ____ Use inhalers on the day of surgery  ____ Stop metformin 2 days prior to surgery    ____ Take 1/2 of usual insulin dose the night before surgery. No insulin the morning          of surgery.   ____ Call your PCP, cardiologist, or Pulmonologist if taking Coumadin/Plavix/aspirin and ask when to stop before your surgery.   __X__ One Week prior to surgery- Stop Anti-inflammatories such as Ibuprofen, Aleve, Advil, Motrin, meloxicam (MOBIC), diclofenac, etodolac, ketorolac, Toradol, Daypro, piroxicam, Goody's or BC powders. OK TO USE TYLENOL IF NEEDED   __X__  Do not start any new vitamins and or supplements until after surgery.    ____ Bring C-Pap to the hospital.    If you have any questions regarding your pre-procedure instructions,  Please call Pre-admit Testing at [phone]

## 2021-12-09 MED ORDER — FAMOTIDINE 20 MG PO TABS: 20.0000 mg | ORAL_TABLET | Freq: Once | ORAL | Status: AC

## 2021-12-09 MED ORDER — CHLORHEXIDINE GLUCONATE 0.12 % MT SOLN: 15.0000 mL | Freq: Once | OROMUCOSAL | Status: AC

## 2021-12-09 MED ORDER — LACTATED RINGERS IV SOLN: INTRAVENOUS | Status: DC

## 2021-12-09 MED ORDER — CHLORHEXIDINE GLUCONATE CLOTH 2 % EX PADS: 6.0000 | MEDICATED_PAD | Freq: Once | CUTANEOUS | Status: DC

## 2021-12-09 MED ORDER — ORAL CARE MOUTH RINSE: 15.0000 mL | Freq: Once | OROMUCOSAL | Status: AC

## 2021-12-10 ENCOUNTER — Ambulatory Visit
Admission: RE | Admit: 2021-12-10 | Discharge: 2021-12-10 | Disposition: A | Discharge status: Home / Self Care | Source: Ambulatory Visit | Attending: General Surgery | Admitting: General Surgery

## 2021-12-10 ENCOUNTER — Ambulatory Visit: Admitting: Certified Registered"

## 2021-12-10 ENCOUNTER — Encounter
Admission: RE | Disposition: A | Discharge status: Home / Self Care | Payer: Self-pay | Source: Home / Self Care | Attending: General Surgery

## 2021-12-10 ENCOUNTER — Ambulatory Visit
Admission: RE | Admit: 2021-12-10 | Discharge: 2021-12-10 | Disposition: A | Discharge status: Home / Self Care | Attending: General Surgery | Admitting: General Surgery

## 2021-12-10 ENCOUNTER — Other Ambulatory Visit: Payer: Self-pay

## 2021-12-10 ENCOUNTER — Encounter: Payer: Self-pay | Admitting: General Surgery

## 2021-12-10 DIAGNOSIS — Z6837 Body mass index (BMI) 37.0-37.9, adult: Secondary | ICD-10-CM | POA: Diagnosis not present

## 2021-12-10 DIAGNOSIS — C50412 Malignant neoplasm of upper-outer quadrant of left female breast: Secondary | ICD-10-CM

## 2021-12-10 DIAGNOSIS — Z9221 Personal history of antineoplastic chemotherapy: Secondary | ICD-10-CM | POA: Diagnosis not present

## 2021-12-10 DIAGNOSIS — Z79899 Other long term (current) drug therapy: Secondary | ICD-10-CM | POA: Insufficient documentation

## 2021-12-10 DIAGNOSIS — Z171 Estrogen receptor negative status [ER-]: Secondary | ICD-10-CM

## 2021-12-10 DIAGNOSIS — C50912 Malignant neoplasm of unspecified site of left female breast: Secondary | ICD-10-CM | POA: Insufficient documentation

## 2021-12-10 HISTORY — PX: ADJACENT TISSUE TRANSFER/TISSUE REARRANGEMENT: SHX6829

## 2021-12-10 HISTORY — PX: BREAST LUMPECTOMY WITH NEEDLE LOCALIZATION: SHX5759

## 2021-12-10 HISTORY — PX: AXILLARY SENTINEL NODE BIOPSY: SHX5738

## 2021-12-10 LAB — POCT PREGNANCY, URINE: Preg Test, Ur: NEGATIVE

## 2021-12-10 IMAGING — MG MM BREAST SURGICAL SPECIMEN
2 series · 2 of 2 positions shown · non-contrast
Comparison: Previous exam(s).

CLINICAL DATA: 55-year-old female status post bracketed left breast
lumpectomy.

EXAM:
SPECIMEN RADIOGRAPH OF THE LEFT BREAST

[L (1 of 2)]
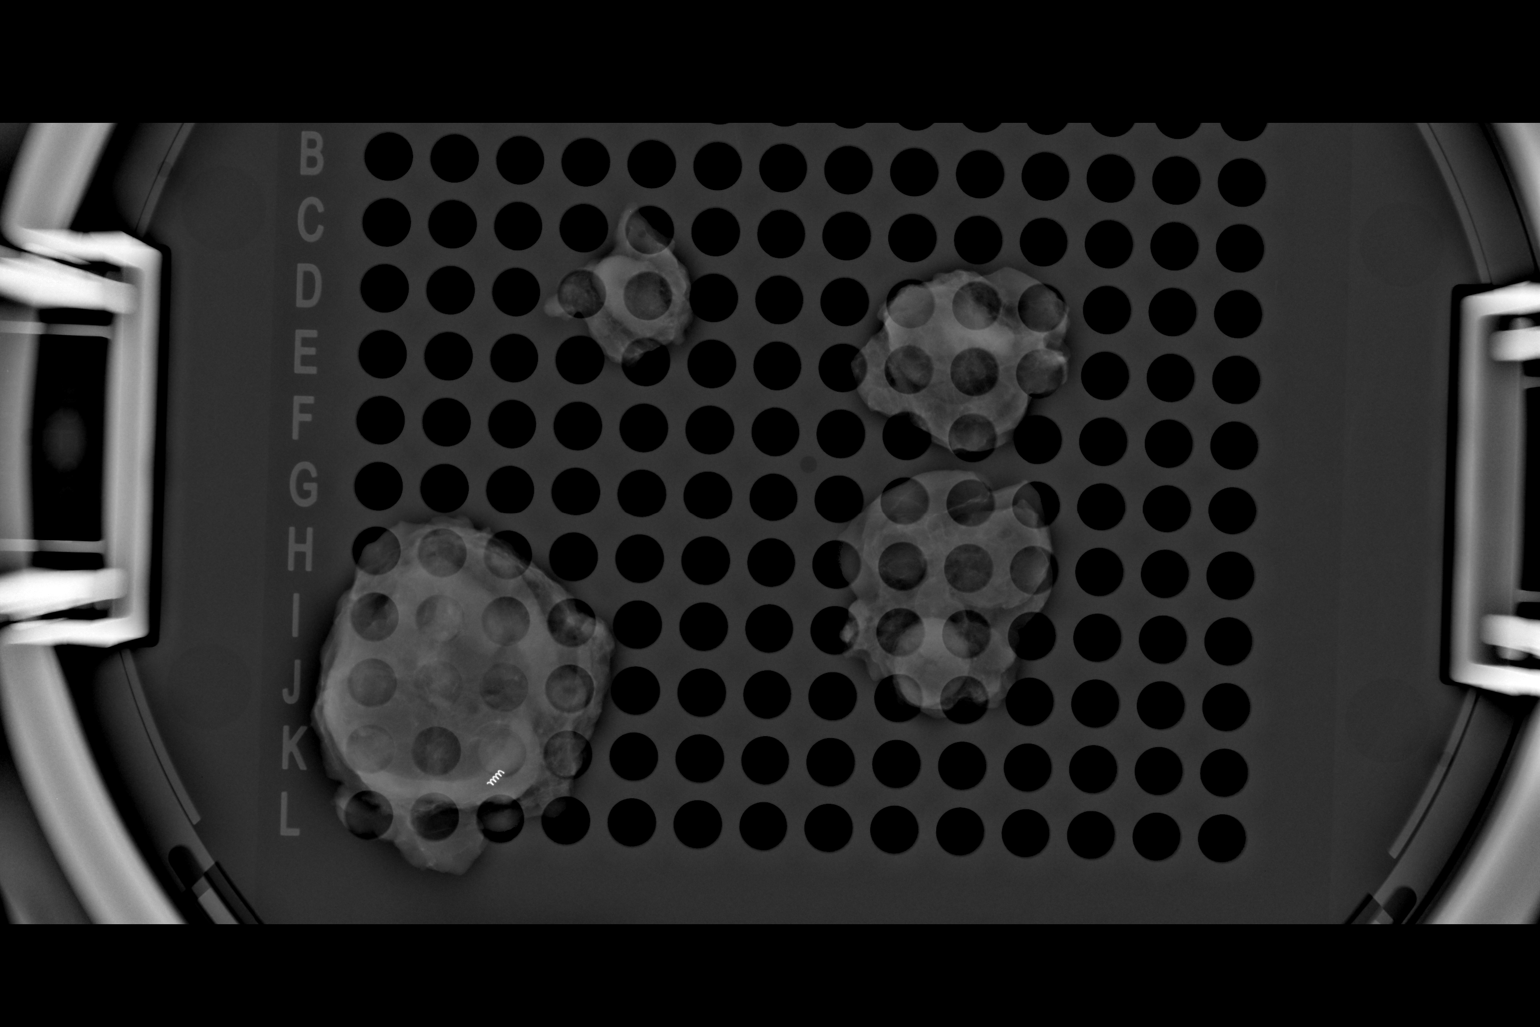

[L (2 of 2)]
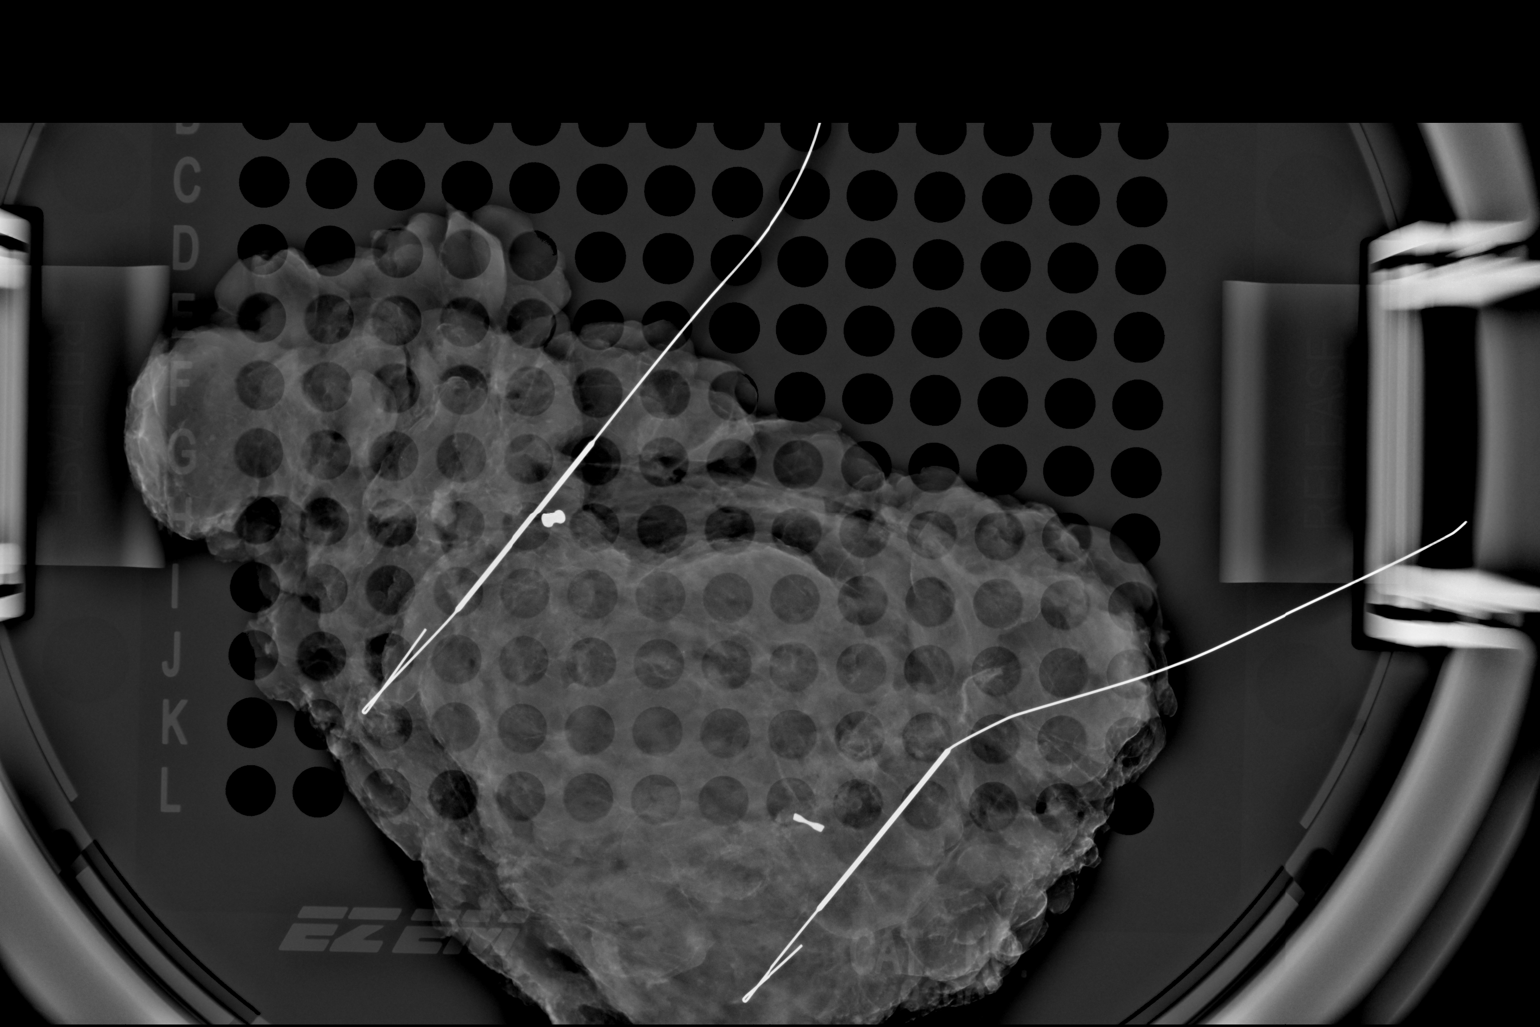

[2 of 2 positions shown; findings below may reference images not displayed]

FINDINGS: Status post excision of the left breast. The wire tips and biopsy
marker clips are present and are marked for pathology.
IMPRESSION: Specimen radiograph of the left breast.

## 2021-12-10 IMAGING — MG MM PLC BREAST LOC DEV 1ST LESION INC MAMMO GUIDE*L*
9 of 17 series · 9 of 25 positions shown · non-contrast
Comparison: Previous exams.

CLINICAL DATA: 55-year-old female with 2 sites of biopsy proven
left breast cancer.

EXAM:
NEEDLE LOCALIZATION OF THE LEFT BREAST WITH MAMMO GUIDANCE

[L LM (1 of 8)]
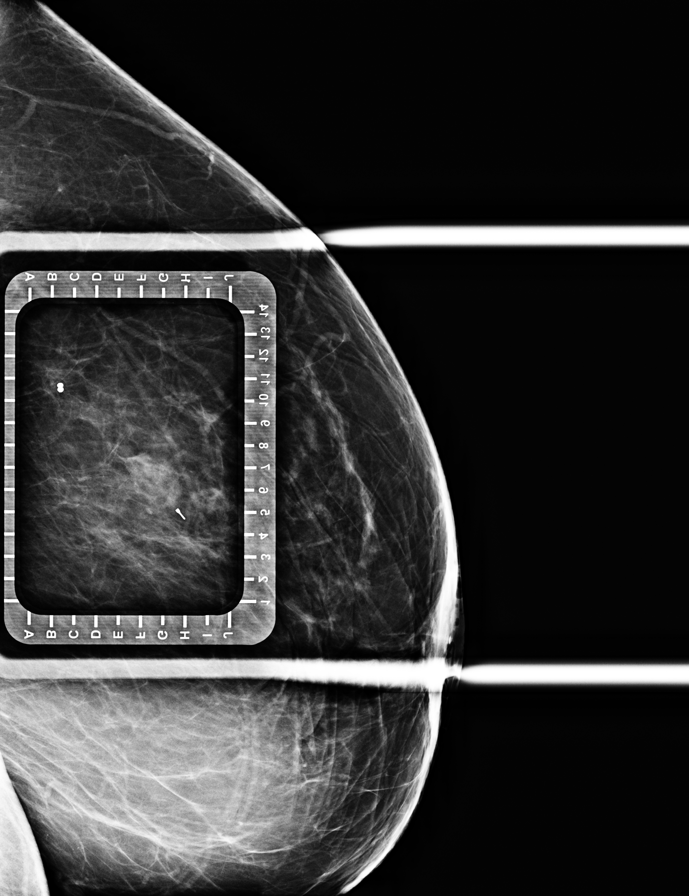

[L LM (2 of 8)]
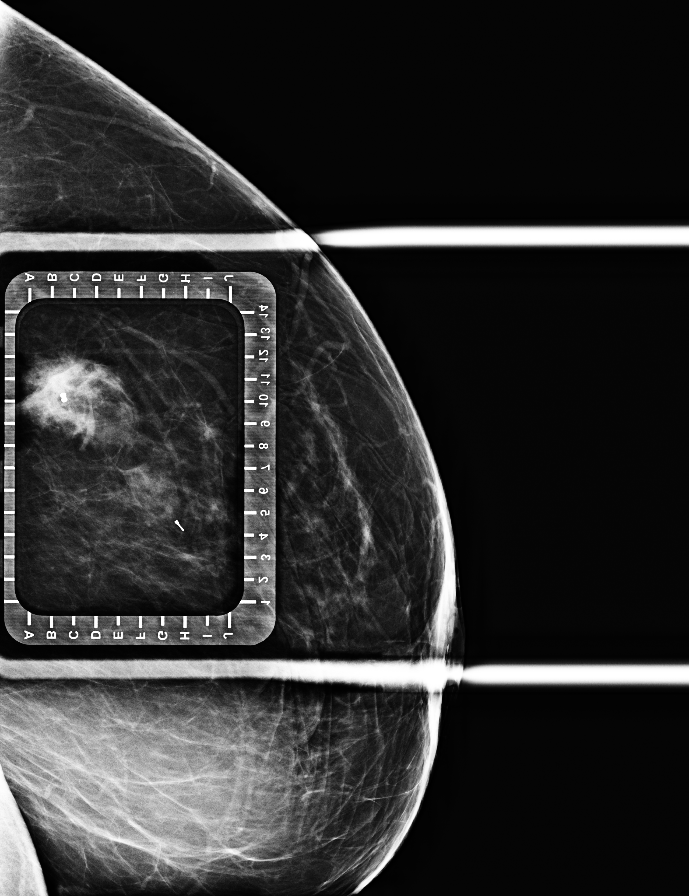

[L LM (3 of 8)]
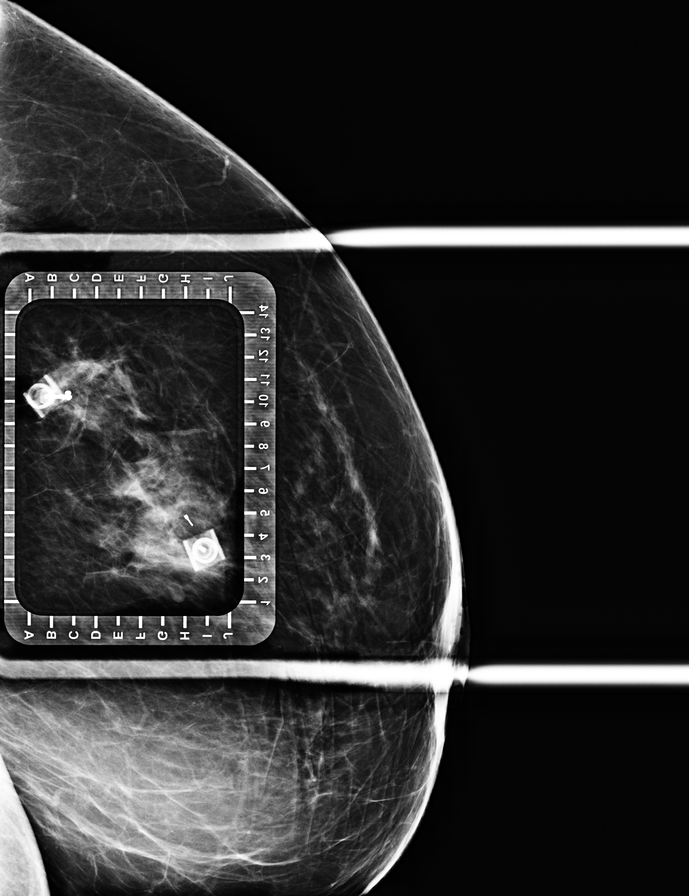

[L CC]
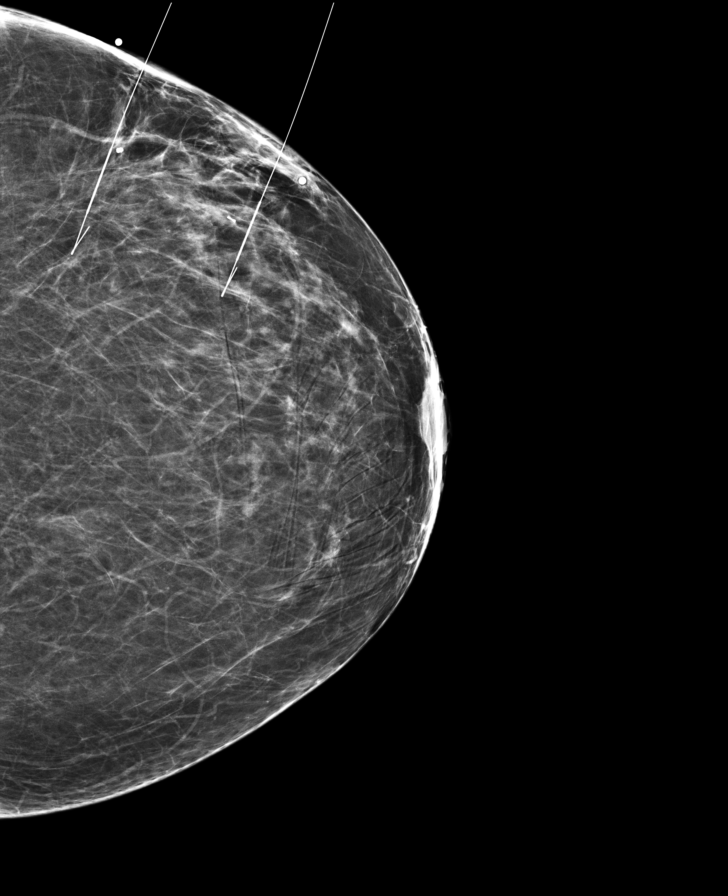

[L LM (4 of 8)]
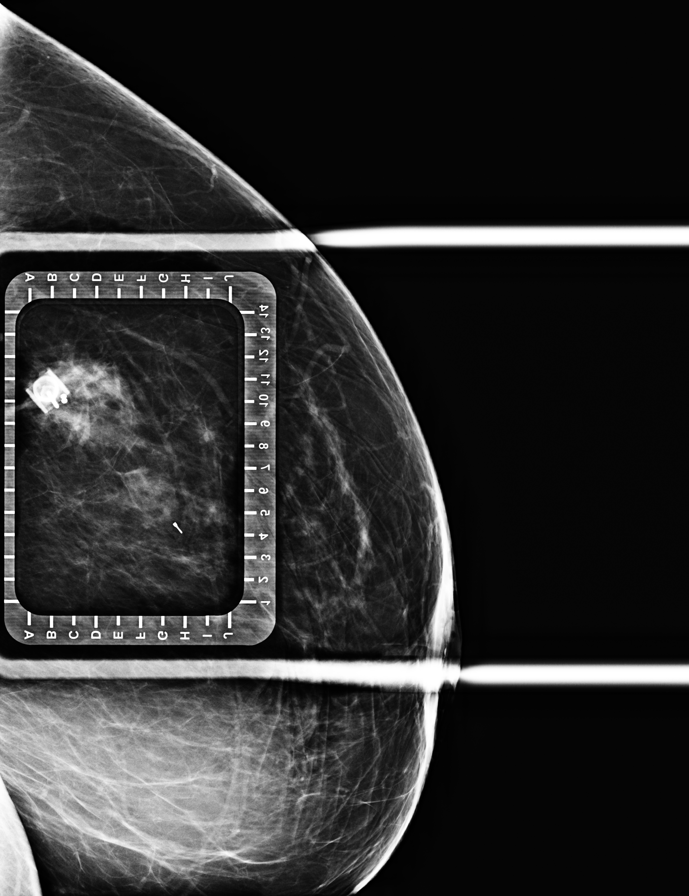

[L LM (5 of 8)]
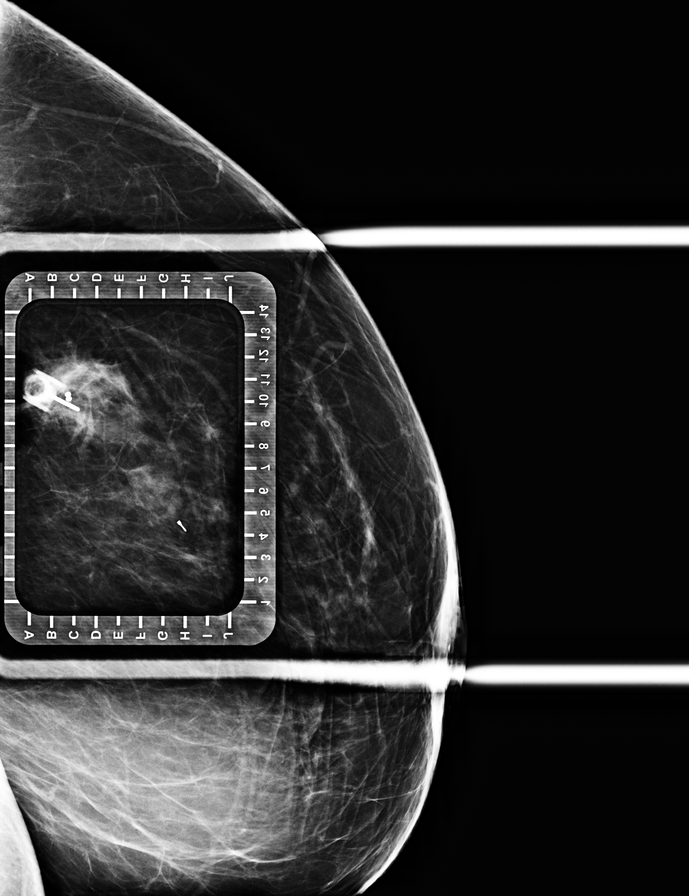

[L LM (6 of 8)]
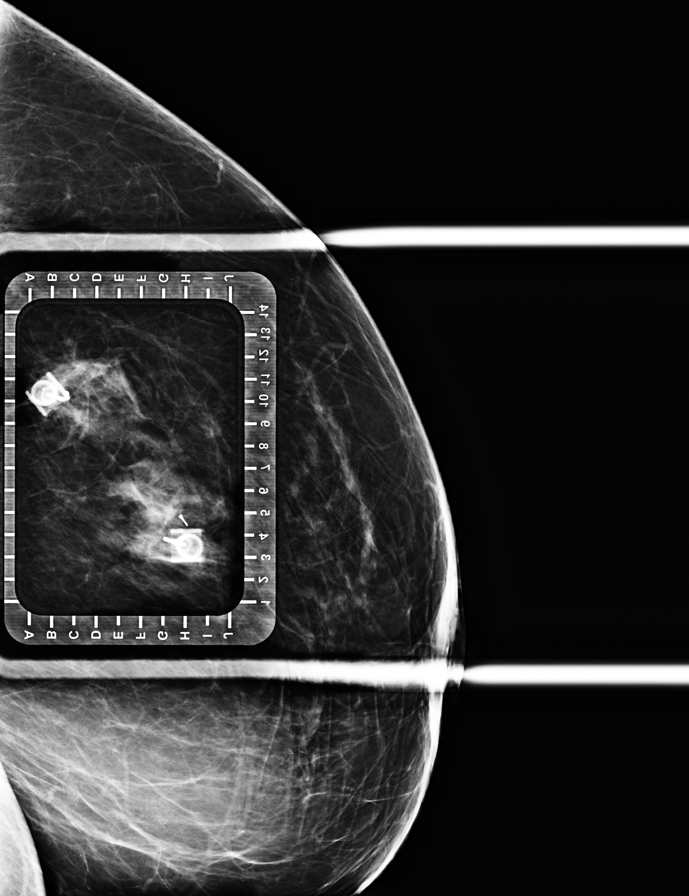

[L LM (7 of 8)]
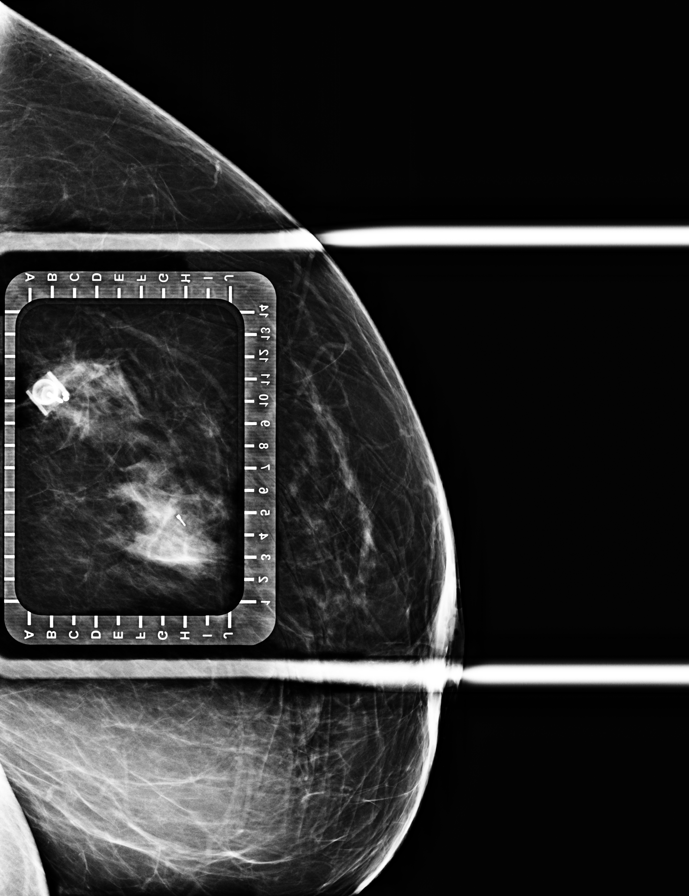

[L LM (8 of 8)]
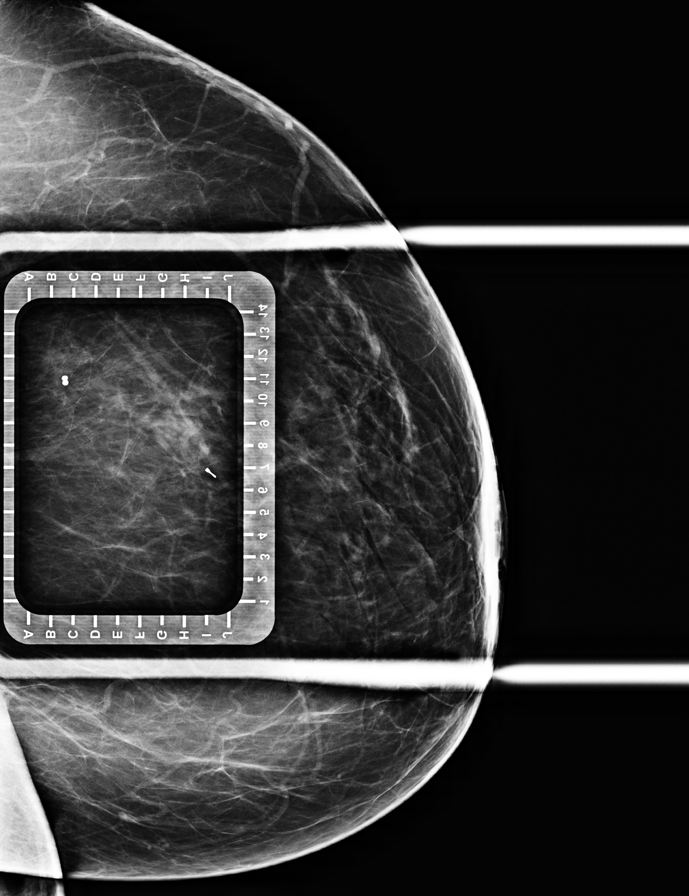

[9 of 25 positions shown; findings below may reference images not displayed]

PROCEDURE:
Patient presents for needle localization prior to surgical excision
of the left breast. I met with the patient and we discussed the
procedure of needle localization including benefits and
alternatives. We discussed the high likelihood of a successful
procedure. We discussed the risks of the procedure, including
infection, bleeding, tissue injury, and further surgery. Informed,
written consent was given. The usual time-out protocol was performed
immediately prior to the procedure.

Using mammographic guidance, sterile technique, 1% lidocaine and a 5
cm modified Kopans needle, both the anterior and posterior post
biopsy clips in the lateral left breast were localized using lateral
approach. The images were marked for Dr. MULDI.
IMPRESSION: Bracketed needle localization of the left breast. No apparent
complications.

## 2021-12-10 SURGERY — BREAST LUMPECTOMY WITH NEEDLE LOCALIZATION
Anesthesia: General | Site: Breast | Laterality: Left

## 2021-12-10 MED ORDER — STERILE WATER FOR IRRIGATION IR SOLN
Status: DC | PRN
  Administered 2021-12-10: 1000 mL

## 2021-12-10 MED ORDER — SODIUM CHLORIDE 0.9 % IV SOLN
12.5000 mg | Freq: Once | INTRAVENOUS | Status: DC
  Filled 2021-12-10: qty 0.5

## 2021-12-10 MED ORDER — KETOROLAC TROMETHAMINE 30 MG/ML IJ SOLN
INTRAMUSCULAR | Status: DC | PRN
Start: 2021-12-10 — End: 2021-12-10
  Administered 2021-12-10: 30 mg via INTRAVENOUS

## 2021-12-10 MED ORDER — TECHNETIUM TC 99M TILMANOCEPT KIT
1.0000 | PACK | Freq: Once | INTRAVENOUS | Status: AC
  Administered 2021-12-10: 1.15 via INTRADERMAL

## 2021-12-10 MED ORDER — CHLORHEXIDINE GLUCONATE 0.12 % MT SOLN
OROMUCOSAL | Status: AC
  Administered 2021-12-10: 10:00:00 15 mL via OROMUCOSAL
  Filled 2021-12-10: qty 15

## 2021-12-10 MED ORDER — ACETAMINOPHEN 10 MG/ML IV SOLN
INTRAVENOUS | Status: AC
  Filled 2021-12-10: qty 100

## 2021-12-10 MED ORDER — DEXMEDETOMIDINE (PRECEDEX) IN NS 20 MCG/5ML (4 MCG/ML) IV SYRINGE
PREFILLED_SYRINGE | INTRAVENOUS | Status: DC | PRN
  Administered 2021-12-10: 8 ug via INTRAVENOUS
  Administered 2021-12-10: 12 ug via INTRAVENOUS

## 2021-12-10 MED ORDER — SODIUM CHLORIDE FLUSH 0.9 % IV SOLN
INTRAVENOUS | Status: AC
  Administered 2021-12-10: 13:00:00 10 mL
  Filled 2021-12-10: qty 10

## 2021-12-10 MED ORDER — KETOROLAC TROMETHAMINE 30 MG/ML IJ SOLN
INTRAMUSCULAR | Status: AC
  Filled 2021-12-10: qty 1

## 2021-12-10 MED ORDER — BUPIVACAINE-EPINEPHRINE (PF) 0.5% -1:200000 IJ SOLN
INTRAMUSCULAR | Status: AC
  Filled 2021-12-10: qty 30

## 2021-12-10 MED ORDER — PROPOFOL 1000 MG/100ML IV EMUL
INTRAVENOUS | Status: AC
  Filled 2021-12-10: qty 100

## 2021-12-10 MED ORDER — HYDROCODONE-ACETAMINOPHEN 5-325 MG PO TABS: 1.0000 | ORAL_TABLET | ORAL | 0 refills | Status: DC | PRN

## 2021-12-10 MED ORDER — FENTANYL CITRATE (PF) 100 MCG/2ML IJ SOLN
INTRAMUSCULAR | Status: DC | PRN
  Administered 2021-12-10: 50 ug via INTRAVENOUS
  Administered 2021-12-10 (×2): 25 ug via INTRAVENOUS
  Administered 2021-12-10 (×2): 50 ug via INTRAVENOUS

## 2021-12-10 MED ORDER — METHYLENE BLUE 0.5 % INJ SOLN
INTRAVENOUS | Status: DC | PRN
  Administered 2021-12-10: 10 mL via SUBMUCOSAL

## 2021-12-10 MED ORDER — GLYCOPYRROLATE 0.2 MG/ML IJ SOLN
INTRAMUSCULAR | Status: DC | PRN
  Administered 2021-12-10: .2 mg via INTRAVENOUS

## 2021-12-10 MED ORDER — ONDANSETRON HCL 4 MG/2ML IJ SOLN
INTRAMUSCULAR | Status: DC | PRN
  Administered 2021-12-10: 4 mg via INTRAVENOUS

## 2021-12-10 MED ORDER — PHENYLEPHRINE 40 MCG/ML (10ML) SYRINGE FOR IV PUSH (FOR BLOOD PRESSURE SUPPORT)
PREFILLED_SYRINGE | INTRAVENOUS | Status: DC | PRN
  Administered 2021-12-10 (×6): 100 ug via INTRAVENOUS

## 2021-12-10 MED ORDER — PROMETHAZINE HCL 25 MG/ML IJ SOLN
INTRAMUSCULAR | Status: AC
  Administered 2021-12-10: 13:00:00 12.5 mg
  Filled 2021-12-10: qty 1

## 2021-12-10 MED ORDER — LIDOCAINE HCL (CARDIAC) PF 100 MG/5ML IV SOSY
PREFILLED_SYRINGE | INTRAVENOUS | Status: DC | PRN
  Administered 2021-12-10: 100 mg via INTRAVENOUS

## 2021-12-10 MED ORDER — ONDANSETRON HCL 4 MG/2ML IJ SOLN: 4.0000 mg | Freq: Once | INTRAMUSCULAR | Status: DC | PRN

## 2021-12-10 MED ORDER — PHENYLEPHRINE HCL (PRESSORS) 10 MG/ML IV SOLN: INTRAVENOUS | Status: DC | PRN

## 2021-12-10 MED ORDER — FAMOTIDINE 20 MG PO TABS
ORAL_TABLET | ORAL | Status: AC
  Administered 2021-12-10: 10:00:00 20 mg via ORAL
  Filled 2021-12-10: qty 1

## 2021-12-10 MED ORDER — MIDAZOLAM HCL 2 MG/2ML IJ SOLN
INTRAMUSCULAR | Status: AC
  Filled 2021-12-10: qty 2

## 2021-12-10 MED ORDER — FENTANYL CITRATE (PF) 100 MCG/2ML IJ SOLN
INTRAMUSCULAR | Status: AC
  Filled 2021-12-10: qty 2

## 2021-12-10 MED ORDER — MIDAZOLAM HCL 2 MG/2ML IJ SOLN
INTRAMUSCULAR | Status: DC | PRN
  Administered 2021-12-10: 2 mg via INTRAVENOUS

## 2021-12-10 MED ORDER — DEXAMETHASONE SODIUM PHOSPHATE 10 MG/ML IJ SOLN
INTRAMUSCULAR | Status: DC | PRN
  Administered 2021-12-10: 8 mg via INTRAVENOUS

## 2021-12-10 MED ORDER — ACETAMINOPHEN 10 MG/ML IV SOLN
INTRAVENOUS | Status: DC | PRN
  Administered 2021-12-10: 1000 mg via INTRAVENOUS

## 2021-12-10 MED ORDER — PHENYLEPHRINE HCL (PRESSORS) 10 MG/ML IV SOLN
INTRAVENOUS | Status: AC
  Filled 2021-12-10: qty 1

## 2021-12-10 MED ORDER — METHYLENE BLUE 0.5 % INJ SOLN
INTRAVENOUS | Status: AC
  Filled 2021-12-10: qty 10

## 2021-12-10 MED ORDER — FENTANYL CITRATE (PF) 100 MCG/2ML IJ SOLN
INTRAMUSCULAR | Status: AC
  Administered 2021-12-10: 14:00:00 25 ug via INTRAVENOUS
  Filled 2021-12-10: qty 2

## 2021-12-10 MED ORDER — BUPIVACAINE-EPINEPHRINE (PF) 0.5% -1:200000 IJ SOLN
INTRAMUSCULAR | Status: DC | PRN
  Administered 2021-12-10: 30 mL

## 2021-12-10 MED ORDER — PROPOFOL 10 MG/ML IV BOLUS
INTRAVENOUS | Status: DC | PRN
  Administered 2021-12-10: 150 mg via INTRAVENOUS
  Administered 2021-12-10: 50 mg via INTRAVENOUS

## 2021-12-10 MED ORDER — PROMETHAZINE HCL 25 MG/ML IJ SOLN: 12.5000 mg | Freq: Four times a day (QID) | INTRAMUSCULAR | Status: DC | PRN

## 2021-12-10 MED ORDER — FENTANYL CITRATE (PF) 100 MCG/2ML IJ SOLN
25.0000 ug | INTRAMUSCULAR | Status: DC | PRN
  Administered 2021-12-10: 14:00:00 25 ug via INTRAVENOUS

## 2021-12-10 MED ORDER — SEVOFLURANE IN SOLN
RESPIRATORY_TRACT | Status: AC
  Filled 2021-12-10: qty 250

## 2021-12-10 SURGICAL SUPPLY — 64 items
BINDER BREAST XLRG (GAUZE/BANDAGES/DRESSINGS) ×2 IMPLANT
BINDER BREAST XXLRG (GAUZE/BANDAGES/DRESSINGS) ×2 IMPLANT
BLADE BOVIE TIP EXT 4 (BLADE) IMPLANT
BLADE SURG 15 STRL SS SAFETY (BLADE) ×8 IMPLANT
BULB RESERV EVAC DRAIN JP 100C (MISCELLANEOUS) IMPLANT
CHLORAPREP W/TINT 26 (MISCELLANEOUS) ×4 IMPLANT
CLOSURE WOUND 1/2 X4 (GAUZE/BANDAGES/DRESSINGS) ×1
CNTNR SPEC 2.5X3XGRAD LEK (MISCELLANEOUS)
CONT SPEC 4OZ STER OR WHT (MISCELLANEOUS)
CONTAINER SPEC 2.5X3XGRAD LEK (MISCELLANEOUS) IMPLANT
COVER PROBE FLX POLY STRL (MISCELLANEOUS) ×4 IMPLANT
DEVICE DUBIN SPECIMEN MAMMOGRA (MISCELLANEOUS) ×6 IMPLANT
DRAIN CHANNEL JP 15F RND 16 (MISCELLANEOUS) IMPLANT
DRAPE LAPAROTOMY TRNSV 106X77 (MISCELLANEOUS) ×4 IMPLANT
DRSG GAUZE FLUFF 36X18 (GAUZE/BANDAGES/DRESSINGS) ×8 IMPLANT
DRSG TELFA 3X8 NADH (GAUZE/BANDAGES/DRESSINGS) ×4 IMPLANT
ELECT CAUTERY BLADE TIP 2.5 (TIP) ×4
ELECT REM PT RETURN 9FT ADLT (ELECTROSURGICAL) ×4
ELECTRODE CAUTERY BLDE TIP 2.5 (TIP) ×2 IMPLANT
ELECTRODE REM PT RTRN 9FT ADLT (ELECTROSURGICAL) ×2 IMPLANT
GAUZE 4X4 16PLY ~~LOC~~+RFID DBL (SPONGE) ×4 IMPLANT
GLOVE SURG ENC MOIS LTX SZ7.5 (GLOVE) ×4 IMPLANT
GLOVE SURG UNDER LTX SZ8 (GLOVE) ×4 IMPLANT
GOWN STRL REUS W/ TWL LRG LVL3 (GOWN DISPOSABLE) ×4 IMPLANT
GOWN STRL REUS W/TWL LRG LVL3 (GOWN DISPOSABLE) ×4
KIT MARKER MARGIN INK (KITS) ×2 IMPLANT
KIT TURNOVER KIT A (KITS) ×4 IMPLANT
LABEL OR SOLS (LABEL) ×4 IMPLANT
MANIFOLD NEPTUNE II (INSTRUMENTS) ×4 IMPLANT
MARGIN MAP 10MM (MISCELLANEOUS) ×4 IMPLANT
NDL HYPO 25X1 1.5 SAFETY (NEEDLE) ×4 IMPLANT
NDL SPNL 20GX3.5 QUINCKE YW (NEEDLE) IMPLANT
NEEDLE HYPO 22GX1.5 SAFETY (NEEDLE) ×4 IMPLANT
NEEDLE HYPO 25X1 1.5 SAFETY (NEEDLE) ×8 IMPLANT
NEEDLE SPNL 20GX3.5 QUINCKE YW (NEEDLE) IMPLANT
PACK BASIN MINOR ARMC (MISCELLANEOUS) ×4 IMPLANT
PAD DRESSING TELFA 3X8 NADH (GAUZE/BANDAGES/DRESSINGS) ×2 IMPLANT
PENCIL ELECTRO HAND CTR (MISCELLANEOUS) ×4 IMPLANT
RETRACTOR RING XSMALL (MISCELLANEOUS) IMPLANT
RTRCTR WOUND ALEXIS 13CM XS SH (MISCELLANEOUS) ×4
SHEARS FOC LG CVD HARMONIC 17C (MISCELLANEOUS) IMPLANT
SHEARS HARMONIC 9CM CVD (BLADE) IMPLANT
SLEVE PROBE SENORX GAMMA FIND (MISCELLANEOUS) IMPLANT
STRIP CLOSURE SKIN 1/2X4 (GAUZE/BANDAGES/DRESSINGS) ×3 IMPLANT
SUT ETHILON 3-0 FS-10 30 BLK (SUTURE)
SUT SILK 2 0 (SUTURE) ×2
SUT SILK 2-0 18XBRD TIE 12 (SUTURE) ×2 IMPLANT
SUT VIC AB 2-0 CT1 (SUTURE) ×2 IMPLANT
SUT VIC AB 2-0 CT1 27 (SUTURE) ×2
SUT VIC AB 2-0 CT1 TAPERPNT 27 (SUTURE) ×4 IMPLANT
SUT VIC AB 3-0 54X BRD REEL (SUTURE) IMPLANT
SUT VIC AB 3-0 BRD 54 (SUTURE) ×2
SUT VIC AB 3-0 SH 27 (SUTURE) ×2
SUT VIC AB 3-0 SH 27X BRD (SUTURE) ×4 IMPLANT
SUT VIC AB 4-0 FS2 27 (SUTURE) ×8 IMPLANT
SUT VICRYL+ 3-0 144IN (SUTURE) ×2 IMPLANT
SUTURE EHLN 3-0 FS-10 30 BLK (SUTURE) ×2 IMPLANT
SWABSTK COMLB BENZOIN TINCTURE (MISCELLANEOUS) ×4 IMPLANT
SYR 10ML LL (SYRINGE) ×4 IMPLANT
SYR BULB IRRIG 60ML STRL (SYRINGE) ×4 IMPLANT
TAPE TRANSPORE STRL 2 31045 (GAUZE/BANDAGES/DRESSINGS) ×4 IMPLANT
TRAP NEPTUNE SPECIMEN COLLECT (MISCELLANEOUS) ×4 IMPLANT
WATER STERILE IRR 1000ML POUR (IV SOLUTION) ×4 IMPLANT
WATER STERILE IRR 500ML POUR (IV SOLUTION) ×4 IMPLANT

## 2021-12-10 NOTE — H&P (Signed)
Naiah Donahoe [id number] [birth year]     HPI:  55 y/o s/p neo-adjuvant chemotherapy. Excellent response. For wide excision and SLN biopsy.   Medications Prior to Admission  Medication Sig Dispense Refill Last Dose   acetaminophen (TYLENOL) 650 MG CR tablet Take 1,300 mg by mouth See admin instructions. Take 1300 mg in the morning may take a second 1300 mg dose during the day as needed for pain   12/10/2021 at 0545   chlorhexidine (PERIDEX) 0.12 % solution Use as directed 10 mLs in the mouth or throat 2 (two) times daily. (Patient taking differently: Use as directed 10 mLs in the mouth or throat daily.) 473 mL 2 Past Week   cholecalciferol (VITAMIN D) 25 MCG (1000 UNIT) tablet Take 1,000 Units by mouth daily.   12/10/2021 at 0545   diclofenac Sodium (VOLTAREN) 1 % GEL Apply 1 application topically daily.   12/10/2021 at 0545   gabapentin (NEURONTIN) 600 MG tablet Take 600 mg by mouth every morning.   12/10/2021 at 0545   Glucosamine-Chondroitin (MOVE FREE PO) Take 1 tablet by mouth daily.   Past Week   meloxicam (MOBIC) 15 MG tablet Take 15 mg by mouth daily.   Past Week   phentermine (ADIPEX-P) 37.5 MG tablet Take 18.75 mg by mouth daily.   Past Week   dexamethasone (DECADRON) 4 MG tablet Take 2 tablets once a day for 2 days after carboplatin and AC chemotherapy. Take with food. (Patient not taking: Reported on 11/30/2021) 30 tablet 0 Completed Course   lidocaine-prilocaine (EMLA) cream Apply to affected area once 30 g 3    ondansetron (ZOFRAN) 8 MG tablet Take 1 tablet (8 mg total) by mouth 2 (two) times daily as needed. Start on the third day after carboplatin and AC chemotherapy. 30 tablet 1 Not Taking   prochlorperazine (COMPAZINE) 10 MG tablet Take 1 tablet (10 mg total) by mouth every 6 (six) hours as needed (Nausea or vomiting). (Patient not taking: Reported on 11/30/2021) 30 tablet 1 Not Taking   No Known Allergies Past Medical History:  Diagnosis Date   Arthritis    knees    COVID-19 12/2020   Depression    Family history of breast cancer    Goiter    Malignant neoplasm of left female breast ([place]) 04/07/2021   Past Surgical History:  Procedure Laterality Date   BREAST BIOPSY Left 04/07/2021   [place]:u/s bx triple neg   BREAST BIOPSY Left 05/20/2021   [place] Axilla Bx, hydro 3 marker, path pending    KNEE ARTHROSCOPY Right 2008   PORTACATH PLACEMENT Right 05/07/2021   Procedure: INSERTION PORT-A-CATH;  Surgeon: Robert Bellow, MD;  Location: [facility] ORS;  Service: General;  Laterality: Right;   TOOTH EXTRACTION     TRANSCERVICAL UTERINE FIBROID(S) ABLATION  2010   Social History   Socioeconomic History   Marital status: Married    Spouse name: Not on file   Number of children: Not on file   Years of education: Not on file   Highest education level: Not on file  Occupational History   Not on file  Tobacco Use   Smoking status: Never   Smokeless tobacco: Never  Vaping Use   Vaping Use: Never used  Substance and Sexual Activity   Alcohol use: Never   Drug use: Never   Sexual activity: Yes  Other Topics Concern   Not on file  Social History Narrative   ** Merged History Encounter **  Social Determinants of Health   Financial Resource Strain: Not on file  Food Insecurity: Not on file  Transportation Needs: Not on file  Physical Activity: Not on file  Stress: Not on file  Social Connections: Not on file  Intimate Partner Violence: Not on file   Social History   Social History Narrative   ** Merged History Encounter **         ROS: Negative.     PE: HEENT: Negative. Lungs: Clear. Cardio: RR.   Assessment/Plan:  Proceed with planned wide excision and SLN biopsy  Robert Bellow 12/10/2021   Assessment/Plan:  Proceed with planned wide excision and SLN biopsy.

## 2021-12-10 NOTE — Transfer of Care (Signed)
Immediate Anesthesia Transfer of Care Note  Patient: Peggy Bates  Procedure(s) Performed: BREAST LUMPECTOMY WITH NEEDLE LOCALIZATION, PECTORAL BLOCK (Left: Breast) AXILLARY SENTINEL NODE BIOPSY (Left: Axilla) ADJACENT TISSUE TRANSFER/TISSUE REARRANGEMENT (Left: Breast)  Patient Location: PACU  Anesthesia Type:General  Level of Consciousness: drowsy  Airway & Oxygen Therapy: Patient Spontanous Breathing and Patient connected to face mask oxygen  Post-op Assessment: Report given to RN  Post vital signs: stable  Last Vitals:  Vitals Value Taken Time  BP    Temp    Pulse 94 12/10/21 1222  Resp    SpO2 100 % 12/10/21 1222  Vitals shown include unvalidated device data.  Last Pain:  Vitals:   12/10/21 0934  TempSrc: Temporal  PainSc: 0-No pain         Complications: No notable events documented.

## 2021-12-10 NOTE — Op Note (Signed)
Preoperative diagnosis: Invasive mammary carcinoma of the left breast.  Status post neoadjuvant chemotherapy.  Postoperative diagnosis: Same.  Operative procedure: Wide local excision with tissue transfer, ultrasound and wire localization.  Sentinel node biopsy.  Operating Surgeon: Hervey Ard, MD.  Anesthesia: General by LMA, Marcaine 0.5% with 1: 200,000 units of epinephrine, 30 cc as pectoralis block and local infiltration.  Estimated blood loss: 10 cc.  Clinical note: This 55 year old woman had a triple negative tumor and underwent neoadjuvant chemotherapy with an excellent clinical and radiologic response.  She was felt to be a candidate for breast conservation.  She underwent wire localization the morning of the procedure and was injected with technetium sulfur colloid.  SCD stockings for DVT prevention.  Antibiotic prophylaxis was not indicated.  Operative note: With the patient under adequate general anesthesia the area of the areola was cleaned with alcohol and a total of 5 cc of 0.5% methylene blue was injected in the subareolar plexus.  The breast was then taped in the medial position and the breast axilla and shoulder cleaned with ChloraPrep and draped.  The area of the wires was examined and with ultrasound to help establish the block of tissue that need to be required.  Image obtained for the permanent record.  A pectoralis block was placed under ultrasound guidance with a 20-gauge spinal needle into the area below the pectoralis major and pectoralis minor muscles.  The remaining local anesthetic was used around the excision site in the upper outer quadrant.  In this location a curvilinear incision was made.  Skin was incised sharply and extended down to the adipose tissue.  This was then cleansed from the anterior surface of the breast parenchyma.  Both localizing wires were brought into the field.  An extra small Alexis wound protector was placed.  A block of tissue 3 x 4 x 7 cm was  resected and orientated making use of the paint kit.  Specimen radiograph showed both clips and both intact wires once the area of the axillary envelope was entered the [place] wound protector was placed.  The sizable previously biopsied node was identified and was removed to sentinel node #1.  3 additional nodes were removed.  Specimen radiograph of the axillary node showed the clip within the largest node.  While the breast specimen was being processed attention was turned to the axilla.  Initial attempts to reach the axillary nodes from the wide excision site were unsuccessful.  An incision along the skin fold at the base of the axillary fold was made.  The skin was incised sharply remaining dissection with electrocautery.  Just above the level of the axillary envelope the [place] wound protector was placed.  A large node with counts of 12,000 was obtained followed by 3 additional nodes.  Scanning to the axilla at this point showed no counts above 300.  Hemostasis of the axillary fat pad was with interrupted 3-0 Vicryl figure-of-eight sutures.  The axillary envelope was then closed with interrupted 2-0 Vicryl figure-of-eight sutures.  The adipose layer was closed in a similar fashion.  The skin was closed with a running 4-0 Vicryl subcuticular suture.  Attention was turned toward closure of the breast defect.  This was approximately 15 cm.  The Alexis wound protector was replaced and the breast was elevated off the underlying pectoralis and serratus muscle.  Once this was completed the volume could be reconstructed by moving tissue from the inferior lateral aspect into the superior medial aspect of the defect.  This was done multiple layers with interrupted 2-0 Vicryl figure-of-eight sutures.  The adipose layer was approximated with interrupted 2-0 Vicryl sutures.  The skin closed with a running 4-0 Vicryl subcuticular suture.  Benzoin and Steri-Strips followed by Telfa, fluff gauze and a compressive wrap  were applied.  Patient tolerated procedure well was taken to the PACU in stable condition.

## 2021-12-10 NOTE — OR Nursing (Signed)
Per Dr. Bary Castilla verble to C. Lennox Grumbles, RN; he does not need to see pt in postop prior to d/c.

## 2021-12-10 NOTE — Anesthesia Preprocedure Evaluation (Signed)
Anesthesia Evaluation  Patient identified by MRN, date of birth, ID band Patient awake    Reviewed: Allergy & Precautions, H&P , NPO status , Patient's Chart, lab work & pertinent test results, reviewed documented beta blocker date and time   Airway Mallampati: II  TM Distance: >3 FB Neck ROM: full    Dental  (+) Teeth Intact   Pulmonary neg pulmonary ROS,    Pulmonary exam normal        Cardiovascular Exercise Tolerance: Good negative cardio ROS Normal cardiovascular exam Rate:Normal     Neuro/Psych PSYCHIATRIC DISORDERS Anxiety Depression  Neuromuscular disease    GI/Hepatic negative GI ROS, Neg liver ROS,   Endo/Other  Morbid obesity  Renal/GU negative Renal ROS  negative genitourinary   Musculoskeletal   Abdominal   Peds  Hematology negative hematology ROS (+)   Anesthesia Other Findings   Reproductive/Obstetrics negative OB ROS                             Anesthesia Physical Anesthesia Plan  ASA: 3  Anesthesia Plan: General LMA   Post-op Pain Management:    Induction:   PONV Risk Score and Plan: 4 or greater  Airway Management Planned:   Additional Equipment:   Intra-op Plan:   Post-operative Plan:   Informed Consent: I have reviewed the patients History and Physical, chart, labs and discussed the procedure including the risks, benefits and alternatives for the proposed anesthesia with the patient or authorized representative who has indicated his/her understanding and acceptance.       Plan Discussed with: CRNA  Anesthesia Plan Comments:         Anesthesia Quick Evaluation

## 2021-12-10 NOTE — Discharge Instructions (Signed)

## 2021-12-10 NOTE — Anesthesia Procedure Notes (Signed)
Procedure Name: LMA Insertion Date/Time: 12/10/2021 10:05 AM Performed by: Lerry Liner, CRNA Pre-anesthesia Checklist: Patient identified, Emergency Drugs available, Suction available and Patient being monitored Patient Re-evaluated:Patient Re-evaluated prior to induction Oxygen Delivery Method: Circle system utilized Preoxygenation: Pre-oxygenation with 100% oxygen Induction Type: IV induction Ventilation: Mask ventilation without difficulty LMA: LMA inserted LMA Size: 4.5 Number of attempts: 1 Tube secured with: Tape

## 2021-12-13 ENCOUNTER — Encounter: Payer: Self-pay | Admitting: General Surgery

## 2021-12-15 NOTE — Anesthesia Postprocedure Evaluation (Signed)
Anesthesia Post Note  Patient: Peggy Bates  Procedure(s) Performed: BREAST LUMPECTOMY WITH NEEDLE LOCALIZATION, PECTORAL BLOCK (Left: Breast) AXILLARY SENTINEL NODE BIOPSY (Left: Axilla) ADJACENT TISSUE TRANSFER/TISSUE REARRANGEMENT (Left: Breast)  Patient location during evaluation: PACU Anesthesia Type: General Level of consciousness: awake and alert Pain management: pain level controlled Vital Signs Assessment: post-procedure vital signs reviewed and stable Respiratory status: spontaneous breathing, nonlabored ventilation, respiratory function stable and patient connected to nasal cannula oxygen Cardiovascular status: blood pressure returned to baseline and stable Postop Assessment: no apparent nausea or vomiting Anesthetic complications: no   No notable events documented.   Last Vitals:  Vitals:   12/10/21 1415 12/10/21 1436  BP: 110/68 110/69  Pulse: 84 81  Resp: 17 16  Temp: 36.6 C 36.6 C  SpO2: 100% 99%    Last Pain:  Vitals:   12/10/21 1436  TempSrc: Temporal  PainSc: 0-No pain                 Molli Barrows

## 2021-12-16 ENCOUNTER — Other Ambulatory Visit: Payer: Self-pay | Admitting: Anatomic Pathology & Clinical Pathology

## 2021-12-21 ENCOUNTER — Other Ambulatory Visit: Payer: Self-pay | Admitting: General Surgery

## 2021-12-21 DIAGNOSIS — C50412 Malignant neoplasm of upper-outer quadrant of left female breast: Secondary | ICD-10-CM

## 2021-12-23 ENCOUNTER — Inpatient Hospital Stay: Attending: Oncology | Admitting: Oncology

## 2021-12-23 ENCOUNTER — Other Ambulatory Visit: Payer: Self-pay

## 2021-12-23 ENCOUNTER — Encounter: Payer: Self-pay | Admitting: Radiation Oncology

## 2021-12-23 ENCOUNTER — Inpatient Hospital Stay

## 2021-12-23 ENCOUNTER — Encounter: Payer: Self-pay | Admitting: Oncology

## 2021-12-23 ENCOUNTER — Ambulatory Visit
Admission: RE | Admit: 2021-12-23 | Discharge: 2021-12-23 | Disposition: A | Discharge status: Home / Self Care | Source: Ambulatory Visit | Attending: Radiation Oncology | Admitting: Radiation Oncology

## 2021-12-23 VITALS — BP 115/76 | Temp 96.7°F | Wt 242.5 lb

## 2021-12-23 VITALS — BP 115/76 | HR 90 | Temp 96.7°F | Wt 242.0 lb

## 2021-12-23 DIAGNOSIS — C50412 Malignant neoplasm of upper-outer quadrant of left female breast: Secondary | ICD-10-CM

## 2021-12-23 DIAGNOSIS — Z8616 Personal history of COVID-19: Secondary | ICD-10-CM | POA: Insufficient documentation

## 2021-12-23 DIAGNOSIS — Z800 Family history of malignant neoplasm of digestive organs: Secondary | ICD-10-CM | POA: Diagnosis not present

## 2021-12-23 DIAGNOSIS — Z171 Estrogen receptor negative status [ER-]: Secondary | ICD-10-CM | POA: Insufficient documentation

## 2021-12-23 DIAGNOSIS — N939 Abnormal uterine and vaginal bleeding, unspecified: Secondary | ICD-10-CM | POA: Diagnosis not present

## 2021-12-23 DIAGNOSIS — D0512 Intraductal carcinoma in situ of left breast: Secondary | ICD-10-CM

## 2021-12-23 DIAGNOSIS — Z803 Family history of malignant neoplasm of breast: Secondary | ICD-10-CM | POA: Diagnosis not present

## 2021-12-23 DIAGNOSIS — Z79899 Other long term (current) drug therapy: Secondary | ICD-10-CM | POA: Diagnosis not present

## 2021-12-23 LAB — CBC WITH DIFFERENTIAL/PLATELET
Abs Immature Granulocytes: 0 10*3/uL (ref 0.00–0.07)
Basophils Absolute: 0 10*3/uL (ref 0.0–0.1)
Basophils Relative: 0 %
Eosinophils Absolute: 0.1 10*3/uL (ref 0.0–0.5)
Eosinophils Relative: 2 %
HCT: 39.9 % (ref 36.0–46.0)
Hemoglobin: 12.6 g/dL (ref 12.0–15.0)
Immature Granulocytes: 0 %
Lymphocytes Relative: 38 %
Lymphs Abs: 1.1 10*3/uL (ref 0.7–4.0)
MCH: 28.8 pg (ref 26.0–34.0)
MCHC: 31.6 g/dL (ref 30.0–36.0)
MCV: 91.1 fL (ref 80.0–100.0)
Monocytes Absolute: 0.4 10*3/uL (ref 0.1–1.0)
Monocytes Relative: 13 %
Neutro Abs: 1.3 10*3/uL — ABNORMAL LOW (ref 1.7–7.7)
Neutrophils Relative %: 47 %
Platelets: 213 10*3/uL (ref 150–400)
RBC: 4.38 MIL/uL (ref 3.87–5.11)
RDW: 13.2 % (ref 11.5–15.5)
WBC: 2.8 10*3/uL — ABNORMAL LOW (ref 4.0–10.5)
nRBC: 0 % (ref 0.0–0.2)

## 2021-12-23 NOTE — Consult Note (Signed)
NEW PATIENT EVALUATION  Name: Peggy Bates  MRN: [id number]  Date:   12/23/2021     DOB: October 04, 1966   This 56 y.o. 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  REFERRING PHYSICIAN: Renee Rival, NP  CHIEF COMPLAINT:  Chief Complaint  Patient presents with   Breast Cancer    DIAGNOSIS: The encounter diagnosis was Carcinoma of upper-outer quadrant of left breast in female, estrogen receptor negative ([place]).   PREVIOUS INVESTIGATIONS:  CT scans MRI scans reviewed mammograms ultrasound and MRI scans of breast reviewed Clinical notes reviewed Pathology report reviewed  HPI: Patient is a 56 year old female who presented with some discomfort in her left breast.Initial mammogram showed a 2.1 cm mass in the lateral left breast 2 o'clock position 4 cm from the nipple.  She underwent stereotactic guided biopsy which was positive for triple negative grade 3 invasive mammary carcinoma.  There was one abnormal N1 borderline abnormal left axillary lymph node although this was biopsied and negative.  MRI scan confirmed a 2.5 x 3 x 2.5 hematoma containing biopsy clip in the upper outer left breast in the region of biopsy-proven malignancy.  Patient underwent neoadjuvant chemotherapy with 4 cycles of DD AC.  She then underwent a wide local excision by Dr. Tollie Pizza showing no residual invasive carcinoma.  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  Margin for the DCIS was 5 mm.  4 sentinel lymph nodes were examined all negative for metastatic disease.  She has done well postoperatively.  She specifically denies breast tenderness cough or bone pain.  She is now referred to radiation oncology for consideration of treatment.  PLANNED TREATMENT REGIMEN: Left whole  breast radiation  PAST MEDICAL HISTORY:  has a past medical history of Arthritis, COVID-19 (12/2020), Depression, Family history of breast cancer, Goiter, and Malignant neoplasm of left female breast ([place]) (04/07/2021).    PAST SURGICAL HISTORY:  Past Surgical History:  Procedure Laterality Date   ADJACENT TISSUE TRANSFER/TISSUE REARRANGEMENT Left 12/10/2021   Procedure: ADJACENT TISSUE TRANSFER/TISSUE REARRANGEMENT;  Surgeon: Robert Bellow, MD;  Location: [facility] ORS;  Service: General;  Laterality: Left;   AXILLARY SENTINEL NODE BIOPSY Left 12/10/2021   Procedure: AXILLARY SENTINEL NODE BIOPSY;  Surgeon: Robert Bellow, MD;  Location: [facility] OR[facility];  Service: General;  Laterality: Left;   BREAST BIOPSY Left 04/07/2021   [place] in [place]:u/s bx triple neg   BREAST BIOPSY Left 05/20/2021   [place] Axilla Bx, hydro 3 marker, path pending    BREAST LUMPECTOMY WITH NEEDLE LOCALIZATION Left 12/10/2021   Procedure: BREAST LUMPECTOMY WITH NEEDLE LOCALIZATION, PECTORAL BLOCK;  Surgeon: Robert Bellow, MD;  Location: [facility] OR[facility];  Service: General;  Laterality: Left;   KNEE ARTHROSCOPY Right 2008   PORTACATH PLACEMENT Right 05/07/2021   Procedure: INSERTION PORT-A-CATH;  Surgeon: Robert Bellow, MD;  Location: [facility] OR[facility];  Service: General;  Laterality: Right;   TOOTH EXTRACTION     TRANSCERVICAL UTERINE FIBROID(S) ABLATION  2010    FAMILY HISTORY: family history includes Breast cancer in her paternal grandmother and sister; Diabetes in her father and sister; Throat cancer in her maternal grandmother.  SOCIAL HISTORY:  reports that she has never smoked. She has never used smokeless tobacco. She reports that she does not drink alcohol and does not use drugs.  ALLERGIES: Patient has no known allergies.  MEDICATIONS:  Current Outpatient Medications  Medication Sig Dispense Refill   acetaminophen (TYLENOL) 650 MG CR tablet Take 1,300 mg by mouth See admin instructions. Take 1300  mg in the morning may take a second 1300 mg dose during the day as needed for pain     cholecalciferol (VITAMIN D) 25 MCG (1000 UNIT) tablet Take 1,000 Units by mouth daily.     diclofenac Sodium (VOLTAREN) 1 % GEL Apply 1 application topically daily.     gabapentin (NEURONTIN) 600 MG tablet Take 600 mg by mouth every morning.     Glucosamine-Chondroitin (MOVE FREE PO) Take 1 tablet by mouth daily.     HYDROcodone-acetaminophen (NORCO/VICODIN) 5-325 MG tablet Take 1 tablet by mouth every 4 (four) hours as needed for moderate pain. 20 tablet 0   meloxicam (MOBIC) 15 MG tablet Take 15 mg by mouth daily.     ondansetron (ZOFRAN) 8 MG tablet Take 1 tablet (8 mg total) by mouth 2 (two) times daily as needed. Start on the third day after carboplatin and AC chemotherapy. 30 tablet 1   phentermine (ADIPEX-P) 37.5 MG tablet Take 18.75 mg by mouth daily.     No current facility-administered medications for this encounter.   Facility-Administered Medications Ordered in Other Encounters  Medication Dose Route Frequency Provider Last Rate Last Admin   heparin lock flush 100 unit/mL  500 Units Intravenous Once Earlie Server, MD       sodium chloride flush (NS) 0.9 % injection 10 mL  10 mL Intravenous Once Earlie Server, MD        ECOG PERFORMANCE STATUS:  0 - Asymptomatic  REVIEW OF SYSTEMS: Patient denies any weight loss, fatigue, weakness, fever, chills or night sweats. Patient denies any loss of vision, blurred vision. Patient denies any ringing  of the ears or hearing loss. No irregular heartbeat. Patient denies heart murmur or history of fainting. Patient denies any chest pain or pain radiating to her upper extremities. Patient denies any shortness of breath, difficulty breathing at night, cough or hemoptysis. Patient denies any swelling in the lower legs. Patient denies any nausea vomiting, vomiting of blood, or coffee ground material in the vomitus. Patient denies any stomach pain. Patient states has had normal  bowel movements no significant constipation or diarrhea. Patient denies any dysuria, hematuria or significant nocturia. Patient denies any problems walking, swelling in the joints or loss of balance. Patient denies any skin changes, loss of hair or loss of weight. Patient denies any excessive worrying or anxiety or significant depression. Patient denies any problems with insomnia. Patient denies excessive thirst, polyuria, polydipsia. Patient denies any swollen glands, patient denies easy bruising or easy bleeding. Patient denies any recent infections, allergies or URI. Patient "s visual fields have not changed significantly in recent time.     PHYSICAL EXAM: BP 115/76    Temp (!) 96.7 F (35.9 C) (Tympanic)    Wt 242 lb 8 oz (110 kg)    BMI 37.98 kg/m  She is status post wide local excision of the left breast.  Incision is healing well.  No dominant masses noted in either breast.  No axillary or supraclavicular adenopathy is identified.  She does have a port placed in the medial aspect of the right chest.  Well-developed well-nourished patient in NAD. HEENT reveals PERLA, EOMI, discs not visualized.  Oral cavity is clear. No oral mucosal lesions are identified. Neck is clear without evidence of cervical or supraclavicular adenopathy.  Lungs are clear to A&P. Cardiac examination is essentially unremarkable with regular rate and rhythm without murmur rub or thrill. Abdomen is benign with no organomegaly or masses noted. Motor sensory and DTR levels are equal and symmetric in the upper and lower extremities. Cranial nerves II through XII are grossly intact. Proprioception is intact. No peripheral adenopathy or edema is identified. No motor or sensory levels are noted. Crude visual fields are within normal range.  LABORATORY DATA: Pathology report reviewed    RADIOLOGY RESULTS: Ultrasound mammograms and MRI scans reviewed compatible with above-stated findings   IMPRESSION: Stage IIa triple negative  invasive mammary carcinoma left breast status post neoadjuvant chemotherapy followed by wide local excision and sentinel node biopsy in 56 year old female  PLAN: At this time patient has a large and pendulous breast.  This would make hypofractionated course of treatment difficult.  I would plan on delivering 5040 cGy in 28 fractions to her left whole breast.  I would also boost her scar another 1000 cGy using electron beam.  Risks and benefits of treatment including skin reaction fatigue alteration blood counts possible inclusion of superficial lung all were reviewed in detail with the patient.  She seems to comprehend my treatment plan well.  I have personally ordered CT simulation for next week.  Patient comprehends my recommendations well.  I would like to take this opportunity to thank you for allowing me to participate in the care of your patient.Noreene Filbert, MD

## 2021-12-23 NOTE — Progress Notes (Signed)
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  female with PMH listed below was seen in consultation at the request of  Peggy Rival, NP  for evaluation of triple negative breast cancer.  03/30/2021, screening mammogram with 3D 2 nodular areas of asymmetric density demonstrated within the superior lateral aspect of the left breast middle third depth.  Finding was best appreciated on 3D tomosynthesis imaging. Targeted ultrasound showed left upper outer breast 1:30 position 1.1 cm round hypoechoic mass with angular margins. 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 Bary Castilla yesterday.  She presents to establish care with me today Patient did not notice this left mass prior to the mammogram.   Case was discussed on Tumor board.  Pathology reports and mammogram/ultrasound were done at outside facility and results were reviewed and discussed with patient and her husband.  LN status was not mentioned on her [place]. Recommend additional images with mammogram and MRI, neoadjuvant chemotherapy  Family history of breast cancer: Breast cancer in her sister, her late 44s, early 55s; and being paternal grandmother Menarche: 88 Age at first live childbirth: 67  patient has 2 sons and 1 daughter.  Daughter has passed away Used OCP:  Used estrogen and progesterone therapy: Denies History of Radiation to the chest: Denies Previous of breast biopsy: Denies  05/12/2021 diagnostic mammogram of left breast showed 2.1 cm biopsy-proven malignancy in the lateral left  breast at 2:00, 4 cm from nipple.  Directly adjacent cystic mass, 1.5 cm in size, consistent with biopsy hematoma.  1 abnormal and 1 borderline abnormal left axillary lymph node  05/18/2021 bilateral breast MRI with and without contrast showed 2.5 x 3 x 2.5 cm hematoma containing biopsy clip artifact within the upper outer left breast.  1.6 cm nodular non-mass-like enhancement 2.5 cm posterior/superior to the biopsy clip may represent biopsy-proven malignancy or additional areas of malignancy.  Single abnormal appearing left axillary lymph node with eccentric cortical thickening.  No MRI evidence of the right breast malignancy.  #05/07/2021, Mediport was placed by Dr. Bary Castilla #05/14/2021 echocardiogram showed LVEF 60-55%  05/20/2021 Slide consultation of her left breast biopsy from outside institution - invasive mammary carcinoma, no special type, grade 3, DCIS and LVI not identified. ER negative, PR negative, HER2 IHC negative. Ki 67 70-80%    05/20/2021 ultrasound-guided left axillary lymph node was negative for malignancy.  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  D15- not done due to cytopenia  08/30/2021 MRI breast showed no abnormal enhancement remains in the left breast. Complete response to chemotherapy is identified. No MRI evidence of malignancy in either breast.  11/15/2021 bilateral MRI breast showed no abnormal enhancement is identified in bilateral breast.  INTERVAL HISTORY Peggy Bates is a 56 y.o. female who has above history reviewed by me today presents for follow up visit for management of left triple negative breast cancer 12/10/2021, patient underwent left upper outer lumpectomy with sentinel lymph  node biopsy.  Residual DCIS, high-grade, 4 sentinel lymph nodes were harvested and all  negative for malignancy.  Margins are negative for DCIS, closest margin is greater than 5 mm. ypTis ypN0  Patient reports feeling well today.  Fatigue is slightly improving.  No fever, chills.  She denies any concerns at her lumpectomy site. Patient had IUD which were removed during chemotherapy.  She reports some vaginal spotting recently. . Review of Systems  Constitutional:  Positive for fatigue. Negative for appetite change, chills and fever.  HENT:   Negative for hearing loss and voice change.   Eyes:  Negative for eye problems.  Respiratory:  Negative for chest tightness and cough.   Cardiovascular:  Negative for chest pain.  Gastrointestinal:  Negative for abdominal distention, abdominal pain and blood in stool.  Endocrine: Negative for hot flashes.  Genitourinary:  Negative for difficulty urinating and frequency.   Musculoskeletal:  Negative for arthralgias.  Skin:  Negative for itching and rash.  Neurological:  Positive for numbness. Negative for extremity weakness.  Hematological:  Negative for adenopathy.  Psychiatric/Behavioral:  Negative for confusion.    MEDICAL HISTORY:  Past Medical History:  Diagnosis Date   Arthritis    knees   COVID-19 12/2020   Depression    Family history of breast cancer    Goiter    Malignant neoplasm of left female breast ([place]) 04/07/2021    SURGICAL HISTORY: Past Surgical History:  Procedure Laterality Date   ADJACENT TISSUE TRANSFER/TISSUE REARRANGEMENT Left 12/10/2021   Procedure: ADJACENT TISSUE TRANSFER/TISSUE REARRANGEMENT;  Surgeon: Robert Bellow, MD;  Location: [facility];  Service: General;  Laterality: Left;   AXILLARY SENTINEL NODE BIOPSY Left 12/10/2021   Procedure: AXILLARY SENTINEL NODE BIOPSY;  Surgeon: Robert Bellow, MD;  Location: [facility];  Service: General;  Laterality: Left;   BREAST BIOPSY Left 04/07/2021   [place]:u/s bx triple neg   BREAST BIOPSY Left 05/20/2021   [place] Axilla Bx,  hydro 3 marker, path pending    BREAST LUMPECTOMY WITH NEEDLE LOCALIZATION Left 12/10/2021   Procedure: BREAST LUMPECTOMY WITH NEEDLE LOCALIZATION, PECTORAL BLOCK;  Surgeon: Robert Bellow, MD;  Location: [facility];  Service: General;  Laterality: Left;   KNEE ARTHROSCOPY Right 2008   PORTACATH PLACEMENT Right 05/07/2021   Procedure: INSERTION PORT-A-CATH;  Surgeon: Robert Bellow, MD;  Location: [facility];  Service: General;  Laterality: Right;   TOOTH EXTRACTION     TRANSCERVICAL UTERINE FIBROID(S) ABLATION  2010    SOCIAL HISTORY: Social History   Socioeconomic History   Marital status: Married    Spouse name: Not on file   Number of children: Not on file   Years of education: Not on file   Highest education level: Not on file  Occupational History   Not on file  Tobacco Use   Smoking status: Never   Smokeless tobacco: Never  Vaping Use   Vaping Use: Never used  Substance and Sexual Activity   Alcohol use: Never   Drug use: Never   Sexual activity: Yes  Other Topics Concern   Not on file  Social History Narrative   ** Merged History Encounter **       Social Determinants of Health   Financial Resource Strain: Not on file  Food Insecurity: Not on file  Transportation Needs: Not on file  Physical Activity: Not on file  Stress: Not on file  Social Connections: Not on file  Intimate Partner Violence: Not on  file    FAMILY HISTORY: Family History  Problem Relation Age of Onset   Breast cancer Paternal Grandmother    Breast cancer Sister        dx 41s, recurrence x3   Diabetes Sister    Diabetes Father    Throat cancer Maternal Grandmother     ALLERGIES:  has No Known Allergies.  MEDICATIONS:  Current Outpatient Medications  Medication Sig Dispense Refill   acetaminophen (TYLENOL) 650 MG CR tablet Take 1,300 mg by mouth See admin instructions. Take 1300 mg in the morning may take a second 1300 mg dose during the day as needed for pain      cholecalciferol (VITAMIN D) 25 MCG (1000 UNIT) tablet Take 1,000 Units by mouth daily.     diclofenac Sodium (VOLTAREN) 1 % GEL Apply 1 application topically daily.     gabapentin (NEURONTIN) 600 MG tablet Take 600 mg by mouth every morning.     Glucosamine-Chondroitin (MOVE FREE PO) Take 1 tablet by mouth daily.     HYDROcodone-acetaminophen (NORCO/VICODIN) 5-325 MG tablet Take 1 tablet by mouth every 4 (four) hours as needed for moderate pain. 20 tablet 0   meloxicam (MOBIC) 15 MG tablet Take 15 mg by mouth daily.     ondansetron (ZOFRAN) 8 MG tablet Take 1 tablet (8 mg total) by mouth 2 (two) times daily as needed. Start on the third day after carboplatin and AC chemotherapy. 30 tablet 1   phentermine (ADIPEX-P) 37.5 MG tablet Take 18.75 mg by mouth daily.     No current facility-administered medications for this visit.   Facility-Administered Medications Ordered in Other Visits  Medication Dose Route Frequency Provider Last Rate Last Admin   heparin lock flush 100 unit/mL  500 Units Intravenous Once Earlie Server, MD       sodium chloride flush (NS) 0.9 % injection 10 mL  10 mL Intravenous Once Earlie Server, MD         PHYSICAL EXAMINATION: ECOG PERFORMANCE STATUS: 0 - Asymptomatic Vitals:   12/23/21 1050  BP: 115/76  Pulse: 90  Temp: (!) 96.7 F (35.9 C)   Filed Weights   12/23/21 1050  Weight: 242 lb (109.8 kg)    Physical Exam Constitutional:      General: She is not in acute distress. HENT:     Head: Normocephalic and atraumatic.  Eyes:     General: No scleral icterus. Cardiovascular:     Rate and Rhythm: Normal rate and regular rhythm.     Heart sounds: Normal heart sounds.  Pulmonary:     Effort: Pulmonary effort is normal. No respiratory distress.     Breath sounds: No wheezing.  Abdominal:     General: Bowel sounds are normal. There is no distension.     Palpations: Abdomen is soft.  Musculoskeletal:        General: No deformity. Normal range of motion.      Cervical back: Normal range of motion and neck supple.  Skin:    General: Skin is warm and dry.     Findings: No erythema or rash.  Neurological:     Mental Status: She is alert and oriented to person, place, and time. Mental status is at baseline.     Cranial Nerves: No cranial nerve deficit.     Coordination: Coordination normal.  Psychiatric:        Mood and Affect: Mood normal.    LABORATORY DATA:  I have reviewed the data as listed Lab Results  Component Value Date   WBC 2.8 (L) 12/23/2021   HGB 12.6 12/23/2021   HCT 39.9 12/23/2021   MCV 91.1 12/23/2021   PLT 213 12/23/2021   Recent Labs    09/30/21 1303 10/14/21 1244 10/28/21 0810  NA 137 137 137  K 4.0 4.4 4.0  CL 108 105 105  CO2 '24 26 26  ' GLUCOSE 91 92 102*  BUN 23* 13 15  CREATININE 0.67 0.58 0.67  CALCIUM 9.0 9.2 9.0  GFRNONAA >60 >60 >60  PROT 7.5 7.6 7.5  ALBUMIN 4.2 4.4 4.2  AST 34 24 22  ALT 35 20 23  ALKPHOS 79 81 95  BILITOT 0.2* 0.5 0.3    Iron/TIBC/Ferritin/ %Sat No results found for: IRON, TIBC, FERRITIN, IRONPCTSAT    RADIOGRAPHIC STUDIES: I have personally reviewed the radiological images as listed and agreed with the findings in the report. MR BREAST BILATERAL W WO CONTRAST INC CAD  Result Date: 11/15/2021 CLINICAL DATA:  Follow-up known left breast cancer post neoadjuvant therapy. EXAM: BILATERAL BREAST MRI WITH AND WITHOUT CONTRAST TECHNIQUE: Multiplanar, multisequence MR images of both breasts were obtained prior to and following the intravenous administration of 10 ml of Gadavist Three-dimensional MR images were rendered by post-processing of the original MR data on an independent workstation. The three-dimensional MR images were interpreted, and findings are reported in the following complete MRI report for this study. Three dimensional images were evaluated at the independent interpreting workstation using the DynaCAD thin client. COMPARISON:  Prior films FINDINGS: Breast composition:  b. Scattered fibroglandular tissue. Background parenchymal enhancement: Minimal. Right breast: No mass or abnormal enhancement. Left breast: No mass or abnormal enhancement. Lymph nodes: No abnormal appearing lymph nodes. Ancillary findings:  None. IMPRESSION: Known left breast cancer. No abnormal enhancement is identified in bilateral breasts. RECOMMENDATION: Treatment plan. BI-RADS CATEGORY  6: Known biopsy-proven malignancy. Electronically Signed   By: Abelardo Diesel M.D.   On: 11/15/2021 11:39  NM Sentinel Node Inj-No Rpt (Breast)  Result Date: 12/10/2021 Sulfur Colloid was injected by the Nuclear Medicine Technologist for sentinel lymph node localization.   MM Breast Surgical Specimen  Result Date: 12/10/2021 CLINICAL DATA:  56 year old female status post bracketed left breast lumpectomy. EXAM: SPECIMEN RADIOGRAPH OF THE LEFT BREAST COMPARISON:  Previous exam(s). FINDINGS: Status post excision of the left breast. The wire tips and biopsy marker clips are present and are marked for pathology. IMPRESSION: Specimen radiograph of the left breast. Electronically Signed   By: Kristopher Oppenheim M.D.   On: 12/10/2021 11:14  MM LT PLC BREAST LOC DEV   1ST LESION  INC MAMMO GUIDE  Result Date: 12/10/2021 CLINICAL DATA:  56 year old female with 2 sites of biopsy proven left breast cancer. EXAM: NEEDLE LOCALIZATION OF THE LEFT BREAST WITH MAMMO GUIDANCE COMPARISON:  Previous exams. PROCEDURE: Patient presents for needle localization prior to surgical excision of the left breast. I met with the patient and we discussed the procedure of needle localization including benefits and alternatives. We discussed the high likelihood of a successful procedure. We discussed the risks of the procedure, including infection, bleeding, tissue injury, and further surgery. Informed, written consent was given. The usual time-out protocol was performed immediately prior to the procedure. Using mammographic guidance, sterile  technique, 1% lidocaine and a 5 cm modified Kopans needle, both the anterior and posterior post biopsy clips in the lateral left breast were localized using lateral approach. The images were marked for Dr. Bary Castilla. IMPRESSION: Bracketed needle localization of the left breast. No  apparent complications. Electronically Signed   By: Kristopher Oppenheim M.D.   On: 12/10/2021 08:54  MM LT PLC BREAST LOC DEV   EA ADD LESION  INC MAMMO GUIDE  Result Date: 12/10/2021 CLINICAL DATA:  56 year old female with 2 sites of biopsy proven left breast cancer. EXAM: NEEDLE LOCALIZATION OF THE LEFT BREAST WITH MAMMO GUIDANCE COMPARISON:  Previous exams. PROCEDURE: Patient presents for needle localization prior to surgical excision of the left breast. I met with the patient and we discussed the procedure of needle localization including benefits and alternatives. We discussed the high likelihood of a successful procedure. We discussed the risks of the procedure, including infection, bleeding, tissue injury, and further surgery. Informed, written consent was given. The usual time-out protocol was performed immediately prior to the procedure. Using mammographic guidance, sterile technique, 1% lidocaine and a 5 cm modified Kopans needle, both the anterior and posterior post biopsy clips in the lateral left breast were localized using lateral approach. The images were marked for Dr. Bary Castilla. IMPRESSION: Bracketed needle localization of the left breast. No apparent complications. Electronically Signed   By: Kristopher Oppenheim M.D.   On: 12/10/2021 08:54      ASSESSMENT & PLAN:  1. Carcinoma of upper-outer quadrant of left breast in female, estrogen receptor negative ([place])   2. Vaginal bleeding   3. Ductal carcinoma in situ (DCIS) of left breast   . Cancer Staging  Carcinoma of upper-outer quadrant of left breast in female, estrogen receptor negative ([place]) Staging form: Breast, AJCC 8th Edition - Clinical stage from 04/29/2021: Stage  IIB (cT2, cN0, cM0, G3, ER-, PR-, HER2-) - Signed by Earlie Server, MD on 06/04/2021   #Left breast invasive mammary carcinoma,lateral left,  triple negative,  Left upper outer quadrant mass-triple negative + left breast additional non mass enhancement 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  Status post neoadjuvant chemotherapy with pembrolizumab/carboplatin Q 3 weeks / weekly Taxol- followed by ddAC x4. ypTis ypN0.  Patient has reached complete pathological response.  She will not need any additional adjuvant chemotherapy I would like to present her case at tumor board regarding adjuvant endocrine therapy given that the non mass enchancement was DCIS with weak ER staining. Recommend patient to see radiation oncology for adjuvant radiation.  #Chemotherapy-induced anemia, I will repeat a CBC today. #Chemotherapy-induced neuropathy, patient is currently on gabapentin 600 mg daily for mood disorder.  She prefers not to further increase gabapentin dosage or add additional agents for neuropathy.  Her symptoms are stable. #Vaginal spotting, patient is in the age range of perimenopausal.  I will check estradiol, FSH. We spent sufficient time to discuss many aspect of care, questions were answered to patient's satisfaction.  All questions were answered. The patient knows to call the clinic with any problems questions or concerns.  cc Peggy Rival, NP    Return of visit: Follow-up in 3 months.  Earlie Server, MD, [name] 12/23/2021

## 2021-12-24 LAB — ESTRADIOL: Estradiol: 26.1 pg/mL

## 2021-12-24 LAB — FOLLICLE STIMULATING HORMONE: FSH: 65.7 m[IU]/mL

## 2021-12-27 ENCOUNTER — Encounter

## 2021-12-27 ENCOUNTER — Ambulatory Visit
Admission: RE | Admit: 2021-12-27 | Discharge: 2021-12-27 | Disposition: A | Discharge status: Home / Self Care | Source: Ambulatory Visit | Attending: Radiation Oncology | Admitting: Radiation Oncology

## 2021-12-27 DIAGNOSIS — C50412 Malignant neoplasm of upper-outer quadrant of left female breast: Secondary | ICD-10-CM | POA: Insufficient documentation

## 2021-12-27 DIAGNOSIS — Z510 Encounter for antineoplastic radiation therapy: Secondary | ICD-10-CM | POA: Insufficient documentation

## 2021-12-27 DIAGNOSIS — Z171 Estrogen receptor negative status [ER-]: Secondary | ICD-10-CM | POA: Diagnosis not present

## 2021-12-27 LAB — SURGICAL PATHOLOGY

## 2021-12-28 ENCOUNTER — Telehealth: Payer: Self-pay

## 2021-12-28 NOTE — Telephone Encounter (Signed)
-----   Message from Earlie Server, MD sent at 12/27/2021  9:21 PM EST ----- Please move her appt up week of 3/20 thanks.

## 2021-12-28 NOTE — Telephone Encounter (Signed)
Please move up appts and notify pt of updated appt details.

## 2021-12-29 DIAGNOSIS — Z510 Encounter for antineoplastic radiation therapy: Secondary | ICD-10-CM | POA: Diagnosis not present

## 2021-12-31 ENCOUNTER — Other Ambulatory Visit: Payer: Self-pay | Admitting: *Deleted

## 2021-12-31 DIAGNOSIS — Z171 Estrogen receptor negative status [ER-]: Secondary | ICD-10-CM

## 2021-12-31 DIAGNOSIS — C50412 Malignant neoplasm of upper-outer quadrant of left female breast: Secondary | ICD-10-CM

## 2022-01-03 ENCOUNTER — Ambulatory Visit: Admission: RE | Admit: 2022-01-03 | Source: Ambulatory Visit

## 2022-01-03 DIAGNOSIS — Z510 Encounter for antineoplastic radiation therapy: Secondary | ICD-10-CM | POA: Diagnosis not present

## 2022-01-04 ENCOUNTER — Ambulatory Visit
Admission: RE | Admit: 2022-01-04 | Discharge: 2022-01-04 | Disposition: A | Discharge status: Home / Self Care | Source: Ambulatory Visit | Attending: Radiation Oncology | Admitting: Radiation Oncology

## 2022-01-04 DIAGNOSIS — Z510 Encounter for antineoplastic radiation therapy: Secondary | ICD-10-CM | POA: Diagnosis not present

## 2022-01-05 ENCOUNTER — Ambulatory Visit
Admission: RE | Admit: 2022-01-05 | Discharge: 2022-01-05 | Disposition: A | Discharge status: Home / Self Care | Source: Ambulatory Visit | Attending: Radiation Oncology | Admitting: Radiation Oncology

## 2022-01-05 DIAGNOSIS — Z510 Encounter for antineoplastic radiation therapy: Secondary | ICD-10-CM | POA: Diagnosis not present

## 2022-01-06 ENCOUNTER — Ambulatory Visit
Admission: RE | Admit: 2022-01-06 | Discharge: 2022-01-06 | Disposition: A | Discharge status: Home / Self Care | Source: Ambulatory Visit | Attending: Radiation Oncology | Admitting: Radiation Oncology

## 2022-01-06 DIAGNOSIS — Z510 Encounter for antineoplastic radiation therapy: Secondary | ICD-10-CM | POA: Diagnosis not present

## 2022-01-07 ENCOUNTER — Ambulatory Visit
Admission: RE | Admit: 2022-01-07 | Discharge: 2022-01-07 | Disposition: A | Discharge status: Home / Self Care | Source: Ambulatory Visit | Attending: Radiation Oncology | Admitting: Radiation Oncology

## 2022-01-07 DIAGNOSIS — Z510 Encounter for antineoplastic radiation therapy: Secondary | ICD-10-CM | POA: Diagnosis not present

## 2022-01-10 ENCOUNTER — Ambulatory Visit
Admission: RE | Admit: 2022-01-10 | Discharge: 2022-01-10 | Disposition: A | Discharge status: Home / Self Care | Source: Ambulatory Visit | Attending: Radiation Oncology | Admitting: Radiation Oncology

## 2022-01-10 DIAGNOSIS — Z510 Encounter for antineoplastic radiation therapy: Secondary | ICD-10-CM | POA: Diagnosis not present

## 2022-01-11 ENCOUNTER — Ambulatory Visit
Admission: RE | Admit: 2022-01-11 | Discharge: 2022-01-11 | Disposition: A | Discharge status: Home / Self Care | Source: Ambulatory Visit | Attending: Radiation Oncology | Admitting: Radiation Oncology

## 2022-01-11 DIAGNOSIS — Z510 Encounter for antineoplastic radiation therapy: Secondary | ICD-10-CM | POA: Diagnosis not present

## 2022-01-12 ENCOUNTER — Other Ambulatory Visit: Payer: Self-pay | Admitting: *Deleted

## 2022-01-12 ENCOUNTER — Ambulatory Visit
Admission: RE | Admit: 2022-01-12 | Discharge: 2022-01-12 | Disposition: A | Discharge status: Home / Self Care | Source: Ambulatory Visit | Attending: Radiation Oncology | Admitting: Radiation Oncology

## 2022-01-12 DIAGNOSIS — Z510 Encounter for antineoplastic radiation therapy: Secondary | ICD-10-CM | POA: Diagnosis not present

## 2022-01-12 MED ORDER — SULFAMETHOXAZOLE-TRIMETHOPRIM 800-160 MG PO TABS: 1.0000 | ORAL_TABLET | Freq: Two times a day (BID) | ORAL | 0 refills | Status: DC

## 2022-01-13 ENCOUNTER — Encounter: Payer: Self-pay | Admitting: Oncology

## 2022-01-13 ENCOUNTER — Ambulatory Visit
Admission: RE | Admit: 2022-01-13 | Discharge: 2022-01-13 | Disposition: A | Discharge status: Home / Self Care | Source: Ambulatory Visit | Attending: Radiation Oncology | Admitting: Radiation Oncology

## 2022-01-13 DIAGNOSIS — Z510 Encounter for antineoplastic radiation therapy: Secondary | ICD-10-CM | POA: Diagnosis not present

## 2022-01-14 ENCOUNTER — Ambulatory Visit
Admission: RE | Admit: 2022-01-14 | Discharge: 2022-01-14 | Disposition: A | Discharge status: Home / Self Care | Source: Ambulatory Visit | Attending: Radiation Oncology | Admitting: Radiation Oncology

## 2022-01-14 DIAGNOSIS — Z510 Encounter for antineoplastic radiation therapy: Secondary | ICD-10-CM | POA: Diagnosis not present

## 2022-01-17 ENCOUNTER — Ambulatory Visit
Admission: RE | Admit: 2022-01-17 | Discharge: 2022-01-17 | Disposition: A | Discharge status: Home / Self Care | Source: Ambulatory Visit | Attending: Radiation Oncology | Admitting: Radiation Oncology

## 2022-01-17 DIAGNOSIS — Z510 Encounter for antineoplastic radiation therapy: Secondary | ICD-10-CM | POA: Diagnosis not present

## 2022-01-18 ENCOUNTER — Ambulatory Visit
Admission: RE | Admit: 2022-01-18 | Discharge: 2022-01-18 | Disposition: A | Discharge status: Home / Self Care | Source: Ambulatory Visit | Attending: Radiation Oncology | Admitting: Radiation Oncology

## 2022-01-18 DIAGNOSIS — Z510 Encounter for antineoplastic radiation therapy: Secondary | ICD-10-CM | POA: Diagnosis not present

## 2022-01-19 ENCOUNTER — Other Ambulatory Visit: Payer: Self-pay

## 2022-01-19 ENCOUNTER — Inpatient Hospital Stay: Admitting: Oncology

## 2022-01-19 ENCOUNTER — Ambulatory Visit
Admission: RE | Admit: 2022-01-19 | Discharge: 2022-01-19 | Disposition: A | Discharge status: Home / Self Care | Source: Ambulatory Visit | Attending: Radiation Oncology | Admitting: Radiation Oncology

## 2022-01-19 DIAGNOSIS — Z452 Encounter for adjustment and management of vascular access device: Secondary | ICD-10-CM | POA: Insufficient documentation

## 2022-01-19 DIAGNOSIS — Z95828 Presence of other vascular implants and grafts: Secondary | ICD-10-CM

## 2022-01-19 DIAGNOSIS — Z9221 Personal history of antineoplastic chemotherapy: Secondary | ICD-10-CM | POA: Insufficient documentation

## 2022-01-19 DIAGNOSIS — C50412 Malignant neoplasm of upper-outer quadrant of left female breast: Secondary | ICD-10-CM | POA: Insufficient documentation

## 2022-01-19 DIAGNOSIS — Z510 Encounter for antineoplastic radiation therapy: Secondary | ICD-10-CM | POA: Insufficient documentation

## 2022-01-19 DIAGNOSIS — Z171 Estrogen receptor negative status [ER-]: Secondary | ICD-10-CM | POA: Insufficient documentation

## 2022-01-19 LAB — CBC
HCT: 38.6 % (ref 36.0–46.0)
Hemoglobin: 12.2 g/dL (ref 12.0–15.0)
MCH: 27.7 pg (ref 26.0–34.0)
MCHC: 31.6 g/dL (ref 30.0–36.0)
MCV: 87.5 fL (ref 80.0–100.0)
Platelets: 219 10*3/uL (ref 150–400)
RBC: 4.41 MIL/uL (ref 3.87–5.11)
RDW: 12.7 % (ref 11.5–15.5)
WBC: 2.2 10*3/uL — ABNORMAL LOW (ref 4.0–10.5)
nRBC: 0 % (ref 0.0–0.2)

## 2022-01-19 MED ORDER — SODIUM CHLORIDE 0.9% FLUSH
10.0000 mL | INTRAVENOUS | Status: AC | PRN
  Filled 2022-01-19: qty 10

## 2022-01-19 MED ORDER — HEPARIN SOD (PORK) LOCK FLUSH 100 UNIT/ML IV SOLN
500.0000 [IU] | Freq: Once | INTRAVENOUS | Status: AC
  Filled 2022-01-19: qty 5

## 2022-01-20 ENCOUNTER — Ambulatory Visit
Admission: RE | Admit: 2022-01-20 | Discharge: 2022-01-20 | Disposition: A | Discharge status: Home / Self Care | Source: Ambulatory Visit | Attending: Radiation Oncology | Admitting: Radiation Oncology

## 2022-01-20 DIAGNOSIS — Z510 Encounter for antineoplastic radiation therapy: Secondary | ICD-10-CM | POA: Diagnosis not present

## 2022-01-21 ENCOUNTER — Ambulatory Visit
Admission: RE | Admit: 2022-01-21 | Discharge: 2022-01-21 | Disposition: A | Discharge status: Home / Self Care | Source: Ambulatory Visit | Attending: Radiation Oncology | Admitting: Radiation Oncology

## 2022-01-21 DIAGNOSIS — Z510 Encounter for antineoplastic radiation therapy: Secondary | ICD-10-CM | POA: Diagnosis not present

## 2022-01-24 ENCOUNTER — Ambulatory Visit
Admission: RE | Admit: 2022-01-24 | Discharge: 2022-01-24 | Disposition: A | Discharge status: Home / Self Care | Source: Ambulatory Visit | Attending: Radiation Oncology | Admitting: Radiation Oncology

## 2022-01-24 DIAGNOSIS — Z510 Encounter for antineoplastic radiation therapy: Secondary | ICD-10-CM | POA: Diagnosis not present

## 2022-01-25 ENCOUNTER — Ambulatory Visit
Admission: RE | Admit: 2022-01-25 | Discharge: 2022-01-25 | Disposition: A | Discharge status: Home / Self Care | Source: Ambulatory Visit | Attending: Radiation Oncology | Admitting: Radiation Oncology

## 2022-01-25 DIAGNOSIS — Z510 Encounter for antineoplastic radiation therapy: Secondary | ICD-10-CM | POA: Diagnosis not present

## 2022-01-26 ENCOUNTER — Ambulatory Visit
Admission: RE | Admit: 2022-01-26 | Discharge: 2022-01-26 | Disposition: A | Discharge status: Home / Self Care | Source: Ambulatory Visit | Attending: Radiation Oncology | Admitting: Radiation Oncology

## 2022-01-26 DIAGNOSIS — Z510 Encounter for antineoplastic radiation therapy: Secondary | ICD-10-CM | POA: Diagnosis not present

## 2022-01-27 ENCOUNTER — Ambulatory Visit
Admission: RE | Admit: 2022-01-27 | Discharge: 2022-01-27 | Disposition: A | Discharge status: Home / Self Care | Source: Ambulatory Visit | Attending: Radiation Oncology | Admitting: Radiation Oncology

## 2022-01-27 DIAGNOSIS — Z510 Encounter for antineoplastic radiation therapy: Secondary | ICD-10-CM | POA: Diagnosis not present

## 2022-01-28 ENCOUNTER — Ambulatory Visit
Admission: RE | Admit: 2022-01-28 | Discharge: 2022-01-28 | Disposition: A | Discharge status: Home / Self Care | Source: Ambulatory Visit | Attending: Radiation Oncology | Admitting: Radiation Oncology

## 2022-01-28 DIAGNOSIS — Z510 Encounter for antineoplastic radiation therapy: Secondary | ICD-10-CM | POA: Diagnosis not present

## 2022-01-31 ENCOUNTER — Ambulatory Visit
Admission: RE | Admit: 2022-01-31 | Discharge: 2022-01-31 | Disposition: A | Discharge status: Home / Self Care | Source: Ambulatory Visit | Attending: Radiation Oncology | Admitting: Radiation Oncology

## 2022-01-31 DIAGNOSIS — Z510 Encounter for antineoplastic radiation therapy: Secondary | ICD-10-CM | POA: Diagnosis not present

## 2022-02-01 ENCOUNTER — Ambulatory Visit
Admission: RE | Admit: 2022-02-01 | Discharge: 2022-02-01 | Disposition: A | Discharge status: Home / Self Care | Source: Ambulatory Visit | Attending: Radiation Oncology | Admitting: Radiation Oncology

## 2022-02-01 ENCOUNTER — Ambulatory Visit

## 2022-02-01 DIAGNOSIS — Z510 Encounter for antineoplastic radiation therapy: Secondary | ICD-10-CM | POA: Diagnosis not present

## 2022-02-02 ENCOUNTER — Inpatient Hospital Stay

## 2022-02-02 ENCOUNTER — Ambulatory Visit
Admission: RE | Admit: 2022-02-02 | Discharge: 2022-02-02 | Disposition: A | Discharge status: Home / Self Care | Source: Ambulatory Visit | Attending: Radiation Oncology | Admitting: Radiation Oncology

## 2022-02-02 ENCOUNTER — Other Ambulatory Visit: Payer: Self-pay

## 2022-02-02 DIAGNOSIS — C50412 Malignant neoplasm of upper-outer quadrant of left female breast: Secondary | ICD-10-CM

## 2022-02-02 DIAGNOSIS — Z95828 Presence of other vascular implants and grafts: Secondary | ICD-10-CM

## 2022-02-02 DIAGNOSIS — Z510 Encounter for antineoplastic radiation therapy: Secondary | ICD-10-CM | POA: Diagnosis not present

## 2022-02-02 LAB — COMPREHENSIVE METABOLIC PANEL
ALT: 44 U/L (ref 0–44)
AST: 36 U/L (ref 15–41)
Albumin: 4.2 g/dL (ref 3.5–5.0)
Alkaline Phosphatase: 93 U/L (ref 38–126)
Anion gap: 6 (ref 5–15)
BUN: 13 mg/dL (ref 6–20)
CO2: 25 mmol/L (ref 22–32)
Calcium: 9.6 mg/dL (ref 8.9–10.3)
Chloride: 104 mmol/L (ref 98–111)
Creatinine, Ser: 0.66 mg/dL (ref 0.44–1.00)
GFR, Estimated: >60 mL/min (ref >60)
Glucose, Bld: 102 mg/dL — ABNORMAL HIGH (ref 70–99)
Potassium: 4.1 mmol/L (ref 3.5–5.1)
Sodium: 135 mmol/L (ref 135–145)
Total Bilirubin: 0.2 mg/dL — ABNORMAL LOW (ref 0.3–1.2)
Total Protein: 7.8 g/dL (ref 6.5–8.1)

## 2022-02-02 LAB — CBC WITH DIFFERENTIAL/PLATELET
Abs Immature Granulocytes: 0 10*3/uL (ref 0.00–0.07)
Basophils Absolute: 0 10*3/uL (ref 0.0–0.1)
Basophils Relative: 1 %
Eosinophils Absolute: 0 10*3/uL (ref 0.0–0.5)
Eosinophils Relative: 2 %
HCT: 40.5 % (ref 36.0–46.0)
Hemoglobin: 12.6 g/dL (ref 12.0–15.0)
Immature Granulocytes: 0 %
Lymphocytes Relative: 39 %
Lymphs Abs: 0.7 10*3/uL (ref 0.7–4.0)
MCH: 27.7 pg (ref 26.0–34.0)
MCHC: 31.1 g/dL (ref 30.0–36.0)
MCV: 89 fL (ref 80.0–100.0)
Monocytes Absolute: 0.3 10*3/uL (ref 0.1–1.0)
Monocytes Relative: 16 %
Neutro Abs: 0.8 10*3/uL — ABNORMAL LOW (ref 1.7–7.7)
Neutrophils Relative %: 42 %
Platelets: 181 10*3/uL (ref 150–400)
RBC: 4.55 MIL/uL (ref 3.87–5.11)
RDW: 13.4 % (ref 11.5–15.5)
WBC: 1.8 10*3/uL — ABNORMAL LOW (ref 4.0–10.5)
nRBC: 0 % (ref 0.0–0.2)

## 2022-02-02 MED ORDER — HEPARIN SOD (PORK) LOCK FLUSH 100 UNIT/ML IV SOLN
500.0000 [IU] | Freq: Once | INTRAVENOUS | Status: AC
  Administered 2022-02-02: 500 [IU] via INTRAVENOUS
  Filled 2022-02-02: qty 5

## 2022-02-02 MED ORDER — SODIUM CHLORIDE 0.9% FLUSH
10.0000 mL | Freq: Once | INTRAVENOUS | Status: AC
  Administered 2022-02-02: 10 mL via INTRAVENOUS
  Filled 2022-02-02: qty 10

## 2022-02-03 ENCOUNTER — Ambulatory Visit
Admission: RE | Admit: 2022-02-03 | Discharge: 2022-02-03 | Disposition: A | Discharge status: Home / Self Care | Source: Ambulatory Visit | Attending: Radiation Oncology | Admitting: Radiation Oncology

## 2022-02-03 DIAGNOSIS — Z510 Encounter for antineoplastic radiation therapy: Secondary | ICD-10-CM | POA: Diagnosis not present

## 2022-02-04 ENCOUNTER — Ambulatory Visit
Admission: RE | Admit: 2022-02-04 | Discharge: 2022-02-04 | Disposition: A | Discharge status: Home / Self Care | Source: Ambulatory Visit | Attending: Radiation Oncology | Admitting: Radiation Oncology

## 2022-02-04 DIAGNOSIS — Z510 Encounter for antineoplastic radiation therapy: Secondary | ICD-10-CM | POA: Diagnosis not present

## 2022-02-07 ENCOUNTER — Ambulatory Visit
Admission: RE | Admit: 2022-02-07 | Discharge: 2022-02-07 | Disposition: A | Discharge status: Home / Self Care | Source: Ambulatory Visit | Attending: Radiation Oncology | Admitting: Radiation Oncology

## 2022-02-07 DIAGNOSIS — Z510 Encounter for antineoplastic radiation therapy: Secondary | ICD-10-CM | POA: Diagnosis not present

## 2022-02-08 ENCOUNTER — Ambulatory Visit
Admission: RE | Admit: 2022-02-08 | Discharge: 2022-02-08 | Disposition: A | Discharge status: Home / Self Care | Source: Ambulatory Visit | Attending: Radiation Oncology | Admitting: Radiation Oncology

## 2022-02-08 DIAGNOSIS — Z510 Encounter for antineoplastic radiation therapy: Secondary | ICD-10-CM | POA: Diagnosis not present

## 2022-02-09 ENCOUNTER — Ambulatory Visit
Admission: RE | Admit: 2022-02-09 | Discharge: 2022-02-09 | Disposition: A | Discharge status: Home / Self Care | Source: Ambulatory Visit | Attending: Radiation Oncology | Admitting: Radiation Oncology

## 2022-02-09 DIAGNOSIS — Z510 Encounter for antineoplastic radiation therapy: Secondary | ICD-10-CM | POA: Diagnosis not present

## 2022-02-10 ENCOUNTER — Ambulatory Visit
Admission: RE | Admit: 2022-02-10 | Discharge: 2022-02-10 | Disposition: A | Discharge status: Home / Self Care | Source: Ambulatory Visit | Attending: Radiation Oncology | Admitting: Radiation Oncology

## 2022-02-10 DIAGNOSIS — Z510 Encounter for antineoplastic radiation therapy: Secondary | ICD-10-CM | POA: Diagnosis not present

## 2022-02-11 ENCOUNTER — Ambulatory Visit
Admission: RE | Admit: 2022-02-11 | Discharge: 2022-02-11 | Disposition: A | Discharge status: Home / Self Care | Source: Ambulatory Visit | Attending: Radiation Oncology | Admitting: Radiation Oncology

## 2022-02-11 DIAGNOSIS — Z510 Encounter for antineoplastic radiation therapy: Secondary | ICD-10-CM | POA: Diagnosis not present

## 2022-02-14 ENCOUNTER — Ambulatory Visit
Admission: RE | Admit: 2022-02-14 | Discharge: 2022-02-14 | Disposition: A | Discharge status: Home / Self Care | Source: Ambulatory Visit | Attending: Radiation Oncology | Admitting: Radiation Oncology

## 2022-02-14 ENCOUNTER — Other Ambulatory Visit: Payer: Self-pay | Admitting: *Deleted

## 2022-02-14 DIAGNOSIS — Z510 Encounter for antineoplastic radiation therapy: Secondary | ICD-10-CM | POA: Diagnosis not present

## 2022-02-14 MED ORDER — SILVER SULFADIAZINE 1 % EX CREA: 1.0000 "application " | TOPICAL_CREAM | Freq: Two times a day (BID) | CUTANEOUS | 3 refills | Status: DC

## 2022-02-15 ENCOUNTER — Ambulatory Visit
Admission: RE | Admit: 2022-02-15 | Discharge: 2022-02-15 | Disposition: A | Discharge status: Home / Self Care | Source: Ambulatory Visit | Attending: Radiation Oncology | Admitting: Radiation Oncology

## 2022-02-15 DIAGNOSIS — Z510 Encounter for antineoplastic radiation therapy: Secondary | ICD-10-CM | POA: Diagnosis not present

## 2022-02-16 ENCOUNTER — Inpatient Hospital Stay

## 2022-02-16 ENCOUNTER — Other Ambulatory Visit: Payer: Self-pay

## 2022-02-16 ENCOUNTER — Ambulatory Visit
Admission: RE | Admit: 2022-02-16 | Discharge: 2022-02-16 | Disposition: A | Discharge status: Home / Self Care | Source: Ambulatory Visit | Attending: Radiation Oncology | Admitting: Radiation Oncology

## 2022-02-16 DIAGNOSIS — Z510 Encounter for antineoplastic radiation therapy: Secondary | ICD-10-CM | POA: Insufficient documentation

## 2022-02-16 DIAGNOSIS — T451X5D Adverse effect of antineoplastic and immunosuppressive drugs, subsequent encounter: Secondary | ICD-10-CM | POA: Insufficient documentation

## 2022-02-16 DIAGNOSIS — G620 Drug-induced polyneuropathy: Secondary | ICD-10-CM | POA: Insufficient documentation

## 2022-02-16 DIAGNOSIS — Z803 Family history of malignant neoplasm of breast: Secondary | ICD-10-CM | POA: Insufficient documentation

## 2022-02-16 DIAGNOSIS — Z79899 Other long term (current) drug therapy: Secondary | ICD-10-CM | POA: Insufficient documentation

## 2022-02-16 DIAGNOSIS — Z171 Estrogen receptor negative status [ER-]: Secondary | ICD-10-CM | POA: Insufficient documentation

## 2022-02-16 DIAGNOSIS — R232 Flushing: Secondary | ICD-10-CM | POA: Insufficient documentation

## 2022-02-16 DIAGNOSIS — Z923 Personal history of irradiation: Secondary | ICD-10-CM | POA: Insufficient documentation

## 2022-02-16 DIAGNOSIS — Z801 Family history of malignant neoplasm of trachea, bronchus and lung: Secondary | ICD-10-CM | POA: Insufficient documentation

## 2022-02-16 DIAGNOSIS — C50412 Malignant neoplasm of upper-outer quadrant of left female breast: Secondary | ICD-10-CM | POA: Insufficient documentation

## 2022-02-16 DIAGNOSIS — Z9221 Personal history of antineoplastic chemotherapy: Secondary | ICD-10-CM | POA: Insufficient documentation

## 2022-02-16 LAB — CBC
HCT: 39.9 % (ref 36.0–46.0)
Hemoglobin: 12.5 g/dL (ref 12.0–15.0)
MCH: 27.7 pg (ref 26.0–34.0)
MCHC: 31.3 g/dL (ref 30.0–36.0)
MCV: 88.5 fL (ref 80.0–100.0)
Platelets: 175 10*3/uL (ref 150–400)
RBC: 4.51 MIL/uL (ref 3.87–5.11)
RDW: 13.4 % (ref 11.5–15.5)
WBC: 3.7 10*3/uL — ABNORMAL LOW (ref 4.0–10.5)
nRBC: 0 % (ref 0.0–0.2)

## 2022-02-17 ENCOUNTER — Ambulatory Visit
Admission: RE | Admit: 2022-02-17 | Discharge: 2022-02-17 | Disposition: A | Discharge status: Home / Self Care | Source: Ambulatory Visit | Attending: Radiation Oncology | Admitting: Radiation Oncology

## 2022-02-17 DIAGNOSIS — Z510 Encounter for antineoplastic radiation therapy: Secondary | ICD-10-CM | POA: Diagnosis not present

## 2022-02-21 ENCOUNTER — Other Ambulatory Visit: Payer: Self-pay | Admitting: *Deleted

## 2022-03-11 ENCOUNTER — Inpatient Hospital Stay

## 2022-03-11 ENCOUNTER — Other Ambulatory Visit: Payer: Self-pay

## 2022-03-11 ENCOUNTER — Inpatient Hospital Stay (HOSPITAL_BASED_OUTPATIENT_CLINIC_OR_DEPARTMENT_OTHER): Admitting: Oncology

## 2022-03-11 ENCOUNTER — Encounter: Payer: Self-pay | Admitting: Oncology

## 2022-03-11 VITALS — BP 125/83 | HR 84 | Temp 97.1°F | Resp 18 | Wt 237.8 lb

## 2022-03-11 DIAGNOSIS — D0512 Intraductal carcinoma in situ of left breast: Secondary | ICD-10-CM

## 2022-03-11 DIAGNOSIS — Z95828 Presence of other vascular implants and grafts: Secondary | ICD-10-CM | POA: Diagnosis not present

## 2022-03-11 DIAGNOSIS — C50412 Malignant neoplasm of upper-outer quadrant of left female breast: Secondary | ICD-10-CM

## 2022-03-11 DIAGNOSIS — Z171 Estrogen receptor negative status [ER-]: Secondary | ICD-10-CM

## 2022-03-11 DIAGNOSIS — Z510 Encounter for antineoplastic radiation therapy: Secondary | ICD-10-CM | POA: Diagnosis not present

## 2022-03-11 DIAGNOSIS — G620 Drug-induced polyneuropathy: Secondary | ICD-10-CM

## 2022-03-11 DIAGNOSIS — T451X5A Adverse effect of antineoplastic and immunosuppressive drugs, initial encounter: Secondary | ICD-10-CM

## 2022-03-11 LAB — CBC
HCT: 38.9 % (ref 36.0–46.0)
Hemoglobin: 12.4 g/dL (ref 12.0–15.0)
MCH: 27.4 pg (ref 26.0–34.0)
MCHC: 31.9 g/dL (ref 30.0–36.0)
MCV: 86.1 fL (ref 80.0–100.0)
Platelets: 176 10*3/uL (ref 150–400)
RBC: 4.52 MIL/uL (ref 3.87–5.11)
RDW: 14 % (ref 11.5–15.5)
WBC: 2.4 10*3/uL — ABNORMAL LOW (ref 4.0–10.5)
nRBC: 0 % (ref 0.0–0.2)

## 2022-03-11 MED ORDER — LETROZOLE 2.5 MG PO TABS: 2.5000 mg | ORAL_TABLET | Freq: Every day | ORAL | 3 refills | Status: DC

## 2022-03-11 MED ORDER — SODIUM CHLORIDE 0.9% FLUSH
10.0000 mL | Freq: Once | INTRAVENOUS | Status: AC
  Administered 2022-03-11: 10 mL via INTRAVENOUS
  Filled 2022-03-11: qty 10

## 2022-03-11 MED ORDER — HEPARIN SOD (PORK) LOCK FLUSH 100 UNIT/ML IV SOLN
500.0000 [IU] | Freq: Once | INTRAVENOUS | Status: AC
  Administered 2022-03-11: 500 [IU] via INTRAVENOUS
  Filled 2022-03-11: qty 5

## 2022-03-11 NOTE — Progress Notes (Signed)
?Hematology/Oncology progress note ?[phone] [phone] ? ? ?Patient Care Team: ?Renee Rival, NP as PCP - General (Nurse Practitioner) ? 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 ?HISTORY OF PRESENTING ILLNESS:  ? ?Peggy Bates is a  56 y.o.  female with PMH listed below was seen in consultation at the request of  Renee Rival, NP  for evaluation of triple negative breast cancer. ? ?03/30/2021, screening mammogram with 3D ?2 nodular areas of asymmetric density demonstrated within the superior lateral aspect of the left breast middle third depth.  Finding was best appreciated on 3D tomosynthesis imaging. ?Targeted ultrasound showed left upper outer breast 1:30 position 1.1 cm round hypoechoic mass with angular margins. ?04/21/2021  biopsy of the breast mass ?Pathology showed infiltrating ductal carcinoma, grade 3, Ki-67 70%, ER negative, PR negative, HER2 negative, ? ?Patient has met surgery Dr. Bary Castilla yesterday.  She presents to establish care with me today ?Patient did not notice this left mass prior to the mammogram.  ? ?Case was discussed on Tumor board.  ?Pathology reports and mammogram/ultrasound were done at outside facility and results were reviewed and discussed with patient and her husband.  LN status was not mentioned on her [place]. Recommend additional images with mammogram and MRI, neoadjuvant chemotherapy ? ?Family history of breast cancer: Breast cancer in her sister, her late 42s, early 34s; and being paternal grandmother ?Menarche: 50 ?Age at first live childbirth: 84  ?patient has 2 sons and 1 daughter.  Daughter has passed away ?Used OCP:  ?Used estrogen and progesterone therapy: Denies ?History of Radiation to the chest: Denies ?Previous of breast biopsy: Denies ? ?05/12/2021 diagnostic mammogram of left breast showed 2.1 cm biopsy-proven malignancy in the lateral left  breast at 2:00, 4 cm from nipple.  Directly adjacent cystic mass, 1.5 cm in size, consistent with biopsy hematoma.  1 abnormal and 1 borderline abnormal left axillary lymph node  ?05/18/2021 bilateral breast MRI with and without contrast showed 2.5 x 3 x 2.5 cm hematoma containing biopsy clip artifact within the upper outer left breast.  1.6 cm nodular non-mass-like enhancement 2.5 cm posterior/superior to the biopsy clip may represent biopsy-proven malignancy or additional areas of malignancy.  Single abnormal appearing left axillary lymph node with eccentric cortical thickening.  No MRI evidence of the right breast malignancy. ? ?#05/07/2021, Mediport was placed by Dr. Bary Castilla ?#05/14/2021 echocardiogram showed LVEF 60-55% ? ?05/20/2021 Slide consultation of her left breast biopsy from outside institution - invasive mammary carcinoma, no special type, grade 3, DCIS and LVI not identified. ?- ER negative, PR negative, HER2 IHC negative. Ki 67 70-80%   ? ?05/20/2021 ultrasound-guided left axillary lymph node was negative for malignancy.  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 ?05/26/2021, genetic testing-[facility] negative ?06/08/2021-10/29/2019 cycle 1 day 1 pembrolizumab/carboplatin Q 3 weeks / weekly Taxol followed by ddAC x4 with G-CSF support ? ?08/30/2021 MRI breast showed no abnormal enhancement remains in the left breast. Complete response to chemotherapy is identified. No MRI evidence of malignancy in either breast. ?11/15/2021 bilateral MRI breast showed no abnormal enhancement is identified in bilateral breast. ?12/10/2021, patient underwent left upper outer lumpectomy with sentinel lymph node biopsy.  Residual DCIS, high-grade, 4 sentinel lymph nodes were harvested and all negative for malignancy.  Margins are negative for DCIS, closest margin is greater than 5 mm. ypTis ypN0. 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 ?INTERVAL HISTORY ?Peggy Bates is a 56  y.o. female who has above history reviewed by me today presents for follow up visit for management of left triple negative breast cancer ?01/04/2022 - 02/10/2022 patient completed adjuvant radiation. ? ?Today patient was seen in the clinic for evaluation and discussion of adjuvant endocrine therapy.  She has no new complaints.  Feeling well today. ? ?. ?Review of Systems  ?Constitutional:  Positive for fatigue. Negative for appetite change, chills and fever.  ?HENT:   Negative for hearing loss and voice change.   ?Eyes:  Negative for eye problems.  ?Respiratory:  Negative for chest tightness and cough.   ?Cardiovascular:  Negative for chest pain.  ?Gastrointestinal:  Negative for abdominal distention, abdominal pain and blood in stool.  ?Endocrine: Negative for hot flashes.  ?Genitourinary:  Negative for difficulty urinating and frequency.   ?Musculoskeletal:  Negative for arthralgias.  ?Skin:  Negative for itching and rash.  ?Neurological:  Positive for numbness. Negative for extremity weakness.  ?Hematological:  Negative for adenopathy.  ?Psychiatric/Behavioral:  Negative for confusion.   ? ?MEDICAL HISTORY:  ?Past Medical History:  ?Diagnosis Date  ? Arthritis   ? knees  ? COVID-19 12/2020  ? Depression   ? Family history of breast cancer   ? Goiter   ? Malignant neoplasm of left female breast ([facility]) 04/07/2021  ? ? ?SURGICAL HISTORY: ?Past Surgical History:  ?Procedure Laterality Date  ? ADJACENT TISSUE TRANSFER/TISSUE REARRANGEMENT Left 12/10/2021  ? Procedure: ADJACENT TISSUE TRANSFER/TISSUE REARRANGEMENT;  Surgeon: Robert Bellow, MD;  Location: [facility];  Service: General;  Laterality: Left;  ? AXILLARY SENTINEL NODE BIOPSY Left 12/10/2021  ? Procedure: AXILLARY SENTINEL NODE BIOPSY;  Surgeon: Robert Bellow, MD;  Location: [facility];  Service: General;  Laterality: Left;  ? BREAST BIOPSY Left 04/07/2021  ? [facility] in [place]:u/s bx triple neg  ? BREAST BIOPSY Left 05/20/2021  ? [place] Axilla  Bx, hydro 3 marker, path pending   ? BREAST LUMPECTOMY WITH NEEDLE LOCALIZATION Left 12/10/2021  ? Procedure: BREAST LUMPECTOMY WITH NEEDLE LOCALIZATION, PECTORAL BLOCK;  Surgeon: Robert Bellow, MD;  Location: [facility];  Service: General;  Laterality: Left;  ? KNEE ARTHROSCOPY Right 2008  ? PORTACATH PLACEMENT Right 05/07/2021  ? Procedure: INSERTION PORT-A-CATH;  Surgeon: Robert Bellow, MD;  Location: [facility];  Service: General;  Laterality: Right;  ? TOOTH EXTRACTION    ? TRANSCERVICAL UTERINE FIBROID(S) ABLATION  2010  ? ? ?SOCIAL HISTORY: ?Social History  ? ?Socioeconomic History  ? Marital status: Married  ?  Spouse name: Not on file  ? Number of children: Not on file  ? Years of education: Not on file  ? Highest education level: Not on file  ?Occupational History  ? Not on file  ?Tobacco Use  ? Smoking status: Never  ? Smokeless tobacco: Never  ?Vaping Use  ? Vaping Use: Never used  ?Substance and Sexual Activity  ? Alcohol use: Never  ? Drug use: Never  ? Sexual activity: Yes  ?Other Topics Concern  ? Not on file  ?Social History Narrative  ? ** Merged History Encounter **  ?    ? ?Social Determinants of Health  ? ?Financial Resource Strain: Not on file  ?Food Insecurity: Not on file  ?Transportation Needs: Not on file  ?Physical Activity: Not on file  ?Stress: Not on file  ?Social Connections: Not on file  ?Intimate Partner Violence: Not on file  ? ? ?  FAMILY HISTORY: ?Family History  ?Problem Relation Age of Onset  ? Breast cancer Paternal Grandmother   ? Breast cancer Sister   ?     dx 62s, recurrence x3  ? Diabetes Sister   ? Diabetes Father   ? Throat cancer Maternal Grandmother   ? ? ?ALLERGIES:  has No Known Allergies. ? ?MEDICATIONS:  ?Current Outpatient Medications  ?Medication Sig Dispense Refill  ? acetaminophen (TYLENOL) 650 MG CR tablet Take 1,300 mg by mouth See admin instructions. Take 1300 mg in the morning may take a second 1300 mg dose during the day as needed for pain    ?  cholecalciferol (VITAMIN D) 25 MCG (1000 UNIT) tablet Take 1,000 Units by mouth daily.    ? diclofenac Sodium (VOLTAREN) 1 % GEL Apply 1 application topically daily.    ? gabapentin (NEURONTIN) 600 MG tablet [place]

## 2022-03-11 NOTE — Progress Notes (Signed)
Pt here for follow up. No new concerns voiced.   

## 2022-03-23 ENCOUNTER — Ambulatory Visit
Admission: RE | Admit: 2022-03-23 | Discharge: 2022-03-23 | Disposition: A | Discharge status: Home / Self Care | Source: Ambulatory Visit | Attending: Radiation Oncology | Admitting: Radiation Oncology

## 2022-03-23 ENCOUNTER — Other Ambulatory Visit

## 2022-03-23 ENCOUNTER — Ambulatory Visit: Admitting: Oncology

## 2022-03-23 VITALS — BP 105/84 | HR 85 | Temp 97.4°F | Resp 18 | Ht 67.0 in | Wt 239.5 lb

## 2022-03-23 DIAGNOSIS — Z170 Estrogen receptor positive status [ER+]: Secondary | ICD-10-CM | POA: Insufficient documentation

## 2022-03-23 DIAGNOSIS — C50412 Malignant neoplasm of upper-outer quadrant of left female breast: Secondary | ICD-10-CM | POA: Diagnosis present

## 2022-03-23 DIAGNOSIS — Z923 Personal history of irradiation: Secondary | ICD-10-CM | POA: Diagnosis not present

## 2022-03-23 DIAGNOSIS — Z79811 Long term (current) use of aromatase inhibitors: Secondary | ICD-10-CM | POA: Diagnosis not present

## 2022-03-23 DIAGNOSIS — Z171 Estrogen receptor negative status [ER-]: Secondary | ICD-10-CM

## 2022-03-23 DIAGNOSIS — M2550 Pain in unspecified joint: Secondary | ICD-10-CM | POA: Diagnosis not present

## 2022-03-23 NOTE — Progress Notes (Signed)
Radiation Oncology ?Follow up Note ? ?Name: Peggy Bates   ?Date:   03/23/2022 ?MRN:  [id number] ?DOB: 12-14-1966  ? ? ?This 56 y.o. female presents to the clinic today for 1 month follow-up status post whole breast radiation to her left breast for triple negative stage IIa invasive mammary carcinoma status post neoadjuvant chemotherapy followed by wide local excision. ? ?REFERRING PROVIDER: Renee Rival, NP ? ?HPI: Patient is a 56 year old female now at 1 month having completed whole breast radiation to her left breast for stage IIa (T2 N0 M0) grade 3 triple negative invasive mammary carcinoma status post neoadjuvant chemotherapy and wide local excision.  Seen today in routine follow-up she is doing well.  She has been started on.  Femara which is causing some exacerbation of joint pain although she continues to walk every day.  She specifically denies breast tenderness cough or bone pain. ? ?COMPLICATIONS OF TREATMENT: none ? ?FOLLOW UP COMPLIANCE: keeps appointments  ? ?PHYSICAL EXAM:  ?BP 105/84   Pulse 85   Temp (!) 97.4 ?F (36.3 ?C)   Resp 18   Ht '5\' 7"'$  (1.702 m)   Wt 239 lb 8 oz (108.6 kg)   LMP  (LMP Unknown)   BMI 37.51 kg/m?  ?Left breast is still somewhat hyperpigmented with 1 small area of dry desquamation noted.  No dominant masses noted in either breast.  No axillary or supraclavicular adenopathy is appreciated.  Well-developed well-nourished patient in NAD. HEENT reveals PERLA, EOMI, discs not visualized.  Oral cavity is clear. No oral mucosal lesions are identified. Neck is clear without evidence of cervical or supraclavicular adenopathy. Lungs are clear to A&P. Cardiac examination is essentially unremarkable with regular rate and rhythm without murmur rub or thrill. Abdomen is benign with no organomegaly or masses noted. Motor sensory and DTR levels are equal and symmetric in the upper and lower extremities. Cranial nerves II through XII are grossly intact. Proprioception is intact. No  peripheral adenopathy or edema is identified. No motor or sensory levels are noted. Crude visual fields are within normal range. ? ?RADIOLOGY RESULTS: No current films for review ? ?PLAN: The present time patient is doing well 1 month out from whole breast radiation and pleased with her overall progress.  I have asked to see her back in 4 to 5 months for follow-up.  Patient is to call with any concerns. ? ?I would like to take this opportunity to thank you for allowing me to participate in the care of your patient.. ?  ? Noreene Filbert, MD ? ?

## 2022-05-12 ENCOUNTER — Ambulatory Visit
Admission: RE | Admit: 2022-05-12 | Discharge: 2022-05-12 | Disposition: A | Discharge status: Home / Self Care | Source: Ambulatory Visit | Attending: Oncology | Admitting: Oncology

## 2022-05-12 ENCOUNTER — Encounter: Payer: Self-pay | Admitting: Radiology

## 2022-05-12 DIAGNOSIS — C50412 Malignant neoplasm of upper-outer quadrant of left female breast: Secondary | ICD-10-CM

## 2022-05-12 DIAGNOSIS — Z171 Estrogen receptor negative status [ER-]: Secondary | ICD-10-CM | POA: Diagnosis present

## 2022-05-12 HISTORY — DX: Personal history of irradiation: Z92.3

## 2022-05-12 HISTORY — DX: Personal history of antineoplastic chemotherapy: Z92.21

## 2022-05-12 HISTORY — DX: Malignant neoplasm of unspecified site of unspecified female breast: C50.919

## 2022-05-12 IMAGING — US US BREAST*L* LIMITED INC AXILLA
1 series · 8 of 8 positions shown · non-contrast
Comparison: Previous exam(s).

CLINICAL DATA: LEFT lumpectomy [DATE]. Reportedly recent
aspiration of a LEFT seroma. Patient is status post neoadjuvant
chemotherapy and radiation.

EXAM:
DIGITAL DIAGNOSTIC BILATERAL MAMMOGRAM WITH TOMOSYNTHESIS AND CAD;
ULTRASOUND LEFT BREAST LIMITED
TECHNIQUE: Bilateral digital diagnostic mammography and breast tomosynthesis
was performed. The images were evaluated with computer-aided
detection.; Targeted ultrasound examination of the left breast was
performed.

[Series 1: us breast*left* limited inc axilla · 0.06mm/px · 8 of 8 slices shown]
[im 1/8]
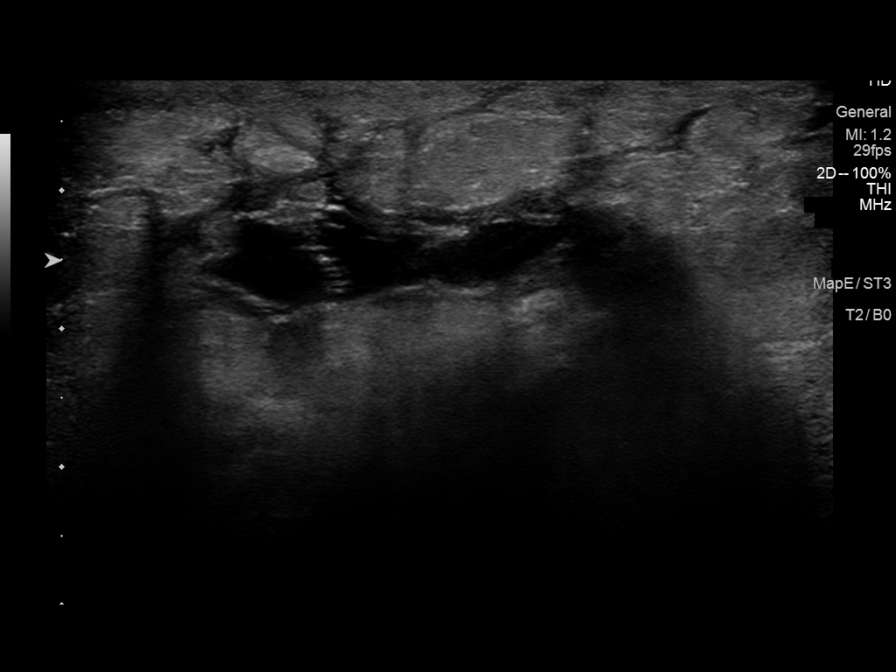
[im 2/8]
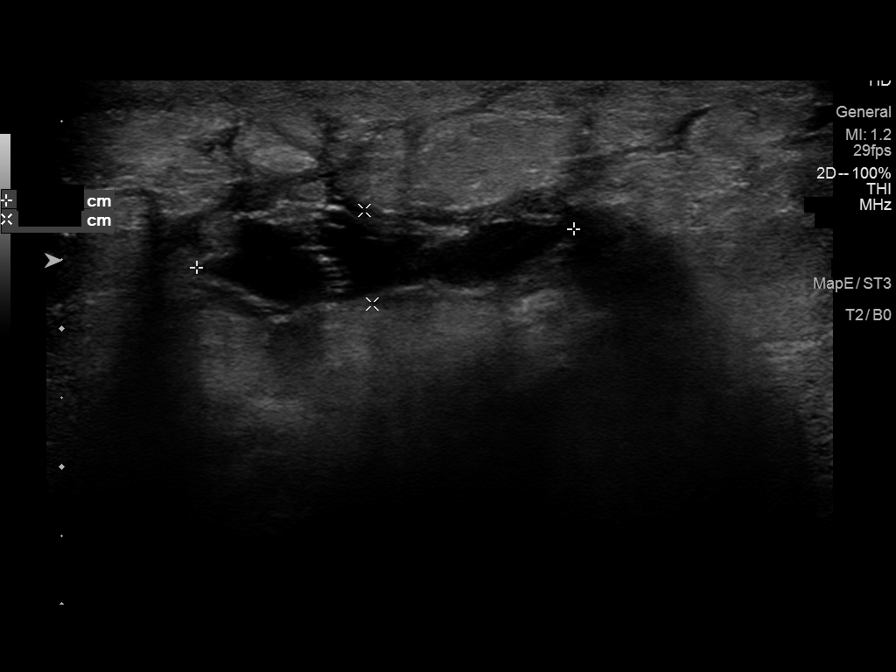
[im 3/8]
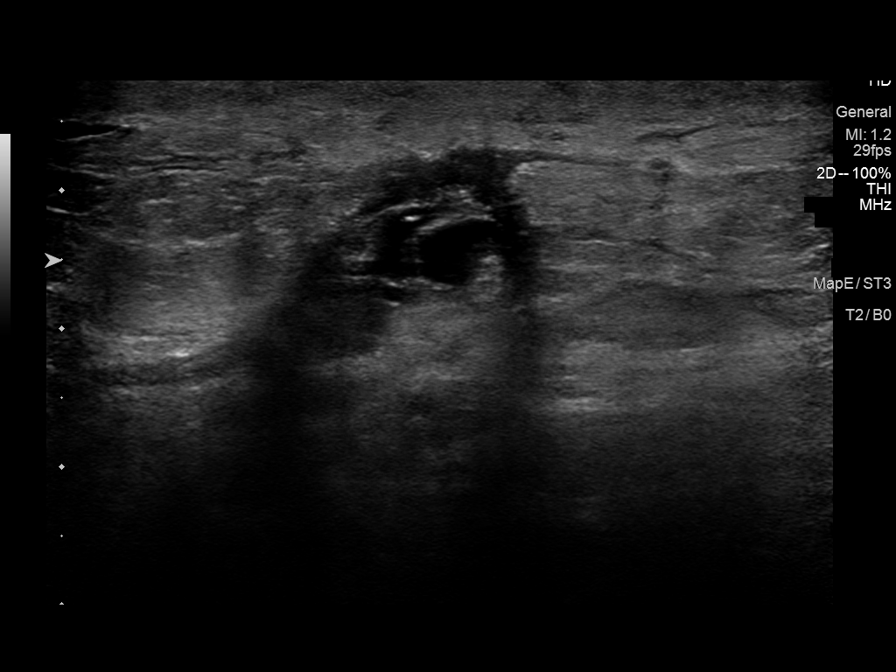
[im 4/8]
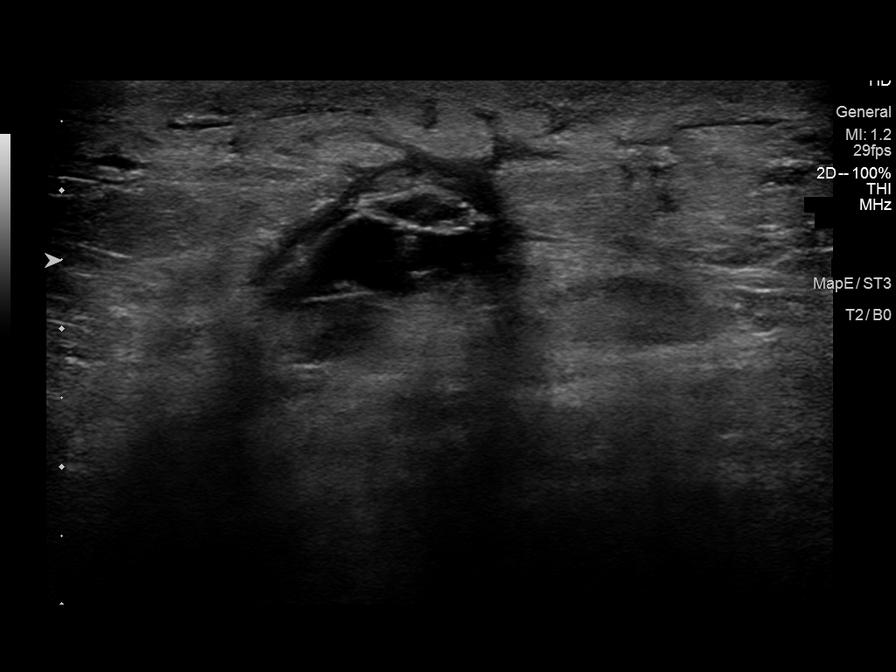
[im 5/8]
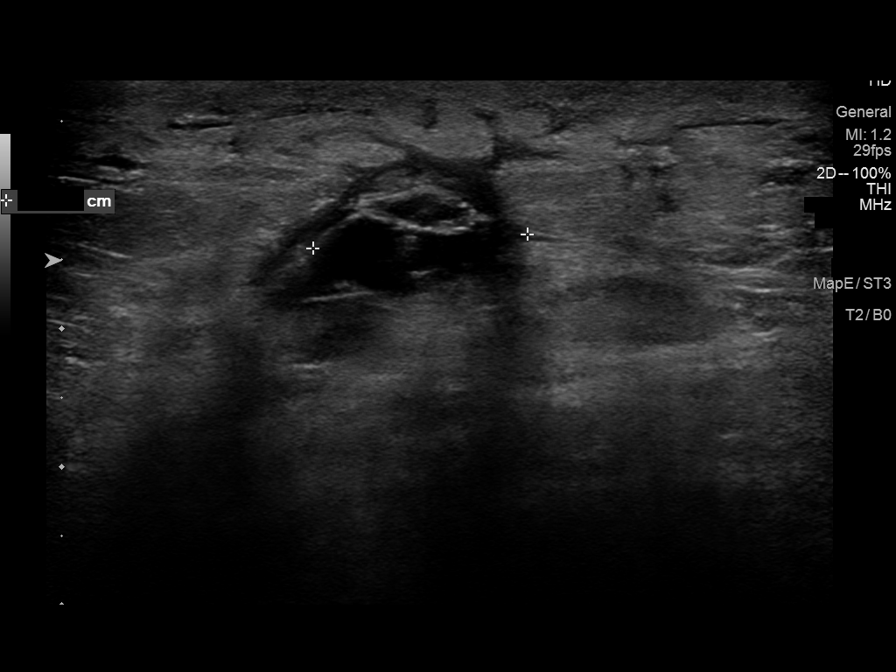
[im 6/8]
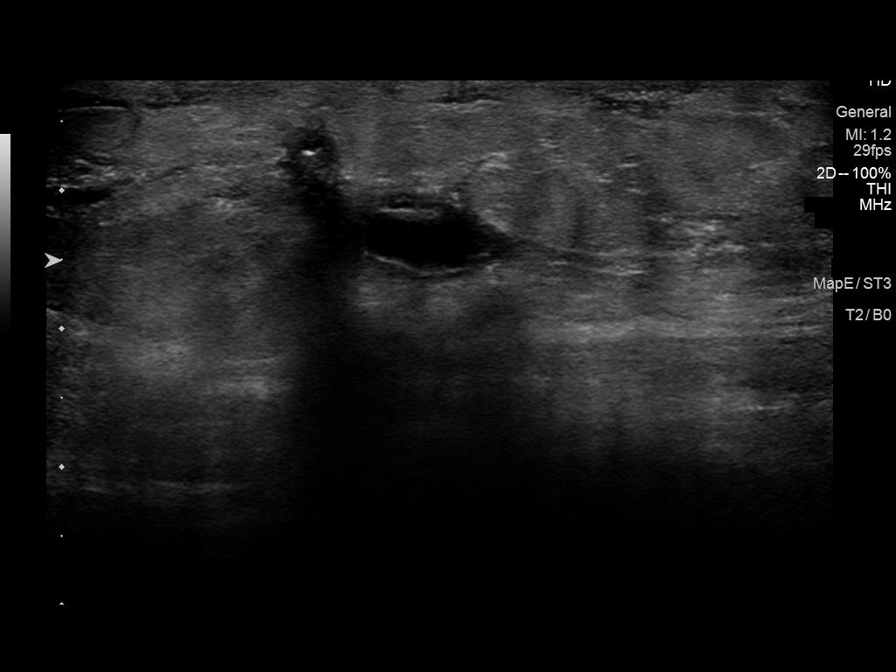
[im 7/8]
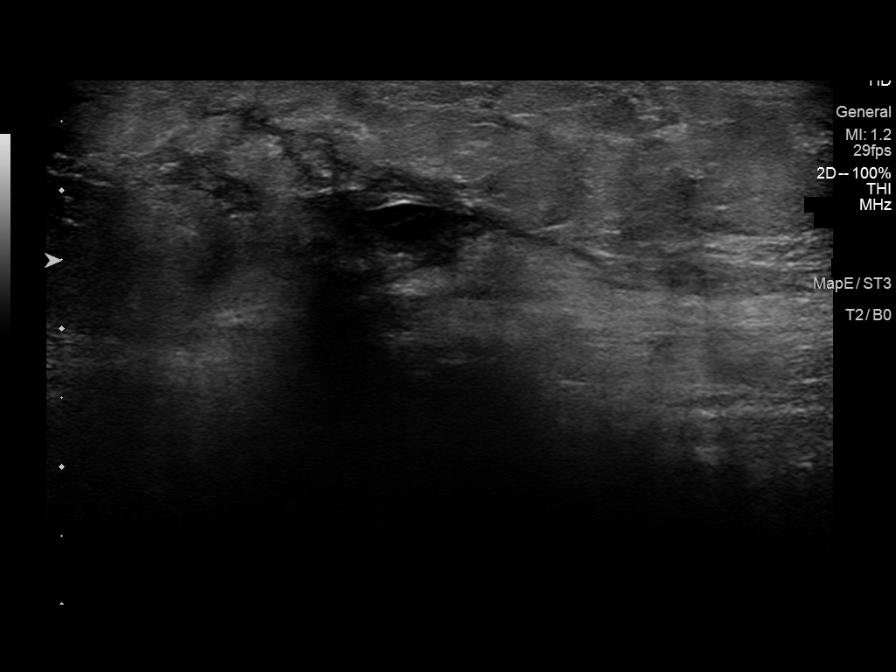
[im 8/8]
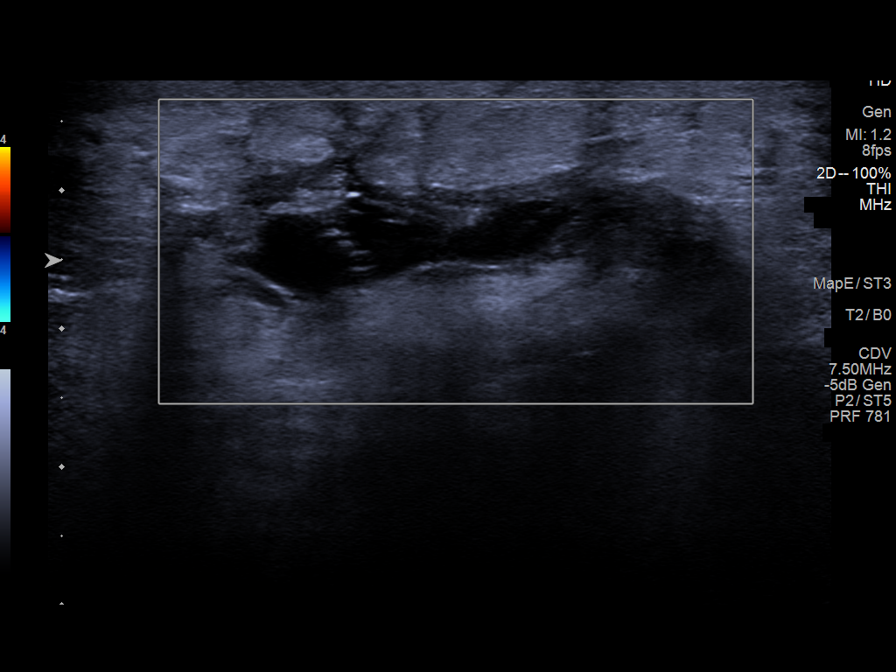

[8 of 8 positions shown; findings below may reference images not displayed]

ACR Breast Density Category b: There are scattered areas of
fibroglandular density.
FINDINGS: There is density and architectural distortion within the LEFT
breast, consistent with postsurgical changes. These are new in
comparison to prior. There is a homogeneous 4 cm masslike opacity in
the LEFT upper outer breast at posterior depth likely reflecting a
seroma. RIGHT chest port. No suspicious mass, distortion, or
microcalcifications are identified to suggest presence of malignancy
in the RIGHT breast.

Targeted ultrasound performed of the LEFT upper outer breast. At 2
o'clock 7 cm from the nipple, there is a heterogeneous fluid
collection which measures 2.7 by 0.7 by 1.6 cm. This is consistent
with a seroma.
IMPRESSION: No mammographic evidence of malignancy.

RECOMMENDATION:
Bilateral diagnostic mammogram in 1 year is recommended.

Recommend supplemental breast screening with breast MRI given young
age at diagnosis.

I have discussed the findings and recommendations with the patient.
If applicable, a reminder letter will be sent to the patient
regarding the next appointment.

BI-RADS CATEGORY  2: Benign.

## 2022-05-12 IMAGING — MG DIGITAL DIAGNOSTIC BILAT W/ TOMO W/ CAD
6 of 11 series · 6 of 31 positions shown · non-contrast
Comparison: Previous exam(s).

CLINICAL DATA: LEFT lumpectomy [DATE]. Reportedly recent
aspiration of a LEFT seroma. Patient is status post neoadjuvant
chemotherapy and radiation.

EXAM:
DIGITAL DIAGNOSTIC BILATERAL MAMMOGRAM WITH TOMOSYNTHESIS AND CAD;
ULTRASOUND LEFT BREAST LIMITED
TECHNIQUE: Bilateral digital diagnostic mammography and breast tomosynthesis
was performed. The images were evaluated with computer-aided
detection.; Targeted ultrasound examination of the left breast was
performed.

[L XCCL]
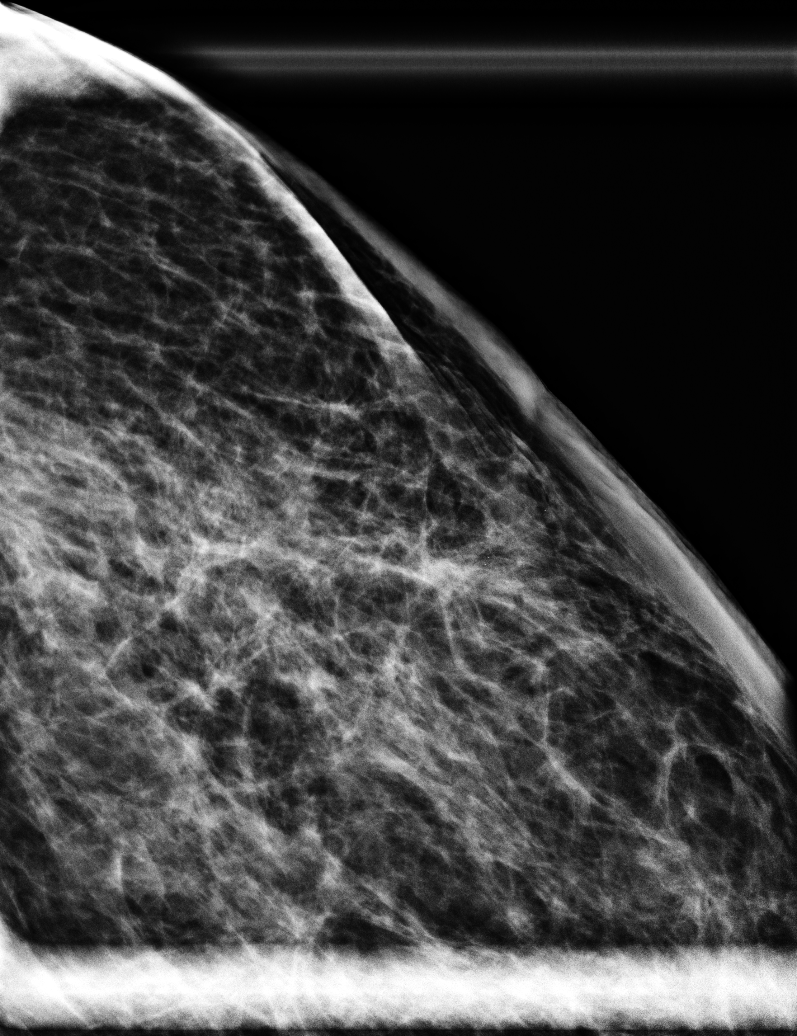

[L MLO synth-2D (1 of 2)]
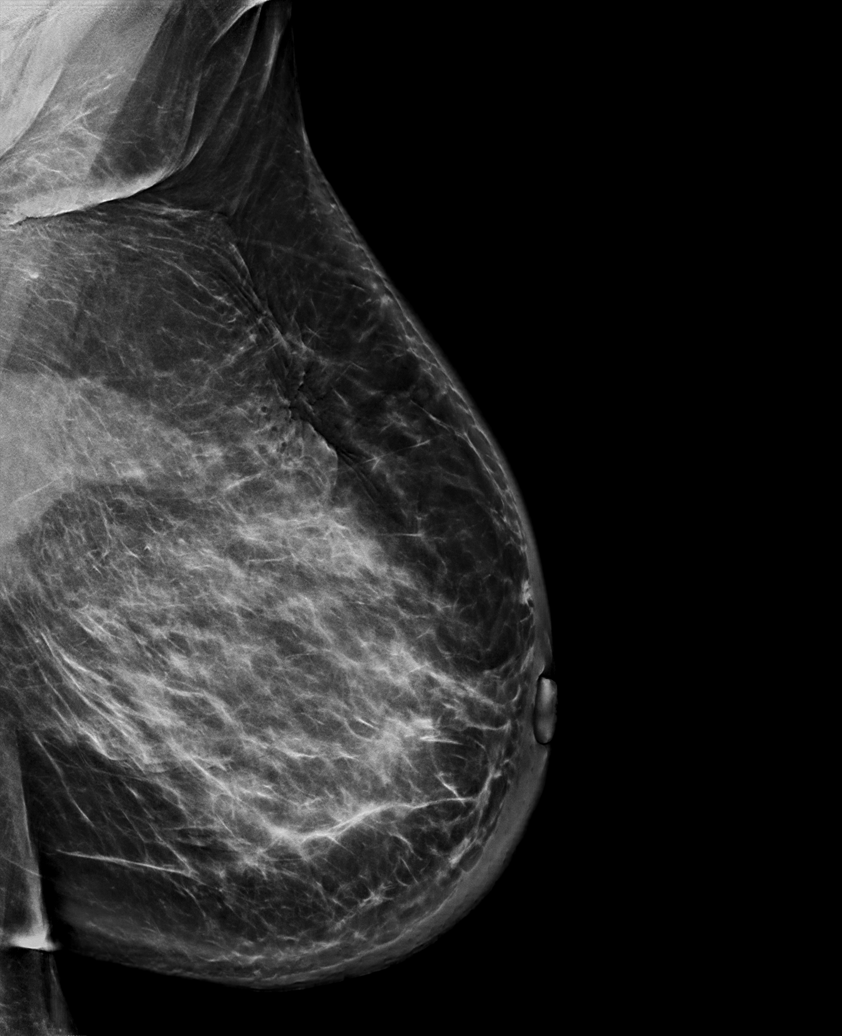

[R MLO synth-2D]
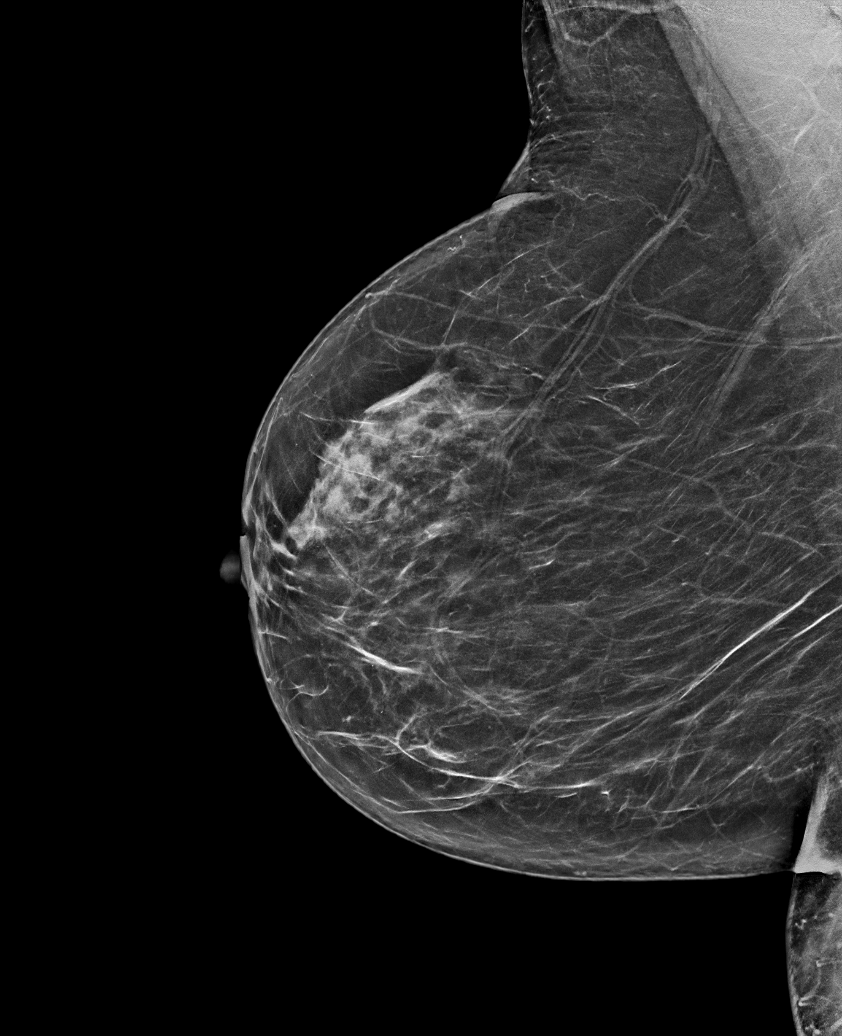

[L CC synth-2D]
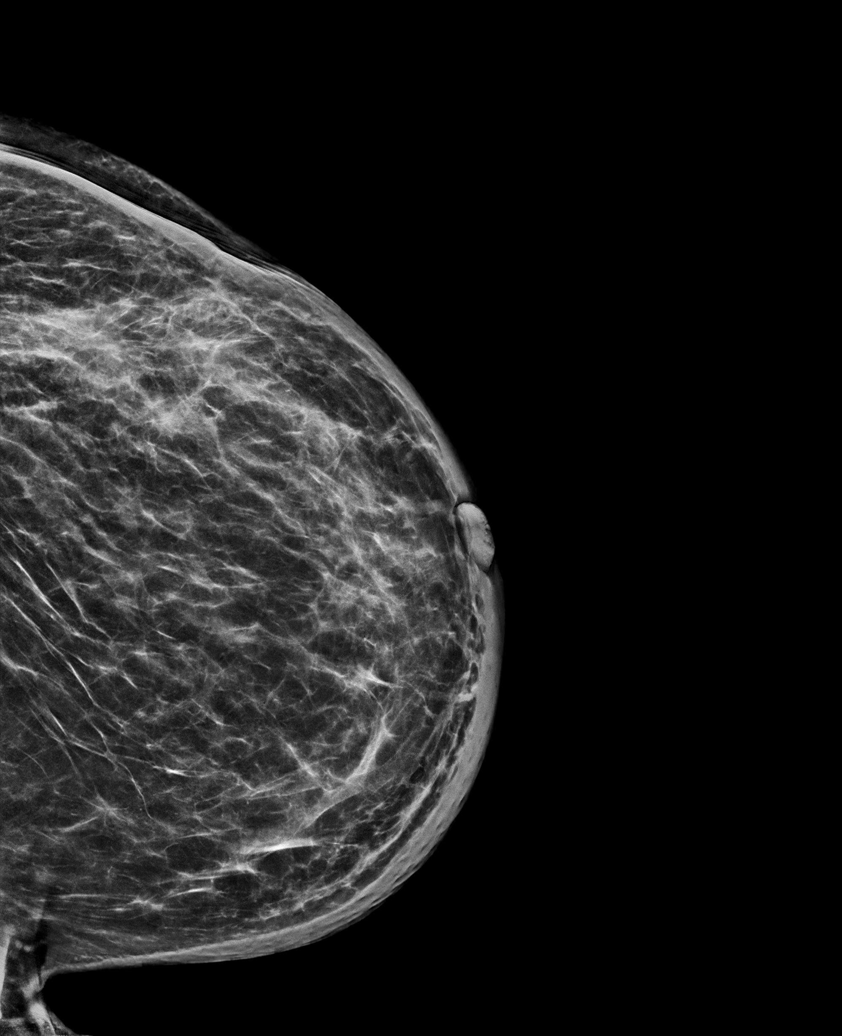

[R CC synth-2D]
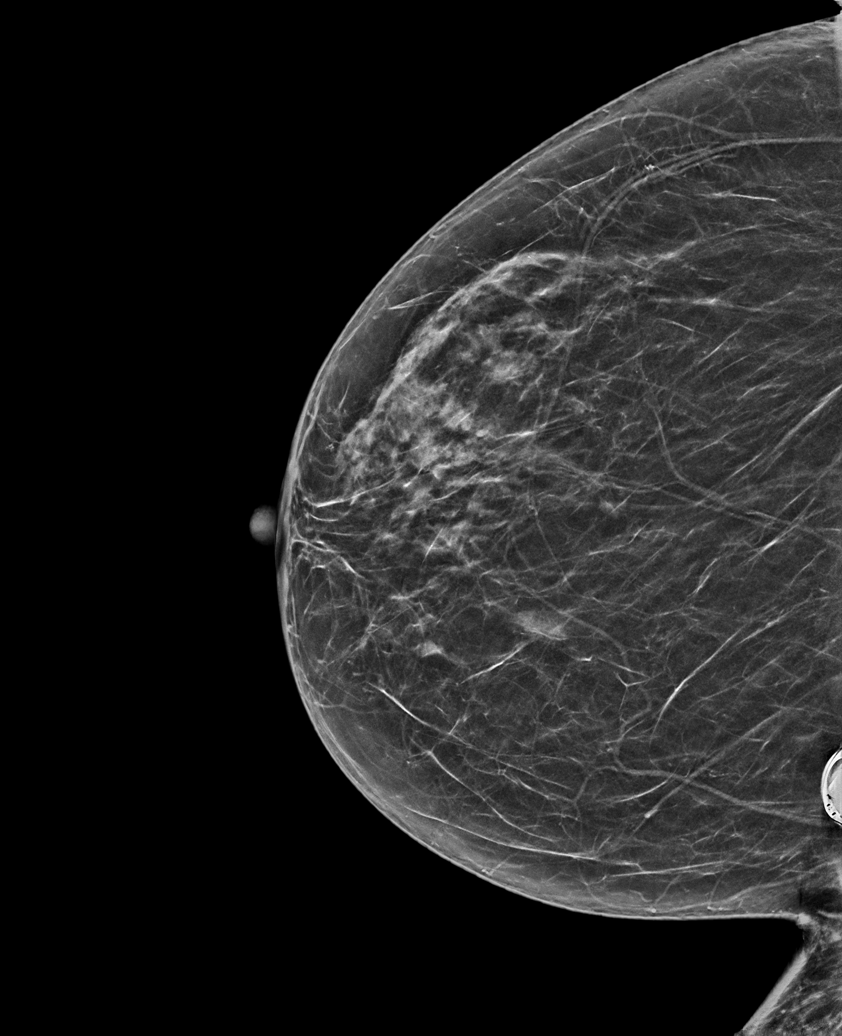

[L MLO synth-2D (2 of 2)]
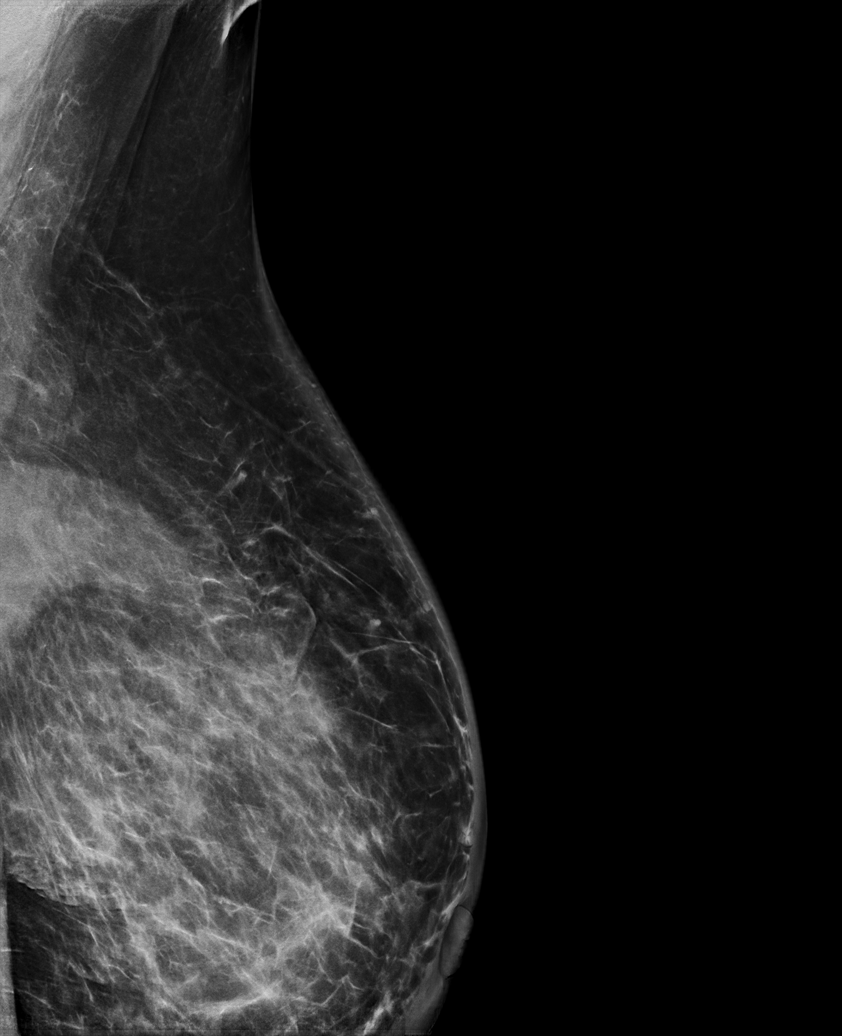

[6 of 31 positions shown; findings below may reference images not displayed]

ACR Breast Density Category b: There are scattered areas of
fibroglandular density.
FINDINGS: There is density and architectural distortion within the LEFT
breast, consistent with postsurgical changes. These are new in
comparison to prior. There is a homogeneous 4 cm masslike opacity in
the LEFT upper outer breast at posterior depth likely reflecting a
seroma. RIGHT chest port. No suspicious mass, distortion, or
microcalcifications are identified to suggest presence of malignancy
in the RIGHT breast.

Targeted ultrasound performed of the LEFT upper outer breast. At 2
o'clock 7 cm from the nipple, there is a heterogeneous fluid
collection which measures 2.7 by 0.7 by 1.6 cm. This is consistent
with a seroma.
IMPRESSION: No mammographic evidence of malignancy.

RECOMMENDATION:
Bilateral diagnostic mammogram in 1 year is recommended.

Recommend supplemental breast screening with breast MRI given young
age at diagnosis.

I have discussed the findings and recommendations with the patient.
If applicable, a reminder letter will be sent to the patient
regarding the next appointment.

BI-RADS CATEGORY  2: Benign.

## 2022-06-04 ENCOUNTER — Other Ambulatory Visit: Payer: Self-pay | Admitting: Nurse Practitioner

## 2022-06-13 ENCOUNTER — Inpatient Hospital Stay: Attending: Oncology

## 2022-06-13 ENCOUNTER — Encounter: Payer: Self-pay | Admitting: Oncology

## 2022-06-13 ENCOUNTER — Inpatient Hospital Stay (HOSPITAL_BASED_OUTPATIENT_CLINIC_OR_DEPARTMENT_OTHER): Admitting: Oncology

## 2022-06-13 VITALS — BP 126/84 | HR 84 | Temp 96.7°F | Ht 67.0 in | Wt 221.0 lb

## 2022-06-13 DIAGNOSIS — D696 Thrombocytopenia, unspecified: Secondary | ICD-10-CM

## 2022-06-13 DIAGNOSIS — G620 Drug-induced polyneuropathy: Secondary | ICD-10-CM | POA: Insufficient documentation

## 2022-06-13 DIAGNOSIS — M8580 Other specified disorders of bone density and structure, unspecified site: Secondary | ICD-10-CM | POA: Insufficient documentation

## 2022-06-13 DIAGNOSIS — Z923 Personal history of irradiation: Secondary | ICD-10-CM | POA: Insufficient documentation

## 2022-06-13 DIAGNOSIS — Z79811 Long term (current) use of aromatase inhibitors: Secondary | ICD-10-CM | POA: Diagnosis not present

## 2022-06-13 DIAGNOSIS — C50812 Malignant neoplasm of overlapping sites of left female breast: Secondary | ICD-10-CM

## 2022-06-13 DIAGNOSIS — T451X5D Adverse effect of antineoplastic and immunosuppressive drugs, subsequent encounter: Secondary | ICD-10-CM | POA: Insufficient documentation

## 2022-06-13 DIAGNOSIS — Z171 Estrogen receptor negative status [ER-]: Secondary | ICD-10-CM | POA: Diagnosis present

## 2022-06-13 DIAGNOSIS — Z95828 Presence of other vascular implants and grafts: Secondary | ICD-10-CM | POA: Diagnosis not present

## 2022-06-13 DIAGNOSIS — M8589 Other specified disorders of bone density and structure, multiple sites: Secondary | ICD-10-CM

## 2022-06-13 DIAGNOSIS — C50412 Malignant neoplasm of upper-outer quadrant of left female breast: Secondary | ICD-10-CM | POA: Diagnosis not present

## 2022-06-13 DIAGNOSIS — R5383 Other fatigue: Secondary | ICD-10-CM | POA: Diagnosis not present

## 2022-06-13 LAB — COMPREHENSIVE METABOLIC PANEL
ALT: 26 U/L (ref 0–44)
AST: 30 U/L (ref 15–41)
Albumin: 4.7 g/dL (ref 3.5–5.0)
Alkaline Phosphatase: 72 U/L (ref 38–126)
Anion gap: 6 (ref 5–15)
BUN: 18 mg/dL (ref 6–20)
CO2: 25 mmol/L (ref 22–32)
Calcium: 9.9 mg/dL (ref 8.9–10.3)
Chloride: 105 mmol/L (ref 98–111)
Creatinine, Ser: 0.84 mg/dL (ref 0.44–1.00)
GFR, Estimated: >60 mL/min (ref >60)
Glucose, Bld: 92 mg/dL (ref 70–99)
Potassium: 3.9 mmol/L (ref 3.5–5.1)
Sodium: 136 mmol/L (ref 135–145)
Total Bilirubin: 0.7 mg/dL (ref 0.3–1.2)
Total Protein: 8.1 g/dL (ref 6.5–8.1)

## 2022-06-13 LAB — CBC WITH DIFFERENTIAL/PLATELET
Abs Immature Granulocytes: 0 10*3/uL (ref 0.00–0.07)
Basophils Absolute: 0 10*3/uL (ref 0.0–0.1)
Basophils Relative: 0 %
Eosinophils Absolute: 0 10*3/uL (ref 0.0–0.5)
Eosinophils Relative: 0 %
HCT: 38.9 % (ref 36.0–46.0)
Hemoglobin: 12.5 g/dL (ref 12.0–15.0)
Immature Granulocytes: 0 %
Lymphocytes Relative: 38 %
Lymphs Abs: 1.1 10*3/uL (ref 0.7–4.0)
MCH: 28.3 pg (ref 26.0–34.0)
MCHC: 32.1 g/dL (ref 30.0–36.0)
MCV: 88.2 fL (ref 80.0–100.0)
Monocytes Absolute: 0.3 10*3/uL (ref 0.1–1.0)
Monocytes Relative: 10 %
Neutro Abs: 1.5 10*3/uL — ABNORMAL LOW (ref 1.7–7.7)
Neutrophils Relative %: 52 %
Platelets: 180 10*3/uL (ref 150–400)
RBC: 4.41 MIL/uL (ref 3.87–5.11)
RDW: 14 % (ref 11.5–15.5)
WBC: 3 10*3/uL — ABNORMAL LOW (ref 4.0–10.5)
nRBC: 0 % (ref 0.0–0.2)

## 2022-06-13 MED ORDER — HEPARIN SOD (PORK) LOCK FLUSH 100 UNIT/ML IV SOLN
500.0000 [IU] | Freq: Once | INTRAVENOUS | Status: AC
  Administered 2022-06-13: 500 [IU] via INTRAVENOUS
  Filled 2022-06-13: qty 5

## 2022-06-13 MED ORDER — SODIUM CHLORIDE 0.9% FLUSH
10.0000 mL | Freq: Once | INTRAVENOUS | Status: AC
  Administered 2022-06-13: 10 mL via INTRAVENOUS
  Filled 2022-06-13: qty 10

## 2022-06-13 MED ORDER — LETROZOLE 2.5 MG PO TABS: 2.5000 mg | ORAL_TABLET | Freq: Every day | ORAL | 6 refills | Status: DC

## 2022-06-14 DIAGNOSIS — M8580 Other specified disorders of bone density and structure, unspecified site: Secondary | ICD-10-CM | POA: Insufficient documentation

## 2022-06-14 DIAGNOSIS — Z95828 Presence of other vascular implants and grafts: Secondary | ICD-10-CM | POA: Insufficient documentation

## 2022-06-14 DIAGNOSIS — Z79811 Long term (current) use of aromatase inhibitors: Secondary | ICD-10-CM | POA: Insufficient documentation

## 2022-06-14 NOTE — Assessment & Plan Note (Signed)
Continue calcium and vitamin D supplementation.  

## 2022-06-14 NOTE — Assessment & Plan Note (Signed)
Recommend patient to continue port flush every 8 weeks 

## 2022-06-14 NOTE — Progress Notes (Signed)
Hematology/Oncology progress note [phone] [phone]   Patient Care Team: [name], NP as PCP - General (Nurse Practitioner)  ASSESSMENT & PLAN:   Aromatase inhibitor use Continue calcium and vitamin D supplementation.  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  -s/p neoadjuvant pembrolizumab/carboplatin Q 3 weeks / weekly Taxol- followed by ddAC x4.   -s/p left upper outer lumpectomy with sentinel lymph node biopsy  ypTis ypN0.  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  Refills sent.  Port-A-Cath in place Recommend patient to continue port flush every 8 weeks  Osteopenia Continue calcium supplementation.  Repeat DEXA in 2 years.  Orders Placed This Encounter  Procedures   CBC with Differential/Platelet    Standing Status:   Future    Standing Expiration Date:   06/14/2023   Comprehensive metabolic panel    Standing Status:   Future    Standing Expiration Date:   06/14/2023   Cancer antigen 27.29    Standing Status:   Future    Standing Expiration Date:   06/14/2023   Cancer antigen 15-3    Standing Status:   Future    Standing Expiration Date:   06/14/2023   Follow-up in 3 months, lab prior to MD.   All questions were answered. The patient knows to call the clinic with any problems, questions or concerns.  [name], MD, [name] [facility] Hematology Oncology 06/13/2022   CHIEF COMPLAINTS/REASON FOR VISIT:  Follow-up for acute triple negative breast cancer  HISTORY OF PRESENTING ILLNESS:   Peggy Bates is a  56 y.o.  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 Oncology history  summary listed as below Oncology History Overview Note  # Pathology dated April 07, 2021 from Peggy Bates:  Ultrasound-guided core biopsy. Nuclear grade 3, high-grade. P53: 70% staining. Ki-67: 70% staining.  ER negative, PR negative, HER2 negative. Mitotic rate of 13 mitoses per high-power field.  The patient brought a copy of her imaging studies with her from [place] and these have been independently reviewed.  Screening mammogram dated March 30, 2021 showed an asymmetric nodular density in the superior lateral aspect of the left breast. The right breast was unremarkable BI-RADS-0.  Ultrasound examination of the left breast showed a 1.1 cm round, nonparallel hypoechoic mass with angular margins. BI-RADS-4.  Family history of breast cancer and a sister in her late 18s, early 76s. No history of genetic testing available at this time.   Breast cancer in female Peggy Bates)  04/21/2021 Initial Diagnosis   Left breast triple negative cancer, residual ER positive DCIS  -03/30/2021, screening mammogram with 3D 2 nodular areas of asymmetric density demonstrated within the superior lateral aspect of the left breast middle third depth.  Finding was best appreciated on 3D tomosynthesis imaging. Targeted ultrasound showed left upper outer breast 1:30 position 1.1 cm round hypoechoic mass with angular margins. 04/21/2021  biopsy of the breast mass Pathology showed infiltrating ductal carcinoma, grade 3, Ki-67 70%, ER negative, PR negative, HER2 negative,    Case was discussed on Tumor board.  Pathology reports and mammogram/ultrasound were done at outside facility and results were reviewed and discussed with patient and her husband.  LN status was  not mentioned on her [place]. Recommend additional images with mammogram and MRI, neoadjuvant chemotherapy    05/12/2021 diagnostic mammogram of left breast showed 2.1 cm biopsy-proven malignancy in the lateral left breast at 2:00, 4 cm from nipple.   Directly adjacent cystic mass, 1.5 cm in size, consistent with biopsy hematoma.  1 abnormal and 1 borderline abnormal left axillary lymph node  05/18/2021 bilateral breast MRI with and without contrast showed 2.5 x 3 x 2.5 cm hematoma containing biopsy clip artifact within the upper outer left breast.  1.6 cm nodular non-mass-like enhancement 2.5 cm posterior/superior to the biopsy clip may represent biopsy-proven malignancy or additional areas of malignancy.  Single abnormal appearing left axillary lymph node with eccentric cortical thickening.  No MRI evidence of the right breast malignancy.   05/20/2021 Slide consultation of her left breast biopsy from outside institution - invasive mammary carcinoma, no special type, grade 3, DCIS and LVI not identified. - ER negative, PR negative, HER2 IHC negative. Ki 67 70-80%     05/20/2021 ultrasound-guided left axillary lymph node was negative for malignancy.  06/02/2021 MRI left breast biopsy of the upper outer quadrant - pathology shows high grade DCIS, ER 30%+    04/29/2021 Cancer Staging   Staging form: Breast, AJCC 8th Edition - Clinical stage from 04/29/2021: Stage IIB (cT2, cN0, cM0, G3, ER-, PR-, HER2-) - Signed by [name], MD on 06/04/2021 Stage prefix: Initial diagnosis Histologic grading system: 3 grade system   05/07/2021 Procedure   Mediport was placed by Dr. Lemar Livings   05/25/2021 Genetic Testing   Negative genetic testing. No pathogenic variants identified on the [facility] Multi-Cancer Panel +RNA. The report date is 05/25/2021.  The Multi-Cancer Panel + RNA offered by [facility] includes sequencing and/or deletion duplication testing of the following 84 genes: AIP, ALK, APC, ATM, AXIN2,BAP1,  BARD1, BLM, BMPR1A, BRCA1, BRCA2, BRIP1, CASR, CDC73, CDH1, CDK4, CDKN1B, CDKN1C, CDKN2A (p14ARF), CDKN2A (p16INK4a), CEBPA, CHEK2, CTNNA1, DICER1, DIS3L2, EGFR (c.2369C>T, p.Thr790Met variant only), EPCAM (Deletion/duplication testing only), FH, FLCN, GATA2, GPC3,  GREM1 (Promoter region deletion/duplication testing only), HOXB13 (c.251G>A, p.Gly84Glu), HRAS, KIT, MAX, MEN1, MET, MITF (c.952G>A, p.Glu318Lys variant only), MLH1, MSH2, MSH3, MSH6, MUTYH, NBN, NF1, NF2, NTHL1, PALB2, PDGFRA, PHOX2B, PMS2, POLD1, POLE, POT1, PRKAR1A, PTCH1, PTEN, RAD50, RAD51C, RAD51D, RB1, RECQL4, RET, RUNX1, SDHAF2, SDHA (sequence changes only), SDHB, SDHC, SDHD, SMAD4, SMARCA4, SMARCB1, SMARCE1, STK11, SUFU, TERC, TERT, TMEM127, TP53, TSC1, TSC2, VHL, WRN and WT1.   06/08/2021 - 10/28/2021 Chemotherapy   pembrolizumab/carboplatin Q 3 weeks / weekly Taxol followed by ddAC x4 with G-CSF support       06/08/2021 - 10/28/2021 Chemotherapy   Patient is on Treatment Plan : BREAST Pembrolizumab + Carboplatin weekly + Paclitaxel D1,8,15 q21d X 4 cycles /  AC q21d x 4 cycles     08/30/2021 Imaging   MRI breast showed no abnormal enhancement remains in the left breast. Complete response to chemotherapy is identified. No MRI evidence of malignancy in either breast.   11/15/2021 Imaging   bilateral MRI breast showed no abnormal enhancement is identified in bilateral breast.   12/10/2021 Surgery   patient underwent left upper outer lumpectomy with sentinel lymph node biopsy.  Residual DCIS, high-grade, 4 sentinel lymph nodes were harvested and all negative for malignancy.  Margins are negative for DCIS, closest margin is greater than 5 mm. ypTis ypN0. Residual DCIS ER 11-50%   01/04/2022 - 02/10/2022 Radiation Therapy   adjuvant breast radiation   03/11/2022 -  Anti-estrogen oral therapy  Started on letrozole 2.5 mg daily      INTERVAL HISTORY Peggy Bates is a 56 y.o. female who has above history reviewed by me today presents for follow up visit for management of left triple negative breast cancer  Patient has been on letrozole 2.5 mg daily.  Patient reports feeling well and tolerating treatments.  Manageable side effects.  No new complaints.  . Review of Systems   Constitutional:  Positive for fatigue. Negative for appetite change, chills and fever.  HENT:   Negative for hearing loss and voice change.   Eyes:  Negative for eye problems.  Respiratory:  Negative for chest tightness and cough.   Cardiovascular:  Negative for chest pain.  Gastrointestinal:  Negative for abdominal distention, abdominal pain and blood in stool.  Endocrine: Negative for hot flashes.  Genitourinary:  Negative for difficulty urinating and frequency.   Musculoskeletal:  Negative for arthralgias.  Skin:  Negative for itching and rash.  Neurological:  Positive for numbness. Negative for extremity weakness.  Hematological:  Negative for adenopathy.  Psychiatric/Behavioral:  Negative for confusion.     MEDICAL HISTORY:  Past Medical History:  Diagnosis Date   Arthritis    knees   Breast cancer (HCC)    2022   COVID-19 12/2020   Depression    Family history of breast cancer    Goiter    Malignant neoplasm of left female breast (HCC) 04/07/2021   Personal history of chemotherapy    Personal history of radiation therapy     SURGICAL HISTORY: Past Surgical History:  Procedure Laterality Date   ADJACENT TISSUE TRANSFER/TISSUE REARRANGEMENT Left 12/10/2021   Procedure: ADJACENT TISSUE TRANSFER/TISSUE REARRANGEMENT;  Surgeon: Earline Mayotte, MD;  Location: [facility] ORS;  Service: General;  Laterality: Left;   AXILLARY SENTINEL NODE BIOPSY Left 12/10/2021   Procedure: AXILLARY SENTINEL NODE BIOPSY;  Surgeon: Earline Mayotte, MD;  Location: [facility] ORS;  Service: General;  Laterality: Left;   BREAST BIOPSY Left 04/07/2021   [facility] in [place]:u/s bx triple neg   BREAST BIOPSY Left 05/20/2021   [place] Axilla Bx, hydro 3 marker, neg   BREAST LUMPECTOMY WITH NEEDLE LOCALIZATION Left 12/10/2021   Procedure: BREAST LUMPECTOMY WITH NEEDLE LOCALIZATION, PECTORAL BLOCK;  Surgeon: Earline Mayotte, MD;  Location: [facility] ORS;  Service: General;  Laterality: Left;    KNEE ARTHROSCOPY Right 2008   PORTACATH PLACEMENT Right 05/07/2021   Procedure: INSERTION PORT-A-CATH;  Surgeon: Earline Mayotte, MD;  Location: [facility] ORS;  Service: General;  Laterality: Right;   TOOTH EXTRACTION     TRANSCERVICAL UTERINE FIBROID(S) ABLATION  2010    SOCIAL HISTORY: Social History   Socioeconomic History   Marital status: Married    Spouse name: Not on file   Number of children: Not on file   Years of education: Not on file   Highest education level: Not on file  Occupational History   Not on file  Tobacco Use   Smoking status: Never   Smokeless tobacco: Never  Vaping Use   Vaping Use: Never used  Substance and Sexual Activity   Alcohol use: Never   Drug use: Never   Sexual activity: Yes  Other Topics Concern   Not on file  Social History Narrative   ** Merged History Encounter **       Social Determinants of Health   Financial Resource Strain: Not on file  Food Insecurity: Not on file  Transportation Needs: Not on file  Physical Activity: Not  on file  Stress: Not on file  Social Connections: Not on file  Intimate Partner Violence: Not on file    FAMILY HISTORY: Family History  Problem Relation Age of Onset   Breast cancer Paternal Grandmother    Breast cancer Sister        dx 30s, recurrence x3   Diabetes Sister    Diabetes Father    Throat cancer Maternal Grandmother     ALLERGIES:  has No Known Allergies.  MEDICATIONS:  Current Outpatient Medications  Medication Sig Dispense Refill   acetaminophen (TYLENOL) 650 MG CR tablet Take 1,300 mg by mouth See admin instructions. Take 1300 mg in the morning may take a second 1300 mg dose during the day as needed for pain     cholecalciferol (VITAMIN D) 25 MCG (1000 UNIT) tablet Take 1,000 Units by mouth daily.     diclofenac Sodium (VOLTAREN) 1 % GEL Apply 1 application topically daily.     gabapentin (NEURONTIN) 600 MG tablet Take 600 mg by mouth every morning.     meloxicam (MOBIC) 15 MG  tablet Take 15 mg by mouth daily.     phentermine (ADIPEX-P) 37.5 MG tablet Take 18.75 mg by mouth daily.     silver sulfADIAZINE (SILVADENE) 1 % cream Apply 1 application topically 2 (two) times daily. 50 g 3   letrozole (FEMARA) 2.5 MG tablet Take 1 tablet (2.5 mg total) by mouth daily. 30 tablet 6   No current facility-administered medications for this visit.   Facility-Administered Medications Ordered in Other Visits  Medication Dose Route Frequency Provider Last Rate Last Admin   heparin lock flush 100 unit/mL  500 Units Intravenous Once [name], MD       heparin lock flush 100 unit/mL  500 Units Intravenous Once [name], MD       sodium chloride flush (NS) 0.9 % injection 10 mL  10 mL Intravenous Once [name], MD       sodium chloride flush (NS) 0.9 % injection 10 mL  10 mL Intravenous PRN [name], MD         PHYSICAL EXAMINATION: ECOG PERFORMANCE STATUS: 0 - Asymptomatic Vitals:   06/13/22 1308  BP: 126/84  Pulse: 84  Temp: (!) 96.7 F (35.9 C)   Filed Weights   06/13/22 1308  Weight: 221 lb (100.2 kg)    Physical Exam Constitutional:      General: She is not in acute distress. HENT:     Head: Normocephalic and atraumatic.  Eyes:     General: No scleral icterus. Cardiovascular:     Rate and Rhythm: Normal rate and regular rhythm.     Heart sounds: Normal heart sounds.  Pulmonary:     Effort: Pulmonary effort is normal. No respiratory distress.     Breath sounds: No wheezing.  Abdominal:     General: Bowel sounds are normal. There is no distension.     Palpations: Abdomen is soft.  Musculoskeletal:        General: No deformity. Normal range of motion.     Cervical back: Normal range of motion and neck supple.  Skin:    General: Skin is warm and dry.     Findings: No erythema or rash.  Neurological:     Mental Status: She is alert and oriented to person, place, and time. Mental status is at baseline.     Cranial Nerves: No cranial nerve deficit.      Coordination: Coordination normal.  Psychiatric:  Mood and Affect: Mood normal.     LABORATORY DATA:  I have reviewed the data as listed Lab Results  Component Value Date   WBC 3.0 (L) 06/13/2022   HGB 12.5 06/13/2022   HCT 38.9 06/13/2022   MCV 88.2 06/13/2022   PLT 180 06/13/2022   Recent Labs    10/28/21 0810 02/02/22 0823 06/13/22 1254  NA 137 135 136  K 4.0 4.1 3.9  CL 105 104 105  CO2 26 25 25   GLUCOSE 102* 102* 92  BUN 15 13 18   CREATININE 0.67 0.66 0.84  CALCIUM 9.0 9.6 9.9  GFRNONAA >60 >60 >60  PROT 7.5 7.8 8.1  ALBUMIN 4.2 4.2 4.7  AST 22 36 30  ALT 23 44 26  ALKPHOS 95 93 72  BILITOT 0.3 0.2* 0.7    Iron/TIBC/Ferritin/ %Sat No results found for: "IRON", "TIBC", "FERRITIN", "IRONPCTSAT"    RADIOGRAPHIC STUDIES: I have personally reviewed the radiological images as listed and agreed with the findings in the report. MM DIAG BREAST TOMO BILATERAL  Result Date: 05/12/2022 CLINICAL DATA:  LEFT lumpectomy December 2022. Reportedly recent aspiration of a LEFT seroma. Patient is status post neoadjuvant chemotherapy and radiation. EXAM: DIGITAL DIAGNOSTIC BILATERAL MAMMOGRAM WITH TOMOSYNTHESIS AND CAD; ULTRASOUND LEFT BREAST LIMITED TECHNIQUE: Bilateral digital diagnostic mammography and breast tomosynthesis was performed. The images were evaluated with computer-aided detection.; Targeted ultrasound examination of the left breast was performed. COMPARISON:  Previous exam(s). ACR Breast Density Category b: There are scattered areas of fibroglandular density. FINDINGS: There is density and architectural distortion within the LEFT breast, consistent with postsurgical changes. These are new in comparison to prior. There is a homogeneous 4 cm masslike opacity in the LEFT upper outer breast at posterior depth likely reflecting a seroma. RIGHT chest port. No suspicious mass, distortion, or microcalcifications are identified to suggest presence of malignancy in the RIGHT  breast. Targeted ultrasound performed of the LEFT upper outer breast. At 2 o'clock 7 cm from the nipple, there is a heterogeneous fluid collection which measures 2.7 by 0.7 by 1.6 cm. This is consistent with a seroma. IMPRESSION: No mammographic evidence of malignancy. RECOMMENDATION: Bilateral diagnostic mammogram in 1 year is recommended. Recommend supplemental breast screening with breast MRI given young age at diagnosis. I have discussed the findings and recommendations with the patient. If applicable, a reminder letter will be sent to the patient regarding the next appointment. BI-RADS CATEGORY  2: Benign. Electronically Signed   By: Meda Klinefelter M.D.   On: 05/12/2022 10:31  US BREAST LTD UNI LEFT INC AXILLA  Result Date: 05/12/2022 CLINICAL DATA:  LEFT lumpectomy December 2022. Reportedly recent aspiration of a LEFT seroma. Patient is status post neoadjuvant chemotherapy and radiation. EXAM: DIGITAL DIAGNOSTIC BILATERAL MAMMOGRAM WITH TOMOSYNTHESIS AND CAD; ULTRASOUND LEFT BREAST LIMITED TECHNIQUE: Bilateral digital diagnostic mammography and breast tomosynthesis was performed. The images were evaluated with computer-aided detection.; Targeted ultrasound examination of the left breast was performed. COMPARISON:  Previous exam(s). ACR Breast Density Category b: There are scattered areas of fibroglandular density. FINDINGS: There is density and architectural distortion within the LEFT breast, consistent with postsurgical changes. These are new in comparison to prior. There is a homogeneous 4 cm masslike opacity in the LEFT upper outer breast at posterior depth likely reflecting a seroma. RIGHT chest port. No suspicious mass, distortion, or microcalcifications are identified to suggest presence of malignancy in the RIGHT breast. Targeted ultrasound performed of the LEFT upper outer breast. At 2 o'clock 7 cm from the  nipple, there is a heterogeneous fluid collection which measures 2.7 by 0.7 by 1.6 cm.  This is consistent with a seroma. IMPRESSION: No mammographic evidence of malignancy. RECOMMENDATION: Bilateral diagnostic mammogram in 1 year is recommended. Recommend supplemental breast screening with breast MRI given young age at diagnosis. I have discussed the findings and recommendations with the patient. If applicable, a reminder letter will be sent to the patient regarding the next appointment. BI-RADS CATEGORY  2: Benign. Electronically Signed   By: Meda Klinefelter M.D.   On: 05/12/2022 10:31  DG Bone Density  Result Date: 05/12/2022 EXAM: DUAL X-RAY ABSORPTIOMETRY (DXA) FOR BONE MINERAL DENSITY IMPRESSION: Dear Dr Cathie Hoops, Your patient Kadian Murrey completed a FRAX assessment on 05/12/2022 using the Lunar iDXA DXA System (analysis version: 14.10) manufactured by [employer]. The following summarizes the results of our evaluation. PATIENT BIOGRAPHICAL: Name: Ravneet, Butchko Patient ID: [id number] Birth Date: 1965-12-31 Height:    65.0 in. Gender:     Female    [age]       Weight:    239.5 lbs. Ethnicity:  Black                            Exam Date: 05/12/2022 FRAX* RESULTS:  (version: 3.5) 10-year Probability of Fracture1 Major Osteoporotic Fracture2 Hip Fracture 2.2% 0.1% Population: [place] (Black) Risk Factors: None Based on Femur (Right) Neck BMD 1 -The 10-year probability of fracture may be lower than reported if the patient has received treatment. 2 -Major Osteoporotic Fracture: Clinical Spine, Forearm, Hip or Shoulder *FRAX is a Armed forces logistics/support/administrative officer of the [employer] of [employer] for Metabolic Bone Disease, a World Science writer (WHO) [employer]. ASSESSMENT: The probability of a major osteoporotic fracture is 2.2% within the next ten years. The probability of a hip fracture is 0.1% within the next ten years. . Your patient Kristiann Cotterman completed a BMD test on 05/12/2022 using the [employer] DXA System (software version: 14.10) manufactured by [employer]. The following summarizes the results of our evaluation. Technologist: MTB PATIENT BIOGRAPHICAL: Name: Orlena, Othman Patient ID: [id number] Birth Date: [birth year] Height: 65.0 in. Gender: Female Exam Date: 05/12/2022 Weight: 239.5 lbs. Indications: Breast CA, Caucasian, Postmenopausal Fractures: Treatments: Calcium, Letrozole, Vitamin D DENSITOMETRY RESULTS: Site         Region     Measured Date Measured Age WHO Classification Young Adult T-score BMD         %Change vs. Previous Significant Change (*) AP Spine L1-L4 05/12/2022 53.9 Osteopenia -2.1 0.939 g/cm2 - - DualFemur Neck Right 05/12/2022 53.9 Osteopenia -1.2 0.871 g/cm2 - - DualFemur Total Mean 05/12/2022 53.9 Normal -0.7 0.921 g/cm2 - - Left Forearm Radius 33% 05/12/2022 53.9 Normal 0.6 0.926 g/cm2 - - ASSESSMENT: The BMD measured at AP Spine L1-L4 is 0.939 g/cm2 with a T-score of -2.1. This patient is considered osteopenic according to World Health Organization [facility]) criteria. The scan quality is good. World Science writer [facility]) criteria for post-menopausal, Caucasian Women: Normal:                   T-score at or above -1 SD Osteopenia/low bone mass: T-score between -1 and -2.5 SD Osteoporosis:             T-score at or below -2.5 SD RECOMMENDATIONS: 1. All patients should optimize calcium and vitamin D intake. 2. Consider FDA-approved medical therapies in postmenopausal women  and men aged 71 years and older, based on the following: a. A hip or vertebral(clinical or morphometric) fracture b. T-score < -2.5 at the femoral neck or spine after appropriate evaluation to exclude secondary causes c. Low bone mass (T-score between -1.0 and -2.5 at the femoral neck or spine) and a 10-year probability of a hip fracture > 3% or a 10-year probability of a major osteoporosis-related fracture > 20% based on the [place]-adapted WHO algorithm 3. Clinician judgment and/or patient preferences may indicate treatment for people with 10-year fracture  probabilities above or below these levels FOLLOW-UP: People with diagnosed cases of osteoporosis or at high risk for fracture should have regular bone mineral density tests. For patients eligible for Medicare, routine testing is allowed once every 2 years. The testing frequency can be increased to one year for patients who have rapidly progressing disease, those who are receiving or discontinuing medical therapy to restore bone mass, or have additional risk factors. I have reviewed this report, and agree with the above findings. Mark A. Tyron Russell, M.D. [facility] Electronically Signed   By: Ulyses Southward M.D.   On: 05/12/2022 09:27

## 2022-06-16 DIAGNOSIS — Z171 Estrogen receptor negative status [ER-]: Secondary | ICD-10-CM

## 2022-06-16 NOTE — Progress Notes (Signed)
Survivorship Care Plan visit completed.  Treatment summary reviewed and given to patient.  ASCO answers booklet reviewed and given to patient.  CARE program and Cancer Transitions discussed with patient along with other resources cancer center offers to patients and caregivers.  Patient verbalized understanding.    

## 2022-06-22 ENCOUNTER — Inpatient Hospital Stay: Attending: Oncology | Admitting: Occupational Therapy

## 2022-06-22 DIAGNOSIS — L905 Scar conditions and fibrosis of skin: Secondary | ICD-10-CM

## 2022-06-22 NOTE — Therapy (Signed)
[facility] at [facility] [place], [place], [place], [address] Phone: [phone]   Fax:  [phone]  Occupational Therapy Screen:  Patient Details  Name: Peggy Bates MRN: [id number] Date of Birth: Jan 04, 1966 No data recorded  Encounter Date: 06/22/2022   OT End of Session - 06/22/22 1653     Visit Number 0             Past Medical History:  Diagnosis Date   Arthritis    knees   Breast cancer ([place])    2022   COVID-19 12/2020   Depression    Family history of breast cancer    Goiter    Malignant neoplasm of left female breast ([place]) 04/07/2021   Personal history of chemotherapy    Personal history of radiation therapy     Past Surgical History:  Procedure Laterality Date   ADJACENT TISSUE TRANSFER/TISSUE REARRANGEMENT Left 12/10/2021   Procedure: ADJACENT TISSUE TRANSFER/TISSUE REARRANGEMENT;  Surgeon: Robert Bellow, MD;  Location: [facility] ORS;  Service: General;  Laterality: Left;   AXILLARY SENTINEL NODE BIOPSY Left 12/10/2021   Procedure: AXILLARY SENTINEL NODE BIOPSY;  Surgeon: Robert Bellow, MD;  Location: [facility] ORS;  Service: General;  Laterality: Left;   BREAST BIOPSY Left 04/07/2021   [place] in [place]:u/s bx triple neg   BREAST BIOPSY Left 05/20/2021   [place] Ax[place] Bx, hydro 3 marker, neg   BREAST LUMPECTOMY WITH NEEDLE LOCALIZATION Left 12/10/2021   Procedure: BREAST LUMPECTOMY WITH NEEDLE LOCALIZATION, PECTORAL BLOCK;  Surgeon: Robert Bellow, MD;  Location: [facility] ORS;  Service: General;  Laterality: Left;   KNEE ARTHROSCOPY Right 2008   PORTACATH PLACEMENT Right 05/07/2021   Procedure: INSERTION PORT-A-CATH;  Surgeon: Robert Bellow, MD;  Location: [facility] ORS;  Service: General;  Laterality: Right;   TOOTH EXTRACTION     TRANSCERVICAL UTERINE FIBROID(S) ABLATION  2010    There were no vitals filed for this visit.   Subjective Assessment - 06/22/22 1650     Subjective   I spoke to [place] the cancer transition nurse and she told me about you.  My left breast is larger, hard at some places and swollen-causing it to be bigger than my right.  I wear a bra so it can pull my breast in that my arm does not swing against it.  As well as I cannot lay on that side or on my breast it is tender and painful.  Shooting pains a 10/10 at times.    Currently in Pain? Yes    Pain Score 8     Pain Location Breast    Pain Orientation Left    Pain Descriptors / Indicators Tender    Pain Type Surgical pain    Pain Onset More than a month ago    Pain Frequency Intermittent                 LYMPHEDEMA/ONCOLOGY QUESTIONNAIRE - 06/22/22 0001       Right Upper Extremity Lymphedema   15 cm Proximal to Olecranon Process 39.5 cm    10 cm Proximal to Olecranon Process 38.5 cm    Olecranon Process 30.5 cm    15 cm Proximal to Ulnar Styloid Process 28 cm    10 cm Proximal to Ulnar Styloid Process 22.4 cm    Just Proximal to Ulnar Styloid Process 17 cm      Left Upper Extremity Lymphedema   15 cm Proximal to Olecranon Process  39.5 cm    10 cm Proximal to Olecranon Process 38 cm    Olecranon Process 30 cm    15 cm Proximal to Ulnar Styloid Process 28 cm    10 cm Proximal to Ulnar Styloid Process 23 cm    Just Proximal to Ulnar Styloid Process 17 cm               DR YU 06/13/22: Left breast invasive mammary carcinoma,lateral left,  triple negative,  # Left upper outer quadrant mass-triple negative #left breast additional non mass enhancement 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  -s/p neoadjuvant pembrolizumab/carboplatin Q 3 weeks / weekly Taxol- followed by ddAC x4.   -s/p left upper outer lumpectomy with sentinel lymph node biopsy  ypTis ypN0.  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  Refills sent    OT SCREEN 06/22/22: Patient referred by [facility] from  cancer transition.  For left breast scar tissue and fibrosis with pain and tenderness.  As well as increased swelling.  Per patient left breast larger-follow-up with surgeon in September. Patient had a left lumpectomy December 2022.  She finished radiation February 17, 2022.  Per patient around 4-5 lymph nodes was removed. 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  Pain and tenderness unable to sleep on the left side or pressure on the breast.  Shooting pains randomly can be sometimes 10/10. Bilateral circumference of her upper extremity was taken with bicep area on nondominant arm increased 0.5 cm.  We will continue to monitor patient and be preventative about decongesting left quadrant/thoracic to prevent backing up of fluid and upper arm. Soft tissue mobilization was done by OT including mini massager with great success on decrease fibrosis, scar tissue and tenderness in L breast from  3:00 to 6:00 and 9:00 to 11:00. Recommend at this time for patient to do soft tissue massage 2 times a day with some modified MLD.  As well as unilateral postmastectomy Jovi pack breast pad to use most of the time under sports bra and follow-up with me in 4 to 5 weeks.                                 Visit Diagnosis: Scar condition and fibrosis of skin    Problem List Patient Active Problem List   Diagnosis Date Noted   Aromatase inhibitor use 06/14/2022   Port-A-Cath in place 06/14/2022   Osteopenia 06/14/2022   Vaginal bleeding 12/23/2021   Thrombocytopenia ([place]) 09/15/2021   Chemotherapy-induced neuropathy ([place]) 08/18/2021   Obesity (BMI 30.0-34.9) 08/04/2021   Transaminitis 07/07/2021   Chemotherapy-induced neutropenia ([place]) 07/07/2021   Encounter for antineoplastic chemotherapy 06/08/2021   Genetic testing 05/26/2021   Family history of breast cancer    Goals of care, counseling/discussion 04/29/2021   Anxiety 04/27/2021   BMI  32.0-32.9,adult 04/27/2021   Goiter 04/27/2021   Major depression, single episode 04/27/2021   Vitamin D deficiency 04/27/2021   Breast cancer in female [facility]) 04/21/2021   Arthritis of both knees 11/06/2017    Rosalyn Gess, OTR/L,CLT 06/22/2022, 4:53 PM  [place] at [facility] [address], [place], [place], [address] Phone: [phone]   Fax:  [phone]  Name: Peggy Bates MRN: [id number] Date of Birth: 1966-01-11

## 2022-07-27 ENCOUNTER — Inpatient Hospital Stay: Attending: Oncology | Admitting: Occupational Therapy

## 2022-07-27 DIAGNOSIS — L905 Scar conditions and fibrosis of skin: Secondary | ICD-10-CM

## 2022-07-27 NOTE — Therapy (Signed)
[facility] at [facility] [place], [place], [place], [address] Phone: [phone]   Fax:  [phone]  Occupational Therapy Screen:  Patient Details  Name: Peggy Bates MRN: [id number] Date of Birth: [birth year] No data recorded  Encounter Date: 07/27/2022   OT End of Session - 07/27/22 1255     Visit Number 0             Past Medical History:  Diagnosis Date   Arthritis    knees   Breast cancer ([place])    2022   COVID-19 12/2020   Depression    Family history of breast cancer    Goiter    Malignant neoplasm of left female breast ([place]) 04/07/2021   Personal history of chemotherapy    Personal history of radiation therapy     Past Surgical History:  Procedure Laterality Date   ADJACENT TISSUE TRANSFER/TISSUE REARRANGEMENT Left 12/10/2021   Procedure: ADJACENT TISSUE TRANSFER/TISSUE REARRANGEMENT;  Surgeon: Robert Bellow, MD;  Location: [facility];  Service: General;  Laterality: Left;   AXILLARY SENTINEL NODE BIOPSY Left 12/10/2021   Procedure: AXILLARY SENTINEL NODE BIOPSY;  Surgeon: Robert Bellow, MD;  Location: [facility];  Service: General;  Laterality: Left;   BREAST BIOPSY Left 04/07/2021   [place] in [place]:u/s bx triple neg   BREAST BIOPSY Left 05/20/2021   [place] Axilla Bx, hydro 3 marker, neg   BREAST LUMPECTOMY WITH NEEDLE LOCALIZATION Left 12/10/2021   Procedure: BREAST LUMPECTOMY WITH NEEDLE LOCALIZATION, PECTORAL BLOCK;  Surgeon: Robert Bellow, MD;  Location: [facility];  Service: General;  Laterality: Left;   KNEE ARTHROSCOPY Right 2008   PORTACATH PLACEMENT Right 05/07/2021   Procedure: INSERTION PORT-A-CATH;  Surgeon: Robert Bellow, MD;  Location: [facility];  Service: General;  Laterality: Right;   TOOTH EXTRACTION     TRANSCERVICAL UTERINE FIBROID(S) ABLATION  2010    There were no vitals filed for this visit.   Subjective Assessment - 07/27/22 1254     Subjective   The pain and tenderness much better.  Where he used to have several sharp shooting pains during the day I only have it now once or twice maybe a day not even every day.  The brace is not larger anymore heavier.  But is still have there is one area on the outside of my breast it is hard.  I do wear the jovipak under my bra.    Currently in Pain? No/denies                 LYMPHEDEMA/ONCOLOGY QUESTIONNAIRE - 07/27/22 0001       Right Upper Extremity Lymphedema   15 cm Proximal to Olecranon Process 38.5 cm    10 cm Proximal to Olecranon Process 38 cm    Olecranon Process 30 cm    15 cm Proximal to Ulnar Styloid Process 28.5 cm      Left Upper Extremity Lymphedema   15 cm Proximal to Olecranon Process 38.2 cm    10 cm Proximal to Olecranon Process 38 cm    Olecranon Process 29.4 cm    15 cm Proximal to Ulnar Styloid Process 27.8 cm               DR YU 06/13/22: Left breast invasive mammary carcinoma,lateral left,  triple negative,  # Left upper outer quadrant mass-triple negative #left breast additional non mass enhancement 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  -s/p neoadjuvant  pembrolizumab/carboplatin Q 3 weeks / weekly Taxol- followed by ddAC x4.   -s/p left upper outer lumpectomy with sentinel lymph node biopsy  ypTis ypN0.  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  Refills sent      OT SCREEN 06/22/22: Patient referred by [facility] from cancer transition.  For left breast scar tissue and fibrosis with pain and tenderness.  As well as increased swelling.  Per patient left breast larger-follow-up with surgeon in September. Patient had a left lumpectomy December 2022.  She finished radiation February 17, 2022.  Per patient around 4-5 lymph nodes was removed. 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  Pain and tenderness unable to sleep  on the left side or pressure on the breast.  Shooting pains randomly can be sometimes 10/10. Bilateral circumference of her upper extremity was taken with bicep area on nondominant arm increased 0.5 cm.  We will continue to monitor patient and be preventative about decongesting left quadrant/thoracic to prevent backing up of fluid and upper arm. Soft tissue mobilization was done by OT including mini massager with great success on decrease fibrosis, scar tissue and tenderness in L breast from  3:00 to 6:00 and 9:00 to 11:00. Recommend at this time for patient to do soft tissue massage 2 times a day with some modified MLD.  As well as unilateral postmastectomy Jovi pack breast pad to use most of the time under sports bra and follow-up with me in 4 to 5 weeks.   OT SCREEN 07/27/22: Patient seems about a month ago for left breast scar tissue and fibrosis with pain and tenderness.  As well as increased swelling.  Per patient left breast larger-follow-up with surgeon in September. Patient had a left lumpectomy December 2022.  She finished radiation February 17, 2022.  Per patient around 4-5 lymph nodes was removed. 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  Month ago she had pain and tenderness unable to sleep on the left side or pressure on the breast.  Shooting pains randomly can be sometimes 10/10.  Patient return after doing soft tissue massage and wearing Jovi pack at home for about a month.  Patient reports pain decreased as well as tenderness.  Has here and they are shooting pain is about 8/10 but not daily anymore.  She does not have pain and tenderness during sleeping or hugging her breast. OT remeasured bilateral circumference of upper extremity -still within normal limits compared to the right side.  Patient will continue to wear Jovi pack preventative for decongesting left quadrant/thoracic to prevent backing up of fluid and upper arm. Fibrosis improved greatly with  patient having no issues between 9 and 11:00.  Do have area of fibrotic tissue between 2 and 4:00 on the lateral breast.  Soft tissue mobilization was done by OT including mini massager but also done fibrotic techniques and patient was educated using fibrotic techniques with great success.  Recommend at this stage for patient to use to check back for 2 to 3 weeks under her bra or Jovi pack to break up fibrosis area.  Improving greatly in session.  Patient can also continue with modified MLD using left AI instead of AAA because of Chemo-Port. Patient can follow-up with me in 2 to 3 weeks again.  Visit Diagnosis: Scar condition and fibrosis of skin    Problem List Patient Active Problem List   Diagnosis Date Noted   Aromatase inhibitor use 06/14/2022   Port-A-Cath in place 06/14/2022   Osteopenia 06/14/2022   Vaginal bleeding 12/23/2021   Thrombocytopenia ([place]) 09/15/2021   Chemotherapy-induced neuropathy ([place]) 08/18/2021   Obesity (BMI 30.0-34.9) 08/04/2021   Transaminitis 07/07/2021   Chemotherapy-induced neutropenia ([place]) 07/07/2021   Encounter for antineoplastic chemotherapy 06/08/2021   Genetic testing 05/26/2021   Family history of breast cancer    Goals of care, counseling/discussion 04/29/2021   Anxiety 04/27/2021   BMI 32.0-32.9,adult 04/27/2021   Goiter 04/27/2021   Major depression, single episode 04/27/2021   Vitamin D deficiency 04/27/2021   Breast cancer in female [facility]) 04/21/2021   Arthritis of both knees 11/06/2017    Rosalyn Gess, OTR/L,CLT 07/27/2022, 12:56 PM  [place] at [facility] [address], [place], [place], [address] Phone: [phone]   Fax:  [phone]  Name: Berlyn Saylor MRN: [id number] Date of Birth: 1966-08-01

## 2022-08-10 ENCOUNTER — Ambulatory Visit: Admitting: Occupational Therapy

## 2022-08-24 ENCOUNTER — Inpatient Hospital Stay: Attending: Oncology | Admitting: Occupational Therapy

## 2022-08-24 DIAGNOSIS — L905 Scar conditions and fibrosis of skin: Secondary | ICD-10-CM

## 2022-08-24 NOTE — Therapy (Signed)
[facility] at [facility] [place], [place], [place], [address] Phone: [phone]   Fax:  [phone]  Occupational Therapy Screen:  Patient Details  Name: Peggy Bates MRN: [id number] Date of Birth: Jan 30, 1966 No data recorded  Encounter Date: 08/24/2022   OT End of Session - 08/24/22 1420     Visit Number 0             Past Medical History:  Diagnosis Date   Arthritis    knees   Breast cancer ([place])    2022   COVID-19 12/2020   Depression    Family history of breast cancer    Goiter    Malignant neoplasm of left female breast ([place]) 04/07/2021   Personal history of chemotherapy    Personal history of radiation therapy     Past Surgical History:  Procedure Laterality Date   ADJACENT TISSUE TRANSFER/TISSUE REARRANGEMENT Left 12/10/2021   Procedure: ADJACENT TISSUE TRANSFER/TISSUE REARRANGEMENT;  Surgeon: Robert Bellow, MD;  Location: [facility];  Service: General;  Laterality: Left;   AXILLARY SENTINEL NODE BIOPSY Left 12/10/2021   Procedure: AXILLARY SENTINEL NODE BIOPSY;  Surgeon: Robert Bellow, MD;  Location: [facility];  Service: General;  Laterality: Left;   BREAST BIOPSY Left 04/07/2021   [place] in [place]:u/s bx triple neg   BREAST BIOPSY Left 05/20/2021   [place] Axilla Bx, hydro 3 marker, neg   BREAST LUMPECTOMY WITH NEEDLE LOCALIZATION Left 12/10/2021   Procedure: BREAST LUMPECTOMY WITH NEEDLE LOCALIZATION, PECTORAL BLOCK;  Surgeon: Robert Bellow, MD;  Location: [facility];  Service: General;  Laterality: Left;   KNEE ARTHROSCOPY Right 2008   PORTACATH PLACEMENT Right 05/07/2021   Procedure: INSERTION PORT-A-CATH;  Surgeon: Robert Bellow, MD;  Location: [facility];  Service: General;  Laterality: Right;   TOOTH EXTRACTION     TRANSCERVICAL UTERINE FIBROID(S) ABLATION  2010    There were no vitals filed for this visit.   Subjective Assessment - 08/24/22 1416     Subjective   I did pick up the chip bag but about a week ago sure he had transportation issues.  I love it I wear it wherever that heart Aerius is on the lateral side of my breast and it is working well.  I did want to ask you about that mini massager you using.    Currently in Pain? No/denies                 LYMPHEDEMA/ONCOLOGY QUESTIONNAIRE - 08/24/22 0001       Right Upper Extremity Lymphedema   15 cm Proximal to Olecranon Process 39 cm    10 cm Proximal to Olecranon Process 37 cm    Olecranon Process 29.5 cm    15 cm Proximal to Ulnar Styloid Process 28.2 cm    10 cm Proximal to Ulnar Styloid Process 23 cm    Just Proximal to Ulnar Styloid Process 17.5 cm      Left Upper Extremity Lymphedema   15 cm Proximal to Olecranon Process 38 cm    10 cm Proximal to Olecranon Process 37 cm    Olecranon Process 30 cm    15 cm Proximal to Ulnar Styloid Process 28.5 cm    10 cm Proximal to Ulnar Styloid Process 24 cm    Just Proximal to Ulnar Styloid Process 17.5 cm               DR YU 06/13/22: Left breast invasive  mammary carcinoma,lateral left,  triple negative,  # Left upper outer quadrant mass-triple negative #left breast additional non mass enhancement 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  -s/p neoadjuvant pembrolizumab/carboplatin Q 3 weeks / weekly Taxol- followed by ddAC x4.   -s/p left upper outer lumpectomy with sentinel lymph node biopsy  ypTis ypN0.  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  Refills sent      OT SCREEN 06/22/22: Patient referred by [facility] from cancer transition.  For left breast scar tissue and fibrosis with pain and tenderness.  As well as increased swelling.  Per patient left breast larger-follow-up with surgeon in September. Patient had a left lumpectomy December 2022.  She finished radiation February 17, 2022.  Per patient around 4-5 lymph nodes was  removed. 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  Pain and tenderness unable to sleep on the left side or pressure on the breast.  Shooting pains randomly can be sometimes 10/10. Bilateral circumference of her upper extremity was taken with bicep area on nondominant arm increased 0.5 cm.  We will continue to monitor patient and be preventative about decongesting left quadrant/thoracic to prevent backing up of fluid and upper arm. Soft tissue mobilization was done by OT including mini massager with great success on decrease fibrosis, scar tissue and tenderness in L breast from  3:00 to 6:00 and 9:00 to 11:00. Recommend at this time for patient to do soft tissue massage 2 times a day with some modified MLD.  As well as unilateral postmastectomy Jovi pack breast pad to use most of the time under sports bra and follow-up with me in 4 to 5 weeks.     OT SCREEN 07/27/22: Patient seems about a month ago for left breast scar tissue and fibrosis with pain and tenderness.  As well as increased swelling.  Per patient left breast larger-follow-up with surgeon in September. Patient had a left lumpectomy December 2022.  She finished radiation February 17, 2022.  Per patient around 4-5 lymph nodes was removed. 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  Month ago she had pain and tenderness unable to sleep on the left side or pressure on the breast.  Shooting pains randomly can be sometimes 10/10.   Patient return after doing soft tissue massage and wearing Jovi pack at home for about a month.  Patient reports pain decreased as well as tenderness.  Has here and they are shooting pain is about 8/10 but not daily anymore.  She does not have pain and tenderness during sleeping or hugging her breast. OT remeasured bilateral circumference of upper extremity -still within normal limits compared to the right side.  Patient will continue to wear Jovi pack  preventative for decongesting left quadrant/thoracic to prevent backing up of fluid and upper arm. Fibrosis improved greatly with patient having no issues between 9 and 11:00.  Do have area of fibrotic tissue between 2 and 4:00 on the lateral breast.  Soft tissue mobilization was done by OT including mini massager but also done fibrotic techniques and patient was educated using fibrotic techniques with great success.  Recommend at this stage for patient to use to check back for 2 to 3 weeks under her bra or Jovi pack to break up fibrosis area.  Improving greatly in session.  Patient can also continue with modified MLD using left AI instead  of AAA because of Chemo-Port. Patient can follow-up with me in 2 to 3 weeks again.    OT SCREEN 08/24/22:    Patient was seen for screening the first time in July after referred by [facility] from cancer transition.  Patient was complaining of left breast larger as well as scar tissue and shooting pains 10/10   Patient had a left lumpectomy December 2022.  She finished radiation February 17, 2022.  Per patient around 4-5 lymph nodes was removed. 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  Month ago she had pain and tenderness unable to sleep on the left side or pressure on the breast.  Shooting pains randomly can be sometimes 10/10 since first session.   Pt cont to had 2-4 o'clock fibrosis and hard areas that OT focusing on last time - month ago. Pt picked up chip bag about week ago and used in on lateral side of breast with great success. Pt medial side from 9-11 o'clock has no issues anymore. But cont to have one area at 2 o'clock.   OT remeasured bilateral circumference of upper extremity -still within normal limits compared to the right side.  Patient will continue to wear Jovi pack preventative for decongesting left quadrant/thoracic to prevent backing up of fluid and upper arm.   Soft tissue mobilization was done by OT including mini  massager this date with great success - info provided for pt to get on [place] and use daily until area is softer.  Pt to cont with home program - will see pt in cancer transition class - pt to call or follow up with me as needed - to cont with home program for about month - 6 wks    Improving greatly in session.                                          Visit Diagnosis: Scar condition and fibrosis of skin    Problem List Patient Active Problem List   Diagnosis Date Noted   Aromatase inhibitor use 06/14/2022   Port-A-Cath in place 06/14/2022   Osteopenia 06/14/2022   Vaginal bleeding 12/23/2021   Thrombocytopenia ([place]) 09/15/2021   Chemotherapy-induced neuropathy ([place]) 08/18/2021   Obesity (BMI 30.0-34.9) 08/04/2021   Transaminitis 07/07/2021   Chemotherapy-induced neutropenia ([place]) 07/07/2021   Encounter for antineoplastic chemotherapy 06/08/2021   Genetic testing 05/26/2021   Family history of breast cancer    Goals of care, counseling/discussion 04/29/2021   Anxiety 04/27/2021   BMI 32.0-32.9,adult 04/27/2021   Goiter 04/27/2021   Major depression, single episode 04/27/2021   Vitamin D deficiency 04/27/2021   Breast cancer in female [facility]) 04/21/2021   Arthritis of both knees 11/06/2017    Rosalyn Gess, OTR/L,CLT 08/24/2022, 2:20 PM  [place] at [facility] [address], [place], [place], [address] Phone: [phone]   Fax:  [phone]  Name: Melda Mermelstein MRN: [id number] Date of Birth: [birth year]

## 2022-09-08 ENCOUNTER — Other Ambulatory Visit: Payer: Self-pay | Admitting: General Surgery

## 2022-09-08 NOTE — Progress Notes (Signed)
Progress Notes - documented in this encounter Mayer Vondrak, Geronimo Boot, MD - 09/08/2022 11:00 AM EDT Formatting of this note is different from the original. Images from the original note were not included. Subjective:   Patient ID: Peggy Bates is a 56 y.o. female.  HPI  The following portions of the patient's history were reviewed and updated as appropriate.  This an established patient is here today for: office visit. The patient is here today to follow up on left breast cancer. She had a left breast lumpectomy done December 10, 2021. Patient reports the fluid is still present in the breast. She states she did see Rosalyn Gess, OT for three visits for lymphedema. The most recent being last week.   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 Neo-adjuvant chemotherapy.    Past Surgical History:  Procedure Laterality Date  TRANSCERVICAL UTERINE FIBROID(S) ABLATION 2010  ARTHROSCOPY KNEE Right 2015  MASTECTOMY PARTIAL / LUMPECTOMY Left 12/10/2021  wide excision with needle loc and SLN biopsy Dr Bary Castilla. T0is,NO post neoadjuvant chemotherapy.T1c,N0 pre treatment  TOOTH EXTRACTION    OB History   Gravida  3  Para  3  Term   Preterm   AB   Living     SAB   IAB   Ectopic   Molar   Multiple   Live Births     Obstetric Comments  Age at first period 48 Age of first pregnancy 17      Social History   Socioeconomic History  Marital status: Married  Tobacco Use  Smoking status: Never   Smokeless tobacco: Never  Substance and Sexual Activity  Alcohol use: Yes  Comment: beer occ.  Drug use: Never    No Known Allergies  Current Outpatient Medications  Medication Sig Dispense Refill  acetaminophen (TYLENOL) 650 MG ER tablet Take 650 mg by mouth every 8 (eight) hours as needed for Pain  cetirizine (ZYRTEC) 10 MG tablet Take 10 mg by mouth once daily  cholecalciferol (VITAMIN D3) 1000 unit tablet Take by mouth once daily  diclofenac (VOLTAREN) 1 % topical gel as directed  meloxicam (MOBIC) 15 MG tablet Take 15 mg by mouth once daily with food  ondansetron (ZOFRAN) 8 MG tablet Take by mouth as needed  gabapentin (NEURONTIN) 600 MG tablet Take 1 tablet (600 mg total) by mouth 2 (two) times daily for 90 days 60 tablet 2  HYDROcodone-acetaminophen (NORCO) 5-325 mg tablet TAKE 1 TABLET BY MOUTH EVERY 4 HOURS AS NEEDED FOR MODERATE PAIN (Patient not taking: Reported on 03/08/2022)  lidocaine-prilocaine (EMLA) cream Apply to areola one hour prior to arrival day of procedure. Cover with saran wrap. (Patient not taking: Reported on 06/09/2022) 5 g 0   No current facility-administered medications for this visit.   Family History  Problem Relation Age of Onset  Diabetes Mother  Diabetes Sister  Breast cancer Sister 85  Breast cancer Paternal Grandmother  Colon cancer Neg Hx   Labs and Radiology:   September 08, 2022 left breast  ultrasound:  Ultrasound examination in the axilla shows no evidence of seroma formation. Exam in the area at the wide excision site shows 2 specific components 1) superficially located a 1.2 x 1.9 x 2.2 cm complex cystic lesion about 1 cm below the skin and 2) a deep simple appearing seroma measuring 2.8 x 3.6 x 7.8 cm.  June 13, 2022 laboratory:  Sodium 135 - 145 mmol/L 136  Potassium 3.5 - 5.1 mmol/L 3.9  Chloride 98 - 111 mmol/L 105  CO2 22 - 32 mmol/L 25  Glucose, Bld 70 - 99 mg/dL 92  Comment: Glucose reference range applies only to samples  taken after fasting for at least 8 hours. BUN 6 - 20 mg/dL 18  Creatinine, Ser 0.44 - 1.00 mg/dL 0.84  Calcium 8.9 - 10.3 mg/dL 9.9  Total Protein 6.5 - 8.1 g/dL 8.1  Albumin 3.5 - 5.0 g/dL 4.7  AST 15 - 41 U/L 30  ALT 0 - 44 U/L 26  Alkaline Phosphatase 38 - 126 U/L 72  Total Bilirubin 0.3 - 1.2 mg/dL 0.7  GFR, Estimated >60 mL/min >60  Comment: (NOTE)  Calculated using the CKD-EPI Creatinine Equation (2021)  Anion gap 5 - 15 6   WBC 4.0 - 10.5 K/uL 3.0 Low  RBC 3.87 - 5.11 MIL/uL 4.41  Hemoglobin 12.0 - 15.0 g/dL 12.5  HCT 36.0 - 46.0 % 38.9  MCV 80.0 - 100.0 fL 88.2  MCH 26.0 - 34.0 pg 28.3  MCHC 30.0 - 36.0 g/dL 32.1  RDW 11.5 - 15.5 % 14.0  Platelets 150 - 400 K/uL 180  nRBC 0.0 - 0.2 % 0.0  Neutrophils Relative % % 52  Neutro Abs 1.7 - 7.7 K/uL 1.5 Low  Lymphocytes Relative % 38  Lymphs Abs 0.7 - 4.0 K/uL 1.1  Monocytes Relative % 10  Monocytes Absolute 0.1 - 1.0 K/uL 0.3  Eosinophils Relative % 0  Eosinophils Absolute 0.0 - 0.5 K/uL 0.0  Basophils Relative % 0  Basophils Absolute 0.0 - 0.1 K/uL 0.0  Immature Granulocytes % 0  Abs Immature Granulocytes 0.00 - 0.07 K/uL 0.00   Review of Systems  Constitutional: Negative for chills and fever.  Respiratory: Negative for cough.    Objective:  Physical Exam Exam conducted with a chaperone present.  Constitutional:  Appearance: Normal appearance.  Cardiovascular:  Rate and Rhythm: Normal rate and regular rhythm.  Pulses: Normal pulses.  Heart sounds: Normal heart sounds.  Pulmonary:  Effort: Pulmonary effort is normal.  Breath sounds: Normal breath sounds.  Chest:   Comments: Wide excision site at the upper outer quadrant left breast and axillary incision are well-healed. Mild fullness without focal tenderness in the upper outer quadrant. Good volume preservation. No abnormality of the nipple-areolar complex.  No clinical evidence of upper extremity swelling. Improvement in skin  pigmentation. Musculoskeletal:  Cervical back: Neck supple.  Skin: General: Skin is warm and dry.  Neurological:  Mental Status: She is alert and oriented to person, place, and time.  Psychiatric:  Mood and Affect: Mood normal.  Behavior: Behavior normal.    Assessment:   Persistent seroma post wide excision and whole breast radiation.  Plan:   The patient is 9 months out from surgery, and the deep component is a new finding. I think she will be best served by drainage of both lesions. She is aware that a drain will be placed at the time of surgery.   T applied his note is partially prepared by Ledell Noss, CMA acting  as a scribe in the presence of Dr. Hervey Ard, MD.   The documentation recorded by the scribe accurately reflects the service I personally performed and the decisions made by me.   Robert Bellow, MD FACS   Electronically signed by Mayer Masker, MD at 09/08/2022 1:04 PM EDT

## 2022-09-09 ENCOUNTER — Telehealth: Payer: Self-pay | Admitting: Oncology

## 2022-09-09 NOTE — Telephone Encounter (Signed)
Please postpone appt for 2 week and inform pt of new appts:   Port flush w/Labs MD a few days AFTER labs

## 2022-09-09 NOTE — Telephone Encounter (Signed)
Pt called and stated she is having surgery with Dr. Tollie Pizza on 9/29. She is currently scheduled to see you with labs a few day prior. She wanted to know if she needed to move her appt to before 9/29 or if it should be after the surgery.

## 2022-09-13 ENCOUNTER — Inpatient Hospital Stay

## 2022-09-14 ENCOUNTER — Encounter
Admission: RE | Admit: 2022-09-14 | Discharge: 2022-09-14 | Disposition: A | Discharge status: Home / Self Care | Source: Ambulatory Visit | Attending: General Surgery | Admitting: General Surgery

## 2022-09-14 NOTE — Patient Instructions (Addendum)
Your procedure is scheduled on: Friday September 16, 2022. Report to Day Surgery inside [facility] 2nd floor, stop by registration desk before getting on elevator. To find out your arrival time please call [phone] between 1PM - 3PM on Thursday September 15, 2022.  Remember: Instructions that are not followed completely may result in serious medical risk,  up to and including death, or upon the discretion of your surgeon and anesthesiologist your  surgery may need to be rescheduled.     _X__ 1. Do not eat food after midnight the night before your procedure.                 No chewing gum or hard candies. You may drink clear liquids up to 2 hours                 before you are scheduled to arrive for your surgery- DO not drink clear                 liquids within 2 hours of the start of your surgery.                 Clear Liquids include:  water, apple juice without pulp, clear Gatorade, G2 or                  Gatorade Zero (avoid Red/Purple/Blue), Black Coffee or Tea (Do not add                 anything to coffee or tea).  __X__2.  On the morning of surgery brush your teeth with toothpaste and water, you                may rinse your mouth with mouthwash if you wish.  Do not swallow any toothpaste or mouthwash.     _X__ 3.  No Alcohol for 24 hours before or after surgery.   _X__ 4.  Do Not Smoke or use e-cigarettes For 24 Hours Prior to Your Surgery.                 Do not use any chewable tobacco products for at least 6 hours prior to                 Surgery.  _X__  5.  Do not use any recreational drugs (marijuana, cocaine, heroin, ecstasy, MDMA or other)                For at least one week prior to your surgery.  Combination of these drugs with anesthesia                May have life threatening results.  ____  6.  Bring all medications with you on the day of surgery if instructed.   __X__  7.  Notify your doctor if there is any change in your medical  condition      (cold, fever, infections).     Do not wear jewelry, make-up, hairpins, clips or nail polish. Do not wear lotions, powders, or perfumes or deodorant. Do not shave 48 hours prior to surgery. Men may shave face and neck. Do not bring valuables to the hospital.    [facility] is not responsible for any belongings or valuables.  Contacts, dentures or bridgework may not be worn into surgery. Leave your suitcase in the car. After surgery it may be brought to your room. For patients admitted to the hospital, discharge time is determined by your  treatment team.   Patients discharged the day of surgery will not be allowed to drive home.   Make arrangements for someone to be with you for the first 24 hours of your Same Day Discharge.   __X__ Take these medicines the morning of surgery with A SIP OF WATER:    1. gabapentin (NEURONTIN) 600 MG   2. letrozole (FEMARA) 2.5 MG tablet  3.   4.  5.  6.  ____ Fleet Enema (as directed)   __X__ Use CHG Soap (or wipes) as directed  ____ Use Benzoyl Peroxide Gel as instructed  ____ Use inhalers on the day of surgery  ____ Stop metformin 2 days prior to surgery    ____ Take 1/2 of usual insulin dose the night before surgery. No insulin the morning          of surgery.   ____ Call your PCP, cardiologist, or Pulmonologist if taking Coumadin/Plavix/aspirin and ask when to stop before your surgery.   __X__ One Week prior to surgery- Stop Anti-inflammatories such as Ibuprofen, Aleve, Advil, Motrin, meloxicam (MOBIC), diclofenac, etodolac, ketorolac, Toradol, Daypro, piroxicam, Goody's or BC powders. OK TO USE TYLENOL IF NEEDED   __X__ Stop supplements until after surgery.    ____ Bring C-Pap to the hospital.    If you have any questions regarding your pre-procedure instructions,  Please call Pre-admit Testing at [phone]

## 2022-09-15 MED ORDER — ORAL CARE MOUTH RINSE: 15.0000 mL | Freq: Once | OROMUCOSAL | Status: AC

## 2022-09-15 MED ORDER — LACTATED RINGERS IV SOLN: INTRAVENOUS | Status: DC

## 2022-09-15 MED ORDER — CHLORHEXIDINE GLUCONATE 0.12 % MT SOLN: 15.0000 mL | Freq: Once | OROMUCOSAL | Status: AC

## 2022-09-15 MED ORDER — CEFAZOLIN SODIUM-DEXTROSE 2-4 GM/100ML-% IV SOLN
2.0000 g | INTRAVENOUS | Status: AC
  Administered 2022-09-16: 2 g via INTRAVENOUS

## 2022-09-15 MED ORDER — CHLORHEXIDINE GLUCONATE CLOTH 2 % EX PADS
6.0000 | MEDICATED_PAD | Freq: Once | CUTANEOUS | Status: AC
  Administered 2022-09-16: 6 via TOPICAL

## 2022-09-15 MED ORDER — FAMOTIDINE 20 MG PO TABS: 20.0000 mg | ORAL_TABLET | Freq: Once | ORAL | Status: AC

## 2022-09-15 MED ORDER — CHLORHEXIDINE GLUCONATE CLOTH 2 % EX PADS: 6.0000 | MEDICATED_PAD | Freq: Once | CUTANEOUS | Status: DC

## 2022-09-16 ENCOUNTER — Ambulatory Visit
Admission: RE | Admit: 2022-09-16 | Discharge: 2022-09-16 | Disposition: A | Discharge status: Home / Self Care | Attending: General Surgery | Admitting: General Surgery

## 2022-09-16 ENCOUNTER — Other Ambulatory Visit: Payer: Self-pay

## 2022-09-16 ENCOUNTER — Ambulatory Visit: Admitting: Certified Registered Nurse Anesthetist

## 2022-09-16 ENCOUNTER — Encounter
Admission: RE | Disposition: A | Discharge status: Home / Self Care | Payer: Self-pay | Source: Home / Self Care | Attending: General Surgery

## 2022-09-16 ENCOUNTER — Encounter: Payer: Self-pay | Admitting: General Surgery

## 2022-09-16 ENCOUNTER — Inpatient Hospital Stay: Admitting: Oncology

## 2022-09-16 DIAGNOSIS — Z923 Personal history of irradiation: Secondary | ICD-10-CM | POA: Insufficient documentation

## 2022-09-16 DIAGNOSIS — Z9221 Personal history of antineoplastic chemotherapy: Secondary | ICD-10-CM | POA: Diagnosis not present

## 2022-09-16 DIAGNOSIS — N6489 Other specified disorders of breast: Secondary | ICD-10-CM | POA: Diagnosis present

## 2022-09-16 DIAGNOSIS — Z853 Personal history of malignant neoplasm of breast: Secondary | ICD-10-CM | POA: Diagnosis not present

## 2022-09-16 HISTORY — PX: INCISION AND DRAINAGE ABSCESS: SHX5864

## 2022-09-16 SURGERY — INCISION AND DRAINAGE, ABSCESS
Anesthesia: General | Site: Breast | Laterality: Left

## 2022-09-16 MED ORDER — KETOROLAC TROMETHAMINE 30 MG/ML IJ SOLN
INTRAMUSCULAR | Status: AC
  Filled 2022-09-16: qty 1

## 2022-09-16 MED ORDER — ACETAMINOPHEN 10 MG/ML IV SOLN
INTRAVENOUS | Status: DC | PRN
  Administered 2022-09-16: 1000 mg via INTRAVENOUS

## 2022-09-16 MED ORDER — ACETAMINOPHEN 10 MG/ML IV SOLN
INTRAVENOUS | Status: AC
  Filled 2022-09-16: qty 100

## 2022-09-16 MED ORDER — FENTANYL CITRATE (PF) 100 MCG/2ML IJ SOLN
INTRAMUSCULAR | Status: AC
  Filled 2022-09-16: qty 2

## 2022-09-16 MED ORDER — ONDANSETRON HCL 4 MG/2ML IJ SOLN: 4.0000 mg | Freq: Once | INTRAMUSCULAR | Status: DC | PRN

## 2022-09-16 MED ORDER — MIDAZOLAM HCL 2 MG/2ML IJ SOLN
INTRAMUSCULAR | Status: AC
  Filled 2022-09-16: qty 2

## 2022-09-16 MED ORDER — BUPIVACAINE-EPINEPHRINE (PF) 0.5% -1:200000 IJ SOLN
INTRAMUSCULAR | Status: AC
  Filled 2022-09-16: qty 30

## 2022-09-16 MED ORDER — BUPIVACAINE-EPINEPHRINE 0.5% -1:200000 IJ SOLN
INTRAMUSCULAR | Status: DC | PRN
  Administered 2022-09-16: 8 mL

## 2022-09-16 MED ORDER — LIDOCAINE HCL (PF) 2 % IJ SOLN
INTRAMUSCULAR | Status: AC
  Filled 2022-09-16: qty 10

## 2022-09-16 MED ORDER — FENTANYL CITRATE (PF) 100 MCG/2ML IJ SOLN
INTRAMUSCULAR | Status: AC
  Administered 2022-09-16: 25 ug via INTRAVENOUS
  Filled 2022-09-16: qty 2

## 2022-09-16 MED ORDER — MIDAZOLAM HCL 2 MG/2ML IJ SOLN
INTRAMUSCULAR | Status: DC | PRN
  Administered 2022-09-16: 2 mg via INTRAVENOUS

## 2022-09-16 MED ORDER — PROPOFOL 500 MG/50ML IV EMUL
INTRAVENOUS | Status: DC | PRN
  Administered 2022-09-16: 50 ug/kg/min via INTRAVENOUS

## 2022-09-16 MED ORDER — PROPOFOL 10 MG/ML IV BOLUS
INTRAVENOUS | Status: DC | PRN
  Administered 2022-09-16 (×3): 20 mg via INTRAVENOUS

## 2022-09-16 MED ORDER — KETOROLAC TROMETHAMINE 30 MG/ML IJ SOLN
30.0000 mg | Freq: Once | INTRAMUSCULAR | Status: AC
  Administered 2022-09-16: 30 mg via INTRAVENOUS

## 2022-09-16 MED ORDER — PROPOFOL 10 MG/ML IV BOLUS
INTRAVENOUS | Status: AC
  Filled 2022-09-16: qty 40

## 2022-09-16 MED ORDER — ONDANSETRON HCL 4 MG/2ML IJ SOLN
INTRAMUSCULAR | Status: AC
  Filled 2022-09-16: qty 4

## 2022-09-16 MED ORDER — GLYCOPYRROLATE 0.2 MG/ML IJ SOLN
INTRAMUSCULAR | Status: AC
  Filled 2022-09-16: qty 1

## 2022-09-16 MED ORDER — PROPOFOL 1000 MG/100ML IV EMUL
INTRAVENOUS | Status: AC
  Filled 2022-09-16: qty 100

## 2022-09-16 MED ORDER — HYDROCODONE-ACETAMINOPHEN 5-325 MG PO TABS: 1.0000 | ORAL_TABLET | ORAL | 0 refills | Status: DC | PRN

## 2022-09-16 MED ORDER — CHLORHEXIDINE GLUCONATE 0.12 % MT SOLN
OROMUCOSAL | Status: AC
  Administered 2022-09-16: 15 mL via OROMUCOSAL
  Filled 2022-09-16: qty 15

## 2022-09-16 MED ORDER — DEXAMETHASONE SODIUM PHOSPHATE 10 MG/ML IJ SOLN
INTRAMUSCULAR | Status: AC
  Filled 2022-09-16: qty 2

## 2022-09-16 MED ORDER — FAMOTIDINE 20 MG PO TABS
ORAL_TABLET | ORAL | Status: AC
  Administered 2022-09-16: 20 mg via ORAL
  Filled 2022-09-16: qty 1

## 2022-09-16 MED ORDER — STERILE WATER FOR IRRIGATION IR SOLN
Status: DC | PRN
  Administered 2022-09-16: 400 mL

## 2022-09-16 MED ORDER — KETOROLAC TROMETHAMINE 30 MG/ML IJ SOLN
INTRAMUSCULAR | Status: AC
  Filled 2022-09-16: qty 2

## 2022-09-16 MED ORDER — FENTANYL CITRATE (PF) 100 MCG/2ML IJ SOLN
25.0000 ug | INTRAMUSCULAR | Status: DC | PRN
  Administered 2022-09-16 (×3): 25 ug via INTRAVENOUS

## 2022-09-16 MED ORDER — CEFAZOLIN SODIUM-DEXTROSE 2-4 GM/100ML-% IV SOLN
INTRAVENOUS | Status: AC
  Filled 2022-09-16: qty 100

## 2022-09-16 MED ORDER — FENTANYL CITRATE (PF) 100 MCG/2ML IJ SOLN
INTRAMUSCULAR | Status: DC | PRN
  Administered 2022-09-16 (×2): 50 ug via INTRAVENOUS

## 2022-09-16 SURGICAL SUPPLY — 36 items
APL PRP STRL LF DISP 70% ISPRP (MISCELLANEOUS) ×1
BINDER BREAST XLRG (GAUZE/BANDAGES/DRESSINGS) IMPLANT
BLADE SURG 15 STRL SS SAFETY (BLADE) ×1 IMPLANT
BULB RESERV EVAC DRAIN JP 100C (MISCELLANEOUS) IMPLANT
CATH ROBINSON RED 14FR (CATHETERS) IMPLANT
CATH URET ROBINSON RED 14FR (CATHETERS) ×1
CHLORAPREP W/TINT 26 (MISCELLANEOUS) ×1 IMPLANT
DRAIN CHANNEL JP 15F RND 16 (MISCELLANEOUS) IMPLANT
DRAPE LAPAROTOMY 100X77 ABD (DRAPES) ×1 IMPLANT
DRSG GAUZE FLUFF 36X18 (GAUZE/BANDAGES/DRESSINGS) IMPLANT
DRSG TELFA 3X4 N-ADH STERILE (GAUZE/BANDAGES/DRESSINGS) ×1 IMPLANT
ELECT REM PT RETURN 9FT ADLT (ELECTROSURGICAL) ×1
ELECTRODE REM PT RTRN 9FT ADLT (ELECTROSURGICAL) ×1 IMPLANT
GAUZE 4X4 16PLY ~~LOC~~+RFID DBL (SPONGE) ×1 IMPLANT
GLOVE BIO SURGEON STRL SZ7.5 (GLOVE) ×1 IMPLANT
GLOVE SURG UNDER LTX SZ8 (GLOVE) ×1 IMPLANT
GOWN STRL REUS W/ TWL LRG LVL3 (GOWN DISPOSABLE) ×2 IMPLANT
GOWN STRL REUS W/TWL LRG LVL3 (GOWN DISPOSABLE) ×2
KIT TURNOVER KIT A (KITS) ×1 IMPLANT
LABEL OR SOLS (LABEL) ×1 IMPLANT
MANIFOLD NEPTUNE II (INSTRUMENTS) ×1 IMPLANT
NDL HYPO 25X1 1.5 SAFETY (NEEDLE) ×1 IMPLANT
NEEDLE HYPO 22GX1.5 SAFETY (NEEDLE) ×1 IMPLANT
NEEDLE HYPO 25X1 1.5 SAFETY (NEEDLE) IMPLANT
PACK BASIN MINOR ARMC (MISCELLANEOUS) ×1 IMPLANT
STRIP CLOSURE SKIN 1/2X4 (GAUZE/BANDAGES/DRESSINGS) ×1 IMPLANT
SUT ETHILON 3-0 FS-10 30 BLK (SUTURE)
SUT VIC AB 3-0 SH 27 (SUTURE)
SUT VIC AB 3-0 SH 27X BRD (SUTURE) ×2 IMPLANT
SUT VIC AB 4-0 SH 27 (SUTURE) ×1
SUT VIC AB 4-0 SH 27XANBCTRL (SUTURE) IMPLANT
SUTURE EHLN 3-0 FS-10 30 BLK (SUTURE) IMPLANT
SWABSTK COMLB BENZOIN TINCTURE (MISCELLANEOUS) ×1 IMPLANT
SYR 10ML LL (SYRINGE) ×1 IMPLANT
TRAP FLUID SMOKE EVACUATOR (MISCELLANEOUS) ×1 IMPLANT
WATER STERILE IRR 500ML POUR (IV SOLUTION) ×1 IMPLANT

## 2022-09-16 NOTE — H&P (Signed)
Peggy Bates [id number] Dec 27, 1965     HPI:  Persistent left breast seroma 10 months s/p wide excision post neoadjuvant chemotherapy. For drainage.   Medications Prior to Admission  Medication Sig Dispense Refill Last Dose   acetaminophen (TYLENOL) 650 MG CR tablet Take 1,300 mg by mouth See admin instructions. Take 1300 mg in the morning may take a second 1300 mg dose during the day as needed for pain   09/16/2022 at 0330   calcium carbonate (OS-CAL - DOSED IN MG OF ELEMENTAL CALCIUM) 1250 (500 Ca) MG tablet Take 1 tablet by mouth.   09/14/2022   cholecalciferol (VITAMIN D) 25 MCG (1000 UNIT) tablet Take 1,000 Units by mouth daily.   09/14/2022   gabapentin (NEURONTIN) 600 MG tablet Take 600 mg by mouth every morning.   09/16/2022 at 0330   letrozole (FEMARA) 2.5 MG tablet Take 1 tablet (2.5 mg total) by mouth daily. 30 tablet 6 09/16/2022 at 0330   meloxicam (MOBIC) 15 MG tablet Take 15 mg by mouth daily.   Past Week   Cyanocobalamin (VITAMIN B 12 PO) Take 1,000 mg by mouth.   09/14/2022   diclofenac Sodium (VOLTAREN) 1 % GEL Apply 1 application topically daily.   09/14/2022   Multiple Vitamins-Minerals (ONE-A-DAY ENERGY PO) Take by mouth.   09/14/2022   phentermine (ADIPEX-P) 37.5 MG tablet Take 18.75 mg by mouth daily.   09/14/2022   silver sulfADIAZINE (SILVADENE) 1 % cream Apply 1 application topically 2 (two) times daily. (Patient not taking: Reported on 09/14/2022) 50 g 3    No Known Allergies Past Medical History:  Diagnosis Date   Arthritis    knees   Breast cancer ([place])    2022   COVID-19 12/2020   Depression    Family history of breast cancer    Goiter    Malignant neoplasm of left female breast ([place]) 04/07/2021   Personal history of chemotherapy    Personal history of radiation therapy    Past Surgical History:  Procedure Laterality Date   ADJACENT TISSUE TRANSFER/TISSUE REARRANGEMENT Left 12/10/2021   Procedure: ADJACENT TISSUE TRANSFER/TISSUE REARRANGEMENT;  Surgeon:  Robert Bellow, MD;  Location: [facility];  Service: General;  Laterality: Left;   AXILLARY SENTINEL NODE BIOPSY Left 12/10/2021   Procedure: AXILLARY SENTINEL NODE BIOPSY;  Surgeon: Robert Bellow, MD;  Location: [facility];  Service: General;  Laterality: Left;   BREAST BIOPSY Left 04/07/2021   [place] in [place]:u/s bx triple neg   BREAST BIOPSY Left 05/20/2021   [place] Axilla Bx, hydro 3 marker, neg   BREAST LUMPECTOMY WITH NEEDLE LOCALIZATION Left 12/10/2021   Procedure: BREAST LUMPECTOMY WITH NEEDLE LOCALIZATION, PECTORAL BLOCK;  Surgeon: Robert Bellow, MD;  Location: [facility];  Service: General;  Laterality: Left;   KNEE ARTHROSCOPY Right 2008   PORTACATH PLACEMENT Right 05/07/2021   Procedure: INSERTION PORT-A-CATH;  Surgeon: Robert Bellow, MD;  Location: [facility];  Service: General;  Laterality: Right;   TOOTH EXTRACTION     TRANSCERVICAL UTERINE FIBROID(S) ABLATION  2010   Social History   Socioeconomic History   Marital status: Married    Spouse name: Not on file   Number of children: Not on file   Years of education: Not on file   Highest education level: Not on file  Occupational History   Not on file  Tobacco Use   Smoking status: Never   Smokeless tobacco: Never  Vaping Use   Vaping Use: Never used  Substance  and Sexual Activity   Alcohol use: Never   Drug use: Never   Sexual activity: Yes  Other Topics Concern   Not on file  Social History Narrative   ** Merged History Encounter **       Social Determinants of Health   Financial Resource Strain: Not on file  Food Insecurity: Not on file  Transportation Needs: Not on file  Physical Activity: Not on file  Stress: Not on file  Social Connections: Not on file  Intimate Partner Violence: Not on file   Social History   Social History Narrative   ** Merged History Encounter **         ROS: Negative.     PE: HEENT: Negative. Lungs: Clear. Cardio:  RR.  Assessment/Plan:  Proceed with planned seroma drainage.    Forest Gleason [facility] 09/16/2022

## 2022-09-16 NOTE — Transfer of Care (Signed)
Immediate Anesthesia Transfer of Care Note  Patient: Peggy Bates  Procedure(s) Performed: INCISION AND DRAINAGE ABSCESS (Left: Breast)  Patient Location: PACU  Anesthesia Type:MAC   Level of Consciousness: awake, alert  and oriented  Airway & Oxygen Therapy: Patient Spontanous Breathing and Patient connected to face mask oxygen  Post-op Assessment: Report given to RN and Post -op Vital signs reviewed and stable  Post vital signs: Reviewed and stable  Last Vitals:  Vitals Value Taken Time  BP 122/81 09/16/22 0822  Temp    Pulse 67 09/16/22 0825  Resp    SpO2 100 % 09/16/22 0825  Vitals shown include unvalidated device data.  Last Pain:  Vitals:   09/16/22 0616  PainSc: 0-No pain         Complications: No notable events documented.

## 2022-09-16 NOTE — Anesthesia Preprocedure Evaluation (Signed)
Anesthesia Evaluation  Patient identified by MRN, date of birth, ID band Patient awake    Reviewed: Allergy & Precautions, H&P , NPO status , Patient's Chart, lab work & pertinent test results, reviewed documented beta blocker date and time   Airway Mallampati: II   Neck ROM: full    Dental  (+) Poor Dentition   Pulmonary neg pulmonary ROS,    Pulmonary exam normal        Cardiovascular negative cardio ROS Normal cardiovascular exam Rhythm:regular Rate:Normal     Neuro/Psych PSYCHIATRIC DISORDERS Anxiety Depression  Neuromuscular disease    GI/Hepatic negative GI ROS, Neg liver ROS,   Endo/Other  negative endocrine ROS  Renal/GU negative Renal ROS  negative genitourinary   Musculoskeletal   Abdominal   Peds  Hematology negative hematology ROS (+)   Anesthesia Other Findings Past Medical History: No date: Arthritis     Comment:  knees No date: Breast cancer ([place])     Comment:  2022 12/2020: COVID-19 No date: Depression No date: Family history of breast cancer No date: Goiter 04/07/2021: Malignant neoplasm of left female breast ([place]) No date: Personal history of chemotherapy No date: Personal history of radiation therapy Past Surgical History: 12/10/2021: ADJACENT TISSUE TRANSFER/TISSUE REARRANGEMENT; Left     Comment:  Procedure: ADJACENT TISSUE TRANSFER/TISSUE               REARRANGEMENT;  Surgeon: Robert Bellow, MD;                Location: [facility];  Service: General;  Laterality: Left; 12/10/2021: AXILLARY SENTINEL NODE BIOPSY; Left     Comment:  Procedure: AXILLARY SENTINEL NODE BIOPSY;  Surgeon:               Robert Bellow, MD;  Location: [facility];  Service:               General;  Laterality: Left; 04/07/2021: BREAST BIOPSY; Left     Comment:  [place] in [place]:u/s bx triple neg 05/20/2021: BREAST BIOPSY; Left     Comment:  [place] Axilla Bx, hydro 3 marker, neg 12/10/2021:  BREAST LUMPECTOMY WITH NEEDLE LOCALIZATION; Left     Comment:  Procedure: BREAST LUMPECTOMY WITH NEEDLE LOCALIZATION,               PECTORAL BLOCK;  Surgeon: Robert Bellow, MD;                Location: [facility];  Service: General;  Laterality: Left; 2008: KNEE ARTHROSCOPY; Right 05/07/2021: PORTACATH PLACEMENT; Right     Comment:  Procedure: INSERTION PORT-A-CATH;  Surgeon: Robert Bellow, MD;  Location: [facility];  Service: General;                Laterality: Right; No date: TOOTH EXTRACTION 2010: TRANSCERVICAL UTERINE FIBROID(S) ABLATION BMI    Body Mass Index: 33.99 kg/m     Reproductive/Obstetrics negative OB ROS                             Anesthesia Physical Anesthesia Plan  ASA: 3  Anesthesia Plan: General   Post-op Pain Management:    Induction:   PONV Risk Score and Plan:   Airway Management Planned:   Additional Equipment:   Intra-op Plan:   Post-operative Plan:   Informed Consent: I have reviewed the patients History  and Physical, chart, labs and discussed the procedure including the risks, benefits and alternatives for the proposed anesthesia with the patient or authorized representative who has indicated his/her understanding and acceptance.     Dental Advisory Given  Plan Discussed with: CRNA  Anesthesia Plan Comments:         Anesthesia Quick Evaluation

## 2022-09-16 NOTE — Anesthesia Procedure Notes (Signed)
Date/Time: 09/16/2022 7:45 AM  Performed by: Demetrius Charity, CRNAPre-anesthesia Checklist: Patient identified, Emergency Drugs available, Suction available, Patient being monitored and Timeout performed Patient Re-evaluated:Patient Re-evaluated prior to induction Oxygen Delivery Method: Simple face mask Induction Type: IV induction Placement Confirmation: CO2 detector and positive ETCO2

## 2022-09-16 NOTE — Op Note (Signed)
Preoperative diagnosis: Persistent seroma left breast.  Postoperative diagnosis: Same.  Operative procedure: Ultrasound-guided drainage of left breast seroma/hematoma.  Operating surgeon: Hervey Ard, MD.  Anesthesia: Monitored anesthesia care 0.5% Marcaine with 1: 200,000's of epinephrine, 10 8 cc.  Estimated blood loss: Less than 5 cc.  Clinical note: This 56 year old woman is 10 months status post breast conservation surgery having undergone preoperative neoadjuvant chemotherapy.  She is completed whole breast radiation.  She has had a persistent superficial seroma and recently was noted with evidence of a deep seroma.  She was felt to be a candidate for formal drainage.  Operative note: The patient received Ancef 2 g intravenously on induction of anesthesia.  The breast was taped to the right and then cleansed with ChloraPrep and draped.  Ultrasound was used to identify the 2 cavities, 1 superficial and a complex cystic lesion and the other large unilocular pocket which appeared to be a simple seroma.  The deep lesion was aspirated and yielded only a small quantity of blood.  Local anesthesia was infiltrated and a 1 cm incision was made.  A hemostat was then advanced into the cavity and draining a moderate amount of liquefied blood.  No odor.  Culture obtained.  The Yankauer suction was placed into the cavity to evacuate this with good result and then a curette was used to debride the cavity.  Red rubber catheter was inserted and the area was irrigated until clear.  A Blake drain was then advanced under ultrasound guidance with exit just lateral to the areola at the 3 o'clock position.  This was subsequently anchored into position with a 3-0 nylon suture.  The more superficial area was superior and medial to the deep fluid collection.  A small incision was made in the cavity aspirated with complete resolution.  Both superficial wounds were closed with 4-0 Vicryl subcuticular sutures.  Benzoin  and Steri-Strips were applied to the incision sites fluff gauze followed by compressive wrap was applied to the breast.  Patient was taken to the PACU in stable condition.

## 2022-09-16 NOTE — Discharge Instructions (Signed)

## 2022-09-20 NOTE — Anesthesia Postprocedure Evaluation (Signed)
Anesthesia Post Note  Patient: Peggy Bates  Procedure(s) Performed: INCISION AND DRAINAGE ABSCESS (Left: Breast)  Patient location during evaluation: PACU Anesthesia Type: General Level of consciousness: awake and alert Pain management: pain level controlled Vital Signs Assessment: post-procedure vital signs reviewed and stable Respiratory status: spontaneous breathing, nonlabored ventilation, respiratory function stable and patient connected to nasal cannula oxygen Cardiovascular status: blood pressure returned to baseline and stable Postop Assessment: no apparent nausea or vomiting Anesthetic complications: no   No notable events documented.   Last Vitals:  Vitals:   09/16/22 0850 09/16/22 0900  BP:  123/89  Pulse: 61 60  Resp:  18  Temp:  (!) 36.3 C  SpO2: 95% 98%    Last Pain:  Vitals:   09/17/22 1641  PainSc: [name]

## 2022-09-21 LAB — AEROBIC/ANAEROBIC CULTURE W GRAM STAIN (SURGICAL/DEEP WOUND)
Culture: NO GROWTH
Gram Stain: NONE SEEN

## 2022-09-22 ENCOUNTER — Ambulatory Visit
Admission: RE | Admit: 2022-09-22 | Discharge: 2022-09-22 | Disposition: A | Discharge status: Home / Self Care | Source: Ambulatory Visit | Attending: Radiation Oncology | Admitting: Radiation Oncology

## 2022-09-22 ENCOUNTER — Encounter: Payer: Self-pay | Admitting: Radiation Oncology

## 2022-09-22 VITALS — BP 113/55 | HR 74 | Temp 96.3°F | Resp 16 | Wt 218.0 lb

## 2022-09-22 DIAGNOSIS — Z923 Personal history of irradiation: Secondary | ICD-10-CM | POA: Diagnosis not present

## 2022-09-22 DIAGNOSIS — Z853 Personal history of malignant neoplasm of breast: Secondary | ICD-10-CM | POA: Insufficient documentation

## 2022-09-22 DIAGNOSIS — Z171 Estrogen receptor negative status [ER-]: Secondary | ICD-10-CM

## 2022-09-22 NOTE — Progress Notes (Signed)
Radiation Oncology Follow up Note  Name: Peggy Bates   Date:   09/22/2022 MRN:  [id number] DOB: 07-31-1966    This 56 y.o. 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  REFERRING PROVIDER: SRenee Rival NP  HPI: Patient is a 56year old female now at 6 months having completed whole breast radiation to her left breast for triple negative invasive mammary carcinoma stage IIa.  Seen today in routine follow-up she is doing fairly well.  She recently had drainage of a seroma of her left breast showing no organisms seen.  She is doing well status post that procedure.  She is not on endocrine therapy based on the triple negative nature of her disease.  She did mammogram back in May which I reviewed shows heterogeneous fluid collection consistent with seroma no evidence of malignancy BI-RADS 2. COMPLICATIONS OF TREATMENT: none  FOLLOW UP COMPLIANCE: keeps appointments   PHYSICAL EXAM:  BP (!) 113/55 (BP Location: Left Arm, Patient Position: Sitting)   Pulse 74   Temp (!) 96.3 F (35.7 C) (Tympanic)   Resp 16   Wt 218 lb (98.9 kg)   LMP  (LMP Unknown)   SpO2 100%   BMI 34.14 kg/m  Patient is recently undergone drainage of her seroma no dominant masses noted in either breast.  No axillary or supraclavicular adenopathy is identified.  Well-developed well-nourished patient in NAD. HEENT reveals PERLA, EOMI, discs not visualized.  Oral cavity is clear. No oral mucosal lesions are identified. Neck is clear without evidence of cervical or supraclavicular adenopathy. Lungs are clear to A&P. Cardiac examination is essentially unremarkable with regular rate and rhythm without murmur rub or thrill. Abdomen is benign with no organomegaly or masses noted. Motor sensory and DTR levels are equal and symmetric in the upper and lower  extremities. Cranial nerves II through XII are grossly intact. Proprioception is intact. No peripheral adenopathy or edema is identified. No motor or sensory levels are noted. Crude visual fields are within normal range.  RADIOLOGY RESULTS: Mammogram and ultrasound reviewed compatible with above-stated findings  PLAN: The present time patient is doing well with no evidence of disease now at 6 months and pleased with her overall progress.  Of asked to see her back in 6 months for follow-up and then will start once year follow-up visits.  Patient knows to call with any concerns.  I would like to take this opportunity to thank you for allowing me to participate in the care of your patient..Noreene Filbert MD

## 2022-09-27 ENCOUNTER — Inpatient Hospital Stay: Attending: Oncology

## 2022-09-27 DIAGNOSIS — Z95828 Presence of other vascular implants and grafts: Secondary | ICD-10-CM

## 2022-09-27 DIAGNOSIS — Z79811 Long term (current) use of aromatase inhibitors: Secondary | ICD-10-CM | POA: Diagnosis not present

## 2022-09-27 DIAGNOSIS — M8580 Other specified disorders of bone density and structure, unspecified site: Secondary | ICD-10-CM | POA: Diagnosis present

## 2022-09-27 DIAGNOSIS — D696 Thrombocytopenia, unspecified: Secondary | ICD-10-CM

## 2022-09-27 DIAGNOSIS — C50412 Malignant neoplasm of upper-outer quadrant of left female breast: Secondary | ICD-10-CM | POA: Insufficient documentation

## 2022-09-27 DIAGNOSIS — G620 Drug-induced polyneuropathy: Secondary | ICD-10-CM | POA: Diagnosis not present

## 2022-09-27 DIAGNOSIS — Z923 Personal history of irradiation: Secondary | ICD-10-CM | POA: Diagnosis not present

## 2022-09-27 DIAGNOSIS — T451X5D Adverse effect of antineoplastic and immunosuppressive drugs, subsequent encounter: Secondary | ICD-10-CM | POA: Diagnosis not present

## 2022-09-27 DIAGNOSIS — Z803 Family history of malignant neoplasm of breast: Secondary | ICD-10-CM | POA: Insufficient documentation

## 2022-09-27 DIAGNOSIS — R5383 Other fatigue: Secondary | ICD-10-CM | POA: Diagnosis not present

## 2022-09-27 DIAGNOSIS — Z452 Encounter for adjustment and management of vascular access device: Secondary | ICD-10-CM | POA: Insufficient documentation

## 2022-09-27 DIAGNOSIS — Z800 Family history of malignant neoplasm of digestive organs: Secondary | ICD-10-CM | POA: Insufficient documentation

## 2022-09-27 DIAGNOSIS — Z171 Estrogen receptor negative status [ER-]: Secondary | ICD-10-CM | POA: Insufficient documentation

## 2022-09-27 LAB — COMPREHENSIVE METABOLIC PANEL
ALT: 28 U/L (ref 0–44)
AST: 27 U/L (ref 15–41)
Albumin: 4.5 g/dL (ref 3.5–5.0)
Alkaline Phosphatase: 79 U/L (ref 38–126)
Anion gap: 5 (ref 5–15)
BUN: 15 mg/dL (ref 6–20)
CO2: 27 mmol/L (ref 22–32)
Calcium: 9.8 mg/dL (ref 8.9–10.3)
Chloride: 104 mmol/L (ref 98–111)
Creatinine, Ser: 0.65 mg/dL (ref 0.44–1.00)
GFR, Estimated: >60 mL/min (ref >60)
Glucose, Bld: 96 mg/dL (ref 70–99)
Potassium: 4.2 mmol/L (ref 3.5–5.1)
Sodium: 136 mmol/L (ref 135–145)
Total Bilirubin: 0.7 mg/dL (ref 0.3–1.2)
Total Protein: 8.2 g/dL — ABNORMAL HIGH (ref 6.5–8.1)

## 2022-09-27 LAB — CBC WITH DIFFERENTIAL/PLATELET
Abs Immature Granulocytes: 0 10*3/uL (ref 0.00–0.07)
Basophils Absolute: 0 10*3/uL (ref 0.0–0.1)
Basophils Relative: 1 %
Eosinophils Absolute: 0 10*3/uL (ref 0.0–0.5)
Eosinophils Relative: 1 %
HCT: 40.1 % (ref 36.0–46.0)
Hemoglobin: 13 g/dL (ref 12.0–15.0)
Immature Granulocytes: 0 %
Lymphocytes Relative: 43 %
Lymphs Abs: 1 10*3/uL (ref 0.7–4.0)
MCH: 28.6 pg (ref 26.0–34.0)
MCHC: 32.4 g/dL (ref 30.0–36.0)
MCV: 88.3 fL (ref 80.0–100.0)
Monocytes Absolute: 0.3 10*3/uL (ref 0.1–1.0)
Monocytes Relative: 12 %
Neutro Abs: 1 10*3/uL — ABNORMAL LOW (ref 1.7–7.7)
Neutrophils Relative %: 43 %
Platelets: 183 10*3/uL (ref 150–400)
RBC: 4.54 MIL/uL (ref 3.87–5.11)
RDW: 13.4 % (ref 11.5–15.5)
WBC: 2.2 10*3/uL — ABNORMAL LOW (ref 4.0–10.5)
nRBC: 0 % (ref 0.0–0.2)

## 2022-09-27 MED ORDER — SODIUM CHLORIDE 0.9% FLUSH
10.0000 mL | Freq: Once | INTRAVENOUS | Status: AC
  Administered 2022-09-27: 10 mL via INTRAVENOUS
  Filled 2022-09-27: qty 10

## 2022-09-27 MED ORDER — HEPARIN SOD (PORK) LOCK FLUSH 100 UNIT/ML IV SOLN
500.0000 [IU] | Freq: Once | INTRAVENOUS | Status: AC
  Administered 2022-09-27: 500 [IU] via INTRAVENOUS
  Filled 2022-09-27: qty 5

## 2022-09-29 LAB — CANCER ANTIGEN 15-3: CA 15-3: 6.1 U/mL (ref 0.0–25.0)

## 2022-09-29 LAB — CANCER ANTIGEN 27.29: CA 27.29: <9 U/mL (ref 0.0–38.6)

## 2022-10-03 ENCOUNTER — Inpatient Hospital Stay: Admitting: Oncology

## 2022-10-03 ENCOUNTER — Encounter: Payer: Self-pay | Admitting: Oncology

## 2022-10-03 ENCOUNTER — Inpatient Hospital Stay (HOSPITAL_BASED_OUTPATIENT_CLINIC_OR_DEPARTMENT_OTHER): Admitting: Oncology

## 2022-10-03 VITALS — BP 123/81 | HR 88 | Temp 97.5°F | Resp 18 | Wt 219.0 lb

## 2022-10-03 DIAGNOSIS — C50412 Malignant neoplasm of upper-outer quadrant of left female breast: Secondary | ICD-10-CM | POA: Diagnosis not present

## 2022-10-03 DIAGNOSIS — M8589 Other specified disorders of bone density and structure, multiple sites: Secondary | ICD-10-CM

## 2022-10-03 DIAGNOSIS — Z95828 Presence of other vascular implants and grafts: Secondary | ICD-10-CM

## 2022-10-03 DIAGNOSIS — D696 Thrombocytopenia, unspecified: Secondary | ICD-10-CM

## 2022-10-03 DIAGNOSIS — Z79811 Long term (current) use of aromatase inhibitors: Secondary | ICD-10-CM | POA: Diagnosis not present

## 2022-10-03 DIAGNOSIS — Z171 Estrogen receptor negative status [ER-]: Secondary | ICD-10-CM

## 2022-10-03 DIAGNOSIS — C50812 Malignant neoplasm of overlapping sites of left female breast: Secondary | ICD-10-CM

## 2022-10-03 NOTE — Progress Notes (Signed)
Hematology/Oncology Progress note [phone] [phone]      Patient Care Team: Renee Rival, NP as PCP - General (Nurse Practitioner) Noreene Filbert, MD as Consulting Physician (Radiation Oncology) Earlie Server, MD as Consulting Physician (Oncology) Bary Castilla, Forest Gleason, MD as Consulting Physician (General Surgery)  ASSESSMENT & PLAN:   Cancer Staging  Breast cancer in female [facility]) Staging form: Breast, AJCC 8th Edition - Clinical stage from 04/29/2021: Stage IIB (cT2, cN0, cM0, G3, ER-, PR-, HER2-) - Signed by Earlie Server, MD on 06/04/2021   Breast cancer in female [facility]) # Left upper outer quadrant mass-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  -s/p neoadjuvant pembrolizumab/carboplatin Q 3 weeks / weekly Taxol- followed by ddAC x4. -s/p left upper outer lumpectomy with sentinel lymph node biopsy  ypTis ypN0.   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  Discussed the rationale and side effects of adjuvant zometa. She agrees. Will obtain dental clearance.   Aromatase inhibitor use Continue calcium supplementation.     Osteopenia Continue calcium supplementation.  Zometa Q6 months. Recommend dental clearance  Port-A-Cath in place Recommend patient to continue port flush every 8 weeks  Orders Placed This Encounter  Procedures   CBC with Differential/Platelet    Standing Status:   Future    Standing Expiration Date:   10/04/2023   Comprehensive metabolic panel    Standing Status:   Future    Standing Expiration Date:   10/03/2023   Cancer antigen 15-3    Standing Status:   Future    Standing Expiration Date:   10/04/2023   Cancer antigen 27.29    Standing Status:   Future    Standing Expiration Date:   10/04/2023    Follow-up in 3 months, lab prior to MD.   All questions were answered. The patient knows to call the clinic with any problems, questions or concerns.  Earlie Server, MD, [name] [facility] Hematology Oncology 10/03/2022   CHIEF COMPLAINTS/REASON FOR VISIT:  Follow-up for acute triple negative breast cancer  HISTORY OF PRESENTING ILLNESS:   Peggy Bates is a  56 y.o.  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 Oncology history summary listed as below Oncology History Overview Note  # Pathology dated April 07, 2021 from [facility] in [place]:  Ultrasound-guided core biopsy. Nuclear grade 3, high-grade. P53: 70% staining. Ki-67: 70% staining.  ER negative, PR negative, HER2 negative. Mitotic rate of 13 mitoses per high-power field.  The patient brought a copy of her imaging studies with her from [place] and these have been independently reviewed.  Screening mammogram dated March 30, 2021 showed an asymmetric nodular density in the superior lateral aspect of the left breast. The right breast was unremarkable BI-RADS-0.  Ultrasound examination of the left breast showed a 1.1 cm round, nonparallel hypoechoic mass with angular margins. BI-RADS-4.  Family history of breast cancer and a sister in her late 63s, early 44s. No history of genetic testing available at this time.   Breast cancer in female [facility])  04/21/2021 Initial Diagnosis   Left breast triple negative cancer, residual ER positive DCIS  -03/30/2021, screening mammogram with 3D 2 nodular areas of asymmetric density demonstrated within the superior lateral aspect of the left breast middle third depth.  Finding was best  appreciated on 3D tomosynthesis imaging. Targeted ultrasound showed left upper outer breast 1:30 position 1.1 cm round hypoechoic mass with angular margins. 04/21/2021  biopsy of the breast mass Pathology showed infiltrating ductal carcinoma, grade 3, Ki-67  70%, ER negative, PR negative, HER2 negative,    Case was discussed on Tumor board.  Pathology reports and mammogram/ultrasound were done at outside facility and results were reviewed and discussed with patient and her husband.  LN status was not mentioned on her [place]. Recommend additional images with mammogram and MRI, neoadjuvant chemotherapy    05/12/2021 diagnostic mammogram of left breast showed 2.1 cm biopsy-proven malignancy in the lateral left breast at 2:00, 4 cm from nipple.  Directly adjacent cystic mass, 1.5 cm in size, consistent with biopsy hematoma.  1 abnormal and 1 borderline abnormal left axillary lymph node  05/18/2021 bilateral breast MRI with and without contrast showed 2.5 x 3 x 2.5 cm hematoma containing biopsy clip artifact within the upper outer left breast.  1.6 cm nodular non-mass-like enhancement 2.5 cm posterior/superior to the biopsy clip may represent biopsy-proven malignancy or additional areas of malignancy.  Single abnormal appearing left axillary lymph node with eccentric cortical thickening.  No MRI evidence of the right breast malignancy.   05/20/2021 Slide consultation of her left breast biopsy from outside institution - invasive mammary carcinoma, no special type, grade 3, DCIS and LVI not identified. - ER negative, PR negative, HER2 IHC negative. Ki 67 70-80%     05/20/2021 ultrasound-guided left axillary lymph node was negative for malignancy.  06/02/2021 MRI left breast biopsy of the upper outer quadrant - pathology shows high grade DCIS, ER 30%+    04/29/2021 Cancer Staging   Staging form: Breast, AJCC 8th Edition - Clinical stage from 04/29/2021: Stage IIB (cT2, cN0, cM0, G3, ER-, PR-, HER2-) - Signed by Earlie Server, MD on 06/04/2021 Stage prefix: Initial diagnosis Histologic grading system: 3 grade system   05/07/2021 Procedure   Mediport was placed by Dr. Bary Castilla   05/25/2021 Genetic Testing   Negative genetic testing. No pathogenic variants identified on the  [facility] Multi-Cancer Panel +RNA. The report date is 05/25/2021.  The Multi-Cancer Panel + RNA offered by [facility] includes sequencing and/or deletion duplication testing of the following 84 genes: AIP, ALK, APC, ATM, AXIN2,BAP1,  BARD1, BLM, BMPR1A, BRCA1, BRCA2, BRIP1, CASR, CDC73, CDH1, CDK4, CDKN1B, CDKN1C, CDKN2A (p14ARF), CDKN2A (p16INK4a), CEBPA, CHEK2, CTNNA1, DICER1, DIS3L2, EGFR (c.2369C>T, p.Thr790Met variant only), EPCAM (Deletion/duplication testing only), FH, FLCN, GATA2, GPC3, GREM1 (Promoter region deletion/duplication testing only), HOXB13 (c.251G>A, p.Gly84Glu), HRAS, KIT, MAX, MEN1, MET, MITF (c.952G>A, p.Glu318Lys variant only), MLH1, MSH2, MSH3, MSH6, MUTYH, NBN, NF1, NF2, NTHL1, PALB2, PDGFRA, PHOX2B, PMS2, POLD1, POLE, POT1, PRKAR1A, PTCH1, PTEN, RAD50, RAD51C, RAD51D, RB1, RECQL4, RET, RUNX1, SDHAF2, SDHA (sequence changes only), SDHB, SDHC, SDHD, SMAD4, SMARCA4, SMARCB1, SMARCE1, STK11, SUFU, TERC, TERT, TMEM127, TP53, TSC1, TSC2, VHL, WRN and WT1.   06/08/2021 - 10/28/2021 Chemotherapy   pembrolizumab/carboplatin Q 3 weeks / weekly Taxol followed by ddAC x4 with G-CSF support       06/08/2021 - 10/28/2021 Chemotherapy   Patient is on Treatment Plan : BREAST Pembrolizumab + Carboplatin weekly + Paclitaxel D1,8,15 q21d X 4 cycles /  AC q21d x 4 cycles     08/30/2021 Imaging   MRI breast showed no abnormal enhancement remains in the left breast. Complete response to chemotherapy is identified. No MRI evidence of malignancy in either breast.   11/15/2021 Imaging   bilateral MRI breast showed no abnormal  enhancement is identified in bilateral breast.   12/10/2021 Surgery   patient underwent left upper outer lumpectomy with sentinel lymph node biopsy.  Residual DCIS, high-grade, 4 sentinel lymph nodes were harvested and all negative for malignancy.  Margins are negative for DCIS, closest margin is greater than 5 mm. ypTis ypN0. Residual DCIS ER 11-50%   01/04/2022 - 02/10/2022  Radiation Therapy   adjuvant breast radiation   03/11/2022 -  Anti-estrogen oral therapy   Started on letrozole 2.5 mg daily   05/12/2022 Mammogram   Bilateral diagnostic mammogram/ultrasound showed No mammographic evidence of malignancy   05/12/2022 Imaging   Bone density showed osteopenia FRAX* RESULTS:  10-year Probability of Fracture1 Major Osteoporotic Fracture2 Hip Fracture 2.2% 0.1%      INTERVAL HISTORY Peggy Bates is a 56 y.o. female who has above history reviewed by me today presents for follow up visit for management of left triple negative breast cancer  Patient has been on letrozole 2.5 mg daily.   + arthralgia and stiffness, hot flush.  . Review of Systems  Constitutional:  Positive for fatigue. Negative for appetite change, chills and fever.  HENT:   Negative for hearing loss and voice change.   Eyes:  Negative for eye problems.  Respiratory:  Negative for chest tightness and cough.   Cardiovascular:  Negative for chest pain.  Gastrointestinal:  Negative for abdominal distention, abdominal pain and blood in stool.  Endocrine: Positive for hot flashes.  Genitourinary:  Negative for difficulty urinating and frequency.   Musculoskeletal:  Positive for arthralgias.  Skin:  Negative for itching and rash.  Neurological:  Positive for numbness. Negative for extremity weakness.  Hematological:  Negative for adenopathy.  Psychiatric/Behavioral:  Negative for confusion.     MEDICAL HISTORY:  Past Medical History:  Diagnosis Date   Arthritis    knees   Breast cancer ([place])    2022   COVID-19 12/2020   Depression    Family history of breast cancer    Goiter    Malignant neoplasm of left female breast ([place]) 04/07/2021   Personal history of chemotherapy    Personal history of radiation therapy     SURGICAL HISTORY: Past Surgical History:  Procedure Laterality Date   ADJACENT TISSUE TRANSFER/TISSUE REARRANGEMENT Left 12/10/2021   Procedure: ADJACENT  TISSUE TRANSFER/TISSUE REARRANGEMENT;  Surgeon: Robert Bellow, MD;  Location: [facility] OR[facility];  Service: General;  Laterality: Left;   AXILLARY SENTINEL NODE BIOPSY Left 12/10/2021   Procedure: AXILLARY SENTINEL NODE BIOPSY;  Surgeon: Robert Bellow, MD;  Location: [place] [facility];  Service: General;  Laterality: Left;   BREAST BIOPSY Left 04/07/2021   [facility] in [place]:u/s bx triple neg   BREAST BIOPSY Left 05/20/2021   [place] Axilla Bx, hydro 3 marker, neg   BREAST LUMPECTOMY WITH NEEDLE LOCALIZATION Left 12/10/2021   Procedure: BREAST LUMPECTOMY WITH NEEDLE LOCALIZATION, PECTORAL BLOCK;  Surgeon: Robert Bellow, MD;  Location: [facility] OR[facility];  Service: General;  Laterality: Left;   INCISION AND DRAINAGE ABSCESS Left 09/16/2022   Procedure: INCISION AND DRAINAGE ABSCESS;  Surgeon: Robert Bellow, MD;  Location: [facility] OR[facility];  Service: General;  Laterality: Left;  seroma (not abscess)   KNEE ARTHROSCOPY Right 2008   PORTACATH PLACEMENT Right 05/07/2021   Procedure: INSERTION PORT-A-CATH;  Surgeon: Robert Bellow, MD;  Location: [facility] OR[facility];  Service: General;  Laterality: Right;   TOOTH EXTRACTION     TRANSCERVICAL UTERINE FIBROID(S) ABLATION  2010    SOCIAL HISTORY: Social History  Socioeconomic History   Marital status: Married    Spouse name: Not on file   Number of children: Not on file   Years of education: Not on file   Highest education level: Not on file  Occupational History   Not on file  Tobacco Use   Smoking status: Never   Smokeless tobacco: Never  Vaping Use   Vaping Use: Never used  Substance and Sexual Activity   Alcohol use: Never   Drug use: Never   Sexual activity: Yes  Other Topics Concern   Not on file  Social History Narrative   ** Merged History Encounter **       Social Determinants of Health   Financial Resource Strain: Not on file  Food Insecurity: Not on file  Transportation Needs: Not on file  Physical Activity: Not on file  Stress:  Not on file  Social Connections: Not on file  Intimate Partner Violence: Not on file    FAMILY HISTORY: Family History  Problem Relation Age of Onset   Breast cancer Paternal Grandmother    Breast cancer Sister        dx 42s, recurrence x3   Diabetes Sister    Diabetes Father    Throat cancer Maternal Grandmother     ALLERGIES:  has No Known Allergies.  MEDICATIONS:  Current Outpatient Medications  Medication Sig Dispense Refill   acetaminophen (TYLENOL) 650 MG CR tablet Take 1,300 mg by mouth See admin instructions. Take 1300 mg in the morning may take a second 1300 mg dose during the day as needed for pain     calcium carbonate (OS-CAL - DOSED IN MG OF ELEMENTAL CALCIUM) 1250 (500 Ca) MG tablet Take 1 tablet by mouth.     cholecalciferol (VITAMIN D) 25 MCG (1000 UNIT) tablet Take 1,000 Units by mouth daily.     diclofenac Sodium (VOLTAREN) 1 % GEL Apply 1 application topically daily.     gabapentin (NEURONTIN) 600 MG tablet Take 600 mg by mouth every morning.     HYDROcodone-acetaminophen (NORCO/VICODIN) 5-325 MG tablet Take 1 tablet by mouth every 4 (four) hours as needed for moderate pain. 15 tablet 0   letrozole (FEMARA) 2.5 MG tablet Take 1 tablet (2.5 mg total) by mouth daily. 30 tablet 6   meloxicam (MOBIC) 15 MG tablet Take 15 mg by mouth daily.     Multiple Vitamins-Minerals (ONE-A-DAY ENERGY PO) Take by mouth.     phentermine (ADIPEX-P) 37.5 MG tablet Take 18.75 mg by mouth daily.     No current facility-administered medications for this visit.   Facility-Administered Medications Ordered in Other Visits  Medication Dose Route Frequency Provider Last Rate Last Admin   heparin lock flush 100 unit/mL  500 Units Intravenous Once Earlie Server, MD       heparin lock flush 100 unit/mL  500 Units Intravenous Once Earlie Server, MD       sodium chloride flush (NS) 0.9 % injection 10 mL  10 mL Intravenous Once Earlie Server, MD       sodium chloride flush (NS) 0.9 % injection 10 mL  10 mL  Intravenous PRN Earlie Server, MD         PHYSICAL EXAMINATION: ECOG PERFORMANCE STATUS: 0 - Asymptomatic Vitals:   10/03/22 1145  BP: 123/81  Pulse: 88  Resp: 18  Temp: (!) 97.5 F (36.4 C)  SpO2: 100%   Filed Weights   10/03/22 1145  Weight: 219 lb (99.3 kg)    Physical Exam  Constitutional:      General: She is not in acute distress. HENT:     Head: Normocephalic and atraumatic.  Eyes:     General: No scleral icterus. Cardiovascular:     Rate and Rhythm: Normal rate and regular rhythm.     Heart sounds: Normal heart sounds.  Pulmonary:     Effort: Pulmonary effort is normal. No respiratory distress.     Breath sounds: No wheezing.  Abdominal:     General: Bowel sounds are normal. There is no distension.     Palpations: Abdomen is soft.  Musculoskeletal:        General: No deformity. Normal range of motion.     Cervical back: Normal range of motion and neck supple.  Skin:    General: Skin is warm and dry.     Findings: No erythema or rash.  Neurological:     Mental Status: She is alert and oriented to person, place, and time. Mental status is at baseline.     Cranial Nerves: No cranial nerve deficit.     Coordination: Coordination normal.  Psychiatric:        Mood and Affect: Mood normal.     LABORATORY DATA:  I have reviewed the data as listed    Latest Ref Rng & Units 09/27/2022    8:55 AM 06/13/2022   12:54 PM 03/11/2022    9:51 AM  CBC  WBC 4.0 - 10.5 K/uL 2.2  3.0  2.4   Hemoglobin 12.0 - 15.0 g/dL 13.0  12.5  12.4   Hematocrit 36.0 - 46.0 % 40.1  38.9  38.9   Platelets 150 - 400 K/uL 183  180  176       Latest Ref Rng & Units 09/27/2022    8:55 AM 06/13/2022   12:54 PM 02/02/2022    8:23 AM  CMP  Glucose 70 - 99 mg/dL 96  92  102   BUN 6 - 20 mg/dL '15  18  13   ' Creatinine 0.44 - 1.00 mg/dL 0.65  0.84  0.66   Sodium 135 - 145 mmol/L 136  136  135   Potassium 3.5 - 5.1 mmol/L 4.2  3.9  4.1   Chloride 98 - 111 mmol/L 104  105  104   CO2 22 - 32  mmol/L '27  25  25   ' Calcium 8.9 - 10.3 mg/dL 9.8  9.9  9.6   Total Protein 6.5 - 8.1 g/dL 8.2  8.1  7.8   Total Bilirubin 0.3 - 1.2 mg/dL 0.7  0.7  0.2   Alkaline Phos 38 - 126 U/L 79  72  93   AST 15 - 41 U/L 27  30  36   ALT 0 - 44 U/L 28  26  44     RADIOGRAPHIC STUDIES: I have personally reviewed the radiological images as listed and agreed with the findings in the report. No results found.

## 2022-10-03 NOTE — Assessment & Plan Note (Signed)
Recommend patient to continue port flush every 8 weeks 

## 2022-10-03 NOTE — Progress Notes (Signed)
Patient here for oncology follow-up appointment, of pain & stiffness

## 2022-10-03 NOTE — Assessment & Plan Note (Signed)
Continue calcium supplementation.  Zometa Q6 months. Recommend dental clearance

## 2022-10-03 NOTE — Assessment & Plan Note (Addendum)
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  -s/p neoadjuvant pembrolizumab/carboplatin Q 3 weeks / weekly Taxol- followed by ddAC x4. -s/p left upper outer lumpectomy with sentinel lymph node biopsy  ypTis ypN0.   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  Discussed the rationale and side effects of adjuvant zometa. She agrees. Will obtain dental clearance.

## 2022-10-03 NOTE — Assessment & Plan Note (Deleted)
#  Left breast invasive mammary carcinoma,lateral left,  triple negative,  # Left upper outer quadrant mass-triple negative #left breast additional non mass enhancement 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  -s/p neoadjuvant pembrolizumab/carboplatin Q 3 weeks / weekly Taxol- followed by ddAC x4.   -s/p left upper outer lumpectomy with sentinel lymph node biopsy  ypTis ypN0.  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  Refills sent.

## 2022-10-03 NOTE — Assessment & Plan Note (Signed)
-   Continue calcium supplementation. 

## 2022-10-04 ENCOUNTER — Encounter: Payer: Self-pay | Admitting: Oncology

## 2022-10-04 ENCOUNTER — Telehealth: Payer: Self-pay

## 2022-10-04 NOTE — Addendum Note (Signed)
Addended by: Earlie Server on: 10/04/2022 03:05 PM   Modules accepted: Orders

## 2022-10-04 NOTE — Telephone Encounter (Signed)
Dental clearance received to proceed with bisphosphonate treatment.   Pt has labs on 10/16. Please schedule zometa *new* one day this week or Monday (the latest). Please inform pt of appt.

## 2022-10-06 ENCOUNTER — Inpatient Hospital Stay

## 2022-10-06 VITALS — BP 128/98 | HR 81 | Temp 97.8°F | Resp 16

## 2022-10-06 DIAGNOSIS — Z171 Estrogen receptor negative status [ER-]: Secondary | ICD-10-CM

## 2022-10-06 DIAGNOSIS — C50412 Malignant neoplasm of upper-outer quadrant of left female breast: Secondary | ICD-10-CM | POA: Diagnosis not present

## 2022-10-06 MED ORDER — ZOLEDRONIC ACID 4 MG/100ML IV SOLN
4.0000 mg | Freq: Once | INTRAVENOUS | Status: AC
  Administered 2022-10-06: 4 mg via INTRAVENOUS
  Filled 2022-10-06: qty 100

## 2022-10-06 MED ORDER — SODIUM CHLORIDE 0.9 % IV SOLN
Freq: Once | INTRAVENOUS | Status: AC
  Filled 2022-10-06: qty 250

## 2022-10-06 NOTE — Patient Instructions (Signed)

## 2022-10-07 ENCOUNTER — Telehealth: Payer: Self-pay | Admitting: Oncology

## 2022-10-07 ENCOUNTER — Telehealth: Payer: Self-pay

## 2022-10-07 ENCOUNTER — Other Ambulatory Visit: Payer: Self-pay

## 2022-10-07 ENCOUNTER — Other Ambulatory Visit: Payer: Self-pay | Admitting: Oncology

## 2022-10-07 MED ORDER — TRAMADOL HCL 100 MG PO TABS: 100.0000 mg | ORAL_TABLET | Freq: Four times a day (QID) | ORAL | 0 refills | Status: DC

## 2022-10-07 MED ORDER — PREDNISONE 10 MG (21) PO TBPK: ORAL_TABLET | ORAL | 0 refills | Status: DC

## 2022-10-07 MED ORDER — DIPHENHYDRAMINE HCL 25 MG PO TABS: 25.0000 mg | ORAL_TABLET | Freq: Four times a day (QID) | ORAL | 0 refills | Status: DC | PRN

## 2022-10-07 NOTE — Telephone Encounter (Signed)
PER CHAT: patient is on the line and states after her treatment yesterday, she is broke out in hives, and has had a slight change in breathing. Her work is sending her home. She would like to know what to do.   Can you tell her I will call her back shortly, will check with MD but if she is having trouble breathing and its worsening she should go to ER  she said she is not going to [place]. If something changes she will drive here for the ER. She will be listening for your call.

## 2022-10-07 NOTE — Telephone Encounter (Signed)
Per [place] from [facility] "patient is on the line and states after her treatment yesterday, she is broke out in hives, and has had a slight change in breathing. Her work is sending her home. She would like to know what to do."  Pt had zometa yesterday. I called and spoke to pt and she states that she has hives all over her body, she feels something in he throat but denies worsenin in shortness of breath. She had zometa early in the day and she  noticed hives later that night. This morning she had more around her body. Informed her that Dr. Tasia Catchings has sent in steroids and that she may also take Benadryl. Pt requestesed Benadryl rx to be sent in, so it was sent to pharmacy. Informed her that if symptoms worsen and breathing becomes difficult to go to ER. Pt verbalized understanding, but she says she will try the medicaiton first. Also told her to call our clinic back if she had any other questions.

## 2022-10-07 NOTE — Telephone Encounter (Signed)
Received message from scheduling [place]:  "Patient called--She said the tylenol is not working and she's in serious pain'   Called pt and informed her that Dr. Tasia Catchings will sent tramadol to help for pain. She states that hives are still the same. Advised to call on call or go to ER if she feels worse during the weekend. Also informed her that she can call monday to let us know if she would like be evaluated by our [facility] clinic.

## 2022-10-11 ENCOUNTER — Emergency Department
Admission: EM | Admit: 2022-10-11 | Discharge: 2022-10-11 | Disposition: A | Discharge status: Home / Self Care | Attending: Emergency Medicine | Admitting: Emergency Medicine

## 2022-10-11 ENCOUNTER — Telehealth: Payer: Self-pay | Admitting: *Deleted

## 2022-10-11 ENCOUNTER — Other Ambulatory Visit: Payer: Self-pay

## 2022-10-11 DIAGNOSIS — M791 Myalgia, unspecified site: Secondary | ICD-10-CM | POA: Insufficient documentation

## 2022-10-11 DIAGNOSIS — Z853 Personal history of malignant neoplasm of breast: Secondary | ICD-10-CM | POA: Insufficient documentation

## 2022-10-11 DIAGNOSIS — T7840XA Allergy, unspecified, initial encounter: Secondary | ICD-10-CM | POA: Insufficient documentation

## 2022-10-11 LAB — BASIC METABOLIC PANEL WITH GFR
Anion gap: 6 (ref 5–15)
BUN: 25 mg/dL — ABNORMAL HIGH (ref 6–20)
CO2: 23 mmol/L (ref 22–32)
Calcium: 9.5 mg/dL (ref 8.9–10.3)
Chloride: 108 mmol/L (ref 98–111)
Creatinine, Ser: 0.86 mg/dL (ref 0.44–1.00)
GFR, Estimated: >60 mL/min
Glucose, Bld: 95 mg/dL (ref 70–99)
Potassium: 4.2 mmol/L (ref 3.5–5.1)
Sodium: 137 mmol/L (ref 135–145)

## 2022-10-11 LAB — CBC WITH DIFFERENTIAL/PLATELET
Abs Immature Granulocytes: 0.02 10*3/uL (ref 0.00–0.07)
Basophils Absolute: 0 10*3/uL (ref 0.0–0.1)
Basophils Relative: 0 %
Eosinophils Absolute: 0.1 10*3/uL (ref 0.0–0.5)
Eosinophils Relative: 2 %
HCT: 38.9 % (ref 36.0–46.0)
Hemoglobin: 12.1 g/dL (ref 12.0–15.0)
Immature Granulocytes: 1 %
Lymphocytes Relative: 31 %
Lymphs Abs: 1.2 10*3/uL (ref 0.7–4.0)
MCH: 28.3 pg (ref 26.0–34.0)
MCHC: 31.1 g/dL (ref 30.0–36.0)
MCV: 91.1 fL (ref 80.0–100.0)
Monocytes Absolute: 0.3 10*3/uL (ref 0.1–1.0)
Monocytes Relative: 7 %
Neutro Abs: 2.3 10*3/uL (ref 1.7–7.7)
Neutrophils Relative %: 59 %
Platelets: 179 10*3/uL (ref 150–400)
RBC: 4.27 MIL/uL (ref 3.87–5.11)
RDW: 13.5 % (ref 11.5–15.5)
WBC: 3.8 10*3/uL — ABNORMAL LOW (ref 4.0–10.5)
nRBC: 0 % (ref 0.0–0.2)

## 2022-10-11 LAB — PROTIME-INR
INR: 1 (ref 0.8–1.2)
Prothrombin Time: 12.6 s (ref 11.4–15.2)

## 2022-10-11 LAB — APTT: aPTT: 29 s (ref 24–36)

## 2022-10-11 MED ORDER — FAMOTIDINE 20 MG PO TABS: 20.0000 mg | ORAL_TABLET | Freq: Two times a day (BID) | ORAL | 0 refills | Status: DC

## 2022-10-11 MED ORDER — DEXAMETHASONE SODIUM PHOSPHATE 10 MG/ML IJ SOLN
10.0000 mg | Freq: Once | INTRAMUSCULAR | Status: DC
  Filled 2022-10-11: qty 1

## 2022-10-11 MED ORDER — FAMOTIDINE 20 MG PO TABS
20.0000 mg | ORAL_TABLET | Freq: Once | ORAL | Status: AC
  Administered 2022-10-11: 20 mg via ORAL
  Filled 2022-10-11: qty 1

## 2022-10-11 MED ORDER — DIPHENHYDRAMINE HCL 25 MG PO CAPS
50.0000 mg | ORAL_CAPSULE | Freq: Once | ORAL | Status: AC
  Administered 2022-10-11: 50 mg via ORAL
  Filled 2022-10-11: qty 2

## 2022-10-11 MED ORDER — DEXAMETHASONE SODIUM PHOSPHATE 10 MG/ML IJ SOLN
10.0000 mg | Freq: Once | INTRAMUSCULAR | Status: AC
  Administered 2022-10-11: 10 mg via INTRAVENOUS

## 2022-10-11 NOTE — ED Triage Notes (Signed)
Pt comes with c/o allergic reaction, rash all over. Pt states last Thursday she had zoledronic acid injection at cancer center.   Pt does have rash all over with itching and some burning. Pt speaking in complete sentences. Respirations even and unlabored .

## 2022-10-11 NOTE — Telephone Encounter (Signed)
Patient was advised to go to ER per message from Dr Tasia Catchings patient agreed and said she was going to [facility] ER

## 2022-10-11 NOTE — ED Provider Notes (Signed)
[facility] Provider Note    Event Date/Time   First MD Initiated Contact with Patient 10/11/22 1423     (approximate)   History   Chief Complaint Allergic Reaction   HPI Peggy Bates is a 56 y.o. female, history of osteopenia, depression, obesity, goiter, breast cancer, presents to the emergency department for evaluation of suspected allergic reaction.  Patient states that she had a zoledronic acid injection at the cancer center approximately 5 days ago.  Within 24 hours, she developed a diffuse rash across her body developed body aches as well.  She states that she felt throat swelling on the first day, though this has since subsided.  She was given Benadryl and prednisone, which she took 1 of each yesterday.  She states that overall her rash about aches and not worsen, however they have failed to improve.   Denies fever/chills, chest pain, shortness of breath, abdominal pain, flank pain, nausea/vomiting, diarrhea, weakness, dysuria, headache, numbness or ting in upper or lower extremities, hematuria, vaginal bleeding, or dizziness/lightheadedness.   History Limitations: No limitations.        Physical Exam  Triage Vital Signs: ED Triage Vitals  Enc Vitals Group     BP 10/11/22 1401 124/68     Pulse Rate 10/11/22 1401 88     Resp 10/11/22 1401 18     Temp 10/11/22 1401 98 F (36.7 C)     Temp src --      SpO2 10/11/22 1401 99 %     Weight --      Height --      Head Circumference --      Peak Flow --      Pain Score 10/11/22 1358 2     Pain Loc --      Pain Edu? --      Excl. in [place]? --     Most recent vital signs: Vitals:   10/11/22 1401  BP: 124/68  Pulse: 88  Resp: 18  Temp: 98 F (36.7 C)  SpO2: 99%    General: Awake, NAD.  Eyes: PERRL. Conjunctivae normal.  CV: Good peripheral perfusion.  Resp: Normal effort.  Lung sounds clear bilaterally. Abd: Soft, non-tender. No distention.  Neuro: At baseline. No gross neurological  deficits.  Musculoskeletal: Normal ROM of all extremities.  Focused Exam: No tongue or throat swelling.  Diffuse erythematous, maculopapular rash across the chest, abdomen, and back, extending into the upper extremities above the elbows.  In addition, she has a fine, nonblanching, petechial rash across her lower extremities bilaterally.  See images below.    Media Information      Media Information    Physical Exam    ED Results / Procedures / Treatments  Labs (all labs ordered are listed, but only abnormal results are displayed) Labs Reviewed  CBC WITH DIFFERENTIAL/PLATELET - Abnormal; Notable for the following components:      Result Value   WBC 3.8 (*)    All other components within normal limits  BASIC METABOLIC PANEL - Abnormal; Notable for the following components:   BUN 25 (*)    All other components within normal limits  APTT  PROTIME-INR     EKG N/A.    RADIOLOGY  ED Provider Interpretation: N/A.  No results found.  PROCEDURES:  Critical Care performed: N/A.  Procedures    MEDICATIONS ORDERED IN ED: Medications  diphenhydrAMINE (BENADRYL) capsule 50 mg (50 mg Oral Given 10/11/22 1547)  famotidine (PEPCID) tablet 20  mg (20 mg Oral Given 10/11/22 1547)  dexamethasone (DECADRON) injection 10 mg (10 mg Intravenous Given 10/11/22 1548)     IMPRESSION / MDM / ASSESSMENT AND PLAN / ED COURSE  I reviewed the triage vital signs and the nursing notes.                              Differential diagnosis includes, but is not limited to, allergic reaction, hypocalcemia, ITP, HSP, DIC, TTP, HUS.  ED Course Patient appears well, vitals within normal limits.  NAD.  No signs of anaphylaxis at this time.  CBC shows no leukocytosis or anemia.  No thrombocytopenia.  BMP shows no significant electrolyte abnormalities or AKI.  aPTT and pro time INR unremarkable.  Assessment/Plan Patient presents with maculopapular rash on her trunk, as well as a  petechial rash on her lower extremities bilaterally following a zoledronic acid injection approximately 5 days ago.  She states that some of her symptoms have improved, but the rash persist.  She has taken a single days worth of her prednisone/Benadryl as prescribed, but otherwise has not had any continuous treatments.  Her lab work-up overall has been reassuring.  No leukocytosis, thrombocytopenia, anemia, or abnormal coag studies.  I suspect the petechiae is likely benign.  She was treated here with single dose dexamethasone, Benadryl, and famotidine.  Encouraged her to continue to take her prednisone and Benadryl as originally prescribed.  We will additionally provide her with a prescription for famotidine in hopes of further synergistic effect.  Recommend that she not get any more zoledronic acid injections and to explore other therapies with her doctor.  Recommend that she follow-up with her PCP if her rash fails to improve despite this treatment after 10 days.  She was amenable to this plan.  Will discharge.  Provided the patient with anticipatory guidance, return precautions, and educational material. Encouraged the patient to return to the emergency department at any time if they begin to experience any new or worsening symptoms. Patient expressed understanding and agreed with the plan.   Patient's presentation is most consistent with acute complicated illness / injury requiring diagnostic workup.       FINAL CLINICAL IMPRESSION(S) / ED DIAGNOSES   Final diagnoses:  Allergic reaction, initial encounter     Rx / DC Orders   ED Discharge Orders          Ordered    famotidine (PEPCID) 20 MG tablet  2 times daily        10/11/22 1548             Note:  This document was prepared using Dragon voice recognition software and may include unintentional dictation errors.   Teodoro Spray, [place] 10/11/22 1552    Lavonia Drafts, MD 10/13/22 [phone]

## 2022-10-11 NOTE — Telephone Encounter (Signed)
Patient called reporting that the side effects she has have gotten worse and is now all over her body. She is asking for something more to be done  Note from 10/07/22 "Pt had zometa yesterday. I called and spoke to pt and she states that she has hives all over her body, she feels something in he throat but denies worsenin in shortness of breath. She had zometa early in the day and she  noticed hives later that night. This morning she had more around her body. Informed her that Dr. Tasia Catchings has sent in steroids and that she may also take Benadryl. Pt requestesed Benadryl rx to be sent in, so it was sent to pharmacy. Informed her that if symptoms worsen and breathing becomes difficult to go to ER. Pt verbalized understanding, but she says she will try the medicaiton first. Also told her to call our clinic back if she had any other questions."

## 2022-10-11 NOTE — Discharge Instructions (Addendum)
-  Please continue to take your prednisone as originally prescribed.  -Please take 1 tablet (25 mg) of diphenhydramine every 8 hours for the next 10 days  -Please take the famotidine twice daily for the next 10 days.  -Do not undergo any more zoledronic acid injection treatments.  Speak with your regular doctor about alternative therapies.  -Return to the emergency department anytime if you begin to experience any new or worsening symptoms.

## 2022-11-28 ENCOUNTER — Inpatient Hospital Stay: Attending: Oncology

## 2022-11-28 ENCOUNTER — Inpatient Hospital Stay

## 2022-11-28 DIAGNOSIS — Z95828 Presence of other vascular implants and grafts: Secondary | ICD-10-CM

## 2022-11-28 DIAGNOSIS — M8580 Other specified disorders of bone density and structure, unspecified site: Secondary | ICD-10-CM | POA: Diagnosis present

## 2022-11-28 DIAGNOSIS — Z923 Personal history of irradiation: Secondary | ICD-10-CM | POA: Insufficient documentation

## 2022-11-28 DIAGNOSIS — Z800 Family history of malignant neoplasm of digestive organs: Secondary | ICD-10-CM | POA: Insufficient documentation

## 2022-11-28 DIAGNOSIS — R5383 Other fatigue: Secondary | ICD-10-CM | POA: Insufficient documentation

## 2022-11-28 DIAGNOSIS — T451X5D Adverse effect of antineoplastic and immunosuppressive drugs, subsequent encounter: Secondary | ICD-10-CM | POA: Insufficient documentation

## 2022-11-28 DIAGNOSIS — C50412 Malignant neoplasm of upper-outer quadrant of left female breast: Secondary | ICD-10-CM | POA: Insufficient documentation

## 2022-11-28 DIAGNOSIS — Z79811 Long term (current) use of aromatase inhibitors: Secondary | ICD-10-CM | POA: Diagnosis not present

## 2022-11-28 DIAGNOSIS — Z803 Family history of malignant neoplasm of breast: Secondary | ICD-10-CM | POA: Diagnosis not present

## 2022-11-28 DIAGNOSIS — G620 Drug-induced polyneuropathy: Secondary | ICD-10-CM | POA: Diagnosis not present

## 2022-11-28 DIAGNOSIS — Z452 Encounter for adjustment and management of vascular access device: Secondary | ICD-10-CM | POA: Insufficient documentation

## 2022-11-28 DIAGNOSIS — Z171 Estrogen receptor negative status [ER-]: Secondary | ICD-10-CM | POA: Insufficient documentation

## 2022-11-28 MED ORDER — SODIUM CHLORIDE 0.9% FLUSH
10.0000 mL | Freq: Once | INTRAVENOUS | Status: AC
  Administered 2022-11-28: 10 mL via INTRAVENOUS
  Filled 2022-11-28: qty 10

## 2022-11-28 MED ORDER — HEPARIN SOD (PORK) LOCK FLUSH 100 UNIT/ML IV SOLN
500.0000 [IU] | Freq: Once | INTRAVENOUS | Status: AC
  Administered 2022-11-28: 500 [IU] via INTRAVENOUS
  Filled 2022-11-28: qty 5

## 2023-01-03 ENCOUNTER — Inpatient Hospital Stay (HOSPITAL_BASED_OUTPATIENT_CLINIC_OR_DEPARTMENT_OTHER): Admitting: Oncology

## 2023-01-03 ENCOUNTER — Inpatient Hospital Stay: Attending: Oncology

## 2023-01-03 ENCOUNTER — Inpatient Hospital Stay

## 2023-01-03 ENCOUNTER — Encounter: Payer: Self-pay | Admitting: Oncology

## 2023-01-03 VITALS — BP 120/82 | HR 85 | Temp 97.1°F | Wt 213.9 lb

## 2023-01-03 DIAGNOSIS — R5383 Other fatigue: Secondary | ICD-10-CM | POA: Diagnosis not present

## 2023-01-03 DIAGNOSIS — Z800 Family history of malignant neoplasm of digestive organs: Secondary | ICD-10-CM | POA: Insufficient documentation

## 2023-01-03 DIAGNOSIS — C50412 Malignant neoplasm of upper-outer quadrant of left female breast: Secondary | ICD-10-CM | POA: Insufficient documentation

## 2023-01-03 DIAGNOSIS — Z452 Encounter for adjustment and management of vascular access device: Secondary | ICD-10-CM | POA: Insufficient documentation

## 2023-01-03 DIAGNOSIS — Z79811 Long term (current) use of aromatase inhibitors: Secondary | ICD-10-CM | POA: Insufficient documentation

## 2023-01-03 DIAGNOSIS — G620 Drug-induced polyneuropathy: Secondary | ICD-10-CM | POA: Insufficient documentation

## 2023-01-03 DIAGNOSIS — M8589 Other specified disorders of bone density and structure, multiple sites: Secondary | ICD-10-CM

## 2023-01-03 DIAGNOSIS — C50812 Malignant neoplasm of overlapping sites of left female breast: Secondary | ICD-10-CM | POA: Diagnosis not present

## 2023-01-03 DIAGNOSIS — M8580 Other specified disorders of bone density and structure, unspecified site: Secondary | ICD-10-CM | POA: Insufficient documentation

## 2023-01-03 DIAGNOSIS — Z803 Family history of malignant neoplasm of breast: Secondary | ICD-10-CM | POA: Diagnosis not present

## 2023-01-03 DIAGNOSIS — Z923 Personal history of irradiation: Secondary | ICD-10-CM | POA: Diagnosis not present

## 2023-01-03 DIAGNOSIS — T451X5D Adverse effect of antineoplastic and immunosuppressive drugs, subsequent encounter: Secondary | ICD-10-CM | POA: Insufficient documentation

## 2023-01-03 DIAGNOSIS — Z95828 Presence of other vascular implants and grafts: Secondary | ICD-10-CM

## 2023-01-03 DIAGNOSIS — Z171 Estrogen receptor negative status [ER-]: Secondary | ICD-10-CM

## 2023-01-03 DIAGNOSIS — T451X5A Adverse effect of antineoplastic and immunosuppressive drugs, initial encounter: Secondary | ICD-10-CM

## 2023-01-03 LAB — COMPREHENSIVE METABOLIC PANEL
ALT: 21 U/L (ref 0–44)
AST: 25 U/L (ref 15–41)
Albumin: 4.2 g/dL (ref 3.5–5.0)
Alkaline Phosphatase: 51 U/L (ref 38–126)
Anion gap: 7 (ref 5–15)
BUN: 17 mg/dL (ref 6–20)
CO2: 24 mmol/L (ref 22–32)
Calcium: 9 mg/dL (ref 8.9–10.3)
Chloride: 105 mmol/L (ref 98–111)
Creatinine, Ser: 0.67 mg/dL (ref 0.44–1.00)
GFR, Estimated: >60 mL/min (ref >60)
Glucose, Bld: 129 mg/dL — ABNORMAL HIGH (ref 70–99)
Potassium: 3.6 mmol/L (ref 3.5–5.1)
Sodium: 136 mmol/L (ref 135–145)
Total Bilirubin: 0.4 mg/dL (ref 0.3–1.2)
Total Protein: 7.3 g/dL (ref 6.5–8.1)

## 2023-01-03 LAB — CBC WITH DIFFERENTIAL/PLATELET
Abs Immature Granulocytes: 0.01 10*3/uL (ref 0.00–0.07)
Basophils Absolute: 0 10*3/uL (ref 0.0–0.1)
Basophils Relative: 0 %
Eosinophils Absolute: 0 10*3/uL (ref 0.0–0.5)
Eosinophils Relative: 0 %
HCT: 37.4 % (ref 36.0–46.0)
Hemoglobin: 11.9 g/dL — ABNORMAL LOW (ref 12.0–15.0)
Immature Granulocytes: 0 %
Lymphocytes Relative: 40 %
Lymphs Abs: 1.1 10*3/uL (ref 0.7–4.0)
MCH: 28.6 pg (ref 26.0–34.0)
MCHC: 31.8 g/dL (ref 30.0–36.0)
MCV: 89.9 fL (ref 80.0–100.0)
Monocytes Absolute: 0.2 10*3/uL (ref 0.1–1.0)
Monocytes Relative: 8 %
Neutro Abs: 1.4 10*3/uL — ABNORMAL LOW (ref 1.7–7.7)
Neutrophils Relative %: 52 %
Platelets: 175 10*3/uL (ref 150–400)
RBC: 4.16 MIL/uL (ref 3.87–5.11)
RDW: 13.2 % (ref 11.5–15.5)
WBC: 2.8 10*3/uL — ABNORMAL LOW (ref 4.0–10.5)
nRBC: 0 % (ref 0.0–0.2)

## 2023-01-03 MED ORDER — DENOSUMAB 60 MG/ML ~~LOC~~ SOSY
60.0000 mg | PREFILLED_SYRINGE | Freq: Once | SUBCUTANEOUS | Status: AC
  Administered 2023-01-03: 60 mg via SUBCUTANEOUS
  Filled 2023-01-03: qty 1

## 2023-01-03 MED ORDER — SODIUM CHLORIDE 0.9% FLUSH
10.0000 mL | INTRAVENOUS | Status: DC | PRN
  Administered 2023-01-03: 10 mL via INTRAVENOUS
  Filled 2023-01-03: qty 10

## 2023-01-03 MED ORDER — LETROZOLE 2.5 MG PO TABS: 2.5000 mg | ORAL_TABLET | Freq: Every day | ORAL | 6 refills | Status: DC

## 2023-01-03 MED ORDER — HEPARIN SOD (PORK) LOCK FLUSH 100 UNIT/ML IV SOLN
INTRAVENOUS | Status: AC
  Administered 2023-01-03: 500 [IU] via INTRAVENOUS
  Filled 2023-01-03: qty 5

## 2023-01-03 MED ORDER — HEPARIN SOD (PORK) LOCK FLUSH 100 UNIT/ML IV SOLN
500.0000 [IU] | Freq: Once | INTRAVENOUS | Status: AC
  Filled 2023-01-03: qty 5

## 2023-01-03 NOTE — Assessment & Plan Note (Signed)
Recommend patient to continue port flush every 8 weeks

## 2023-01-03 NOTE — Assessment & Plan Note (Signed)
Patient gabapentin 600 mg twice daily. I discussed about acupuncture clinic referral.  Patient prefers to defer at this point.

## 2023-01-03 NOTE — Assessment & Plan Note (Signed)
- 

## 2023-01-03 NOTE — Assessment & Plan Note (Addendum)
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  -s/p neoadjuvant pembrolizumab/carboplatin Q 3 weeks / weekly Taxol- followed by ddAC x4. -s/p left upper outer lumpectomy with sentinel lymph node biopsy  ypTis ypN0.   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  Hives/allergic reaction after  zometa. Switch prolia '60mg'$  Q6 months. First dose today, next due July 2024 Recommend calcium supplementation

## 2023-01-03 NOTE — Progress Notes (Signed)
Hematology/Oncology Progress note [phone] [phone]      Patient Care Team: Renee Rival, NP as PCP - General (Nurse Practitioner) Noreene Filbert, MD as Consulting Physician (Radiation Oncology) Earlie Server, MD as Consulting Physician (Oncology) Bary Castilla, Forest Gleason, MD as Consulting Physician (General Surgery)  ASSESSMENT & PLAN:   Cancer Staging  Breast cancer in female [facility]) Staging form: Breast, AJCC 8th Edition - Clinical stage from 04/29/2021: Stage IIB (cT2, cN0, cM0, G3, ER-, PR-, HER2-) - Signed by Earlie Server, MD on 06/04/2021   Breast cancer in female [facility]) # Left upper outer quadrant mass-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  -s/p neoadjuvant pembrolizumab/carboplatin Q 3 weeks / weekly Taxol- followed by ddAC x4. -s/p left upper outer lumpectomy with sentinel lymph node biopsy  ypTis ypN0.   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  Hives/allergic reaction after  zometa. Switch prolia '60mg'$  Q6 months. First dose today, next due July 2024 Recommend calcium supplementation   Aromatase inhibitor use Continue calcium supplementation.     Chemotherapy-induced neuropathy (HCC) Patient gabapentin 600 mg twice daily. I discussed about acupuncture clinic referral.  Patient prefers to defer at this point.  Osteopenia Continue calcium supplementation.  Prolia Q6 months.  Port-A-Cath in place Recommend patient to continue port flush every 8 weeks  Orders Placed This Encounter  Procedures   CBC with Differential/Platelet    Standing Status:   Future    Standing Expiration Date:   01/03/2024   Comprehensive metabolic panel    Standing Status:   Future    Standing Expiration Date:   01/03/2024   Cancer  antigen 15-3    Standing Status:   Future    Standing Expiration Date:   01/04/2024   Cancer antigen 27.29    Standing Status:   Future    Standing Expiration Date:   01/04/2024   Follow-up in 3 months, lab prior to MD.   All questions were answered. The patient knows to call the clinic with any problems, questions or concerns.  Earlie Server, MD, [name] [facility] Hematology Oncology 01/03/2023   CHIEF COMPLAINTS/REASON FOR VISIT:  Follow-up for acute triple negative breast cancer  HISTORY OF PRESENTING ILLNESS:   Peggy Bates is a  57 y.o.  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 Oncology history summary listed as below Oncology History Overview Note  # Pathology dated April 07, 2021 from [facility] in [place]:  Ultrasound-guided core biopsy. Nuclear grade 3, high-grade. P53: 70% staining. Ki-67: 70% staining.  ER negative, PR negative, HER2 negative. Mitotic rate of 13 mitoses per high-power field.  The patient brought a copy of her imaging studies with her from [place] and these have been independently reviewed.  Screening mammogram dated March 30, 2021 showed an asymmetric nodular density in the superior lateral aspect of the left breast. The right breast was unremarkable BI-RADS-0.  Ultrasound examination of the left breast showed a 1.1 cm round, nonparallel hypoechoic mass with angular margins. BI-RADS-4.  Family history of breast cancer and a sister in her late 16s, early 64s. No history of genetic testing available at this time.   Breast cancer in female [facility])  04/21/2021 Initial Diagnosis   Left breast triple negative cancer, residual ER positive DCIS  -03/30/2021, screening  mammogram with 3D 2 nodular areas of asymmetric density demonstrated within the superior lateral aspect of the left breast middle third depth.  Finding was best appreciated on 3D tomosynthesis imaging. Targeted ultrasound showed left upper  outer breast 1:30 position 1.1 cm round hypoechoic mass with angular margins. 04/21/2021  biopsy of the breast mass Pathology showed infiltrating ductal carcinoma, grade 3, Ki-67 70%, ER negative, PR negative, HER2 negative,    Case was discussed on Tumor board.  Pathology reports and mammogram/ultrasound were done at outside facility and results were reviewed and discussed with patient and her husband.  LN status was not mentioned on her [place]. Recommend additional images with mammogram and MRI, neoadjuvant chemotherapy    05/12/2021 diagnostic mammogram of left breast showed 2.1 cm biopsy-proven malignancy in the lateral left breast at 2:00, 4 cm from nipple.  Directly adjacent cystic mass, 1.5 cm in size, consistent with biopsy hematoma.  1 abnormal and 1 borderline abnormal left axillary lymph node  05/18/2021 bilateral breast MRI with and without contrast showed 2.5 x 3 x 2.5 cm hematoma containing biopsy clip artifact within the upper outer left breast.  1.6 cm nodular non-mass-like enhancement 2.5 cm posterior/superior to the biopsy clip may represent biopsy-proven malignancy or additional areas of malignancy.  Single abnormal appearing left axillary lymph node with eccentric cortical thickening.  No MRI evidence of the right breast malignancy.   05/20/2021 Slide consultation of her left breast biopsy from outside institution - invasive mammary carcinoma, no special type, grade 3, DCIS and LVI not identified. - ER negative, PR negative, HER2 IHC negative. Ki 67 70-80%     05/20/2021 ultrasound-guided left axillary lymph node was negative for malignancy.  06/02/2021 MRI left breast biopsy of the upper outer quadrant - pathology shows high grade DCIS, ER 30%+    04/29/2021 Cancer Staging   Staging form: Breast, AJCC 8th Edition - Clinical stage from 04/29/2021: Stage IIB (cT2, cN0, cM0, G3, ER-, PR-, HER2-) - Signed by Earlie Server, MD on 06/04/2021 Stage prefix: Initial diagnosis Histologic grading system: 3  grade system   05/07/2021 Procedure   Mediport was placed by Dr. Bary Castilla   05/25/2021 Genetic Testing   Negative genetic testing. No pathogenic variants identified on the [facility] Multi-Cancer Panel +RNA. The report date is 05/25/2021.  The Multi-Cancer Panel + RNA offered by [facility] includes sequencing and/or deletion duplication testing of the following 84 genes: AIP, ALK, APC, ATM, AXIN2,BAP1,  BARD1, BLM, BMPR1A, BRCA1, BRCA2, BRIP1, CASR, CDC73, CDH1, CDK4, CDKN1B, CDKN1C, CDKN2A (p14ARF), CDKN2A (p16INK4a), CEBPA, CHEK2, CTNNA1, DICER1, DIS3L2, EGFR (c.2369C>T, p.Thr790Met variant only), EPCAM (Deletion/duplication testing only), FH, FLCN, GATA2, GPC3, GREM1 (Promoter region deletion/duplication testing only), HOXB13 (c.251G>A, p.Gly84Glu), HRAS, KIT, MAX, MEN1, MET, MITF (c.952G>A, p.Glu318Lys variant only), MLH1, MSH2, MSH3, MSH6, MUTYH, NBN, NF1, NF2, NTHL1, PALB2, PDGFRA, PHOX2B, PMS2, POLD1, POLE, POT1, PRKAR1A, PTCH1, PTEN, RAD50, RAD51C, RAD51D, RB1, RECQL4, RET, RUNX1, SDHAF2, SDHA (sequence changes only), SDHB, SDHC, SDHD, SMAD4, SMARCA4, SMARCB1, SMARCE1, STK11, SUFU, TERC, TERT, TMEM127, TP53, TSC1, TSC2, VHL, WRN and WT1.   06/08/2021 - 10/28/2021 Chemotherapy   pembrolizumab/carboplatin Q 3 weeks / weekly Taxol followed by ddAC x4 with G-CSF support       06/08/2021 - 10/28/2021 Chemotherapy   Patient is on Treatment Plan : BREAST Pembrolizumab + Carboplatin weekly + Paclitaxel D1,8,15 q21d X 4 cycles /  AC q21d x 4 cycles     08/30/2021 Imaging   MRI breast showed no abnormal enhancement remains in the left breast.  Complete response to chemotherapy is identified. No MRI evidence of malignancy in either breast.   11/15/2021 Imaging   bilateral MRI breast showed no abnormal enhancement is identified in bilateral breast.   12/10/2021 Surgery   patient underwent left upper outer lumpectomy with sentinel lymph node biopsy.  Residual DCIS, high-grade, 4 sentinel lymph nodes were  harvested and all negative for malignancy.  Margins are negative for DCIS, closest margin is greater than 5 mm. ypTis ypN0. Residual DCIS ER 11-50%   01/04/2022 - 02/10/2022 Radiation Therapy   adjuvant breast radiation   03/11/2022 -  Anti-estrogen oral therapy   Started on letrozole 2.5 mg daily   05/12/2022 Mammogram   Bilateral diagnostic mammogram/ultrasound showed No mammographic evidence of malignancy   05/12/2022 Imaging   Bone density showed osteopenia FRAX* RESULTS:  10-year Probability of Fracture1 Major Osteoporotic Fracture2 Hip Fracture 2.2% 0.1%      INTERVAL HISTORY Peggy Bates is a 57 y.o. female who has above history reviewed by me today presents for follow up visit for management of left triple negative breast cancer  Patient has been on letrozole 2.5 mg daily.   + arthralgia and stiffness, hot flush. She developed allergic reaction including diffuse rash/hives and throat swelling after Zometa treatments.  Zometa was discontinued.  Today patient reports feeling well.  She has no new complaints.  She follows up with surgery Dr. Peyton Najjar.  Plan to have bilateral diagnostic mammogram in May 2024, she will get imaging orders through surgery's office.  . Review of Systems  Constitutional:  Positive for fatigue. Negative for appetite change, chills and fever.  HENT:   Negative for hearing loss and voice change.   Eyes:  Negative for eye problems.  Respiratory:  Negative for chest tightness and cough.   Cardiovascular:  Negative for chest pain.  Gastrointestinal:  Negative for abdominal distention, abdominal pain and blood in stool.  Endocrine: Positive for hot flashes.  Genitourinary:  Negative for difficulty urinating and frequency.   Musculoskeletal:  Positive for arthralgias.  Skin:  Negative for itching and rash.  Neurological:  Positive for numbness. Negative for extremity weakness.  Hematological:  Negative for adenopathy.  Psychiatric/Behavioral:   Negative for confusion.     MEDICAL HISTORY:  Past Medical History:  Diagnosis Date   Arthritis    knees   Breast cancer ([place])    2022   COVID-19 12/2020   Depression    Family history of breast cancer    Goiter    Malignant neoplasm of left female breast ([place]) 04/07/2021   Personal history of chemotherapy    Personal history of radiation therapy     SURGICAL HISTORY: Past Surgical History:  Procedure Laterality Date   ADJACENT TISSUE TRANSFER/TISSUE REARRANGEMENT Left 12/10/2021   Procedure: ADJACENT TISSUE TRANSFER/TISSUE REARRANGEMENT;  Surgeon: Robert Bellow, MD;  Location: [facility] ORS;  Service: General;  Laterality: Left;   AXILLARY SENTINEL NODE BIOPSY Left 12/10/2021   Procedure: AXILLARY SENTINEL NODE BIOPSY;  Surgeon: Robert Bellow, MD;  Location: [facility] ORS;  Service: General;  Laterality: Left;   BREAST BIOPSY Left 04/07/2021   [place]:u/s bx triple neg   BREAST BIOPSY Left 05/20/2021   [place] Axilla Bx, hydro 3 marker, neg   BREAST LUMPECTOMY WITH NEEDLE LOCALIZATION Left 12/10/2021   Procedure: BREAST LUMPECTOMY WITH NEEDLE LOCALIZATION, PECTORAL BLOCK;  Surgeon: Robert Bellow, MD;  Location: [facility] ORS;  Service: General;  Laterality: Left;   INCISION AND DRAINAGE ABSCESS Left 09/16/2022  Procedure: INCISION AND DRAINAGE ABSCESS;  Surgeon: Robert Bellow, MD;  Location: [facility] OR[facility];  Service: General;  Laterality: Left;  seroma (not abscess)   KNEE ARTHROSCOPY Right 2008   PORTACATH PLACEMENT Right 05/07/2021   Procedure: INSERTION PORT-A-CATH;  Surgeon: Robert Bellow, MD;  Location: [facility] ORS;  Service: General;  Laterality: Right;   TOOTH EXTRACTION     TRANSCERVICAL UTERINE FIBROID(S) ABLATION  2010    SOCIAL HISTORY: Social History   Socioeconomic History   Marital status: Married    Spouse name: Not on file   Number of children: Not on file   Years of education: Not on file   Highest education level: Not on file   Occupational History   Not on file  Tobacco Use   Smoking status: Never   Smokeless tobacco: Never  Vaping Use   Vaping Use: Never used  Substance and Sexual Activity   Alcohol use: Never   Drug use: Never   Sexual activity: Yes  Other Topics Concern   Not on file  Social History Narrative   ** Merged History Encounter **       Social Determinants of Health   Financial Resource Strain: Not on file  Food Insecurity: Not on file  Transportation Needs: Not on file  Physical Activity: Not on file  Stress: Not on file  Social Connections: Not on file  Intimate Partner Violence: Not on file    FAMILY HISTORY: Family History  Problem Relation Age of Onset   Breast cancer Paternal Grandmother    Breast cancer Sister        dx 30s, recurrence x3   Diabetes Sister    Diabetes Father    Throat cancer Maternal Grandmother     ALLERGIES:  is allergic to other.  MEDICATIONS:  Current Outpatient Medications  Medication Sig Dispense Refill   acetaminophen (TYLENOL) 650 MG CR tablet Take 1,300 mg by mouth See admin instructions. Take 1300 mg in the morning may take a second 1300 mg dose during the day as needed for pain     calcium carbonate (OS-CAL - DOSED IN MG OF ELEMENTAL CALCIUM) 1250 (500 Ca) MG tablet Take 1 tablet by mouth.     cholecalciferol (VITAMIN D) 25 MCG (1000 UNIT) tablet Take 1,000 Units by mouth daily.     diclofenac Sodium (VOLTAREN) 1 % GEL Apply 1 application topically daily.     diphenhydrAMINE (BENADRYL ALLERGY) 25 MG tablet Take 1 tablet (25 mg total) by mouth every 6 (six) hours as needed. 30 tablet 0   gabapentin (NEURONTIN) 600 MG tablet Take 600 mg by mouth every morning.     meloxicam (MOBIC) 15 MG tablet Take 15 mg by mouth daily.     Multiple Vitamins-Minerals (ONE-A-DAY ENERGY PO) Take by mouth.     phentermine (ADIPEX-P) 37.5 MG tablet Take 18.75 mg by mouth daily.     famotidine (PEPCID) 20 MG tablet Take 1 tablet (20 mg total) by mouth 2 (two)  times daily for 10 days. (Patient not taking: Reported on 01/03/2023) 20 tablet 0   letrozole (FEMARA) 2.5 MG tablet Take 1 tablet (2.5 mg total) by mouth daily. 30 tablet 6   No current facility-administered medications for this visit.   Facility-Administered Medications Ordered in Other Visits  Medication Dose Route Frequency Provider Last Rate Last Admin   heparin lock flush 100 unit/mL  500 Units Intravenous Once Earlie Server, MD       heparin lock flush 100 unit/mL  500 Units Intravenous Once Earlie Server, MD       sodium chloride flush (NS) 0.9 % injection 10 mL  10 mL Intravenous Once Earlie Server, MD       sodium chloride flush (NS) 0.9 % injection 10 mL  10 mL Intravenous PRN Earlie Server, MD         PHYSICAL EXAMINATION: ECOG PERFORMANCE STATUS: 0 - Asymptomatic Vitals:   01/03/23 1016  BP: 120/82  Pulse: 85  Temp: (!) 97.1 F (36.2 C)  SpO2: 100%   Filed Weights   01/03/23 1016  Weight: 213 lb 14.4 oz (97 kg)    Physical Exam Constitutional:      General: She is not in acute distress. HENT:     Head: Normocephalic and atraumatic.  Eyes:     General: No scleral icterus. Cardiovascular:     Rate and Rhythm: Normal rate and regular rhythm.     Heart sounds: Normal heart sounds.  Pulmonary:     Effort: Pulmonary effort is normal. No respiratory distress.     Breath sounds: No wheezing.  Abdominal:     General: Bowel sounds are normal. There is no distension.     Palpations: Abdomen is soft.  Musculoskeletal:        General: No deformity. Normal range of motion.     Cervical back: Normal range of motion and neck supple.  Skin:    General: Skin is warm and dry.     Findings: No erythema or rash.  Neurological:     Mental Status: She is alert and oriented to person, place, and time. Mental status is at baseline.     Cranial Nerves: No cranial nerve deficit.     Coordination: Coordination normal.  Psychiatric:        Mood and Affect: Mood normal.     LABORATORY DATA:  I  have reviewed the data as listed    Latest Ref Rng & Units 01/03/2023   10:00 AM 10/11/2022    2:44 PM 09/27/2022    8:55 AM  CBC  WBC 4.0 - 10.5 K/uL 2.8  3.8  2.2   Hemoglobin 12.0 - 15.0 g/dL 11.9  12.1  13.0   Hematocrit 36.0 - 46.0 % 37.4  38.9  40.1   Platelets 150 - 400 K/uL 175  179  183       Latest Ref Rng & Units 01/03/2023   10:00 AM 10/11/2022    2:44 PM 09/27/2022    8:55 AM  CMP  Glucose 70 - 99 mg/dL 129  95  96   BUN 6 - 20 mg/dL '17  25  15   '$ Creatinine 0.44 - 1.00 mg/dL 0.67  0.86  0.65   Sodium 135 - 145 mmol/L 136  137  136   Potassium 3.5 - 5.1 mmol/L 3.6  4.2  4.2   Chloride 98 - 111 mmol/L 105  108  104   CO2 22 - 32 mmol/L '24  23  27   '$ Calcium 8.9 - 10.3 mg/dL 9.0  9.5  9.8   Total Protein 6.5 - 8.1 g/dL 7.3   8.2   Total Bilirubin 0.3 - 1.2 mg/dL 0.4   0.7   Alkaline Phos 38 - 126 U/L 51   79   AST 15 - 41 U/L 25   27   ALT 0 - 44 U/L 21   28     RADIOGRAPHIC STUDIES: I have personally reviewed the radiological images as listed  and agreed with the findings in the report. No results found.

## 2023-01-03 NOTE — Assessment & Plan Note (Signed)
Continue calcium supplementation.  Prolia Q6 months.

## 2023-01-04 LAB — CANCER ANTIGEN 15-3: CA 15-3: 4.6 U/mL (ref 0.0–25.0)

## 2023-01-04 LAB — CANCER ANTIGEN 27.29: CA 27.29: <9 U/mL (ref 0.0–38.6)

## 2023-01-19 ENCOUNTER — Telehealth: Payer: Self-pay | Admitting: Oncology

## 2023-01-19 NOTE — Telephone Encounter (Signed)
Patient called requesting to cancel port flush for 2/5. She had her last port flush on 1/16. Patient asked if she needs to have a injection scheduled prior to next appointment with md. Please advise.

## 2023-01-19 NOTE — Telephone Encounter (Signed)
Since last port flush was on 1/16, please adjust port flushes to be every 8 weeks x6 after that date. Ok to cancel port flush for 2/5. Prolia injection is every 6 months so she will not need one until July 2024. We will schedule next injection at appt in April.   Please inform pt of updated port flushes.   Thank you

## 2023-01-23 ENCOUNTER — Inpatient Hospital Stay

## 2023-02-28 ENCOUNTER — Inpatient Hospital Stay: Attending: Oncology

## 2023-02-28 DIAGNOSIS — C50412 Malignant neoplasm of upper-outer quadrant of left female breast: Secondary | ICD-10-CM | POA: Insufficient documentation

## 2023-02-28 DIAGNOSIS — Z95828 Presence of other vascular implants and grafts: Secondary | ICD-10-CM

## 2023-02-28 DIAGNOSIS — Z171 Estrogen receptor negative status [ER-]: Secondary | ICD-10-CM | POA: Insufficient documentation

## 2023-02-28 DIAGNOSIS — M8580 Other specified disorders of bone density and structure, unspecified site: Secondary | ICD-10-CM | POA: Diagnosis not present

## 2023-02-28 DIAGNOSIS — Z452 Encounter for adjustment and management of vascular access device: Secondary | ICD-10-CM | POA: Diagnosis present

## 2023-02-28 MED ORDER — SODIUM CHLORIDE 0.9% FLUSH
10.0000 mL | Freq: Once | INTRAVENOUS | Status: AC
  Administered 2023-02-28: 10 mL via INTRAVENOUS
  Filled 2023-02-28: qty 10

## 2023-02-28 MED ORDER — HEPARIN SOD (PORK) LOCK FLUSH 100 UNIT/ML IV SOLN
500.0000 [IU] | Freq: Once | INTRAVENOUS | Status: AC
  Administered 2023-02-28: 500 [IU] via INTRAVENOUS
  Filled 2023-02-28: qty 5

## 2023-02-28 NOTE — Patient Instructions (Signed)

## 2023-03-16 ENCOUNTER — Other Ambulatory Visit: Payer: Self-pay | Admitting: Surgery

## 2023-03-16 DIAGNOSIS — Z853 Personal history of malignant neoplasm of breast: Secondary | ICD-10-CM

## 2023-03-20 ENCOUNTER — Inpatient Hospital Stay

## 2023-03-30 ENCOUNTER — Ambulatory Visit
Admission: RE | Admit: 2023-03-30 | Discharge: 2023-03-30 | Disposition: A | Discharge status: Home / Self Care | Source: Ambulatory Visit | Attending: Radiation Oncology | Admitting: Radiation Oncology

## 2023-03-30 VITALS — BP 112/66 | HR 75 | Temp 97.5°F | Resp 16 | Ht 66.0 in | Wt 208.4 lb

## 2023-03-30 DIAGNOSIS — Z79811 Long term (current) use of aromatase inhibitors: Secondary | ICD-10-CM | POA: Insufficient documentation

## 2023-03-30 DIAGNOSIS — Z923 Personal history of irradiation: Secondary | ICD-10-CM | POA: Diagnosis not present

## 2023-03-30 DIAGNOSIS — Z170 Estrogen receptor positive status [ER+]: Secondary | ICD-10-CM | POA: Diagnosis not present

## 2023-03-30 DIAGNOSIS — C50412 Malignant neoplasm of upper-outer quadrant of left female breast: Secondary | ICD-10-CM | POA: Insufficient documentation

## 2023-03-30 NOTE — Progress Notes (Signed)
Radiation Oncology Follow up Note  Name: Peggy Bates   Date:   03/30/2023 MRN:  [id number] DOB: 11-19-1966    This 57 y.o. female presents to the clinic today for 1 year follow up status post whole breast radiation to her left breast for triple negative invasive mammary carcinoma status post neoadjuvant chemotherapy.  REFERRING PROVIDER: [name], NP  HPI: Patient is a 57 year old female now out 1 year having completed whole breast radiation to her left breast for triple negative stage IIa invasive mammary carcinoma.  Seen today in routine follow-up she is doing well.  She specifically denies breast tenderness cough or bone pain.  She had mammogram and ultrasound back in May which I reviewed BI-RADS 2 benign.  She is currently on Femara tolerating it well without side effect  COMPLICATIONS OF TREATMENT: none  FOLLOW UP COMPLIANCE: keeps appointments   PHYSICAL EXAM:  BP 112/66   Pulse 75   Temp (!) 97.5 F (36.4 C)   Resp 16   Ht 5\' 6"  (1.676 m)   Wt 208 lb 6.4 oz (94.5 kg)   LMP  (LMP Unknown)   BMI 33.64 kg/m  Lungs are clear to A&P cardiac examination essentially unremarkable with regular rate and rhythm. No dominant mass or nodularity is noted in either breast in 2 positions examined. Incision is well-healed. No axillary or supraclavicular adenopathy is appreciated. Cosmetic result is excellent.  Well-developed well-nourished patient in NAD. HEENT reveals PERLA, EOMI, discs not visualized.  Oral cavity is clear. No oral mucosal lesions are identified. Neck is clear without evidence of cervical or supraclavicular adenopathy. Lungs are clear to A&P. Cardiac examination is essentially unremarkable with regular rate and rhythm without murmur rub or thrill. Abdomen is benign with no organomegaly or masses noted. Motor sensory and DTR levels are equal and symmetric in the upper and lower extremities. Cranial nerves II through XII are grossly intact. Proprioception is intact.  No peripheral adenopathy or edema is identified. No motor or sensory levels are noted. Crude visual fields are within normal range.  RADIOLOGY RESULTS: Mammogram and ultrasound reviewed compatible with above-stated findings  PLAN: The present time patient is doing well with no evidence of disease now out 1 year.  And pleased with her overall progress.  I have asked to see her back in 1 year for follow-up.  She continues on Femara without side effect.  Patient is to call with any concerns.  I would like to take this opportunity to thank you for allowing me to participate in the care of your patient.Carmina Miller, MD

## 2023-03-31 ENCOUNTER — Other Ambulatory Visit: Payer: Self-pay | Admitting: *Deleted

## 2023-03-31 MED ORDER — LIDOCAINE-PRILOCAINE 2.5-2.5 % EX CREA: 1.0000 | TOPICAL_CREAM | CUTANEOUS | 2 refills | Status: AC | PRN

## 2023-04-05 ENCOUNTER — Encounter: Payer: Self-pay | Admitting: Oncology

## 2023-04-05 ENCOUNTER — Inpatient Hospital Stay (HOSPITAL_BASED_OUTPATIENT_CLINIC_OR_DEPARTMENT_OTHER): Admitting: Oncology

## 2023-04-05 ENCOUNTER — Inpatient Hospital Stay: Attending: Oncology

## 2023-04-05 VITALS — BP 130/92 | HR 79 | Temp 97.0°F | Resp 18 | Wt 207.0 lb

## 2023-04-05 DIAGNOSIS — C50412 Malignant neoplasm of upper-outer quadrant of left female breast: Secondary | ICD-10-CM | POA: Diagnosis not present

## 2023-04-05 DIAGNOSIS — G620 Drug-induced polyneuropathy: Secondary | ICD-10-CM | POA: Insufficient documentation

## 2023-04-05 DIAGNOSIS — Z79811 Long term (current) use of aromatase inhibitors: Secondary | ICD-10-CM | POA: Insufficient documentation

## 2023-04-05 DIAGNOSIS — Z171 Estrogen receptor negative status [ER-]: Secondary | ICD-10-CM | POA: Diagnosis not present

## 2023-04-05 DIAGNOSIS — M8589 Other specified disorders of bone density and structure, multiple sites: Secondary | ICD-10-CM

## 2023-04-05 DIAGNOSIS — Z923 Personal history of irradiation: Secondary | ICD-10-CM | POA: Diagnosis not present

## 2023-04-05 DIAGNOSIS — Z9221 Personal history of antineoplastic chemotherapy: Secondary | ICD-10-CM | POA: Diagnosis not present

## 2023-04-05 DIAGNOSIS — Z95828 Presence of other vascular implants and grafts: Secondary | ICD-10-CM

## 2023-04-05 DIAGNOSIS — Z79899 Other long term (current) drug therapy: Secondary | ICD-10-CM | POA: Insufficient documentation

## 2023-04-05 DIAGNOSIS — T451X5D Adverse effect of antineoplastic and immunosuppressive drugs, subsequent encounter: Secondary | ICD-10-CM

## 2023-04-05 DIAGNOSIS — Z803 Family history of malignant neoplasm of breast: Secondary | ICD-10-CM | POA: Insufficient documentation

## 2023-04-05 DIAGNOSIS — R232 Flushing: Secondary | ICD-10-CM | POA: Insufficient documentation

## 2023-04-05 DIAGNOSIS — R5383 Other fatigue: Secondary | ICD-10-CM | POA: Diagnosis not present

## 2023-04-05 DIAGNOSIS — M8580 Other specified disorders of bone density and structure, unspecified site: Secondary | ICD-10-CM

## 2023-04-05 LAB — COMPREHENSIVE METABOLIC PANEL
ALT: 28 U/L (ref 0–44)
AST: 29 U/L (ref 15–41)
Albumin: 4.6 g/dL (ref 3.5–5.0)
Alkaline Phosphatase: 42 U/L (ref 38–126)
Anion gap: 6 (ref 5–15)
BUN: 20 mg/dL (ref 6–20)
CO2: 26 mmol/L (ref 22–32)
Calcium: 9.8 mg/dL (ref 8.9–10.3)
Chloride: 106 mmol/L (ref 98–111)
Creatinine, Ser: 0.72 mg/dL (ref 0.44–1.00)
GFR, Estimated: >60 mL/min (ref >60)
Glucose, Bld: 96 mg/dL (ref 70–99)
Potassium: 4 mmol/L (ref 3.5–5.1)
Sodium: 138 mmol/L (ref 135–145)
Total Bilirubin: 0.6 mg/dL (ref 0.3–1.2)
Total Protein: 7.8 g/dL (ref 6.5–8.1)

## 2023-04-05 LAB — CBC WITH DIFFERENTIAL/PLATELET
Abs Immature Granulocytes: 0.01 10*3/uL (ref 0.00–0.07)
Basophils Absolute: 0 10*3/uL (ref 0.0–0.1)
Basophils Relative: 0 %
Eosinophils Absolute: 0 10*3/uL (ref 0.0–0.5)
Eosinophils Relative: 0 %
HCT: 37.1 % (ref 36.0–46.0)
Hemoglobin: 11.7 g/dL — ABNORMAL LOW (ref 12.0–15.0)
Immature Granulocytes: 0 %
Lymphocytes Relative: 37 %
Lymphs Abs: 1.1 10*3/uL (ref 0.7–4.0)
MCH: 28.6 pg (ref 26.0–34.0)
MCHC: 31.5 g/dL (ref 30.0–36.0)
MCV: 90.7 fL (ref 80.0–100.0)
Monocytes Absolute: 0.3 10*3/uL (ref 0.1–1.0)
Monocytes Relative: 9 %
Neutro Abs: 1.6 10*3/uL — ABNORMAL LOW (ref 1.7–7.7)
Neutrophils Relative %: 54 %
Platelets: 172 10*3/uL (ref 150–400)
RBC: 4.09 MIL/uL (ref 3.87–5.11)
RDW: 13.1 % (ref 11.5–15.5)
WBC: 2.9 10*3/uL — ABNORMAL LOW (ref 4.0–10.5)
nRBC: 0 % (ref 0.0–0.2)

## 2023-04-05 NOTE — Progress Notes (Signed)
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  -s/p neoadjuvant pembrolizumab/carboplatin Q3 weeks / weekly Taxol- followed by ddAC x4. -s/p left upper outer lumpectomy with sentinel lymph node biopsy  ypTis ypN0.   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 Tonna Boehringer. Hives/allergic reaction after  zometa. Switch prolia  Q6 months.  next due July 2024 Recommend calcium supplementation   Chemotherapy-induced neuropathy (HCC) Patient gabapentin 600 mg twice daily. Patient prefers to defer at this point.  Aromatase inhibitor use Continue calcium supplementation and Vitamin D supplementation.   Port-A-Cath in place Recommend patient to continue port flush every 8 weeks  Osteopenia Continue calcium supplementation.  Prolia Q6 months.  Orders Placed This Encounter  Procedures   CBC with Differential (Cancer Center Only)    Standing Status:   Future    Standing Expiration Date:   04/04/2024   CMP (Cancer Center only)    Standing Status:   Future    Standing Expiration Date:   04/04/2024   Cancer antigen 27.29    Standing Status:   Future    Standing Expiration Date:   04/04/2024   Cancer antigen 15-3    Standing Status:   Future     Standing Expiration Date:   04/04/2024   Follow-up in 3 months, lab prior to MD.   All questions were answered. The patient knows to call the clinic with any problems, questions or concerns.  [name], MD, [name] [facility] Hematology Oncology 04/05/2023     HISTORY OF PRESENTING ILLNESS:   Peggy Bates is a  57 y.o.  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 Oncology history summary listed as below Oncology History Overview Note  # Pathology dated April 07, 2021 from [facility] in [place]:  Ultrasound-guided core biopsy. Nuclear grade 3, high-grade. P53: 70% staining. Ki-67: 70% staining.  ER negative, PR negative, HER2 negative. Mitotic rate of 13 mitoses per high-power field.  The patient brought a copy of her imaging studies with her from [place] and these have been independently reviewed.  Screening mammogram dated March 30, 2021 showed an asymmetric nodular density in the superior lateral aspect of the left breast. The right breast was unremarkable BI-RADS-0.  Ultrasound examination of the left breast showed a 1.1 cm round, nonparallel hypoechoic mass with angular margins. BI-RADS-4.  Family history of breast cancer and a sister in her late 33s, early 59s. No history of genetic testing available at this time.   Breast cancer in female  04/21/2021 Initial Diagnosis   Left breast triple negative cancer, residual ER positive DCIS  -03/30/2021, screening mammogram with 3D 2 nodular areas of asymmetric density demonstrated within the superior lateral aspect of the left breast middle third depth.  Finding was best appreciated on 3D tomosynthesis imaging.  Targeted ultrasound showed left upper outer breast 1:30 position 1.1 cm round hypoechoic mass with angular margins. 04/21/2021  biopsy of the breast mass Pathology showed infiltrating ductal carcinoma, grade 3, Ki-67 70%, ER negative, PR negative, HER2 negative,    Case  was discussed on Tumor board.  Pathology reports and mammogram/ultrasound were done at outside facility and results were reviewed and discussed with patient and her husband.  LN status was not mentioned on her [place]. Recommend additional images with mammogram and MRI, neoadjuvant chemotherapy    05/12/2021 diagnostic mammogram of left breast showed 2.1 cm biopsy-proven malignancy in the lateral left breast at 2:00, 4 cm from nipple.  Directly adjacent cystic mass, 1.5 cm in size, consistent with biopsy hematoma.  1 abnormal and 1 borderline abnormal left axillary lymph node  05/18/2021 bilateral breast MRI with and without contrast showed 2.5 x 3 x 2.5 cm hematoma containing biopsy clip artifact within the upper outer left breast.  1.6 cm nodular non-mass-like enhancement 2.5 cm posterior/superior to the biopsy clip may represent biopsy-proven malignancy or additional areas of malignancy.  Single abnormal appearing left axillary lymph node with eccentric cortical thickening.  No MRI evidence of the right breast malignancy.   05/20/2021 Slide consultation of her left breast biopsy from outside institution - invasive mammary carcinoma, no special type, grade 3, DCIS and LVI not identified. - ER negative, PR negative, HER2 IHC negative. Ki 67 70-80%     05/20/2021 ultrasound-guided left axillary lymph node was negative for malignancy.  06/02/2021 MRI left breast biopsy of the upper outer quadrant - pathology shows high grade DCIS, ER 30%+    04/29/2021 Cancer Staging   Staging form: Breast, AJCC 8th Edition - Clinical stage from 04/29/2021: Stage IIB (cT2, cN0, cM0, G3, ER-, PR-, HER2-) - Signed by [name], MD on 06/04/2021 Stage prefix: Initial diagnosis Histologic grading system: 3 grade system   05/07/2021 Procedure   Mediport was placed by Dr. Lemar Livings   05/25/2021 Genetic Testing   Negative genetic testing. No pathogenic variants identified on the [facility] Multi-Cancer Panel +RNA. The report date is  05/25/2021.  The Multi-Cancer Panel + RNA offered by [facility] includes sequencing and/or deletion duplication testing of the following 84 genes: AIP, ALK, APC, ATM, AXIN2,BAP1,  BARD1, BLM, BMPR1A, BRCA1, BRCA2, BRIP1, CASR, CDC73, CDH1, CDK4, CDKN1B, CDKN1C, CDKN2A (p14ARF), CDKN2A (p16INK4a), CEBPA, CHEK2, CTNNA1, DICER1, DIS3L2, EGFR (c.2369C>T, p.Thr790Met variant only), EPCAM (Deletion/duplication testing only), FH, FLCN, GATA2, GPC3, GREM1 (Promoter region deletion/duplication testing only), HOXB13 (c.251G>A, p.Gly84Glu), HRAS, KIT, MAX, MEN1, MET, MITF (c.952G>A, p.Glu318Lys variant only), MLH1, MSH2, MSH3, MSH6, MUTYH, NBN, NF1, NF2, NTHL1, PALB2, PDGFRA, PHOX2B, PMS2, POLD1, POLE, POT1, PRKAR1A, PTCH1, PTEN, RAD50, RAD51C, RAD51D, RB1, RECQL4, RET, RUNX1, SDHAF2, SDHA (sequence changes only), SDHB, SDHC, SDHD, SMAD4, SMARCA4, SMARCB1, SMARCE1, STK11, SUFU, TERC, TERT, TMEM127, TP53, TSC1, TSC2, VHL, WRN and WT1.   06/08/2021 - 10/28/2021 Chemotherapy   pembrolizumab/carboplatin Q 3 weeks / weekly Taxol followed by ddAC x4 with G-CSF support       06/08/2021 - 10/28/2021 Chemotherapy   Patient is on Treatment Plan : BREAST Pembrolizumab + Carboplatin weekly + Paclitaxel D1,8,15 q21d X 4 cycles /  AC q21d x 4 cycles     08/30/2021 Imaging   MRI breast showed no abnormal enhancement remains in the left breast. Complete response to chemotherapy is identified. No MRI evidence of malignancy in either breast.   11/15/2021 Imaging   bilateral MRI breast showed no abnormal enhancement is identified in bilateral  breast.   12/10/2021 Surgery   patient underwent left upper outer lumpectomy with sentinel lymph node biopsy.  Residual DCIS, high-grade, 4 sentinel lymph nodes were harvested and all negative for malignancy.  Margins are negative for DCIS, closest margin is greater than 5 mm. ypTis ypN0. Residual DCIS ER 11-50%   01/04/2022 - 02/10/2022 Radiation Therapy   adjuvant breast radiation    03/11/2022 -  Anti-estrogen oral therapy   Started on letrozole 2.5 mg daily   05/12/2022 Mammogram   Bilateral diagnostic mammogram/ultrasound showed No mammographic evidence of malignancy   05/12/2022 Imaging   Bone density showed osteopenia FRAX* RESULTS:  10-year Probability of Fracture1 Major Osteoporotic Fracture2 Hip Fracture 2.2% 0.1%      INTERVAL HISTORY Peggy Bates is a 57 y.o. female who has above history reviewed by me today presents for follow up visit for management of left triple negative breast cancer  Patient has been on letrozole 2.5 mg daily.   + arthralgia and stiffness, hot flash. She has no new breast concerns except intermittent breast pain at previous surgery site.   . Review of Systems  Constitutional:  Positive for fatigue. Negative for appetite change, chills and fever.  HENT:   Negative for hearing loss and voice change.   Eyes:  Negative for eye problems.  Respiratory:  Negative for chest tightness and cough.   Cardiovascular:  Negative for chest pain.  Gastrointestinal:  Negative for abdominal distention, abdominal pain and blood in stool.  Endocrine: Positive for hot flashes.  Genitourinary:  Negative for difficulty urinating and frequency.   Musculoskeletal:  Positive for arthralgias.  Skin:  Negative for itching and rash.  Neurological:  Positive for numbness. Negative for extremity weakness.  Hematological:  Negative for adenopathy.  Psychiatric/Behavioral:  Negative for confusion.     MEDICAL HISTORY:  Past Medical History:  Diagnosis Date   Arthritis    knees   Breast cancer    2022   COVID-19 12/2020   Depression    Family history of breast cancer    Goiter    Malignant neoplasm of left female breast 04/07/2021   Personal history of chemotherapy    Personal history of radiation therapy     SURGICAL HISTORY: Past Surgical History:  Procedure Laterality Date   ADJACENT TISSUE TRANSFER/TISSUE REARRANGEMENT Left  12/10/2021   Procedure: ADJACENT TISSUE TRANSFER/TISSUE REARRANGEMENT;  Surgeon: Earline Mayotte, MD;  Location: [facility] OR[facility];  Service: General;  Laterality: Left;   AXILLARY SENTINEL NODE BIOPSY Left 12/10/2021   Procedure: AXILLARY SENTINEL NODE BIOPSY;  Surgeon: Earline Mayotte, MD;  Location: [facility] OR[facility];  Service: General;  Laterality: Left;   BREAST BIOPSY Left 04/07/2021   [facility] in [place]:u/s bx triple neg   BREAST BIOPSY Left 05/20/2021   [place] Axilla Bx, hydro 3 marker, neg   BREAST LUMPECTOMY WITH NEEDLE LOCALIZATION Left 12/10/2021   Procedure: BREAST LUMPECTOMY WITH NEEDLE LOCALIZATION, PECTORAL BLOCK;  Surgeon: Earline Mayotte, MD;  Location: [facility] ORS;  Service: General;  Laterality: Left;   INCISION AND DRAINAGE ABSCESS Left 09/16/2022   Procedure: INCISION AND DRAINAGE ABSCESS;  Surgeon: Earline Mayotte, MD;  Location: [facility] OR[facility];  Service: General;  Laterality: Left;  seroma (not abscess)   KNEE ARTHROSCOPY Right 2008   PORTACATH PLACEMENT Right 05/07/2021   Procedure: INSERTION PORT-A-CATH;  Surgeon: Earline Mayotte, MD;  Location: [facility] OR[facility];  Service: General;  Laterality: Right;   TOOTH EXTRACTION     TRANSCERVICAL UTERINE FIBROID(S) ABLATION  2010    SOCIAL HISTORY: Social History   Socioeconomic History   Marital status: Married    Spouse name: Not on file   Number of children: Not on file   Years of education: Not on file   Highest education level: Not on file  Occupational History   Not on file  Tobacco Use   Smoking status: Never   Smokeless tobacco: Never  Vaping Use   Vaping Use: Never used  Substance and Sexual Activity   Alcohol use: Never   Drug use: Never   Sexual activity: Yes  Other Topics Concern   Not on file  Social History Narrative   ** Merged History Encounter **       Social Determinants of Health   Financial Resource Strain: Not on file  Food Insecurity: Not on file  Transportation Needs: Not on file   Physical Activity: Not on file  Stress: Not on file  Social Connections: Not on file  Intimate Partner Violence: Not on file    FAMILY HISTORY: Family History  Problem Relation Age of Onset   Breast cancer Paternal Grandmother    Breast cancer Sister        dx 30s, recurrence x3   Diabetes Sister    Diabetes Father    Throat cancer Maternal Grandmother     ALLERGIES:  is allergic to other.  MEDICATIONS:  Current Outpatient Medications  Medication Sig Dispense Refill   acetaminophen (TYLENOL) 650 MG CR tablet Take 1,300 mg by mouth See admin instructions. Take 1300 mg in the morning may take a second 1300 mg dose during the day as needed for pain     calcium carbonate (OS-CAL - DOSED IN MG OF ELEMENTAL CALCIUM) 1250 (500 Ca) MG tablet Take 1 tablet by mouth.     cholecalciferol (VITAMIN D) 25 MCG (1000 UNIT) tablet Take 1,000 Units by mouth daily.     diclofenac Sodium (VOLTAREN) 1 % GEL Apply 1 application topically daily.     diphenhydrAMINE (BENADRYL ALLERGY) 25 MG tablet Take 1 tablet (25 mg total) by mouth every 6 (six) hours as needed. 30 tablet 0   gabapentin (NEURONTIN) 600 MG tablet Take 600 mg by mouth every morning.     HYDROcodone-acetaminophen (NORCO/VICODIN) 5-325 MG tablet      letrozole (FEMARA) 2.5 MG tablet Take 1 tablet (2.5 mg total) by mouth daily. 30 tablet 6   lidocaine-prilocaine (EMLA) cream Apply 1 Application topically as needed. Apply small amount of cream over port a cath site 1-2 hours prior to port being accessed. 30 g 2   meloxicam (MOBIC) 15 MG tablet Take 15 mg by mouth daily.     Multiple Vitamins-Minerals (ONE-A-DAY ENERGY PO) Take by mouth.     phentermine (ADIPEX-P) 37.5 MG tablet Take 18.75 mg by mouth daily.     famotidine (PEPCID) 20 MG tablet Take 1 tablet (20 mg total) by mouth 2 (two) times daily for 10 days. (Patient not taking: Reported on 01/03/2023) 20 tablet 0   No current facility-administered medications for this visit.    Facility-Administered Medications Ordered in Other Visits  Medication Dose Route Frequency Provider Last Rate Last Admin   heparin lock flush 100 unit/mL  500 Units Intravenous Once [name], MD       heparin lock flush 100 unit/mL  500 Units Intravenous Once [name], MD       sodium chloride flush (NS) 0.9 % injection 10 mL  10 mL Intravenous Once [name],  MD       sodium chloride flush (NS) 0.9 % injection 10 mL  10 mL Intravenous PRN [name], MD         PHYSICAL EXAMINATION: ECOG PERFORMANCE STATUS: 0 - Asymptomatic Vitals:   04/05/23 1009  BP: (!) 130/92  Pulse: 79  Resp: 18  Temp: (!) 97 F (36.1 C)  SpO2: 100%   Filed Weights   04/05/23 1009  Weight: 207 lb (93.9 kg)    Physical Exam Constitutional:      General: She is not in acute distress. HENT:     Head: Normocephalic and atraumatic.  Eyes:     General: No scleral icterus. Cardiovascular:     Rate and Rhythm: Normal rate and regular rhythm.     Heart sounds: Normal heart sounds.  Pulmonary:     Effort: Pulmonary effort is normal. No respiratory distress.     Breath sounds: No wheezing.  Abdominal:     General: Bowel sounds are normal. There is no distension.     Palpations: Abdomen is soft.  Musculoskeletal:        General: No deformity. Normal range of motion.     Cervical back: Normal range of motion and neck supple.  Skin:    General: Skin is warm and dry.     Findings: No erythema or rash.  Neurological:     Mental Status: She is alert and oriented to person, place, and time. Mental status is at baseline.     Cranial Nerves: No cranial nerve deficit.     Coordination: Coordination normal.  Psychiatric:        Mood and Affect: Mood normal.    Left breast no palpable mass, no palpable axillary lymphadenopathy  LABORATORY DATA:  I have reviewed the data as listed    Latest Ref Rng & Units 04/05/2023    9:57 AM 01/03/2023   10:00 AM 10/11/2022    2:44 PM  CBC  WBC 4.0 - 10.5 K/uL 2.9   2.8  3.8   Hemoglobin 12.0 - 15.0 g/dL 16.1  09.6  04.5   Hematocrit 36.0 - 46.0 % 37.1  37.4  38.9   Platelets 150 - 400 K/uL 172  175  179       Latest Ref Rng & Units 04/05/2023    9:57 AM 01/03/2023   10:00 AM 10/11/2022    2:44 PM  CMP  Glucose 70 - 99 mg/dL 96  409  95   BUN 6 - 20 mg/dL 20  17  25    Creatinine 0.44 - 1.00 mg/dL 8.11  9.14  7.82   Sodium 135 - 145 mmol/L 138  136  137   Potassium 3.5 - 5.1 mmol/L 4.0  3.6  4.2   Chloride 98 - 111 mmol/L 106  105  108   CO2 22 - 32 mmol/L 26  24  23    Calcium 8.9 - 10.3 mg/dL 9.8  9.0  9.5   Total Protein 6.5 - 8.1 g/dL 7.8  7.3    Total Bilirubin 0.3 - 1.2 mg/dL 0.6  0.4    Alkaline Phos 38 - 126 U/L 42  51    AST 15 - 41 U/L 29  25    ALT 0 - 44 U/L 28  21      RADIOGRAPHIC STUDIES: I have personally reviewed the radiological images as listed and agreed with the findings in the report. No results found.

## 2023-04-05 NOTE — Assessment & Plan Note (Addendum)
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  -s/p neoadjuvant pembrolizumab/carboplatin Q3 weeks / weekly Taxol- followed by ddAC x4. -s/p left upper outer lumpectomy with sentinel lymph node biopsy  ypTis ypN0.   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 Tonna Boehringer. Hives/allergic reaction after  zometa. Switch prolia 60mg  Q6 months.  next due July 2024 Recommend calcium supplementation

## 2023-04-05 NOTE — Assessment & Plan Note (Signed)
Continue calcium supplementation.  Prolia Q6 months. 

## 2023-04-05 NOTE — Progress Notes (Signed)
Patient here for oncology follow-up appointment, concerns of hot flashes from letrozole affecting ability to work

## 2023-04-05 NOTE — Assessment & Plan Note (Signed)
Recommend patient to continue port flush every 8 weeks 

## 2023-04-05 NOTE — Assessment & Plan Note (Addendum)
Continue calcium supplementation and Vitamin D supplementation.

## 2023-04-05 NOTE — Assessment & Plan Note (Addendum)
Patient gabapentin 600 mg twice daily. Patient prefers to defer at this point.

## 2023-04-06 LAB — CANCER ANTIGEN 27.29: CA 27.29: <9 U/mL (ref 0.0–38.6)

## 2023-04-07 LAB — CANCER ANTIGEN 15-3: CA 15-3: 4.4 U/mL (ref 0.0–25.0)

## 2023-04-25 ENCOUNTER — Inpatient Hospital Stay: Attending: Oncology

## 2023-04-25 DIAGNOSIS — Z452 Encounter for adjustment and management of vascular access device: Secondary | ICD-10-CM | POA: Insufficient documentation

## 2023-04-25 DIAGNOSIS — C50812 Malignant neoplasm of overlapping sites of left female breast: Secondary | ICD-10-CM | POA: Insufficient documentation

## 2023-04-25 DIAGNOSIS — Z171 Estrogen receptor negative status [ER-]: Secondary | ICD-10-CM | POA: Insufficient documentation

## 2023-04-25 DIAGNOSIS — Z95828 Presence of other vascular implants and grafts: Secondary | ICD-10-CM

## 2023-04-25 MED ORDER — SODIUM CHLORIDE 0.9% FLUSH
10.0000 mL | Freq: Once | INTRAVENOUS | Status: AC
  Administered 2023-04-25: 10 mL via INTRAVENOUS
  Filled 2023-04-25: qty 10

## 2023-04-25 MED ORDER — HEPARIN SOD (PORK) LOCK FLUSH 100 UNIT/ML IV SOLN
500.0000 [IU] | Freq: Once | INTRAVENOUS | Status: AC
  Administered 2023-04-25: 500 [IU] via INTRAVENOUS
  Filled 2023-04-25: qty 5

## 2023-05-16 ENCOUNTER — Ambulatory Visit
Admission: RE | Admit: 2023-05-16 | Discharge: 2023-05-16 | Disposition: A | Discharge status: Home / Self Care | Source: Ambulatory Visit | Attending: Surgery | Admitting: Surgery

## 2023-05-16 ENCOUNTER — Inpatient Hospital Stay

## 2023-05-16 DIAGNOSIS — Z853 Personal history of malignant neoplasm of breast: Secondary | ICD-10-CM

## 2023-06-20 ENCOUNTER — Inpatient Hospital Stay

## 2023-07-04 ENCOUNTER — Inpatient Hospital Stay (HOSPITAL_BASED_OUTPATIENT_CLINIC_OR_DEPARTMENT_OTHER): Admitting: Oncology

## 2023-07-04 ENCOUNTER — Inpatient Hospital Stay: Attending: Oncology

## 2023-07-04 ENCOUNTER — Inpatient Hospital Stay

## 2023-07-04 ENCOUNTER — Encounter: Payer: Self-pay | Admitting: Oncology

## 2023-07-04 VITALS — BP 127/88 | HR 82 | Temp 97.1°F | Resp 18 | Wt 205.7 lb

## 2023-07-04 DIAGNOSIS — C50412 Malignant neoplasm of upper-outer quadrant of left female breast: Secondary | ICD-10-CM | POA: Diagnosis present

## 2023-07-04 DIAGNOSIS — Z923 Personal history of irradiation: Secondary | ICD-10-CM | POA: Insufficient documentation

## 2023-07-04 DIAGNOSIS — C50812 Malignant neoplasm of overlapping sites of left female breast: Secondary | ICD-10-CM

## 2023-07-04 DIAGNOSIS — Z9221 Personal history of antineoplastic chemotherapy: Secondary | ICD-10-CM | POA: Insufficient documentation

## 2023-07-04 DIAGNOSIS — Z803 Family history of malignant neoplasm of breast: Secondary | ICD-10-CM | POA: Insufficient documentation

## 2023-07-04 DIAGNOSIS — R232 Flushing: Secondary | ICD-10-CM | POA: Diagnosis not present

## 2023-07-04 DIAGNOSIS — Z79899 Other long term (current) drug therapy: Secondary | ICD-10-CM | POA: Diagnosis not present

## 2023-07-04 DIAGNOSIS — R5383 Other fatigue: Secondary | ICD-10-CM | POA: Diagnosis not present

## 2023-07-04 DIAGNOSIS — M8589 Other specified disorders of bone density and structure, multiple sites: Secondary | ICD-10-CM

## 2023-07-04 DIAGNOSIS — Z95828 Presence of other vascular implants and grafts: Secondary | ICD-10-CM

## 2023-07-04 DIAGNOSIS — T451X5D Adverse effect of antineoplastic and immunosuppressive drugs, subsequent encounter: Secondary | ICD-10-CM | POA: Insufficient documentation

## 2023-07-04 DIAGNOSIS — Z79811 Long term (current) use of aromatase inhibitors: Secondary | ICD-10-CM | POA: Insufficient documentation

## 2023-07-04 DIAGNOSIS — Z171 Estrogen receptor negative status [ER-]: Secondary | ICD-10-CM

## 2023-07-04 DIAGNOSIS — M8580 Other specified disorders of bone density and structure, unspecified site: Secondary | ICD-10-CM | POA: Insufficient documentation

## 2023-07-04 DIAGNOSIS — T451X5A Adverse effect of antineoplastic and immunosuppressive drugs, initial encounter: Secondary | ICD-10-CM

## 2023-07-04 DIAGNOSIS — G620 Drug-induced polyneuropathy: Secondary | ICD-10-CM | POA: Diagnosis not present

## 2023-07-04 LAB — CBC WITH DIFFERENTIAL (CANCER CENTER ONLY)
Abs Immature Granulocytes: 0.01 10*3/uL (ref 0.00–0.07)
Basophils Absolute: 0 10*3/uL (ref 0.0–0.1)
Basophils Relative: 0 %
Eosinophils Absolute: 0 10*3/uL (ref 0.0–0.5)
Eosinophils Relative: 0 %
HCT: 37.5 % (ref 36.0–46.0)
Hemoglobin: 12.2 g/dL (ref 12.0–15.0)
Immature Granulocytes: 0 %
Lymphocytes Relative: 47 %
Lymphs Abs: 1.2 10*3/uL (ref 0.7–4.0)
MCH: 29 pg (ref 26.0–34.0)
MCHC: 32.5 g/dL (ref 30.0–36.0)
MCV: 89.3 fL (ref 80.0–100.0)
Monocytes Absolute: 0.2 10*3/uL (ref 0.1–1.0)
Monocytes Relative: 9 %
Neutro Abs: 1.1 10*3/uL — ABNORMAL LOW (ref 1.7–7.7)
Neutrophils Relative %: 44 %
Platelet Count: 178 10*3/uL (ref 150–400)
RBC: 4.2 MIL/uL (ref 3.87–5.11)
RDW: 13 % (ref 11.5–15.5)
WBC Count: 2.5 10*3/uL — ABNORMAL LOW (ref 4.0–10.5)
nRBC: 0 % (ref 0.0–0.2)

## 2023-07-04 LAB — CMP (CANCER CENTER ONLY)
ALT: 21 U/L (ref 0–44)
AST: 25 U/L (ref 15–41)
Albumin: 4.7 g/dL (ref 3.5–5.0)
Alkaline Phosphatase: 43 U/L (ref 38–126)
Anion gap: 8 (ref 5–15)
BUN: 23 mg/dL — ABNORMAL HIGH (ref 6–20)
CO2: 25 mmol/L (ref 22–32)
Calcium: 10.1 mg/dL (ref 8.9–10.3)
Chloride: 104 mmol/L (ref 98–111)
Creatinine: 0.78 mg/dL (ref 0.44–1.00)
GFR, Estimated: >60 mL/min (ref >60)
Glucose, Bld: 92 mg/dL (ref 70–99)
Potassium: 3.7 mmol/L (ref 3.5–5.1)
Sodium: 137 mmol/L (ref 135–145)
Total Bilirubin: 0.5 mg/dL (ref 0.3–1.2)
Total Protein: 8 g/dL (ref 6.5–8.1)

## 2023-07-04 MED ORDER — DENOSUMAB 60 MG/ML ~~LOC~~ SOSY
60.0000 mg | PREFILLED_SYRINGE | Freq: Once | SUBCUTANEOUS | Status: AC
  Administered 2023-07-04: 60 mg via SUBCUTANEOUS
  Filled 2023-07-04: qty 1

## 2023-07-04 MED ORDER — SODIUM CHLORIDE 0.9% FLUSH
10.0000 mL | INTRAVENOUS | Status: DC | PRN
  Filled 2023-07-04: qty 10

## 2023-07-04 MED ORDER — HEPARIN SOD (PORK) LOCK FLUSH 100 UNIT/ML IV SOLN
500.0000 [IU] | Freq: Once | INTRAVENOUS | Status: DC
  Filled 2023-07-04: qty 5

## 2023-07-04 NOTE — Assessment & Plan Note (Addendum)
Continue calcium supplementation.  Prolia Q6 months- proceed today, next due Jan 2025

## 2023-07-04 NOTE — Assessment & Plan Note (Signed)
Continue calcium supplementation and Vitamin D supplementation.

## 2023-07-04 NOTE — Assessment & Plan Note (Addendum)
 Continue gabapentin 600 mg twice daily. 

## 2023-07-04 NOTE — Patient Instructions (Signed)

## 2023-07-04 NOTE — Assessment & Plan Note (Addendum)
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  -s/p neoadjuvant pembrolizumab/carboplatin Q3 weeks / weekly Taxol- followed by ddAC x4. -s/p left upper outer lumpectomy with sentinel lymph node biopsy  ypTis ypN0.   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 Tonna Boehringer. Most recent results were reviewed with patient.  Hives/allergic reaction after  zometa. Switch prolia 60mg  Q6 months.   Recommend calcium supplementation

## 2023-07-04 NOTE — Assessment & Plan Note (Addendum)
Recommend patient to continue port flush every 8 weeks  Shared decision was to keep port for 2 years after surgery- till Dec 2024

## 2023-07-04 NOTE — Progress Notes (Signed)
Pt here for follow up. No new concerns, no new breast problems 

## 2023-07-04 NOTE — Progress Notes (Signed)
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  -s/p neoadjuvant pembrolizumab/carboplatin Q3 weeks / weekly Taxol- followed by ddAC x4. -s/p left upper outer lumpectomy with sentinel lymph node biopsy  ypTis ypN0.   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 Tonna Boehringer. Most recent results were reviewed with patient.  Hives/allergic reaction after  zometa. Switch prolia 60mg  Q6 months.   Recommend calcium supplementation   Chemotherapy-induced neuropathy (HCC) Continue gabapentin 600 mg twice daily.   Aromatase inhibitor use Continue calcium supplementation and Vitamin D supplementation.   Port-A-Cath in place Recommend patient to continue port flush every 8 weeks  Shared decision was to keep port for 2 years after surgery- till Dec 2024   Osteopenia Continue calcium supplementation.  Prolia Q6 months- proceed today, next due Jan 2025  Orders Placed This Encounter  Procedures   CBC with Differential (Cancer Center Only)    Standing Status:   Future    Standing Expiration Date:   07/03/2024   CMP (Cancer Center only)    Standing Status:   Future    Standing Expiration Date:   07/03/2024   Cancer antigen 27.29    Standing Status:    Future    Standing Expiration Date:   07/03/2024   Cancer antigen 15-3    Standing Status:   Future    Standing Expiration Date:   07/03/2024   Follow-up in 3 months, lab prior to MD.   All questions were answered. The patient knows to call the clinic with any problems, questions or concerns.  [name], MD, [name] [facility] Hematology Oncology 07/04/2023     HISTORY OF PRESENTING ILLNESS:   Peggy Bates is a  57 y.o.  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 Oncology history summary listed as below Oncology History Overview Note  # Pathology dated April 07, 2021 from [facility] [place]:  Ultrasound-guided core biopsy. Nuclear grade 3, high-grade. P53: 70% staining. Ki-67: 70% staining.  ER negative, PR negative, HER2 negative. Mitotic rate of 13 mitoses per high-power field.  The patient brought a copy of her imaging studies with her from [place] and these have been independently reviewed.  Screening mammogram dated March 30, 2021 showed an asymmetric nodular density in the superior lateral aspect of the left breast. The right breast was unremarkable BI-RADS-0.  Ultrasound examination of the left breast showed a 1.1 cm round, nonparallel hypoechoic mass with angular margins. BI-RADS-4.  Family history of breast cancer and a sister in her late 91s, early 53s. No history of genetic testing available at this time.   Breast cancer in female [facility])  04/21/2021 Initial Diagnosis   Left breast triple negative cancer, residual ER positive DCIS  -03/30/2021, screening mammogram with 3D 2 nodular areas of asymmetric  density demonstrated within the superior lateral aspect of the left breast middle third depth.  Finding was best appreciated on 3D tomosynthesis imaging. Targeted ultrasound showed left upper outer breast 1:30 position 1.1 cm round hypoechoic mass with angular margins. 04/21/2021  biopsy of the breast  mass Pathology showed infiltrating ductal carcinoma, grade 3, Ki-67 70%, ER negative, PR negative, HER2 negative,    Case was discussed on Tumor board.  Pathology reports and mammogram/ultrasound were done at outside facility and results were reviewed and discussed with patient and her husband.  LN status was not mentioned on her [place]. Recommend additional images with mammogram and MRI, neoadjuvant chemotherapy    05/12/2021 diagnostic mammogram of left breast showed 2.1 cm biopsy-proven malignancy in the lateral left breast at 2:00, 4 cm from nipple.  Directly adjacent cystic mass, 1.5 cm in size, consistent with biopsy hematoma.  1 abnormal and 1 borderline abnormal left axillary lymph node  05/18/2021 bilateral breast MRI with and without contrast showed 2.5 x 3 x 2.5 cm hematoma containing biopsy clip artifact within the upper outer left breast.  1.6 cm nodular non-mass-like enhancement 2.5 cm posterior/superior to the biopsy clip may represent biopsy-proven malignancy or additional areas of malignancy.  Single abnormal appearing left axillary lymph node with eccentric cortical thickening.  No MRI evidence of the right breast malignancy.   05/20/2021 Slide consultation of her left breast biopsy from outside institution - invasive mammary carcinoma, no special type, grade 3, DCIS and LVI not identified. - ER negative, PR negative, HER2 IHC negative. Ki 67 70-80%     05/20/2021 ultrasound-guided left axillary lymph node was negative for malignancy.  06/02/2021 MRI left breast biopsy of the upper outer quadrant - pathology shows high grade DCIS, ER 30%+    04/29/2021 Cancer Staging   Staging form: Breast, AJCC 8th Edition - Clinical stage from 04/29/2021: Stage IIB (cT2, cN0, cM0, G3, ER-, PR-, HER2-) - Signed by [name], MD on 06/04/2021 Stage prefix: Initial diagnosis Histologic grading system: 3 grade system   05/07/2021 Procedure   Mediport was placed by Dr. Lemar Livings   05/25/2021 Genetic Testing    Negative genetic testing. No pathogenic variants identified on the [facility] Multi-Cancer Panel +RNA. The report date is 05/25/2021.  The Multi-Cancer Panel + RNA offered by [facility] includes sequencing and/or deletion duplication testing of the following 84 genes: AIP, ALK, APC, ATM, AXIN2,BAP1,  BARD1, BLM, BMPR1A, BRCA1, BRCA2, BRIP1, CASR, CDC73, CDH1, CDK4, CDKN1B, CDKN1C, CDKN2A (p14ARF), CDKN2A (p16INK4a), CEBPA, CHEK2, CTNNA1, DICER1, DIS3L2, EGFR (c.2369C>T, p.Thr790Met variant only), EPCAM (Deletion/duplication testing only), FH, FLCN, GATA2, GPC3, GREM1 (Promoter region deletion/duplication testing only), HOXB13 (c.251G>A, p.Gly84Glu), HRAS, KIT, MAX, MEN1, MET, MITF (c.952G>A, p.Glu318Lys variant only), MLH1, MSH2, MSH3, MSH6, MUTYH, NBN, NF1, NF2, NTHL1, PALB2, PDGFRA, PHOX2B, PMS2, POLD1, POLE, POT1, PRKAR1A, PTCH1, PTEN, RAD50, RAD51C, RAD51D, RB1, RECQL4, RET, RUNX1, SDHAF2, SDHA (sequence changes only), SDHB, SDHC, SDHD, SMAD4, SMARCA4, SMARCB1, SMARCE1, STK11, SUFU, TERC, TERT, TMEM127, TP53, TSC1, TSC2, VHL, WRN and WT1.   06/08/2021 - 10/28/2021 Chemotherapy   pembrolizumab/carboplatin Q 3 weeks / weekly Taxol followed by ddAC x4 with G-CSF support       06/08/2021 - 10/28/2021 Chemotherapy   Patient is on Treatment Plan : BREAST Pembrolizumab + Carboplatin weekly + Paclitaxel D1,8,15 q21d X 4 cycles /  AC q21d x 4 cycles     08/30/2021 Imaging   MRI breast showed no abnormal enhancement remains in the left breast. Complete response to chemotherapy is identified. No MRI  evidence of malignancy in either breast.   11/15/2021 Imaging   bilateral MRI breast showed no abnormal enhancement is identified in bilateral breast.   12/10/2021 Surgery   patient underwent left upper outer lumpectomy with sentinel lymph node biopsy.  Residual DCIS, high-grade, 4 sentinel lymph nodes were harvested and all negative for malignancy.  Margins are negative for DCIS, closest margin is greater than 5 mm.  ypTis ypN0. Residual DCIS ER 11-50%   01/04/2022 - 02/10/2022 Radiation Therapy   adjuvant breast radiation   03/11/2022 -  Anti-estrogen oral therapy   Started on letrozole 2.5 mg daily   05/12/2022 Mammogram   Bilateral diagnostic mammogram/ultrasound showed No mammographic evidence of malignancy   05/12/2022 Imaging   Bone density showed osteopenia FRAX* RESULTS:  10-year Probability of Fracture1 Major Osteoporotic Fracture2 Hip Fracture 2.2% 0.1%      INTERVAL HISTORY Peggy Bates is a 57 y.o. female who has above history reviewed by me today presents for follow up visit for management of left triple negative breast cancer  Patient has been on letrozole 2.5 mg daily.   + arthralgia and stiffness, hot flash. She has no new breast concerns except intermittent breast pain at previous surgery site.   . Review of Systems  Constitutional:  Positive for fatigue. Negative for appetite change, chills and fever.  HENT:   Negative for hearing loss and voice change.   Eyes:  Negative for eye problems.  Respiratory:  Negative for chest tightness and cough.   Cardiovascular:  Negative for chest pain.  Gastrointestinal:  Negative for abdominal distention, abdominal pain and blood in stool.  Endocrine: Positive for hot flashes.  Genitourinary:  Negative for difficulty urinating and frequency.   Musculoskeletal:  Positive for arthralgias.  Skin:  Negative for itching and rash.  Neurological:  Positive for numbness. Negative for extremity weakness.  Hematological:  Negative for adenopathy.  Psychiatric/Behavioral:  Negative for confusion.     MEDICAL HISTORY:  Past Medical History:  Diagnosis Date   Arthritis    knees   Breast cancer (HCC)    2022   COVID-19 12/2020   Depression    Family history of breast cancer    Goiter    Malignant neoplasm of left female breast (HCC) 04/07/2021   Personal history of chemotherapy    Personal history of radiation therapy      SURGICAL HISTORY: Past Surgical History:  Procedure Laterality Date   ADJACENT TISSUE TRANSFER/TISSUE REARRANGEMENT Left 12/10/2021   Procedure: ADJACENT TISSUE TRANSFER/TISSUE REARRANGEMENT;  Surgeon: Earline Mayotte, MD;  Location: [facility] ORS;  Service: General;  Laterality: Left;   AXILLARY SENTINEL NODE BIOPSY Left 12/10/2021   Procedure: AXILLARY SENTINEL NODE BIOPSY;  Surgeon: Earline Mayotte, MD;  Location: [facility] ORS;  Service: General;  Laterality: Left;   BREAST BIOPSY Left 04/07/2021   [facility] in [place]:u/s bx triple neg   BREAST BIOPSY Left 05/20/2021   [place] Axilla Bx, hydro 3 marker, neg   BREAST LUMPECTOMY WITH NEEDLE LOCALIZATION Left 12/10/2021   Procedure: BREAST LUMPECTOMY WITH NEEDLE LOCALIZATION, PECTORAL BLOCK;  Surgeon: Earline Mayotte, MD;  Location: [facility] ORS;  Service: General;  Laterality: Left;   INCISION AND DRAINAGE ABSCESS Left 09/16/2022   Procedure: INCISION AND DRAINAGE ABSCESS;  Surgeon: Earline Mayotte, MD;  Location: [facility] ORS;  Service: General;  Laterality: Left;  seroma (not abscess)   KNEE ARTHROSCOPY Right 2008   PORTACATH PLACEMENT Right 05/07/2021   Procedure: INSERTION PORT-A-CATH;  Surgeon: Donnalee Curry  W, MD;  Location: [facility] ORS;  Service: General;  Laterality: Right;   TOOTH EXTRACTION     TRANSCERVICAL UTERINE FIBROID(S) ABLATION  2010    SOCIAL HISTORY: Social History   Socioeconomic History   Marital status: Married    Spouse name: Not on file   Number of children: Not on file   Years of education: Not on file   Highest education level: Not on file  Occupational History   Not on file  Tobacco Use   Smoking status: Never   Smokeless tobacco: Never  Vaping Use   Vaping status: Never Used  Substance and Sexual Activity   Alcohol use: Never   Drug use: Never   Sexual activity: Yes  Other Topics Concern   Not on file  Social History Narrative   ** Merged History Encounter **       Social  Determinants of Health   Financial Resource Strain: Not on file  Food Insecurity: Not on file  Transportation Needs: Not on file  Physical Activity: Not on file  Stress: Not on file  Social Connections: Not on file  Intimate Partner Violence: Not on file    FAMILY HISTORY: Family History  Problem Relation Age of Onset   Breast cancer Paternal Grandmother    Breast cancer Sister        dx 30s, recurrence x3   Diabetes Sister    Diabetes Father    Throat cancer Maternal Grandmother     ALLERGIES:  is allergic to other.  MEDICATIONS:  Current Outpatient Medications  Medication Sig Dispense Refill   acetaminophen (TYLENOL) 650 MG CR tablet Take 1,300 mg by mouth See admin instructions. Take 1300 mg in the morning may take a second 1300 mg dose during the day as needed for pain     calcium carbonate (OS-CAL) 600 MG TABS tablet Take 2 tablets by mouth daily with breakfast.     cholecalciferol (VITAMIN D) 25 MCG (1000 UNIT) tablet Take 1,000 Units by mouth daily.     diclofenac Sodium (VOLTAREN) 1 % GEL Apply 1 application  topically daily as needed.     gabapentin (NEURONTIN) 600 MG tablet Take 600 mg by mouth every morning.     HYDROcodone-acetaminophen (NORCO/VICODIN) 5-325 MG tablet      letrozole (FEMARA) 2.5 MG tablet Take 1 tablet (2.5 mg total) by mouth daily. 30 tablet 6   lidocaine-prilocaine (EMLA) cream Apply 1 Application topically as needed. Apply small amount of cream over port a cath site 1-2 hours prior to port being accessed. 30 g 2   Multiple Vitamins-Minerals (MULTIVITAMIN ADULT) CHEW Chew by mouth.     phentermine (ADIPEX-P) 37.5 MG tablet Take 18.75 mg by mouth daily.     No current facility-administered medications for this visit.   Facility-Administered Medications Ordered in Other Visits  Medication Dose Route Frequency Provider Last Rate Last Admin   heparin lock flush 100 unit/mL  500 Units Intravenous Once [name], MD       heparin lock flush 100  unit/mL  500 Units Intravenous Once [name], MD       sodium chloride flush (NS) 0.9 % injection 10 mL  10 mL Intravenous Once [name], MD       sodium chloride flush (NS) 0.9 % injection 10 mL  10 mL Intravenous PRN [name], MD         PHYSICAL EXAMINATION: ECOG PERFORMANCE STATUS: 0 - Asymptomatic Vitals:   07/04/23 1008  BP: 127/88  Pulse: 82  Resp: 18  Temp: (!) 97.1 F (36.2 C)   Filed Weights   07/04/23 1008  Weight: 205 lb 11.2 oz (93.3 kg)    Physical Exam Constitutional:      General: She is not in acute distress. HENT:     Head: Normocephalic and atraumatic.  Eyes:     General: No scleral icterus. Cardiovascular:     Rate and Rhythm: Normal rate and regular rhythm.     Heart sounds: Normal heart sounds.  Pulmonary:     Effort: Pulmonary effort is normal. No respiratory distress.     Breath sounds: No wheezing.  Abdominal:     General: Bowel sounds are normal. There is no distension.     Palpations: Abdomen is soft.  Musculoskeletal:        General: No deformity. Normal range of motion.     Cervical back: Normal range of motion and neck supple.  Skin:    General: Skin is warm and dry.     Findings: No erythema or rash.  Neurological:     Mental Status: She is alert and oriented to person, place, and time. Mental status is at baseline.     Cranial Nerves: No cranial nerve deficit.     Coordination: Coordination normal.  Psychiatric:        Mood and Affect: Mood normal.    Left breast no palpable mass, no palpable axillary lymphadenopathy  LABORATORY DATA:  I have reviewed the data as listed    Latest Ref Rng & Units 07/04/2023    9:48 AM 04/05/2023    9:57 AM 01/03/2023   10:00 AM  CBC  WBC 4.0 - 10.5 K/uL 2.5  2.9  2.8   Hemoglobin 12.0 - 15.0 g/dL 14.7  82.9  56.2   Hematocrit 36.0 - 46.0 % 37.5  37.1  37.4   Platelets 150 - 400 K/uL 178  172  175       Latest Ref Rng & Units 07/04/2023    9:48 AM 04/05/2023    9:57 AM 01/03/2023   10:00 AM   CMP  Glucose 70 - 99 mg/dL 92  96  130   BUN 6 - 20 mg/dL 23  20  17    Creatinine 0.44 - 1.00 mg/dL 8.65  7.84  6.96   Sodium 135 - 145 mmol/L 137  138  136   Potassium 3.5 - 5.1 mmol/L 3.7  4.0  3.6   Chloride 98 - 111 mmol/L 104  106  105   CO2 22 - 32 mmol/L 25  26  24    Calcium 8.9 - 10.3 mg/dL 29.5  9.8  9.0   Total Protein 6.5 - 8.1 g/dL 8.0  7.8  7.3   Total Bilirubin 0.3 - 1.2 mg/dL 0.5  0.6  0.4   Alkaline Phos 38 - 126 U/L 43  42  51   AST 15 - 41 U/L 25  29  25    ALT 0 - 44 U/L 21  28  21      RADIOGRAPHIC STUDIES: I have personally reviewed the radiological images as listed and agreed with the findings in the report. MM 3D DIAGNOSTIC MAMMOGRAM BILATERAL BREAST  Result Date: 05/16/2023 CLINICAL DATA:  Malignant left lumpectomy in December 2022 with neoadjuvant chemotherapy and radiation. EXAM: DIGITAL DIAGNOSTIC BILATERAL MAMMOGRAM WITH TOMOSYNTHESIS TECHNIQUE: Bilateral digital diagnostic mammography and breast tomosynthesis was performed. COMPARISON:  Previous exam(s). ACR Breast Density Category b: There are scattered areas of fibroglandular  density. FINDINGS: Stable postsurgical changes are demonstrated in the upper outer left breast at posterior depth. Otherwise, no new or suspicious findings identified. The parenchymal pattern is unchanged. IMPRESSION: 1. No mammographic evidence of malignancy in either breast. 2. Stable left breast posttreatment changes. RECOMMENDATION: Diagnostic mammogram is suggested in 1 year. (Code:[id number]-B-01Y) I have discussed the findings and recommendations with the patient. If applicable, a reminder letter will be sent to the patient regarding the next appointment. BI-RADS CATEGORY  2: Benign. Electronically Signed   By: Sande Brothers M.D.   On: 05/16/2023 14:21

## 2023-07-05 LAB — CANCER ANTIGEN 15-3: CA 15-3: 4.6 U/mL (ref 0.0–25.0)

## 2023-07-05 LAB — CANCER ANTIGEN 27.29: CA 27.29: <9 U/mL (ref 0.0–38.6)

## 2023-07-10 ENCOUNTER — Inpatient Hospital Stay

## 2023-08-11 ENCOUNTER — Other Ambulatory Visit: Payer: Self-pay

## 2023-08-11 MED ORDER — LETROZOLE 2.5 MG PO TABS: 2.5000 mg | ORAL_TABLET | Freq: Every day | ORAL | 6 refills | Status: DC

## 2023-08-15 ENCOUNTER — Inpatient Hospital Stay: Attending: Oncology

## 2023-08-15 DIAGNOSIS — Z452 Encounter for adjustment and management of vascular access device: Secondary | ICD-10-CM | POA: Diagnosis present

## 2023-08-15 DIAGNOSIS — Z171 Estrogen receptor negative status [ER-]: Secondary | ICD-10-CM | POA: Insufficient documentation

## 2023-08-15 DIAGNOSIS — C50412 Malignant neoplasm of upper-outer quadrant of left female breast: Secondary | ICD-10-CM | POA: Insufficient documentation

## 2023-08-15 DIAGNOSIS — Z95828 Presence of other vascular implants and grafts: Secondary | ICD-10-CM

## 2023-08-15 MED ORDER — SODIUM CHLORIDE 0.9% FLUSH
10.0000 mL | Freq: Once | INTRAVENOUS | Status: AC
  Administered 2023-08-15: 10 mL via INTRAVENOUS
  Filled 2023-08-15: qty 10

## 2023-08-15 MED ORDER — HEPARIN SOD (PORK) LOCK FLUSH 100 UNIT/ML IV SOLN
500.0000 [IU] | Freq: Once | INTRAVENOUS | Status: AC
  Administered 2023-08-15: 500 [IU] via INTRAVENOUS
  Filled 2023-08-15: qty 5

## 2023-09-04 ENCOUNTER — Inpatient Hospital Stay

## 2023-10-10 ENCOUNTER — Inpatient Hospital Stay: Attending: Oncology

## 2023-10-10 ENCOUNTER — Telehealth: Payer: Self-pay

## 2023-10-10 ENCOUNTER — Inpatient Hospital Stay (HOSPITAL_BASED_OUTPATIENT_CLINIC_OR_DEPARTMENT_OTHER): Admitting: Oncology

## 2023-10-10 ENCOUNTER — Encounter: Payer: Self-pay | Admitting: Oncology

## 2023-10-10 ENCOUNTER — Inpatient Hospital Stay

## 2023-10-10 VITALS — BP 120/83 | HR 95 | Temp 97.2°F | Resp 18 | Wt 201.5 lb

## 2023-10-10 DIAGNOSIS — Z9221 Personal history of antineoplastic chemotherapy: Secondary | ICD-10-CM | POA: Insufficient documentation

## 2023-10-10 DIAGNOSIS — Z803 Family history of malignant neoplasm of breast: Secondary | ICD-10-CM | POA: Diagnosis not present

## 2023-10-10 DIAGNOSIS — C50412 Malignant neoplasm of upper-outer quadrant of left female breast: Secondary | ICD-10-CM | POA: Insufficient documentation

## 2023-10-10 DIAGNOSIS — G620 Drug-induced polyneuropathy: Secondary | ICD-10-CM | POA: Diagnosis not present

## 2023-10-10 DIAGNOSIS — R232 Flushing: Secondary | ICD-10-CM | POA: Diagnosis not present

## 2023-10-10 DIAGNOSIS — M2550 Pain in unspecified joint: Secondary | ICD-10-CM | POA: Diagnosis not present

## 2023-10-10 DIAGNOSIS — Z452 Encounter for adjustment and management of vascular access device: Secondary | ICD-10-CM | POA: Insufficient documentation

## 2023-10-10 DIAGNOSIS — T451X5D Adverse effect of antineoplastic and immunosuppressive drugs, subsequent encounter: Secondary | ICD-10-CM | POA: Insufficient documentation

## 2023-10-10 DIAGNOSIS — Z171 Estrogen receptor negative status [ER-]: Secondary | ICD-10-CM | POA: Diagnosis not present

## 2023-10-10 DIAGNOSIS — T451X5A Adverse effect of antineoplastic and immunosuppressive drugs, initial encounter: Secondary | ICD-10-CM

## 2023-10-10 DIAGNOSIS — R5383 Other fatigue: Secondary | ICD-10-CM | POA: Insufficient documentation

## 2023-10-10 DIAGNOSIS — Z79899 Other long term (current) drug therapy: Secondary | ICD-10-CM | POA: Diagnosis not present

## 2023-10-10 DIAGNOSIS — Z79811 Long term (current) use of aromatase inhibitors: Secondary | ICD-10-CM | POA: Insufficient documentation

## 2023-10-10 DIAGNOSIS — C50812 Malignant neoplasm of overlapping sites of left female breast: Secondary | ICD-10-CM

## 2023-10-10 DIAGNOSIS — M8589 Other specified disorders of bone density and structure, multiple sites: Secondary | ICD-10-CM

## 2023-10-10 DIAGNOSIS — D709 Neutropenia, unspecified: Secondary | ICD-10-CM | POA: Insufficient documentation

## 2023-10-10 DIAGNOSIS — M8580 Other specified disorders of bone density and structure, unspecified site: Secondary | ICD-10-CM | POA: Insufficient documentation

## 2023-10-10 DIAGNOSIS — Z923 Personal history of irradiation: Secondary | ICD-10-CM | POA: Diagnosis not present

## 2023-10-10 DIAGNOSIS — Z95828 Presence of other vascular implants and grafts: Secondary | ICD-10-CM

## 2023-10-10 LAB — CBC WITH DIFFERENTIAL (CANCER CENTER ONLY)
Abs Immature Granulocytes: 0 10*3/uL (ref 0.00–0.07)
Basophils Absolute: 0 10*3/uL (ref 0.0–0.1)
Basophils Relative: 0 %
Eosinophils Absolute: 0 10*3/uL (ref 0.0–0.5)
Eosinophils Relative: 0 %
HCT: 35.9 % — ABNORMAL LOW (ref 36.0–46.0)
Hemoglobin: 11.3 g/dL — ABNORMAL LOW (ref 12.0–15.0)
Immature Granulocytes: 0 %
Lymphocytes Relative: 40 %
Lymphs Abs: 1 10*3/uL (ref 0.7–4.0)
MCH: 28.8 pg (ref 26.0–34.0)
MCHC: 31.5 g/dL (ref 30.0–36.0)
MCV: 91.3 fL (ref 80.0–100.0)
Monocytes Absolute: 0.3 10*3/uL (ref 0.1–1.0)
Monocytes Relative: 11 %
Neutro Abs: 1.2 10*3/uL — ABNORMAL LOW (ref 1.7–7.7)
Neutrophils Relative %: 49 %
Platelet Count: 180 10*3/uL (ref 150–400)
RBC: 3.93 MIL/uL (ref 3.87–5.11)
RDW: 13.2 % (ref 11.5–15.5)
WBC Count: 2.5 10*3/uL — ABNORMAL LOW (ref 4.0–10.5)
nRBC: 0 % (ref 0.0–0.2)

## 2023-10-10 LAB — CMP (CANCER CENTER ONLY)
ALT: 26 U/L (ref 0–44)
AST: 29 U/L (ref 15–41)
Albumin: 4.6 g/dL (ref 3.5–5.0)
Alkaline Phosphatase: 40 U/L (ref 38–126)
Anion gap: 5 (ref 5–15)
BUN: 14 mg/dL (ref 6–20)
CO2: 25 mmol/L (ref 22–32)
Calcium: 9.4 mg/dL (ref 8.9–10.3)
Chloride: 107 mmol/L (ref 98–111)
Creatinine: 0.71 mg/dL (ref 0.44–1.00)
GFR, Estimated: >60 mL/min (ref >60)
Glucose, Bld: 99 mg/dL (ref 70–99)
Potassium: 4.2 mmol/L (ref 3.5–5.1)
Sodium: 137 mmol/L (ref 135–145)
Total Bilirubin: 0.6 mg/dL (ref 0.3–1.2)
Total Protein: 7.7 g/dL (ref 6.5–8.1)

## 2023-10-10 MED ORDER — SODIUM CHLORIDE 0.9% FLUSH
10.0000 mL | Freq: Once | INTRAVENOUS | Status: AC
  Administered 2023-10-10: 10 mL via INTRAVENOUS
  Filled 2023-10-10: qty 10

## 2023-10-10 MED ORDER — HEPARIN SOD (PORK) LOCK FLUSH 100 UNIT/ML IV SOLN
500.0000 [IU] | Freq: Once | INTRAVENOUS | Status: AC
  Administered 2023-10-10: 500 [IU] via INTRAVENOUS
  Filled 2023-10-10: qty 5

## 2023-10-10 NOTE — Progress Notes (Signed)
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  -s/p neoadjuvant pembrolizumab/carboplatin Q3 weeks / weekly Taxol- followed by ddAC x4. -s/p left upper outer lumpectomy with sentinel lymph node biopsy  ypTis ypN0.   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 Tonna Boehringer. Most recent results were reviewed with patient.   Chemotherapy-induced neuropathy (HCC) Continue gabapentin 600 mg twice daily.   Osteopenia Continue calcium and vitamin D  supplementation.  Hives/allergic reaction after  zometa. Switched to prolia 60mg  Q6 months.   Prolia Q6 months-  next due Jan 2025  Aromatase inhibitor use Continue calcium supplementation and Vitamin D supplementation.   Port-A-Cath in place Recommend patient to continue port flush every 8 weeks  Shared decision was to keep port for 2 years after surgery- till Dec 2024 She will see Dr. Tonna Boehringer for medi port removal    Orders Placed This Encounter  Procedures   Cancer antigen 15-3    Standing Status:   Future    Standing Expiration Date:   10/09/2024   Cancer antigen 27.29    Standing Status:   Future    Standing Expiration Date:   10/09/2024   CBC with Differential (Cancer Center Only)     Standing Status:   Future    Standing Expiration Date:   10/09/2024   CMP (Cancer Center only)    Standing Status:   Future    Standing Expiration Date:   10/09/2024   Follow-up in 3 months, lab prior to MD.   All questions were answered. The patient knows to call the clinic with any problems, questions or concerns.  [name], MD, [name] [facility] Hematology Oncology 10/10/2023     HISTORY OF PRESENTING ILLNESS:   Peggy Bates is a  57 y.o.  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 Oncology history summary listed as below Oncology History Overview Note  # Pathology dated April 07, 2021 from [facility] in [place]:  Ultrasound-guided core biopsy. Nuclear grade 3, high-grade. P53: 70% staining. Ki-67: 70% staining.  ER negative, PR negative, HER2 negative. Mitotic rate of 13 mitoses per high-power field.  The patient brought a copy of her imaging studies with her from [place] and these have been independently reviewed.  Screening mammogram dated March 30, 2021 showed an asymmetric nodular density in the superior lateral aspect of the left breast. The right breast was unremarkable BI-RADS-0.  Ultrasound examination of the left breast showed a 1.1 cm round, nonparallel hypoechoic mass with angular margins. BI-RADS-4.  Family history of breast cancer and a sister in her late 29s, early 78s. No history of genetic testing available at this time.   Breast cancer in female [facility])  04/21/2021 Initial Diagnosis   Left breast triple negative cancer, residual ER positive DCIS  -  03/30/2021, screening mammogram with 3D 2 nodular areas of asymmetric density demonstrated within the superior lateral aspect of the left breast middle third depth.  Finding was best appreciated on 3D tomosynthesis imaging. Targeted ultrasound showed left upper outer breast 1:30 position 1.1 cm round hypoechoic mass with angular margins. 04/21/2021  biopsy  of the breast mass Pathology showed infiltrating ductal carcinoma, grade 3, Ki-67 70%, ER negative, PR negative, HER2 negative,    Case was discussed on Tumor board.  Pathology reports and mammogram/ultrasound were done at outside facility and results were reviewed and discussed with patient and her husband.  LN status was not mentioned on her [place]. Recommend additional images with mammogram and MRI, neoadjuvant chemotherapy    05/12/2021 diagnostic mammogram of left breast showed 2.1 cm biopsy-proven malignancy in the lateral left breast at 2:00, 4 cm from nipple.  Directly adjacent cystic mass, 1.5 cm in size, consistent with biopsy hematoma.  1 abnormal and 1 borderline abnormal left axillary lymph node  05/18/2021 bilateral breast MRI with and without contrast showed 2.5 x 3 x 2.5 cm hematoma containing biopsy clip artifact within the upper outer left breast.  1.6 cm nodular non-mass-like enhancement 2.5 cm posterior/superior to the biopsy clip may represent biopsy-proven malignancy or additional areas of malignancy.  Single abnormal appearing left axillary lymph node with eccentric cortical thickening.  No MRI evidence of the right breast malignancy.   05/20/2021 Slide consultation of her left breast biopsy from outside institution - invasive mammary carcinoma, no special type, grade 3, DCIS and LVI not identified. - ER negative, PR negative, HER2 IHC negative. Ki 67 70-80%     05/20/2021 ultrasound-guided left axillary lymph node was negative for malignancy.  06/02/2021 MRI left breast biopsy of the upper outer quadrant - pathology shows high grade DCIS, ER 30%+    04/29/2021 Cancer Staging   Staging form: Breast, AJCC 8th Edition - Clinical stage from 04/29/2021: Stage IIB (cT2, cN0, cM0, G3, ER-, PR-, HER2-) - Signed by [name], MD on 06/04/2021 Stage prefix: Initial diagnosis Histologic grading system: 3 grade system   05/07/2021 Procedure   Mediport was placed by Dr. Lemar Livings   05/25/2021 Genetic  Testing   Negative genetic testing. No pathogenic variants identified on the [facility] Multi-Cancer Panel +RNA. The report date is 05/25/2021.  The Multi-Cancer Panel + RNA offered by [facility] includes sequencing and/or deletion duplication testing of the following 84 genes: AIP, ALK, APC, ATM, AXIN2,BAP1,  BARD1, BLM, BMPR1A, BRCA1, BRCA2, BRIP1, CASR, CDC73, CDH1, CDK4, CDKN1B, CDKN1C, CDKN2A (p14ARF), CDKN2A (p16INK4a), CEBPA, CHEK2, CTNNA1, DICER1, DIS3L2, EGFR (c.2369C>T, p.Thr790Met variant only), EPCAM (Deletion/duplication testing only), FH, FLCN, GATA2, GPC3, GREM1 (Promoter region deletion/duplication testing only), HOXB13 (c.251G>A, p.Gly84Glu), HRAS, KIT, MAX, MEN1, MET, MITF (c.952G>A, p.Glu318Lys variant only), MLH1, MSH2, MSH3, MSH6, MUTYH, NBN, NF1, NF2, NTHL1, PALB2, PDGFRA, PHOX2B, PMS2, POLD1, POLE, POT1, PRKAR1A, PTCH1, PTEN, RAD50, RAD51C, RAD51D, RB1, RECQL4, RET, RUNX1, SDHAF2, SDHA (sequence changes only), SDHB, SDHC, SDHD, SMAD4, SMARCA4, SMARCB1, SMARCE1, STK11, SUFU, TERC, TERT, TMEM127, TP53, TSC1, TSC2, VHL, WRN and WT1.   06/08/2021 - 10/28/2021 Chemotherapy   pembrolizumab/carboplatin Q 3 weeks / weekly Taxol followed by ddAC x4 with G-CSF support       06/08/2021 - 10/28/2021 Chemotherapy   Patient is on Treatment Plan : BREAST Pembrolizumab + Carboplatin weekly + Paclitaxel D1,8,15 q21d X 4 cycles /  AC q21d x 4 cycles     08/30/2021 Imaging   MRI breast showed no abnormal enhancement remains in the  left breast. Complete response to chemotherapy is identified. No MRI evidence of malignancy in either breast.   11/15/2021 Imaging   bilateral MRI breast showed no abnormal enhancement is identified in bilateral breast.   12/10/2021 Surgery   patient underwent left upper outer lumpectomy with sentinel lymph node biopsy.  Residual DCIS, high-grade, 4 sentinel lymph nodes were harvested and all negative for malignancy.  Margins are negative for DCIS, closest margin is greater  than 5 mm. ypTis ypN0. Residual DCIS ER 11-50%   01/04/2022 - 02/10/2022 Radiation Therapy   adjuvant breast radiation   03/11/2022 -  Anti-estrogen oral therapy   Started on letrozole 2.5 mg daily   05/12/2022 Mammogram   Bilateral diagnostic mammogram/ultrasound showed No mammographic evidence of malignancy   05/12/2022 Imaging   Bone density showed osteopenia FRAX* RESULTS:  10-year Probability of Fracture1 Major Osteoporotic Fracture2 Hip Fracture 2.2% 0.1%      INTERVAL HISTORY Kalleigh Barbarito is a 57 y.o. female who has above history reviewed by me today presents for follow up visit for management of left triple negative breast cancer  Patient has been on letrozole 2.5 mg daily.   + arthralgia and stiffness, hot flash. She has no new breast concerns except intermittent breast pain at previous surgery site.   . Review of Systems  Constitutional:  Positive for fatigue. Negative for appetite change, chills and fever.  HENT:   Negative for hearing loss and voice change.   Eyes:  Negative for eye problems.  Respiratory:  Negative for chest tightness and cough.   Cardiovascular:  Negative for chest pain.  Gastrointestinal:  Negative for abdominal distention, abdominal pain and blood in stool.  Endocrine: Positive for hot flashes.  Genitourinary:  Negative for difficulty urinating and frequency.   Musculoskeletal:  Positive for arthralgias.  Skin:  Negative for itching and rash.  Neurological:  Positive for numbness. Negative for extremity weakness.  Hematological:  Negative for adenopathy.  Psychiatric/Behavioral:  Negative for confusion.     MEDICAL HISTORY:  Past Medical History:  Diagnosis Date   Arthritis    knees   Breast cancer (HCC)    2022   COVID-19 12/2020   Depression    Family history of breast cancer    Goiter    Malignant neoplasm of left female breast (HCC) 04/07/2021   Personal history of chemotherapy    Personal history of radiation therapy      SURGICAL HISTORY: Past Surgical History:  Procedure Laterality Date   ADJACENT TISSUE TRANSFER/TISSUE REARRANGEMENT Left 12/10/2021   Procedure: ADJACENT TISSUE TRANSFER/TISSUE REARRANGEMENT;  Surgeon: Earline Mayotte, MD;  Location: [facility] ORS;  Service: General;  Laterality: Left;   AXILLARY SENTINEL NODE BIOPSY Left 12/10/2021   Procedure: AXILLARY SENTINEL NODE BIOPSY;  Surgeon: Earline Mayotte, MD;  Location: [facility] ORS;  Service: General;  Laterality: Left;   BREAST BIOPSY Left 04/07/2021   [facility] in [place]:u/s bx triple neg   BREAST BIOPSY Left 05/20/2021   [place] Axilla Bx, hydro 3 marker, neg   BREAST LUMPECTOMY WITH NEEDLE LOCALIZATION Left 12/10/2021   Procedure: BREAST LUMPECTOMY WITH NEEDLE LOCALIZATION, PECTORAL BLOCK;  Surgeon: Earline Mayotte, MD;  Location: [facility] ORS;  Service: General;  Laterality: Left;   INCISION AND DRAINAGE ABSCESS Left 09/16/2022   Procedure: INCISION AND DRAINAGE ABSCESS;  Surgeon: Earline Mayotte, MD;  Location: [facility] ORS;  Service: General;  Laterality: Left;  seroma (not abscess)   KNEE ARTHROSCOPY Right 2008   PORTACATH PLACEMENT Right  05/07/2021   Procedure: INSERTION PORT-A-CATH;  Surgeon: Earline Mayotte, MD;  Location: [facility] ORS;  Service: General;  Laterality: Right;   TOOTH EXTRACTION     TRANSCERVICAL UTERINE FIBROID(S) ABLATION  2010    SOCIAL HISTORY: Social History   Socioeconomic History   Marital status: Married    Spouse name: Not on file   Number of children: Not on file   Years of education: Not on file   Highest education level: Not on file  Occupational History   Not on file  Tobacco Use   Smoking status: Never   Smokeless tobacco: Never  Vaping Use   Vaping status: Never Used  Substance and Sexual Activity   Alcohol use: Never   Drug use: Never   Sexual activity: Yes  Other Topics Concern   Not on file  Social History Narrative   ** Merged History Encounter **       Social  Determinants of Health   Financial Resource Strain: Not on file  Food Insecurity: Not on file  Transportation Needs: Not on file  Physical Activity: Not on file  Stress: Not on file  Social Connections: Not on file  Intimate Partner Violence: Not on file    FAMILY HISTORY: Family History  Problem Relation Age of Onset   Breast cancer Paternal Grandmother    Breast cancer Sister        dx 30s, recurrence x3   Diabetes Sister    Diabetes Father    Throat cancer Maternal Grandmother     ALLERGIES:  is allergic to other.  MEDICATIONS:  Current Outpatient Medications  Medication Sig Dispense Refill   acetaminophen (TYLENOL) 650 MG CR tablet Take 1,300 mg by mouth See admin instructions. Take 1300 mg in the morning may take a second 1300 mg dose during the day as needed for pain     calcium carbonate (OS-CAL) 600 MG TABS tablet Take 2 tablets by mouth daily with breakfast.     cholecalciferol (VITAMIN D) 25 MCG (1000 UNIT) tablet Take 1,000 Units by mouth daily.     diclofenac Sodium (VOLTAREN) 1 % GEL Apply 1 application  topically daily as needed.     gabapentin (NEURONTIN) 600 MG tablet Take 600 mg by mouth every morning.     HYDROcodone-acetaminophen (NORCO/VICODIN) 5-325 MG tablet      letrozole (FEMARA) 2.5 MG tablet Take 1 tablet (2.5 mg total) by mouth daily. 30 tablet 6   lidocaine-prilocaine (EMLA) cream Apply 1 Application topically as needed. Apply small amount of cream over port a cath site 1-2 hours prior to port being accessed. 30 g 2   Multiple Vitamins-Minerals (MULTIVITAMIN ADULT) CHEW Chew by mouth.     phentermine (ADIPEX-P) 37.5 MG tablet Take 18.75 mg by mouth daily.     No current facility-administered medications for this visit.   Facility-Administered Medications Ordered in Other Visits  Medication Dose Route Frequency Provider Last Rate Last Admin   heparin lock flush 100 unit/mL  500 Units Intravenous Once [name], MD       heparin lock flush 100  unit/mL  500 Units Intravenous Once [name], MD       sodium chloride flush (NS) 0.9 % injection 10 mL  10 mL Intravenous Once [name], MD       sodium chloride flush (NS) 0.9 % injection 10 mL  10 mL Intravenous PRN [name], MD         PHYSICAL EXAMINATION: ECOG PERFORMANCE STATUS: 0 -  Asymptomatic Vitals:   10/10/23 1021  BP: 120/83  Pulse: 95  Resp: 18  Temp: (!) 97.2 F (36.2 C)   Filed Weights   10/10/23 1021  Weight: 201 lb 8 oz (91.4 kg)    Physical Exam Constitutional:      General: She is not in acute distress. HENT:     Head: Normocephalic and atraumatic.  Eyes:     General: No scleral icterus. Cardiovascular:     Rate and Rhythm: Normal rate and regular rhythm.     Heart sounds: Normal heart sounds.  Pulmonary:     Effort: Pulmonary effort is normal. No respiratory distress.     Breath sounds: No wheezing.  Abdominal:     General: Bowel sounds are normal. There is no distension.     Palpations: Abdomen is soft.  Musculoskeletal:        General: No deformity. Normal range of motion.     Cervical back: Normal range of motion and neck supple.  Skin:    General: Skin is warm and dry.     Findings: No erythema or rash.  Neurological:     Mental Status: She is alert and oriented to person, place, and time. Mental status is at baseline.     Cranial Nerves: No cranial nerve deficit.     Coordination: Coordination normal.  Psychiatric:        Mood and Affect: Mood normal.    Left breast no palpable mass, no palpable axillary lymphadenopathy  LABORATORY DATA:  I have reviewed the data as listed    Latest Ref Rng & Units 10/10/2023    9:54 AM 07/04/2023    9:48 AM 04/05/2023    9:57 AM  CBC  WBC 4.0 - 10.5 K/uL 2.5  2.5  2.9   Hemoglobin 12.0 - 15.0 g/dL 16.1  09.6  04.5   Hematocrit 36.0 - 46.0 % 35.9  37.5  37.1   Platelets 150 - 400 K/uL 180  178  172       Latest Ref Rng & Units 10/10/2023    9:54 AM 07/04/2023    9:48 AM 04/05/2023    9:57 AM   CMP  Glucose 70 - 99 mg/dL 99  92  96   BUN 6 - 20 mg/dL 14  23  20    Creatinine 0.44 - 1.00 mg/dL 4.09  8.11  9.14   Sodium 135 - 145 mmol/L 137  137  138   Potassium 3.5 - 5.1 mmol/L 4.2  3.7  4.0   Chloride 98 - 111 mmol/L 107  104  106   CO2 22 - 32 mmol/L 25  25  26    Calcium 8.9 - 10.3 mg/dL 9.4  78.2  9.8   Total Protein 6.5 - 8.1 g/dL 7.7  8.0  7.8   Total Bilirubin 0.3 - 1.2 mg/dL 0.6  0.5  0.6   Alkaline Phos 38 - 126 U/L 40  43  42   AST 15 - 41 U/L 29  25  29    ALT 0 - 44 U/L 26  21  28      RADIOGRAPHIC STUDIES: I have personally reviewed the radiological images as listed and agreed with the findings in the report. No results found.

## 2023-10-10 NOTE — Telephone Encounter (Signed)
Order faxed to Dr. Geoffery Lyons office requesting port removal in late Dec.

## 2023-10-10 NOTE — Assessment & Plan Note (Addendum)
Recommend patient to continue port flush every 8 weeks  Shared decision was to keep port for 2 years after surgery- till Dec 2024 She will see Dr. Tonna Boehringer for [facility] port removal

## 2023-10-10 NOTE — Assessment & Plan Note (Signed)
Continue calcium supplementation and Vitamin D supplementation.

## 2023-10-10 NOTE — Progress Notes (Signed)
Pt here for follow up.

## 2023-10-10 NOTE — Assessment & Plan Note (Addendum)
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  -s/p neoadjuvant pembrolizumab/carboplatin Q3 weeks / weekly Taxol- followed by ddAC x4. -s/p left upper outer lumpectomy with sentinel lymph node biopsy  ypTis ypN0.   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 Tonna Boehringer. Most recent results were reviewed with patient.

## 2023-10-10 NOTE — Assessment & Plan Note (Addendum)
Continue calcium and vitamin D  supplementation.  Hives/allergic reaction after  zometa. Switched to prolia 60mg  Q6 months.   Prolia Q6 months-  next due Jan 2025

## 2023-10-10 NOTE — Assessment & Plan Note (Signed)
 Continue gabapentin 600 mg twice daily. 

## 2023-10-11 LAB — CANCER ANTIGEN 15-3: CA 15-3: 6 U/mL (ref 0.0–25.0)

## 2023-10-11 LAB — CANCER ANTIGEN 27.29: CA 27.29: <9 U/mL (ref 0.0–38.6)

## 2023-10-16 ENCOUNTER — Telehealth: Payer: Self-pay | Admitting: *Deleted

## 2023-10-16 NOTE — Telephone Encounter (Signed)
Patient called reporting that her port is being removed on 12/06/23.

## 2023-10-30 ENCOUNTER — Inpatient Hospital Stay

## 2023-11-21 ENCOUNTER — Encounter

## 2023-12-05 ENCOUNTER — Inpatient Hospital Stay

## 2024-01-02 ENCOUNTER — Encounter: Payer: Self-pay | Admitting: Oncology

## 2024-01-05 ENCOUNTER — Telehealth: Payer: Self-pay | Admitting: *Deleted

## 2024-01-05 NOTE — Telephone Encounter (Signed)
Patient called in saying that she was trying to do all of her information prior to her visit and when she was putting the information in said she did not have any insurance.  I called her back and talk to her and she says that she still does have insurance and that she will just straighten it out whenever she comes in registers on January 21

## 2024-01-09 ENCOUNTER — Inpatient Hospital Stay

## 2024-01-09 ENCOUNTER — Inpatient Hospital Stay (HOSPITAL_BASED_OUTPATIENT_CLINIC_OR_DEPARTMENT_OTHER): Admitting: Oncology

## 2024-01-09 ENCOUNTER — Inpatient Hospital Stay: Attending: Oncology

## 2024-01-09 ENCOUNTER — Encounter: Payer: Self-pay | Admitting: Oncology

## 2024-01-09 ENCOUNTER — Other Ambulatory Visit: Payer: Self-pay

## 2024-01-09 VITALS — BP 119/74 | HR 83 | Temp 97.1°F | Resp 18 | Wt 211.7 lb

## 2024-01-09 DIAGNOSIS — Z923 Personal history of irradiation: Secondary | ICD-10-CM | POA: Diagnosis not present

## 2024-01-09 DIAGNOSIS — T451X5D Adverse effect of antineoplastic and immunosuppressive drugs, subsequent encounter: Secondary | ICD-10-CM | POA: Diagnosis not present

## 2024-01-09 DIAGNOSIS — M8580 Other specified disorders of bone density and structure, unspecified site: Secondary | ICD-10-CM

## 2024-01-09 DIAGNOSIS — G620 Drug-induced polyneuropathy: Secondary | ICD-10-CM | POA: Diagnosis not present

## 2024-01-09 DIAGNOSIS — R5383 Other fatigue: Secondary | ICD-10-CM | POA: Insufficient documentation

## 2024-01-09 DIAGNOSIS — M2550 Pain in unspecified joint: Secondary | ICD-10-CM | POA: Insufficient documentation

## 2024-01-09 DIAGNOSIS — Z79811 Long term (current) use of aromatase inhibitors: Secondary | ICD-10-CM | POA: Insufficient documentation

## 2024-01-09 DIAGNOSIS — Z803 Family history of malignant neoplasm of breast: Secondary | ICD-10-CM | POA: Diagnosis not present

## 2024-01-09 DIAGNOSIS — Z171 Estrogen receptor negative status [ER-]: Secondary | ICD-10-CM | POA: Insufficient documentation

## 2024-01-09 DIAGNOSIS — Z79899 Other long term (current) drug therapy: Secondary | ICD-10-CM | POA: Diagnosis not present

## 2024-01-09 DIAGNOSIS — C50412 Malignant neoplasm of upper-outer quadrant of left female breast: Secondary | ICD-10-CM | POA: Insufficient documentation

## 2024-01-09 DIAGNOSIS — Z9221 Personal history of antineoplastic chemotherapy: Secondary | ICD-10-CM | POA: Insufficient documentation

## 2024-01-09 DIAGNOSIS — T451X5A Adverse effect of antineoplastic and immunosuppressive drugs, initial encounter: Secondary | ICD-10-CM

## 2024-01-09 DIAGNOSIS — M8589 Other specified disorders of bone density and structure, multiple sites: Secondary | ICD-10-CM

## 2024-01-09 LAB — CBC WITH DIFFERENTIAL (CANCER CENTER ONLY)
Abs Immature Granulocytes: 0.01 10*3/uL (ref 0.00–0.07)
Basophils Absolute: 0 10*3/uL (ref 0.0–0.1)
Basophils Relative: 0 %
Eosinophils Absolute: 0 10*3/uL (ref 0.0–0.5)
Eosinophils Relative: 1 %
HCT: 38.3 % (ref 36.0–46.0)
Hemoglobin: 12.2 g/dL (ref 12.0–15.0)
Immature Granulocytes: 0 %
Lymphocytes Relative: 34 %
Lymphs Abs: 1.3 10*3/uL (ref 0.7–4.0)
MCH: 29.3 pg (ref 26.0–34.0)
MCHC: 31.9 g/dL (ref 30.0–36.0)
MCV: 91.8 fL (ref 80.0–100.0)
Monocytes Absolute: 0.3 10*3/uL (ref 0.1–1.0)
Monocytes Relative: 9 %
Neutro Abs: 2.3 10*3/uL (ref 1.7–7.7)
Neutrophils Relative %: 56 %
Platelet Count: 222 10*3/uL (ref 150–400)
RBC: 4.17 MIL/uL (ref 3.87–5.11)
RDW: 12.3 % (ref 11.5–15.5)
WBC Count: 4 10*3/uL (ref 4.0–10.5)
nRBC: 0 % (ref 0.0–0.2)

## 2024-01-09 LAB — CMP (CANCER CENTER ONLY)
ALT: 40 U/L (ref 0–44)
AST: 27 U/L (ref 15–41)
Albumin: 4.7 g/dL (ref 3.5–5.0)
Alkaline Phosphatase: 63 U/L (ref 38–126)
Anion gap: 8 (ref 5–15)
BUN: 25 mg/dL — ABNORMAL HIGH (ref 6–20)
CO2: 28 mmol/L (ref 22–32)
Calcium: 10.8 mg/dL — ABNORMAL HIGH (ref 8.9–10.3)
Chloride: 102 mmol/L (ref 98–111)
Creatinine: 0.85 mg/dL (ref 0.44–1.00)
GFR, Estimated: >60 mL/min (ref >60)
Glucose, Bld: 97 mg/dL (ref 70–99)
Potassium: 4.4 mmol/L (ref 3.5–5.1)
Sodium: 138 mmol/L (ref 135–145)
Total Bilirubin: 0.5 mg/dL (ref 0.0–1.2)
Total Protein: 8.3 g/dL — ABNORMAL HIGH (ref 6.5–8.1)

## 2024-01-09 MED ORDER — DENOSUMAB 60 MG/ML ~~LOC~~ SOSY
60.0000 mg | PREFILLED_SYRINGE | Freq: Once | SUBCUTANEOUS | Status: AC
  Administered 2024-01-09: 60 mg via SUBCUTANEOUS
  Filled 2024-01-09: qty 1

## 2024-01-09 NOTE — Assessment & Plan Note (Signed)
Continue calcium supplementation and Vitamin D supplementation.

## 2024-01-09 NOTE — Progress Notes (Signed)
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  -s/p neoadjuvant pembrolizumab/carboplatin Q3 weeks / weekly Taxol- followed by ddAC x4. -s/p left upper outer lumpectomy with sentinel lymph node biopsy  ypTis ypN0.   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 Tonna Boehringer orders.   Aromatase inhibitor use Continue calcium supplementation and Vitamin D supplementation.   Chemotherapy-induced neuropathy (HCC) Continue gabapentin 600 mg twice daily.   Osteopenia Continue calcium and vitamin D  supplementation.  Hives/allergic reaction after  zometa. Switched to prolia 60mg  Q6 months.   Prolia Q6 months-  next due Jan 2025  Hypercalcemia Mild, possibly related to recent COVID/treatment side effect, dehydration.  Repeat calcium in 4 weeks. Check PTH   Orders Placed This Encounter  Procedures   Parathyroid hormone, intact (no Ca)    Standing Status:   Future    Number of Occurrences:   1    Expected Date:   01/09/2024    Expiration Date:   01/08/2025   CBC with Differential (Cancer Center Only)    Standing Status:   Future    Expected Date:   04/08/2024    Expiration Date:   01/08/2025   CMP (Cancer Center only)    Standing Status:   Future     Expected Date:   04/08/2024    Expiration Date:   01/08/2025   Cancer antigen 27.29    Standing Status:   Future    Expected Date:   04/08/2024    Expiration Date:   01/08/2025   Cancer antigen 15-3    Standing Status:   Future    Expected Date:   04/08/2024    Expiration Date:   01/08/2025   Follow-up in 3 months, lab  MD.   All questions were answered. The patient knows to call the clinic with any problems, questions or concerns.  [name], MD, [name] [facility] Hematology Oncology 01/09/2024     HISTORY OF PRESENTING ILLNESS:   Peggy Bates is a  58 y.o.  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 Oncology history summary listed as below Oncology History Overview Note  # Pathology dated April 07, 2021 from [facility] in [place]:  Ultrasound-guided core biopsy. Nuclear grade 3, high-grade. P53: 70% staining. Ki-67: 70% staining.  ER negative, PR negative, HER2 negative. Mitotic rate of 13 mitoses per high-power field.  The patient brought a copy of her imaging studies with her from [place] and these have been independently reviewed.  Screening mammogram dated March 30, 2021 showed an asymmetric nodular density in the superior lateral aspect of the left breast. The right breast was unremarkable BI-RADS-0.  Ultrasound examination of the left breast showed a 1.1 cm round, nonparallel hypoechoic mass with angular margins. BI-RADS-4.  Family history of breast cancer  and a sister in her late 25s, early 61s. No history of genetic testing available at this time.   Breast cancer in female [facility])  04/21/2021 Initial Diagnosis   Left breast triple negative cancer, residual ER positive DCIS  -03/30/2021, screening mammogram with 3D 2 nodular areas of asymmetric density demonstrated within the superior lateral aspect of the left breast middle third depth.  Finding was best appreciated on 3D tomosynthesis imaging. Targeted  ultrasound showed left upper outer breast 1:30 position 1.1 cm round hypoechoic mass with angular margins. 04/21/2021  biopsy of the breast mass Pathology showed infiltrating ductal carcinoma, grade 3, Ki-67 70%, ER negative, PR negative, HER2 negative,    Case was discussed on Tumor board.  Pathology reports and mammogram/ultrasound were done at outside facility and results were reviewed and discussed with patient and her husband.  LN status was not mentioned on her [place]. Recommend additional images with mammogram and MRI, neoadjuvant chemotherapy    05/12/2021 diagnostic mammogram of left breast showed 2.1 cm biopsy-proven malignancy in the lateral left breast at 2:00, 4 cm from nipple.  Directly adjacent cystic mass, 1.5 cm in size, consistent with biopsy hematoma.  1 abnormal and 1 borderline abnormal left axillary lymph node  05/18/2021 bilateral breast MRI with and without contrast showed 2.5 x 3 x 2.5 cm hematoma containing biopsy clip artifact within the upper outer left breast.  1.6 cm nodular non-mass-like enhancement 2.5 cm posterior/superior to the biopsy clip may represent biopsy-proven malignancy or additional areas of malignancy.  Single abnormal appearing left axillary lymph node with eccentric cortical thickening.  No MRI evidence of the right breast malignancy.   05/20/2021 Slide consultation of her left breast biopsy from outside institution - invasive mammary carcinoma, no special type, grade 3, DCIS and LVI not identified. - ER negative, PR negative, HER2 IHC negative. Ki 67 70-80%     05/20/2021 ultrasound-guided left axillary lymph node was negative for malignancy.  06/02/2021 MRI left breast biopsy of the upper outer quadrant - pathology shows high grade DCIS, ER 30%+    04/29/2021 Cancer Staging   Staging form: Breast, AJCC 8th Edition - Clinical stage from 04/29/2021: Stage IIB (cT2, cN0, cM0, G3, ER-, PR-, HER2-) - Signed by [name], MD on 06/04/2021 Stage prefix: Initial  diagnosis Histologic grading system: 3 grade system   05/07/2021 Procedure   Mediport was placed by Dr. Lemar Livings   05/25/2021 Genetic Testing   Negative genetic testing. No pathogenic variants identified on the [facility] Multi-Cancer Panel +RNA. The report date is 05/25/2021.  The Multi-Cancer Panel + RNA offered by [facility] includes sequencing and/or deletion duplication testing of the following 84 genes: AIP, ALK, APC, ATM, AXIN2,BAP1,  BARD1, BLM, BMPR1A, BRCA1, BRCA2, BRIP1, CASR, CDC73, CDH1, CDK4, CDKN1B, CDKN1C, CDKN2A (p14ARF), CDKN2A (p16INK4a), CEBPA, CHEK2, CTNNA1, DICER1, DIS3L2, EGFR (c.2369C>T, p.Thr790Met variant only), EPCAM (Deletion/duplication testing only), FH, FLCN, GATA2, GPC3, GREM1 (Promoter region deletion/duplication testing only), HOXB13 (c.251G>A, p.Gly84Glu), HRAS, KIT, MAX, MEN1, MET, MITF (c.952G>A, p.Glu318Lys variant only), MLH1, MSH2, MSH3, MSH6, MUTYH, NBN, NF1, NF2, NTHL1, PALB2, PDGFRA, PHOX2B, PMS2, POLD1, POLE, POT1, PRKAR1A, PTCH1, PTEN, RAD50, RAD51C, RAD51D, RB1, RECQL4, RET, RUNX1, SDHAF2, SDHA (sequence changes only), SDHB, SDHC, SDHD, SMAD4, SMARCA4, SMARCB1, SMARCE1, STK11, SUFU, TERC, TERT, TMEM127, TP53, TSC1, TSC2, VHL, WRN and WT1.   06/08/2021 - 10/28/2021 Chemotherapy   pembrolizumab/carboplatin Q 3 weeks / weekly Taxol followed by ddAC x4 with G-CSF support       06/08/2021 - 10/28/2021 Chemotherapy   Patient  is on Treatment Plan : BREAST Pembrolizumab + Carboplatin weekly + Paclitaxel D1,8,15 q21d X 4 cycles /  AC q21d x 4 cycles     08/30/2021 Imaging   MRI breast showed no abnormal enhancement remains in the left breast. Complete response to chemotherapy is identified. No MRI evidence of malignancy in either breast.   11/15/2021 Imaging   bilateral MRI breast showed no abnormal enhancement is identified in bilateral breast.   12/10/2021 Surgery   patient underwent left upper outer lumpectomy with sentinel lymph node biopsy.  Residual DCIS,  high-grade, 4 sentinel lymph nodes were harvested and all negative for malignancy.  Margins are negative for DCIS, closest margin is greater than 5 mm. ypTis ypN0. Residual DCIS ER 11-50%   01/04/2022 - 02/10/2022 Radiation Therapy   adjuvant breast radiation   03/11/2022 -  Anti-estrogen oral therapy   Started on letrozole 2.5 mg daily   05/12/2022 Mammogram   Bilateral diagnostic mammogram/ultrasound showed No mammographic evidence of malignancy   05/12/2022 Imaging   Bone density showed osteopenia FRAX* RESULTS:  10-year Probability of Fracture1 Major Osteoporotic Fracture2 Hip Fracture 2.2% 0.1%      INTERVAL HISTORY Peggy Bates is a 58 y.o. female who has above history reviewed by me today presents for follow up visit for management of left triple negative breast cancer  Patient has been on letrozole 2.5 mg daily.   + arthralgia and stiffness, hot flash. She has no new breast concerns except intermittent breast pain at previous surgery site.  Recent COVID infection 1 week ago and was treated with Paxlovid  . Review of Systems  Constitutional:  Positive for fatigue. Negative for appetite change, chills and fever.  HENT:   Negative for hearing loss and voice change.   Eyes:  Negative for eye problems.  Respiratory:  Negative for chest tightness and cough.   Cardiovascular:  Negative for chest pain.  Gastrointestinal:  Negative for abdominal distention, abdominal pain and blood in stool.  Endocrine: Positive for hot flashes.  Genitourinary:  Negative for difficulty urinating and frequency.   Musculoskeletal:  Positive for arthralgias.  Skin:  Negative for itching and rash.  Neurological:  Positive for numbness. Negative for extremity weakness.  Hematological:  Negative for adenopathy.  Psychiatric/Behavioral:  Negative for confusion.     MEDICAL HISTORY:  Past Medical History:  Diagnosis Date   Arthritis    knees   Breast cancer (HCC)    2022   COVID-19  12/2020   Depression    Family history of breast cancer    Goiter    Malignant neoplasm of left female breast (HCC) 04/07/2021   Personal history of chemotherapy    Personal history of radiation therapy     SURGICAL HISTORY: Past Surgical History:  Procedure Laterality Date   ADJACENT TISSUE TRANSFER/TISSUE REARRANGEMENT Left 12/10/2021   Procedure: ADJACENT TISSUE TRANSFER/TISSUE REARRANGEMENT;  Surgeon: Earline Mayotte, MD;  Location: [facility] ORS;  Service: General;  Laterality: Left;   AXILLARY SENTINEL NODE BIOPSY Left 12/10/2021   Procedure: AXILLARY SENTINEL NODE BIOPSY;  Surgeon: Earline Mayotte, MD;  Location: [facility] ORS;  Service: General;  Laterality: Left;   BREAST BIOPSY Left 04/07/2021   [facility] in [place]:u/s bx triple neg   BREAST BIOPSY Left 05/20/2021   [place] Axilla Bx, hydro 3 marker, neg   BREAST LUMPECTOMY WITH NEEDLE LOCALIZATION Left 12/10/2021   Procedure: BREAST LUMPECTOMY WITH NEEDLE LOCALIZATION, PECTORAL BLOCK;  Surgeon: Earline Mayotte, MD;  Location: [facility] ORS;  Service: General;  Laterality: Left;   INCISION AND DRAINAGE ABSCESS Left 09/16/2022   Procedure: INCISION AND DRAINAGE ABSCESS;  Surgeon: Earline Mayotte, MD;  Location: [facility] ORS;  Service: General;  Laterality: Left;  seroma (not abscess)   KNEE ARTHROSCOPY Right 2008   PORTACATH PLACEMENT Right 05/07/2021   Procedure: INSERTION PORT-A-CATH;  Surgeon: Earline Mayotte, MD;  Location: [facility] ORS;  Service: General;  Laterality: Right;   TOOTH EXTRACTION     TRANSCERVICAL UTERINE FIBROID(S) ABLATION  2010    SOCIAL HISTORY: Social History   Socioeconomic History   Marital status: Married    Spouse name: Not on file   Number of children: Not on file   Years of education: Not on file   Highest education level: Not on file  Occupational History   Not on file  Tobacco Use   Smoking status: Never   Smokeless tobacco: Never  Vaping Use   Vaping status: Never Used   Substance and Sexual Activity   Alcohol use: Never   Drug use: Never   Sexual activity: Yes  Other Topics Concern   Not on file  Social History Narrative   ** Merged History Encounter **       Social Drivers of Corporate investment banker Strain: Not on file  Food Insecurity: Not on file  Transportation Needs: Not on file  Physical Activity: Not on file  Stress: Not on file  Social Connections: Not on file  Intimate Partner Violence: Not on file    FAMILY HISTORY: Family History  Problem Relation Age of Onset   Breast cancer Paternal Grandmother    Breast cancer Sister        dx 30s, recurrence x3   Diabetes Sister    Diabetes Father    Throat cancer Maternal Grandmother     ALLERGIES:  is allergic to other.  MEDICATIONS:  Current Outpatient Medications  Medication Sig Dispense Refill   acetaminophen (TYLENOL) 650 MG CR tablet Take 1,300 mg by mouth See admin instructions. Take 1300 mg in the morning may take a second 1300 mg dose during the day as needed for pain     calcium carbonate (OS-CAL) 600 MG TABS tablet Take 2 tablets by mouth daily with breakfast.     celecoxib (CELEBREX) 100 MG capsule Take 100 mg by mouth 2 (two) times daily.     cholecalciferol (VITAMIN D) 25 MCG (1000 UNIT) tablet Take 1,000 Units by mouth daily.     cyclobenzaprine (FLEXERIL) 10 MG tablet 2 (two) times daily as needed     diclofenac Sodium (VOLTAREN) 1 % GEL Apply 1 application  topically daily as needed.     fluticasone (FLONASE) 50 MCG/ACT nasal spray Place 1 spray into both nostrils daily.     gabapentin (NEURONTIN) 600 MG tablet Take 600 mg by mouth every morning.     HYDROcodone-acetaminophen (NORCO/VICODIN) 5-325 MG tablet      letrozole (FEMARA) 2.5 MG tablet Take 1 tablet (2.5 mg total) by mouth daily. 30 tablet 6   lidocaine-prilocaine (EMLA) cream Apply 1 Application topically as needed. Apply small amount of cream over port a cath site 1-2 hours prior to port being accessed.  30 g 2   Multiple Vitamins-Minerals (MULTIVITAMIN ADULT) CHEW Chew by mouth.     phentermine (ADIPEX-P) 37.5 MG tablet Take 18.75 mg by mouth daily.     No current facility-administered medications for this visit.   Facility-Administered Medications Ordered in Other Visits  Medication Dose  Route Frequency Provider Last Rate Last Admin   heparin lock flush 100 unit/mL  500 Units Intravenous Once [name], MD       heparin lock flush 100 unit/mL  500 Units Intravenous Once [name], MD       sodium chloride flush (NS) 0.9 % injection 10 mL  10 mL Intravenous Once [name], MD       sodium chloride flush (NS) 0.9 % injection 10 mL  10 mL Intravenous PRN [name], MD         PHYSICAL EXAMINATION: ECOG PERFORMANCE STATUS: 0 - Asymptomatic Vitals:   01/09/24 0953  BP: 119/74  Pulse: 83  Resp: 18  Temp: (!) 97.1 F (36.2 C)  SpO2: 100%   Filed Weights   01/09/24 0953  Weight: 211 lb 11.2 oz (96 kg)    Physical Exam Constitutional:      General: She is not in acute distress. HENT:     Head: Normocephalic and atraumatic.  Eyes:     General: No scleral icterus. Cardiovascular:     Rate and Rhythm: Normal rate and regular rhythm.     Heart sounds: Normal heart sounds.  Pulmonary:     Effort: Pulmonary effort is normal. No respiratory distress.     Breath sounds: No wheezing.  Abdominal:     General: Bowel sounds are normal. There is no distension.     Palpations: Abdomen is soft.  Musculoskeletal:        General: No deformity. Normal range of motion.     Cervical back: Normal range of motion and neck supple.  Skin:    General: Skin is warm and dry.     Findings: No erythema or rash.  Neurological:     Mental Status: She is alert and oriented to person, place, and time. Mental status is at baseline.     Cranial Nerves: No cranial nerve deficit.     Coordination: Coordination normal.  Psychiatric:        Mood and Affect: Mood normal.    Left breast no palpable mass, no  palpable axillary lymphadenopathy  LABORATORY DATA:  I have reviewed the data as listed    Latest Ref Rng & Units 01/09/2024    9:37 AM 10/10/2023    9:54 AM 07/04/2023    9:48 AM  CBC  WBC 4.0 - 10.5 K/uL 4.0  2.5  2.5   Hemoglobin 12.0 - 15.0 g/dL 96.2  95.2  84.1   Hematocrit 36.0 - 46.0 % 38.3  35.9  37.5   Platelets 150 - 400 K/uL 222  180  178       Latest Ref Rng & Units 01/09/2024    9:37 AM 10/10/2023    9:54 AM 07/04/2023    9:48 AM  CMP  Glucose 70 - 99 mg/dL 97  99  92   BUN 6 - 20 mg/dL 25  14  23    Creatinine 0.44 - 1.00 mg/dL 3.24  4.01  0.27   Sodium 135 - 145 mmol/L 138  137  137   Potassium 3.5 - 5.1 mmol/L 4.4  4.2  3.7   Chloride 98 - 111 mmol/L 102  107  104   CO2 22 - 32 mmol/L 28  25  25    Calcium 8.9 - 10.3 mg/dL 25.3  9.4  66.4   Total Protein 6.5 - 8.1 g/dL 8.3  7.7  8.0   Total Bilirubin 0.0 - 1.2 mg/dL 0.5  0.6  0.5   Alkaline Phos 38 - 126 U/L 63  40  43   AST 15 - 41 U/L 27  29  25    ALT 0 - 44 U/L 40  26  21     RADIOGRAPHIC STUDIES: I have personally reviewed the radiological images as listed and agreed with the findings in the report. No results found.

## 2024-01-09 NOTE — Assessment & Plan Note (Signed)
 Continue gabapentin 600 mg twice daily. 

## 2024-01-09 NOTE — Assessment & Plan Note (Signed)
Continue calcium and vitamin D  supplementation.  Hives/allergic reaction after  zometa. Switched to prolia 60mg  Q6 months.   Prolia Q6 months-  next due Jan 2025

## 2024-01-09 NOTE — Assessment & Plan Note (Addendum)
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  -s/p neoadjuvant pembrolizumab/carboplatin Q3 weeks / weekly Taxol- followed by ddAC x4. -s/p left upper outer lumpectomy with sentinel lymph node biopsy  ypTis ypN0.   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 Tonna Boehringer orders.

## 2024-01-09 NOTE — Assessment & Plan Note (Signed)
Mild, possibly related to recent COVID/treatment side effect, dehydration.  Repeat calcium in 4 weeks. Check PTH

## 2024-01-10 LAB — CANCER ANTIGEN 27.29: CA 27.29: 13.1 U/mL (ref 0.0–38.6)

## 2024-01-10 LAB — CANCER ANTIGEN 15-3: CA 15-3: 5.3 U/mL (ref 0.0–25.0)

## 2024-01-11 LAB — PARATHYROID HORMONE, INTACT (NO CA): PTH: 14 pg/mL — ABNORMAL LOW (ref 15–65)

## 2024-01-22 ENCOUNTER — Telehealth: Payer: Self-pay

## 2024-01-22 DIAGNOSIS — Z171 Estrogen receptor negative status [ER-]: Secondary | ICD-10-CM

## 2024-01-22 NOTE — Telephone Encounter (Signed)
Spoke to pt and informed her of MD recommendation for PET scan, pt verbalized understanding. Please schedule PET and inform pt of appt details.

## 2024-01-22 NOTE — Telephone Encounter (Signed)
-----   Message from [name] sent at 01/20/2024  3:36 PM EST ----- Please let patient know that I recommend her to get a PET scan done for evaluation due to development of high calcium

## 2024-01-24 ENCOUNTER — Encounter: Payer: Self-pay | Admitting: Oncology

## 2024-01-26 ENCOUNTER — Encounter: Payer: Self-pay | Admitting: Oncology

## 2024-01-29 ENCOUNTER — Ambulatory Visit
Admission: RE | Admit: 2024-01-29 | Discharge: 2024-01-29 | Disposition: A | Discharge status: Home / Self Care | Source: Ambulatory Visit | Attending: Oncology | Admitting: Oncology

## 2024-01-29 ENCOUNTER — Encounter: Payer: Self-pay | Admitting: Oncology

## 2024-01-29 DIAGNOSIS — Z171 Estrogen receptor negative status [ER-]: Secondary | ICD-10-CM | POA: Insufficient documentation

## 2024-01-29 DIAGNOSIS — C50812 Malignant neoplasm of overlapping sites of left female breast: Secondary | ICD-10-CM | POA: Diagnosis present

## 2024-01-29 DIAGNOSIS — Z9221 Personal history of antineoplastic chemotherapy: Secondary | ICD-10-CM | POA: Diagnosis not present

## 2024-01-29 LAB — GLUCOSE, CAPILLARY: Glucose-Capillary: 74 mg/dL (ref 70–99)

## 2024-01-29 MED ORDER — FLUDEOXYGLUCOSE F - 18 (FDG) INJECTION
11.8400 | Freq: Once | INTRAVENOUS | Status: AC | PRN
  Administered 2024-01-29: 11.84 via INTRAVENOUS

## 2024-02-05 ENCOUNTER — Other Ambulatory Visit: Payer: Self-pay | Admitting: *Deleted

## 2024-02-06 ENCOUNTER — Inpatient Hospital Stay: Attending: Oncology

## 2024-02-06 DIAGNOSIS — C50412 Malignant neoplasm of upper-outer quadrant of left female breast: Secondary | ICD-10-CM | POA: Diagnosis present

## 2024-02-06 DIAGNOSIS — M8580 Other specified disorders of bone density and structure, unspecified site: Secondary | ICD-10-CM | POA: Insufficient documentation

## 2024-02-06 DIAGNOSIS — Z170 Estrogen receptor positive status [ER+]: Secondary | ICD-10-CM | POA: Diagnosis not present

## 2024-02-06 DIAGNOSIS — Z79811 Long term (current) use of aromatase inhibitors: Secondary | ICD-10-CM | POA: Insufficient documentation

## 2024-02-07 LAB — CALCIUM, IONIZED: Calcium, Ionized, Serum: 5.3 mg/dL (ref 4.5–5.6)

## 2024-02-13 ENCOUNTER — Telehealth: Payer: Self-pay

## 2024-02-13 DIAGNOSIS — R948 Abnormal results of function studies of other organs and systems: Secondary | ICD-10-CM

## 2024-02-13 NOTE — Telephone Encounter (Signed)
-----   Message from [name] sent at 02/13/2024  9:07 AM EST ----- Please let patient know that PET scan did not show any findings to suggest cancer spread.  There is an area in her right breast showing mild activity. This could be scar tissue. I recommend to get bilateral diagnostic mammogram with right breast US for further evaluation.

## 2024-02-13 NOTE — Telephone Encounter (Signed)
 Spoke to pt and informed her of PET result and MD recommendation. Informed pt that [facility] should contact her to schedule mammogram.

## 2024-02-19 ENCOUNTER — Telehealth: Payer: Self-pay | Admitting: Oncology

## 2024-02-19 ENCOUNTER — Encounter: Payer: Self-pay | Admitting: Oncology

## 2024-02-19 NOTE — Telephone Encounter (Signed)
 Mammo and [place] are due in March 2025. They are not scheduled yet. I called pt and left a vm for pt to call [facility] at [phone] to get those appts scheduled for a day/time that works for her.

## 2024-02-22 ENCOUNTER — Ambulatory Visit
Admission: RE | Admit: 2024-02-22 | Discharge: 2024-02-22 | Disposition: A | Discharge status: Home / Self Care | Source: Ambulatory Visit | Attending: Oncology | Admitting: Oncology

## 2024-02-22 DIAGNOSIS — R948 Abnormal results of function studies of other organs and systems: Secondary | ICD-10-CM

## 2024-02-27 ENCOUNTER — Encounter: Payer: Self-pay | Admitting: Oncology

## 2024-03-11 ENCOUNTER — Other Ambulatory Visit

## 2024-03-11 ENCOUNTER — Encounter

## 2024-03-17 ENCOUNTER — Other Ambulatory Visit: Payer: Self-pay | Admitting: Oncology

## 2024-03-19 ENCOUNTER — Encounter: Payer: Self-pay | Admitting: Oncology

## 2024-03-21 ENCOUNTER — Encounter: Payer: Self-pay | Admitting: Oncology

## 2024-03-28 ENCOUNTER — Ambulatory Visit
Admission: RE | Admit: 2024-03-28 | Discharge: 2024-03-28 | Disposition: A | Discharge status: Home / Self Care | Source: Ambulatory Visit | Attending: Radiation Oncology | Admitting: Radiation Oncology

## 2024-03-28 ENCOUNTER — Encounter: Payer: Self-pay | Admitting: Oncology

## 2024-03-28 VITALS — BP 133/97 | HR 84 | Temp 97.0°F | Resp 16 | Ht 67.0 in | Wt 201.5 lb

## 2024-03-28 DIAGNOSIS — Z170 Estrogen receptor positive status [ER+]: Secondary | ICD-10-CM | POA: Diagnosis not present

## 2024-03-28 DIAGNOSIS — C50412 Malignant neoplasm of upper-outer quadrant of left female breast: Secondary | ICD-10-CM | POA: Insufficient documentation

## 2024-03-28 DIAGNOSIS — Z923 Personal history of irradiation: Secondary | ICD-10-CM | POA: Insufficient documentation

## 2024-03-28 DIAGNOSIS — Z79811 Long term (current) use of aromatase inhibitors: Secondary | ICD-10-CM | POA: Insufficient documentation

## 2024-03-28 NOTE — Progress Notes (Signed)
 Radiation Oncology Follow up Note  Name: Peggy Bates   Date:   03/28/2024 MRN:  [id number] DOB: 1966-03-30    This 58 y.o. female presents to the clinic today for 2-year follow-up status post whole breast radiation to her left breast for triple negative invasive mammary carcinoma status post neoadjuvant chemotherapy.  REFERRING PROVIDER: [name], NP  HPI: Patient is a 58 year old female now out over 2 years having pleated whole breast radiation to her left breast for triple negative invasive mammary carcinoma.  Seen today in routine follow-up she is doing well.  She specifically denies breast tenderness cough or bone pain..  She is on letrozole tolerating that well without side effect.  Her last mammogram which I have reviewed was back in March with BI-RADS 2 benign.  PET scan in February which I have also reviewed showed no findings suggestive of metastatic disease.  She did have a linear band of probable scar tissue involving the medial aspect of the right breast.  COMPLICATIONS OF TREATMENT: none  FOLLOW UP COMPLIANCE: keeps appointments   PHYSICAL EXAM:  BP (!) 133/97 Comment: Patient busy at work before arriving here  Pulse 84   Temp (!) 97 F (36.1 C) (Tympanic)   Resp 16   Ht 5\' 7"  (1.702 m)   Wt 201 lb 8 oz (91.4 kg)   LMP  (LMP Unknown)   BMI 31.56 kg/m  Lungs are clear to A&P cardiac examination essentially unremarkable with regular rate and rhythm. No dominant mass or nodularity is noted in either breast in 2 positions examined. Incision is well-healed. No axillary or supraclavicular adenopathy is appreciated. Cosmetic result is excellent.  Well-developed well-nourished patient in NAD. HEENT reveals PERLA, EOMI, discs not visualized.  Oral cavity is clear. No oral mucosal lesions are identified. Neck is clear without evidence of cervical or supraclavicular adenopathy. Lungs are clear to A&P. Cardiac examination is essentially unremarkable with regular rate and  rhythm without murmur rub or thrill. Abdomen is benign with no organomegaly or masses noted. Motor sensory and DTR levels are equal and symmetric in the upper and lower extremities. Cranial nerves II through XII are grossly intact. Proprioception is intact. No peripheral adenopathy or edema is identified. No motor or sensory levels are noted. Crude visual fields are within normal range.  RADIOLOGY RESULTS: PET scan and mammograms reviewed compatible with above-stated findings  PLAN: The present time patient continues to do well out over 2 years with no evidence of disease.  I will turn follow-up care over to her prior surgical team as well as medical oncology.  I would be happy to reevaluate the patient anytime should that be indicated.  I would like to take this opportunity to thank you for allowing me to participate in the care of your patient.Carmina Miller, MD

## 2024-04-09 ENCOUNTER — Encounter: Payer: Self-pay | Admitting: Oncology

## 2024-04-09 ENCOUNTER — Inpatient Hospital Stay: Payer: TRICARE For Life (TFL) | Attending: Oncology

## 2024-04-09 ENCOUNTER — Inpatient Hospital Stay (HOSPITAL_BASED_OUTPATIENT_CLINIC_OR_DEPARTMENT_OTHER): Payer: TRICARE For Life (TFL) | Admitting: Oncology

## 2024-04-09 VITALS — BP 128/83 | HR 91 | Temp 96.6°F | Resp 18 | Wt 202.4 lb

## 2024-04-09 DIAGNOSIS — G620 Drug-induced polyneuropathy: Secondary | ICD-10-CM | POA: Diagnosis not present

## 2024-04-09 DIAGNOSIS — Z803 Family history of malignant neoplasm of breast: Secondary | ICD-10-CM | POA: Diagnosis not present

## 2024-04-09 DIAGNOSIS — Z923 Personal history of irradiation: Secondary | ICD-10-CM | POA: Insufficient documentation

## 2024-04-09 DIAGNOSIS — Z79811 Long term (current) use of aromatase inhibitors: Secondary | ICD-10-CM | POA: Diagnosis not present

## 2024-04-09 DIAGNOSIS — C50412 Malignant neoplasm of upper-outer quadrant of left female breast: Secondary | ICD-10-CM | POA: Insufficient documentation

## 2024-04-09 DIAGNOSIS — C50812 Malignant neoplasm of overlapping sites of left female breast: Secondary | ICD-10-CM

## 2024-04-09 DIAGNOSIS — M8580 Other specified disorders of bone density and structure, unspecified site: Secondary | ICD-10-CM | POA: Insufficient documentation

## 2024-04-09 DIAGNOSIS — Z171 Estrogen receptor negative status [ER-]: Secondary | ICD-10-CM | POA: Diagnosis not present

## 2024-04-09 DIAGNOSIS — T451X5D Adverse effect of antineoplastic and immunosuppressive drugs, subsequent encounter: Secondary | ICD-10-CM | POA: Diagnosis not present

## 2024-04-09 DIAGNOSIS — Z1732 Human epidermal growth factor receptor 2 negative status: Secondary | ICD-10-CM | POA: Insufficient documentation

## 2024-04-09 DIAGNOSIS — M8589 Other specified disorders of bone density and structure, multiple sites: Secondary | ICD-10-CM

## 2024-04-09 DIAGNOSIS — Z800 Family history of malignant neoplasm of digestive organs: Secondary | ICD-10-CM | POA: Insufficient documentation

## 2024-04-09 DIAGNOSIS — Z1722 Progesterone receptor negative status: Secondary | ICD-10-CM | POA: Insufficient documentation

## 2024-04-09 DIAGNOSIS — Z79899 Other long term (current) drug therapy: Secondary | ICD-10-CM | POA: Insufficient documentation

## 2024-04-09 LAB — CMP (CANCER CENTER ONLY)
ALT: 26 U/L (ref 0–44)
AST: 26 U/L (ref 15–41)
Albumin: 4.5 g/dL (ref 3.5–5.0)
Alkaline Phosphatase: 38 U/L (ref 38–126)
Anion gap: 10 (ref 5–15)
BUN: 17 mg/dL (ref 6–20)
CO2: 24 mmol/L (ref 22–32)
Calcium: 9.9 mg/dL (ref 8.9–10.3)
Chloride: 106 mmol/L (ref 98–111)
Creatinine: 0.69 mg/dL (ref 0.44–1.00)
GFR, Estimated: >60 mL/min (ref >60)
Glucose, Bld: 98 mg/dL (ref 70–99)
Potassium: 4 mmol/L (ref 3.5–5.1)
Sodium: 140 mmol/L (ref 135–145)
Total Bilirubin: 0.7 mg/dL (ref 0.0–1.2)
Total Protein: 7.6 g/dL (ref 6.5–8.1)

## 2024-04-09 LAB — CBC WITH DIFFERENTIAL (CANCER CENTER ONLY)
Abs Immature Granulocytes: 0 10*3/uL (ref 0.00–0.07)
Basophils Absolute: 0 10*3/uL (ref 0.0–0.1)
Basophils Relative: 0 %
Eosinophils Absolute: 0 10*3/uL (ref 0.0–0.5)
Eosinophils Relative: 1 %
HCT: 36.4 % (ref 36.0–46.0)
Hemoglobin: 11.6 g/dL — ABNORMAL LOW (ref 12.0–15.0)
Immature Granulocytes: 0 %
Lymphocytes Relative: 48 %
Lymphs Abs: 1.3 10*3/uL (ref 0.7–4.0)
MCH: 29.5 pg (ref 26.0–34.0)
MCHC: 31.9 g/dL (ref 30.0–36.0)
MCV: 92.6 fL (ref 80.0–100.0)
Monocytes Absolute: 0.2 10*3/uL (ref 0.1–1.0)
Monocytes Relative: 8 %
Neutro Abs: 1.2 10*3/uL — ABNORMAL LOW (ref 1.7–7.7)
Neutrophils Relative %: 43 %
Platelet Count: 181 10*3/uL (ref 150–400)
RBC: 3.93 MIL/uL (ref 3.87–5.11)
RDW: 13.7 % (ref 11.5–15.5)
WBC Count: 2.8 10*3/uL — ABNORMAL LOW (ref 4.0–10.5)
nRBC: 0 % (ref 0.0–0.2)

## 2024-04-09 NOTE — Progress Notes (Signed)
 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  -s/p neoadjuvant pembrolizumab /carboplatin  Q3 weeks / weekly Taxol - followed by ddAC x4. -s/p left upper outer lumpectomy with sentinel lymph node biopsy  ypTis ypN0.    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 Rosea Conch orders.   Aromatase inhibitor use Continue calcium supplementation and Vitamin D supplementation.   Chemotherapy-induced neuropathy (HCC) Continue gabapentin 600 mg twice daily.   Osteopenia Continue calcium and vitamin D  supplementation.  Hives/allergic reaction after  zometa . Switched to prolia  60mg  Q6 months.   Prolia  Q6 months-  next due July 2025   Orders Placed This Encounter  Procedures   CBC with Differential (Cancer Center Only)    Standing Status:   Future    Expected Date:   07/09/2024    Expiration Date:   04/09/2025   CMP (Cancer Center only)    Standing Status:   Future    Expected Date:   07/09/2024    Expiration Date:   04/09/2025   Cancer antigen 15-3    Standing Status:   Future    Expected Date:   07/09/2024    Expiration Date:   04/09/2025   Cancer antigen 27.29    Standing Status:   Future    Expected Date:   07/09/2024    Expiration Date:   04/09/2025    Follow-up in 3 months, lab  MD.   All questions were answered. The patient knows to call the clinic with any problems, questions or concerns.  Timmy Forbes, MD, [name] [facility] Hematology Oncology 04/09/2024     HISTORY OF PRESENTING ILLNESS:   Peggy Bates is a  58 y.o.  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 Oncology history summary listed as below Oncology History Overview Note  # Pathology dated April 07, 2021 from [facility] in [place] :  Ultrasound-guided core biopsy. Nuclear grade 3, high-grade. P53: 70% staining. Ki-67: 70% staining.  ER negative, PR negative, HER2 negative. Mitotic rate of 13 mitoses per high-power field.  The patient brought a copy of her imaging studies with her from [place] and these have been independently reviewed.  Screening mammogram dated March 30, 2021 showed an asymmetric nodular density in the superior lateral aspect of the left breast. The right breast was unremarkable BI-RADS-0.  Ultrasound examination of the left breast showed a 1.1 cm round, nonparallel hypoechoic mass with angular margins. BI-RADS-4.  Family history of breast cancer and a sister in her late 17s, early 79s. No history of genetic testing available at this time.   Breast cancer in female [facility])  04/21/2021 Initial Diagnosis   Left breast triple negative cancer, residual ER positive DCIS  -03/30/2021, screening mammogram with 3D 2 nodular areas of asymmetric density demonstrated within the superior lateral aspect  of the left breast middle third depth.  Finding was best appreciated on 3D tomosynthesis imaging. Targeted ultrasound showed left upper outer breast 1:30 position 1.1 cm round hypoechoic mass with angular margins. 04/21/2021  biopsy of the breast mass Pathology showed infiltrating ductal carcinoma, grade 3, Ki-67 70%, ER negative, PR negative, HER2 negative,    Case was discussed on Tumor board.   Pathology reports and mammogram/ultrasound were done at outside facility and results were reviewed and discussed with patient and her husband.  LN status was not mentioned on her US . Recommend additional images with mammogram and MRI, neoadjuvant chemotherapy    05/12/2021 diagnostic mammogram of left breast showed 2.1 cm biopsy-proven malignancy in the lateral left breast at 2:00, 4 cm from nipple.  Directly adjacent cystic mass, 1.5 cm in size, consistent with biopsy hematoma.  1 abnormal and 1 borderline abnormal left axillary lymph node  05/18/2021 bilateral breast MRI with and without contrast showed 2.5 x 3 x 2.5 cm hematoma containing biopsy clip artifact within the upper outer left breast.  1.6 cm nodular non-mass-like enhancement 2.5 cm posterior/superior to the biopsy clip may represent biopsy-proven malignancy or additional areas of malignancy.  Single abnormal appearing left axillary lymph node with eccentric cortical thickening.  No MRI evidence of the right breast malignancy.   05/20/2021 Slide consultation of her left breast biopsy from outside institution - invasive mammary carcinoma, no special type, grade 3, DCIS and LVI not identified. - ER negative, PR negative, HER2 IHC negative. Ki 67 70-80%     05/20/2021 ultrasound-guided left axillary lymph node was negative for malignancy.  06/02/2021 MRI left breast biopsy of the upper outer quadrant - pathology shows high grade DCIS, ER 30%+    04/29/2021 Cancer Staging   Staging form: Breast, AJCC 8th Edition - Clinical stage from 04/29/2021: Stage IIB (cT2, cN0, cM0, G3, ER-, PR-, HER2-) - Signed by Timmy Forbes, MD on 06/04/2021 Stage prefix: Initial diagnosis Histologic grading system: 3 grade system   05/07/2021 Procedure   Mediport was placed by Dr. Marquita Situ   05/25/2021 Genetic Testing   Negative genetic testing. No pathogenic variants identified on the [facility] Multi-Cancer Panel +RNA. The report date is 05/25/2021.  The Multi-Cancer Panel +  RNA offered by [facility] includes sequencing and/or deletion duplication testing of the following 84 genes: AIP, ALK, APC, ATM, AXIN2,BAP1,  BARD1, BLM, BMPR1A, BRCA1, BRCA2, BRIP1, CASR, CDC73, CDH1, CDK4, CDKN1B, CDKN1C, CDKN2A (p14ARF), CDKN2A (p16INK4a), CEBPA, CHEK2, CTNNA1, DICER1, DIS3L2, EGFR (c.2369C>T, p.Thr790Met variant only), EPCAM (Deletion/duplication testing only), FH, FLCN, GATA2, GPC3, GREM1 (Promoter region deletion/duplication testing only), HOXB13 (c.251G>A, p.Gly84Glu), HRAS, KIT, MAX, MEN1, MET, MITF (c.952G>A, p.Glu318Lys variant only), MLH1, MSH2, MSH3, MSH6, MUTYH, NBN, NF1, NF2, NTHL1, PALB2, PDGFRA, PHOX2B, PMS2, POLD1, POLE, POT1, PRKAR1A, PTCH1, PTEN, RAD50, RAD51C, RAD51D, RB1, RECQL4, RET, RUNX1, SDHAF2, SDHA (sequence changes only), SDHB, SDHC, SDHD, SMAD4, SMARCA4, SMARCB1, SMARCE1, STK11, SUFU, TERC, TERT, TMEM127, TP53, TSC1, TSC2, VHL, WRN and WT1.   06/08/2021 - 10/28/2021 Chemotherapy   pembrolizumab /carboplatin  Q 3 weeks / weekly Taxol  followed by ddAC x4 with G-CSF support       06/08/2021 - 10/28/2021 Chemotherapy   Patient is on Treatment Plan : BREAST Pembrolizumab  + Carboplatin  weekly + Paclitaxel  D1,8,15 q21d X 4 cycles /  AC q21d x 4 cycles     08/30/2021 Imaging   MRI breast showed no abnormal enhancement remains in the left breast. Complete response to chemotherapy is identified. No MRI evidence of malignancy in either breast.  11/15/2021 Imaging   bilateral MRI breast showed no abnormal enhancement is identified in bilateral breast.   12/10/2021 Surgery   patient underwent left upper outer lumpectomy with sentinel lymph node biopsy.  Residual DCIS, high-grade, 4 sentinel lymph nodes were harvested and all negative for malignancy.  Margins are negative for DCIS, closest margin is greater than 5 mm. ypTis ypN0. Residual DCIS ER 11-50%   01/04/2022 - 02/10/2022 Radiation Therapy   adjuvant breast radiation   03/11/2022 -  Anti-estrogen oral therapy    Started on letrozole  2.5 mg daily   05/12/2022 Mammogram   Bilateral diagnostic mammogram/ultrasound showed No mammographic evidence of malignancy   05/12/2022 Imaging   Bone density showed osteopenia FRAX* RESULTS:  10-year Probability of Fracture1 Major Osteoporotic Fracture2 Hip Fracture 2.2% 0.1%   02/13/2024 Imaging   PET scan showed  1. No findings for metastatic disease involving the neck, chest, abdomen/pelvis or bony structures. 2. Linear band of probable scar tissue involving the medial aspect of the right breast. This demonstrates mild hypermetabolism extending to the skin. Recommend correlation with recent mammograms and ultrasound.     02/22/2024 Mammogram   Bilateral diagnostic mammogram showed  1. No mammographic evidence of malignancy bilaterally. 2. PET CT findings correspond to postsurgical changes status post port removal.      INTERVAL HISTORY Peggy Bates is a 58 y.o. female who has above history reviewed by me today presents for follow up visit for management of left triple negative breast cancer  Patient has been on letrozole  2.5 mg daily.   + arthralgia and stiffness, hot flash.   . Review of Systems  Constitutional:  Positive for fatigue. Negative for appetite change, chills and fever.  HENT:   Negative for hearing loss and voice change.   Eyes:  Negative for eye problems.  Respiratory:  Negative for chest tightness and cough.   Cardiovascular:  Negative for chest pain.  Gastrointestinal:  Negative for abdominal distention, abdominal pain and blood in stool.  Endocrine: Positive for hot flashes.  Genitourinary:  Negative for difficulty urinating and frequency.   Musculoskeletal:  Positive for arthralgias.  Skin:  Negative for itching and rash.  Neurological:  Positive for numbness. Negative for extremity weakness.  Hematological:  Negative for adenopathy.  Psychiatric/Behavioral:  Negative for confusion.     MEDICAL HISTORY:  Past  Medical History:  Diagnosis Date   Arthritis    knees   Breast cancer (HCC)    2022   COVID-19 12/2020   Depression    Family history of breast cancer    Goiter    Malignant neoplasm of left female breast (HCC) 04/07/2021   Personal history of chemotherapy    Personal history of radiation therapy     SURGICAL HISTORY: Past Surgical History:  Procedure Laterality Date   ADJACENT TISSUE TRANSFER/TISSUE REARRANGEMENT Left 12/10/2021   Procedure: ADJACENT TISSUE TRANSFER/TISSUE REARRANGEMENT;  Surgeon: Marshall Skeeter, MD;  Location: [facility] ORS;  Service: General;  Laterality: Left;   AXILLARY SENTINEL NODE BIOPSY Left 12/10/2021   Procedure: AXILLARY SENTINEL NODE BIOPSY;  Surgeon: Marshall Skeeter, MD;  Location: [facility] ORS;  Service: General;  Laterality: Left;   BREAST BIOPSY Left 04/07/2021   [facility] in [place] :u/s bx triple neg   BREAST BIOPSY Left 05/20/2021   US  Axilla Bx, hydro 3 marker, neg   BREAST LUMPECTOMY WITH NEEDLE LOCALIZATION Left 12/10/2021   Procedure: BREAST LUMPECTOMY WITH NEEDLE LOCALIZATION, PECTORAL BLOCK;  Surgeon: Marshall Skeeter, MD;  Location:  [facility] ORS;  Service: General;  Laterality: Left;   INCISION AND DRAINAGE ABSCESS Left 09/16/2022   Procedure: INCISION AND DRAINAGE ABSCESS;  Surgeon: Marshall Skeeter, MD;  Location: [facility] OR[facility];  Service: General;  Laterality: Left;  seroma (not abscess)   KNEE ARTHROSCOPY Right 2008   PORTACATH PLACEMENT Right 05/07/2021   Procedure: INSERTION PORT-A-CATH;  Surgeon: Marshall Skeeter, MD;  Location: [facility] OR[facility];  Service: General;  Laterality: Right;   TOOTH EXTRACTION     TRANSCERVICAL UTERINE FIBROID(S) ABLATION  2010    SOCIAL HISTORY: Social History   Socioeconomic History   Marital status: Married    Spouse name: Not on file   Number of children: Not on file   Years of education: Not on file   Highest education level: Not on file  Occupational History   Not on file  Tobacco Use    Smoking status: Never   Smokeless tobacco: Never  Vaping Use   Vaping status: Never Used  Substance and Sexual Activity   Alcohol use: Never   Drug use: Never   Sexual activity: Yes  Other Topics Concern   Not on file  Social History Narrative   ** Merged History Encounter **       Social Drivers of Corporate investment banker Strain: Not on file  Food Insecurity: Not on file  Transportation Needs: Not on file  Physical Activity: Not on file  Stress: Not on file  Social Connections: Not on file  Intimate Partner Violence: Not on file    FAMILY HISTORY: Family History  Problem Relation Age of Onset   Breast cancer Paternal Grandmother    Breast cancer Sister        dx 30s, recurrence x3   Diabetes Sister    Diabetes Father    Throat cancer Maternal Grandmother     ALLERGIES:  is allergic to other.  MEDICATIONS:  Current Outpatient Medications  Medication Sig Dispense Refill   acetaminophen  (TYLENOL ) 650 MG CR tablet Take 1,300 mg by mouth See admin instructions. Take 1300 mg in the morning may take a second 1300 mg dose during the day as needed for pain     calcium carbonate (OS-CAL) 600 MG TABS tablet Take 2 tablets by mouth daily with breakfast.     celecoxib (CELEBREX) 100 MG capsule Take 100 mg by mouth 2 (two) times daily.     cholecalciferol (VITAMIN D) 25 MCG (1000 UNIT) tablet Take 1,000 Units by mouth daily.     cyclobenzaprine (FLEXERIL) 10 MG tablet 2 (two) times daily as needed     diclofenac Sodium (VOLTAREN) 1 % GEL Apply 1 application  topically daily as needed.     fluticasone (FLONASE) 50 MCG/ACT nasal spray Place 1 spray into both nostrils daily.     gabapentin (NEURONTIN) 600 MG tablet Take 600 mg by mouth every morning.     HYDROcodone -acetaminophen  (NORCO/VICODIN) 5-325 MG tablet      letrozole  (FEMARA ) 2.5 MG tablet TAKE 1 TABLET(2.5 MG) BY MOUTH DAILY 30 tablet 6   lidocaine -prilocaine  (EMLA ) cream Apply 1 Application topically as needed. Apply  small amount of cream over port a cath site 1-2 hours prior to port being accessed. 30 g 2   Multiple Vitamins-Minerals (MULTIVITAMIN ADULT) CHEW Chew by mouth.     phentermine (ADIPEX-P) 37.5 MG tablet Take 18.75 mg by mouth daily.     No current facility-administered medications for this visit.   Facility-Administered Medications Ordered in Other Visits  Medication  Dose Route Frequency Provider Last Rate Last Admin   heparin  lock flush 100 unit/mL  500 Units Intravenous Once Timmy Forbes, MD       heparin  lock flush 100 unit/mL  500 Units Intravenous Once Timmy Forbes, MD       sodium chloride  flush (NS) 0.9 % injection 10 mL  10 mL Intravenous Once Timmy Forbes, MD       sodium chloride  flush (NS) 0.9 % injection 10 mL  10 mL Intravenous PRN Timmy Forbes, MD         PHYSICAL EXAMINATION: ECOG PERFORMANCE STATUS: 0 - Asymptomatic Vitals:   04/09/24 1010  BP: 128/83  Pulse: 91  Resp: 18  Temp: (!) 96.6 F (35.9 C)  SpO2: 100%   Filed Weights   04/09/24 1010  Weight: 202 lb 6.4 oz (91.8 kg)    Physical Exam Constitutional:      General: She is not in acute distress. HENT:     Head: Normocephalic and atraumatic.  Eyes:     General: No scleral icterus. Cardiovascular:     Rate and Rhythm: Normal rate and regular rhythm.     Heart sounds: Normal heart sounds.  Pulmonary:     Effort: Pulmonary effort is normal. No respiratory distress.     Breath sounds: No wheezing.  Abdominal:     General: Bowel sounds are normal. There is no distension.     Palpations: Abdomen is soft.  Musculoskeletal:        General: No deformity. Normal range of motion.     Cervical back: Normal range of motion and neck supple.  Skin:    General: Skin is warm and dry.     Findings: No erythema or rash.  Neurological:     Mental Status: She is alert and oriented to person, place, and time. Mental status is at baseline.     Cranial Nerves: No cranial nerve deficit.     Coordination: Coordination normal.   Psychiatric:        Mood and Affect: Mood normal.      LABORATORY DATA:  I have reviewed the data as listed    Latest Ref Rng & Units 04/09/2024   10:01 AM 01/09/2024    9:37 AM 10/10/2023    9:54 AM  CBC  WBC 4.0 - 10.5 K/uL 2.8  4.0  2.5   Hemoglobin 12.0 - 15.0 g/dL 16.1  09.6  04.5   Hematocrit 36.0 - 46.0 % 36.4  38.3  35.9   Platelets 150 - 400 K/uL 181  222  180       Latest Ref Rng & Units 04/09/2024   10:01 AM 01/09/2024    9:37 AM 10/10/2023    9:54 AM  CMP  Glucose 70 - 99 mg/dL 98  97  99   BUN 6 - 20 mg/dL 17  25  14    Creatinine 0.44 - 1.00 mg/dL 4.09  8.11  9.14   Sodium 135 - 145 mmol/L 140  138  137   Potassium 3.5 - 5.1 mmol/L 4.0  4.4  4.2   Chloride 98 - 111 mmol/L 106  102  107   CO2 22 - 32 mmol/L 24  28  25    Calcium 8.9 - 10.3 mg/dL 9.9  78.2  9.4   Total Protein 6.5 - 8.1 g/dL 7.6  8.3  7.7   Total Bilirubin 0.0 - 1.2 mg/dL 0.7  0.5  0.6   Alkaline Phos 38 - 126 U/L  38  63  40   AST 15 - 41 U/L 26  27  29    ALT 0 - 44 U/L 26  40  26     RADIOGRAPHIC STUDIES: I have personally reviewed the radiological images as listed and agreed with the findings in the report. MM 3D DIAGNOSTIC MAMMOGRAM BILATERAL BREAST Result Date: 02/22/2024 CLINICAL DATA:  Patient is status post LEFT lumpectomy and radiation in 2022 after neoadjuvant chemotherapy. Patient underwent ultrasound-guided biopsy of a LEFT breast mass demonstrated invasive mammary carcinoma. MRI guided biopsy demonstrated ductal carcinoma in-situ. Surgical lumpectomy specimen demonstrated in residual DCIS; negative margins. Recent PET scan demonstrated a band like area with mild increased uptake along the RIGHT inner breast. Patient had port removed in this area EXAM: DIGITAL DIAGNOSTIC BILATERAL MAMMOGRAM WITH TOMOSYNTHESIS AND CAD; ULTRASOUND RIGHT BREAST LIMITED TECHNIQUE: Bilateral digital diagnostic mammography and breast tomosynthesis was performed. The images were evaluated with computer-aided  detection. ; Targeted ultrasound examination of the right breast was performed COMPARISON:  Previous exam(s). ACR Breast Density Category b: There are scattered areas of fibroglandular density. FINDINGS: There is density and architectural distortion within the LEFT breast, consistent with postsurgical changes. These are stable in comparison to prior. No suspicious mass, distortion, or microcalcifications are identified to suggest presence of malignancy. In the RIGHT upper inner breast scar from prior port placement is noted. Targeted ultrasound was performed of the RIGHT inner breast at the site of PET scan concern. There is a superficial tract to the skin behind the port scar noted. It contains a small 7 mm seroma versus area of fat necrosis. This is consistent with benign postsurgical change. No suspicious cystic or solid mass is seen. IMPRESSION: 1. No mammographic evidence of malignancy bilaterally. 2. PET CT findings correspond to postsurgical changes status post port removal. RECOMMENDATION: Per protocol, as the patient is now 2 or more years status post lumpectomy, she may return to annual screening mammography in 1 year. However, given the history of breast cancer, the patient remains eligible for annual diagnostic mammography if preferred. (Code:SM-B-01Y) I have discussed the findings and recommendations with the patient. If applicable, a reminder letter will be sent to the patient regarding the next appointment. BI-RADS CATEGORY  2: Benign. Electronically Signed   By: Clancy Crimes M.D.   On: 02/22/2024 13:59   US  LIMITED ULTRASOUND INCLUDING AXILLA RIGHT BREAST Result Date: 02/22/2024 CLINICAL DATA:  Patient is status post LEFT lumpectomy and radiation in 2022 after neoadjuvant chemotherapy. Patient underwent ultrasound-guided biopsy of a LEFT breast mass demonstrated invasive mammary carcinoma. MRI guided biopsy demonstrated ductal carcinoma in-situ. Surgical lumpectomy specimen demonstrated in  residual DCIS; negative margins. Recent PET scan demonstrated a band like area with mild increased uptake along the RIGHT inner breast. Patient had port removed in this area EXAM: DIGITAL DIAGNOSTIC BILATERAL MAMMOGRAM WITH TOMOSYNTHESIS AND CAD; ULTRASOUND RIGHT BREAST LIMITED TECHNIQUE: Bilateral digital diagnostic mammography and breast tomosynthesis was performed. The images were evaluated with computer-aided detection. ; Targeted ultrasound examination of the right breast was performed COMPARISON:  Previous exam(s). ACR Breast Density Category b: There are scattered areas of fibroglandular density. FINDINGS: There is density and architectural distortion within the LEFT breast, consistent with postsurgical changes. These are stable in comparison to prior. No suspicious mass, distortion, or microcalcifications are identified to suggest presence of malignancy. In the RIGHT upper inner breast scar from prior port placement is noted. Targeted ultrasound was performed of the RIGHT inner breast at the site of PET scan concern.  There is a superficial tract to the skin behind the port scar noted. It contains a small 7 mm seroma versus area of fat necrosis. This is consistent with benign postsurgical change. No suspicious cystic or solid mass is seen. IMPRESSION: 1. No mammographic evidence of malignancy bilaterally. 2. PET CT findings correspond to postsurgical changes status post port removal. RECOMMENDATION: Per protocol, as the patient is now 2 or more years status post lumpectomy, she may return to annual screening mammography in 1 year. However, given the history of breast cancer, the patient remains eligible for annual diagnostic mammography if preferred. (Code:SM-B-01Y) I have discussed the findings and recommendations with the patient. If applicable, a reminder letter will be sent to the patient regarding the next appointment. BI-RADS CATEGORY  2: Benign. Electronically Signed   By: Clancy Crimes M.D.   On:  02/22/2024 13:59   NM PET Image Initial (PI) Skull Base To Thigh Result Date: 02/10/2024 CLINICAL DATA:  Initial treatment strategy for breast cancer. High calcium level. Evaluate for metastatic disease. EXAM: NUCLEAR MEDICINE PET SKULL BASE TO THIGH TECHNIQUE: 11.84 mCi F-18 FDG was injected intravenously. Full-ring PET imaging was performed from the skull base to thigh after the radiotracer. CT data was obtained and used for attenuation correction and anatomic localization. Fasting blood glucose: 74 mg/dl COMPARISON:  None Available. FINDINGS: Mediastinal blood pool activity: SUV max 2.68 Liver activity: SUV max NA NECK: No hypermetabolic lymph nodes in the neck. Incidental CT findings: None. CHEST: No hypermetabolic mediastinal or hilar nodes. No suspicious pulmonary nodules on the CT scan. No hypermetabolic breast masses, supraclavicular or axillary adenopathy. There is a linear band of probable scar tissue involving the medial aspect of the right breast. This demonstrates mild hypermetabolism extending to the skin with SUV max of 2.52. Recommend correlation with recent mammograms and ultrasound. Incidental CT findings: None. ABDOMEN/PELVIS: No abnormal hypermetabolic activity within the liver, pancreas, adrenal glands, or spleen. No hypermetabolic lymph nodes in the abdomen or pelvis. Incidental CT findings: None. SKELETON: No findings suspicious for osseous metastatic disease. No hypermetabolic bone lesions. Incidental CT findings: No lytic or sclerotic bone lesions are identified. IMPRESSION: 1. No findings for metastatic disease involving the neck, chest, abdomen/pelvis or bony structures. 2. Linear band of probable scar tissue involving the medial aspect of the right breast. This demonstrates mild hypermetabolism extending to the skin. Recommend correlation with recent mammograms and ultrasound. Electronically Signed   By: Marrian Siva M.D.   On: 02/10/2024 19:55

## 2024-04-09 NOTE — Assessment & Plan Note (Signed)
Continue calcium supplementation and Vitamin D supplementation.

## 2024-04-09 NOTE — Assessment & Plan Note (Addendum)
 Continue calcium and vitamin D  supplementation.  Hives/allergic reaction after  zometa . Switched to prolia  60mg  Q6 months.   Prolia  Q6 months-  next due July 2025

## 2024-04-09 NOTE — Assessment & Plan Note (Signed)
 Continue gabapentin 600 mg twice daily. 

## 2024-04-09 NOTE — Assessment & Plan Note (Signed)
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  -s/p neoadjuvant pembrolizumab/carboplatin Q3 weeks / weekly Taxol- followed by ddAC x4. -s/p left upper outer lumpectomy with sentinel lymph node biopsy  ypTis ypN0.   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 Tonna Boehringer orders.

## 2024-07-09 ENCOUNTER — Inpatient Hospital Stay: Attending: Oncology

## 2024-07-09 ENCOUNTER — Encounter: Payer: Self-pay | Admitting: Oncology

## 2024-07-09 ENCOUNTER — Inpatient Hospital Stay (HOSPITAL_BASED_OUTPATIENT_CLINIC_OR_DEPARTMENT_OTHER): Admitting: Oncology

## 2024-07-09 ENCOUNTER — Inpatient Hospital Stay

## 2024-07-09 VITALS — BP 118/78 | HR 89 | Temp 97.3°F | Resp 15 | Wt 201.0 lb

## 2024-07-09 DIAGNOSIS — C50412 Malignant neoplasm of upper-outer quadrant of left female breast: Secondary | ICD-10-CM

## 2024-07-09 DIAGNOSIS — T451X5D Adverse effect of antineoplastic and immunosuppressive drugs, subsequent encounter: Secondary | ICD-10-CM | POA: Insufficient documentation

## 2024-07-09 DIAGNOSIS — Z1732 Human epidermal growth factor receptor 2 negative status: Secondary | ICD-10-CM | POA: Insufficient documentation

## 2024-07-09 DIAGNOSIS — Z800 Family history of malignant neoplasm of digestive organs: Secondary | ICD-10-CM | POA: Insufficient documentation

## 2024-07-09 DIAGNOSIS — Z79811 Long term (current) use of aromatase inhibitors: Secondary | ICD-10-CM | POA: Insufficient documentation

## 2024-07-09 DIAGNOSIS — G620 Drug-induced polyneuropathy: Secondary | ICD-10-CM | POA: Diagnosis not present

## 2024-07-09 DIAGNOSIS — Z1722 Progesterone receptor negative status: Secondary | ICD-10-CM | POA: Insufficient documentation

## 2024-07-09 DIAGNOSIS — Z171 Estrogen receptor negative status [ER-]: Secondary | ICD-10-CM

## 2024-07-09 DIAGNOSIS — M8580 Other specified disorders of bone density and structure, unspecified site: Secondary | ICD-10-CM | POA: Insufficient documentation

## 2024-07-09 DIAGNOSIS — Z923 Personal history of irradiation: Secondary | ICD-10-CM | POA: Insufficient documentation

## 2024-07-09 DIAGNOSIS — M8589 Other specified disorders of bone density and structure, multiple sites: Secondary | ICD-10-CM

## 2024-07-09 DIAGNOSIS — Z803 Family history of malignant neoplasm of breast: Secondary | ICD-10-CM | POA: Insufficient documentation

## 2024-07-09 DIAGNOSIS — T451X5A Adverse effect of antineoplastic and immunosuppressive drugs, initial encounter: Secondary | ICD-10-CM

## 2024-07-09 DIAGNOSIS — Z79899 Other long term (current) drug therapy: Secondary | ICD-10-CM | POA: Diagnosis not present

## 2024-07-09 LAB — CBC WITH DIFFERENTIAL (CANCER CENTER ONLY)
Abs Immature Granulocytes: 0 K/uL (ref 0.00–0.07)
Basophils Absolute: 0 K/uL (ref 0.0–0.1)
Basophils Relative: 0 %
Eosinophils Absolute: 0 K/uL (ref 0.0–0.5)
Eosinophils Relative: 0 %
HCT: 39 % (ref 36.0–46.0)
Hemoglobin: 12.5 g/dL (ref 12.0–15.0)
Immature Granulocytes: 0 %
Lymphocytes Relative: 50 %
Lymphs Abs: 1.2 K/uL (ref 0.7–4.0)
MCH: 29.6 pg (ref 26.0–34.0)
MCHC: 32.1 g/dL (ref 30.0–36.0)
MCV: 92.2 fL (ref 80.0–100.0)
Monocytes Absolute: 0.3 K/uL (ref 0.1–1.0)
Monocytes Relative: 11 %
Neutro Abs: 1 K/uL — ABNORMAL LOW (ref 1.7–7.7)
Neutrophils Relative %: 39 %
Platelet Count: 177 K/uL (ref 150–400)
RBC: 4.23 MIL/uL (ref 3.87–5.11)
RDW: 12.3 % (ref 11.5–15.5)
Smear Review: NORMAL
WBC Count: 2.5 K/uL — ABNORMAL LOW (ref 4.0–10.5)
nRBC: 0 % (ref 0.0–0.2)

## 2024-07-09 LAB — CMP (CANCER CENTER ONLY)
ALT: 27 U/L (ref 0–44)
AST: 34 U/L (ref 15–41)
Albumin: 4.8 g/dL (ref 3.5–5.0)
Alkaline Phosphatase: 49 U/L (ref 38–126)
Anion gap: 7 (ref 5–15)
BUN: 21 mg/dL — ABNORMAL HIGH (ref 6–20)
CO2: 27 mmol/L (ref 22–32)
Calcium: 10.3 mg/dL (ref 8.9–10.3)
Chloride: 104 mmol/L (ref 98–111)
Creatinine: 0.93 mg/dL (ref 0.44–1.00)
GFR, Estimated: >60 mL/min (ref >60)
Glucose, Bld: 90 mg/dL (ref 70–99)
Potassium: 4.1 mmol/L (ref 3.5–5.1)
Sodium: 138 mmol/L (ref 135–145)
Total Bilirubin: 0.6 mg/dL (ref 0.0–1.2)
Total Protein: 8 g/dL (ref 6.5–8.1)

## 2024-07-09 MED ORDER — DENOSUMAB 60 MG/ML ~~LOC~~ SOSY
60.0000 mg | PREFILLED_SYRINGE | Freq: Once | SUBCUTANEOUS | Status: AC
  Administered 2024-07-09: 60 mg via SUBCUTANEOUS
  Filled 2024-07-09: qty 1

## 2024-07-09 NOTE — Assessment & Plan Note (Signed)
 Continue gabapentin 600 mg twice daily. 

## 2024-07-09 NOTE — Assessment & Plan Note (Addendum)
 Continue calcium and vitamin D  supplementation.  Hives/allergic reaction after  zometa . Switched to prolia  60mg  Q6 months.   Prolia  Q6 months-  next due Jan 2026

## 2024-07-09 NOTE — Assessment & Plan Note (Signed)
Continue calcium supplementation and Vitamin D supplementation.

## 2024-07-09 NOTE — Assessment & Plan Note (Addendum)
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  -s/p neoadjuvant pembrolizumab /carboplatin  Q3 weeks / weekly Taxol - followed by ddAC x4. -s/p left upper outer lumpectomy with sentinel lymph node biopsy  ypTis ypN0.    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

## 2024-07-09 NOTE — Progress Notes (Signed)
 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  -s/p neoadjuvant pembrolizumab /carboplatin  Q3 weeks / weekly Taxol - followed by ddAC x4. -s/p left upper outer lumpectomy with sentinel lymph node biopsy  ypTis ypN0.    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   Chemotherapy-induced neuropathy (HCC) Continue gabapentin 600 mg twice daily.   Osteopenia Continue calcium and vitamin D  supplementation.  Hives/allergic reaction after  zometa . Switched to prolia  60mg  Q6 months.   Prolia  Q6 months-  next due Jan 2026   Orders Placed This Encounter  Procedures   DG Bone Density    Standing Status:   Future    Expected Date:   12/09/2024    Expiration Date:   07/09/2025    Reason for Exam (SYMPTOM  OR DIAGNOSIS REQUIRED):   hx breast cancer    Is patient pregnant?:   No    Preferred imaging location?:   [facility]   Cancer antigen 15-3    Standing Status:   Future    Expected Date:   01/09/2025    Expiration Date:   04/09/2025   Cancer antigen 27.29    Standing Status:   Future    Expected Date:   01/09/2025    Expiration Date:   04/09/2025    CBC with Differential (Cancer Center Only)    Standing Status:   Future    Expected Date:   01/09/2025    Expiration Date:   04/09/2025   CMP (Cancer Center only)    Standing Status:   Future    Expected Date:   01/09/2025    Expiration Date:   04/09/2025   Follow-up in 3 months, lab  MD.   All questions were answered. The patient knows to call the clinic with any problems, questions or concerns.  Call Babara, MD, [name] [facility] Hematology Oncology 07/09/2024     HISTORY OF PRESENTING ILLNESS:   [place] is a  58 y.o.  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 Oncology history summary listed as below Oncology History Overview Note  # Pathology dated April 07, 2021 from [facility] in [place] :  Ultrasound-guided core biopsy. Nuclear grade 3, high-grade. P53: 70% staining. Ki-67: 70% staining.  ER negative, PR negative, HER2 negative. Mitotic rate of 13 mitoses per high-power field.  The patient brought a copy of her imaging studies with her from [place] and these have been independently reviewed.  Screening mammogram dated March 30, 2021 showed an asymmetric nodular density in the superior lateral aspect of the left breast. The right breast was unremarkable BI-RADS-0.  Ultrasound examination of the left breast showed a 1.1 cm round, nonparallel hypoechoic mass with angular margins. BI-RADS-4.  Family history  of breast cancer and a sister in her late 74s, early 49s. No history of genetic testing available at this time.   Breast cancer in female [facility])  04/21/2021 Initial Diagnosis   Left breast triple negative cancer, residual ER positive DCIS  -03/30/2021, screening mammogram with 3D 2 nodular areas of asymmetric density demonstrated within the superior lateral aspect of the left breast middle third depth.  Finding was best appreciated on 3D tomosynthesis imaging. Targeted ultrasound showed left upper outer breast  1:30 position 1.1 cm round hypoechoic mass with angular margins. 04/21/2021  biopsy of the breast mass Pathology showed infiltrating ductal carcinoma, grade 3, Ki-67 70%, ER negative, PR negative, HER2 negative,    Case was discussed on Tumor board.  Pathology reports and mammogram/ultrasound were done at outside facility and results were reviewed and discussed with patient and her husband.  LN status was not mentioned on her US . Recommend additional images with mammogram and MRI, neoadjuvant chemotherapy    05/12/2021 diagnostic mammogram of left breast showed 2.1 cm biopsy-proven malignancy in the lateral left breast at 2:00, 4 cm from nipple.  Directly adjacent cystic mass, 1.5 cm in size, consistent with biopsy hematoma.  1 abnormal and 1 borderline abnormal left axillary lymph node  05/18/2021 bilateral breast MRI with and without contrast showed 2.5 x 3 x 2.5 cm hematoma containing biopsy clip artifact within the upper outer left breast.  1.6 cm nodular non-mass-like enhancement 2.5 cm posterior/superior to the biopsy clip may represent biopsy-proven malignancy or additional areas of malignancy.  Single abnormal appearing left axillary lymph node with eccentric cortical thickening.  No MRI evidence of the right breast malignancy.   05/20/2021 Slide consultation of her left breast biopsy from outside institution - invasive mammary carcinoma, no special type, grade 3, DCIS and LVI not identified. - ER negative, PR negative, HER2 IHC negative. Ki 67 70-80%     05/20/2021 ultrasound-guided left axillary lymph node was negative for malignancy.  06/02/2021 MRI left breast biopsy of the upper outer quadrant - pathology shows high grade DCIS, ER 30%+    04/29/2021 Cancer Staging   Staging form: Breast, AJCC 8th Edition - Clinical stage from 04/29/2021: Stage IIB (cT2, cN0, cM0, G3, ER-, PR-, HER2-) - Signed by Babara Call, MD on 06/04/2021 Stage prefix: Initial diagnosis Histologic grading system: 3 grade  system   05/07/2021 Procedure   Mediport was placed by Dr. Dessa   05/25/2021 Genetic Testing   Negative genetic testing. No pathogenic variants identified on the [facility] Multi-Cancer Panel +RNA. The report date is 05/25/2021.  The Multi-Cancer Panel + RNA offered by [facility] includes sequencing and/or deletion duplication testing of the following 84 genes: AIP, ALK, APC, ATM, AXIN2,BAP1,  BARD1, BLM, BMPR1A, BRCA1, BRCA2, BRIP1, CASR, CDC73, CDH1, CDK4, CDKN1B, CDKN1C, CDKN2A (p14ARF), CDKN2A (p16INK4a), CEBPA, CHEK2, CTNNA1, DICER1, DIS3L2, EGFR (c.2369C>T, p.Thr790Met variant only), EPCAM (Deletion/duplication testing only), FH, FLCN, GATA2, GPC3, GREM1 (Promoter region deletion/duplication testing only), HOXB13 (c.251G>A, p.Gly84Glu), HRAS, KIT, MAX, MEN1, MET, MITF (c.952G>A, p.Glu318Lys variant only), MLH1, MSH2, MSH3, MSH6, MUTYH, NBN, NF1, NF2, NTHL1, PALB2, PDGFRA, PHOX2B, PMS2, POLD1, POLE, POT1, PRKAR1A, PTCH1, PTEN, RAD50, RAD51C, RAD51D, RB1, RECQL4, RET, RUNX1, SDHAF2, SDHA (sequence changes only), SDHB, SDHC, SDHD, SMAD4, SMARCA4, SMARCB1, SMARCE1, STK11, SUFU, TERC, TERT, TMEM127, TP53, TSC1, TSC2, VHL, WRN and WT1.   06/08/2021 - 10/28/2021 Chemotherapy   pembrolizumab /carboplatin  Q 3 weeks / weekly Taxol  followed by ddAC x4 with G-CSF support       06/08/2021 - 10/28/2021 Chemotherapy  Patient is on Treatment Plan : BREAST Pembrolizumab  + Carboplatin  weekly + Paclitaxel  D1,8,15 q21d X 4 cycles /  AC q21d x 4 cycles     08/30/2021 Imaging   MRI breast showed no abnormal enhancement remains in the left breast. Complete response to chemotherapy is identified. No MRI evidence of malignancy in either breast.   11/15/2021 Imaging   bilateral MRI breast showed no abnormal enhancement is identified in bilateral breast.   12/10/2021 Surgery   patient underwent left upper outer lumpectomy with sentinel lymph node biopsy.  Residual DCIS, high-grade, 4 sentinel lymph nodes were harvested  and all negative for malignancy.  Margins are negative for DCIS, closest margin is greater than 5 mm. ypTis ypN0. Residual DCIS ER 11-50%   01/04/2022 - 02/10/2022 Radiation Therapy   adjuvant breast radiation   03/11/2022 -  Anti-estrogen oral therapy   Started on letrozole  2.5 mg daily   05/12/2022 Mammogram   Bilateral diagnostic mammogram/ultrasound showed No mammographic evidence of malignancy   05/12/2022 Imaging   Bone density showed osteopenia FRAX* RESULTS:  10-year Probability of Fracture1 Major Osteoporotic Fracture2 Hip Fracture 2.2% 0.1%   02/13/2024 Imaging   PET scan showed  1. No findings for metastatic disease involving the neck, chest, abdomen/pelvis or bony structures. 2. Linear band of probable scar tissue involving the medial aspect of the right breast. This demonstrates mild hypermetabolism extending to the skin. Recommend correlation with recent mammograms and ultrasound.     02/22/2024 Mammogram   Bilateral diagnostic mammogram showed  1. No mammographic evidence of malignancy bilaterally. 2. PET CT findings correspond to postsurgical changes status post port removal.      INTERVAL HISTORY [place] is a 58 y.o. female who has above history reviewed by me today presents for follow up visit for management of left triple negative breast cancer  Patient has been on letrozole  2.5 mg daily.   + arthralgia and stiffness, hot flash.   . Review of Systems  Constitutional:  Positive for fatigue. Negative for appetite change, chills and fever.  HENT:   Negative for hearing loss and voice change.   Eyes:  Negative for eye problems.  Respiratory:  Negative for chest tightness and cough.   Cardiovascular:  Negative for chest pain.  Gastrointestinal:  Negative for abdominal distention, abdominal pain and blood in stool.  Endocrine: Positive for hot flashes.  Genitourinary:  Negative for difficulty urinating and frequency.   Musculoskeletal:  Positive  for arthralgias.  Skin:  Negative for itching and rash.  Neurological:  Positive for numbness. Negative for extremity weakness.  Hematological:  Negative for adenopathy.  Psychiatric/Behavioral:  Negative for confusion.     MEDICAL HISTORY:  Past Medical History:  Diagnosis Date   Arthritis    knees   Breast cancer (HCC)    2022   COVID-19 12/2020   Depression    Family history of breast cancer    Goiter    Malignant neoplasm of left female breast (HCC) 04/07/2021   Personal history of chemotherapy    Personal history of radiation therapy     SURGICAL HISTORY: Past Surgical History:  Procedure Laterality Date   ADJACENT TISSUE TRANSFER/TISSUE REARRANGEMENT Left 12/10/2021   Procedure: ADJACENT TISSUE TRANSFER/TISSUE REARRANGEMENT;  Surgeon: Dessa Reyes ORN, MD;  Location: [facility] ORS;  Service: General;  Laterality: Left;   AXILLARY SENTINEL NODE BIOPSY Left 12/10/2021   Procedure: AXILLARY SENTINEL NODE BIOPSY;  Surgeon: Dessa Reyes ORN, MD;  Location: [facility] ORS;  Service: General;  Laterality: Left;   BREAST BIOPSY Left 04/07/2021   [facility] in [place] :u/s bx triple neg   BREAST BIOPSY Left 05/20/2021   US  Axilla Bx, hydro 3 marker, neg   BREAST LUMPECTOMY WITH NEEDLE LOCALIZATION Left 12/10/2021   Procedure: BREAST LUMPECTOMY WITH NEEDLE LOCALIZATION, PECTORAL BLOCK;  Surgeon: Dessa Reyes ORN, MD;  Location: [facility] ORS;  Service: General;  Laterality: Left;   INCISION AND DRAINAGE ABSCESS Left 09/16/2022   Procedure: INCISION AND DRAINAGE ABSCESS;  Surgeon: Dessa Reyes ORN, MD;  Location: [facility] OR[facility];  Service: General;  Laterality: Left;  seroma (not abscess)   KNEE ARTHROSCOPY Right 2008   PORTACATH PLACEMENT Right 05/07/2021   Procedure: INSERTION PORT-A-CATH;  Surgeon: Dessa Reyes ORN, MD;  Location: [facility] OR[facility];  Service: General;  Laterality: Right;   TOOTH EXTRACTION     TRANSCERVICAL UTERINE FIBROID(S) ABLATION  2010    SOCIAL HISTORY: Social  History   Socioeconomic History   Marital status: Married    Spouse name: Not on file   Number of children: Not on file   Years of education: Not on file   Highest education level: Not on file  Occupational History   Not on file  Tobacco Use   Smoking status: Never   Smokeless tobacco: Never  Vaping Use   Vaping status: Never Used  Substance and Sexual Activity   Alcohol use: Never   Drug use: Never   Sexual activity: Yes  Other Topics Concern   Not on file  Social History Narrative   ** Merged History Encounter **       Social Drivers of Corporate investment banker Strain: Not on file  Food Insecurity: Not on file  Transportation Needs: Not on file  Physical Activity: Not on file  Stress: Not on file  Social Connections: Not on file  Intimate Partner Violence: Not on file    FAMILY HISTORY: Family History  Problem Relation Age of Onset   Breast cancer Paternal Grandmother    Breast cancer Sister        dx 30s, recurrence x3   Diabetes Sister    Diabetes Father    Throat cancer Maternal Grandmother     ALLERGIES:  is allergic to other.  MEDICATIONS:  Current Outpatient Medications  Medication Sig Dispense Refill   acetaminophen  (TYLENOL ) 650 MG CR tablet Take 1,300 mg by mouth See admin instructions. Take 1300 mg in the morning may take a second 1300 mg dose during the day as needed for pain     calcium carbonate (OS-CAL) 600 MG TABS tablet Take 2 tablets by mouth daily with breakfast.     celecoxib (CELEBREX) 100 MG capsule Take 100 mg by mouth 2 (two) times daily.     cholecalciferol (VITAMIN D) 25 MCG (1000 UNIT) tablet Take 1,000 Units by mouth daily.     cyclobenzaprine (FLEXERIL) 10 MG tablet 2 (two) times daily as needed     diclofenac Sodium (VOLTAREN) 1 % GEL Apply 1 application  topically daily as needed.     gabapentin (NEURONTIN) 600 MG tablet Take 600 mg by mouth every morning.     HYDROcodone -acetaminophen  (NORCO/VICODIN) 5-325 MG tablet       letrozole  (FEMARA ) 2.5 MG tablet TAKE 1 TABLET(2.5 MG) BY MOUTH DAILY 30 tablet 6   lidocaine -prilocaine  (EMLA ) cream Apply 1 Application topically as needed. Apply small amount of cream over port a cath site 1-2 hours prior to port being accessed. 30 g 2   Multiple  Vitamins-Minerals (MULTIVITAMIN ADULT) CHEW Chew by mouth.     WEGOVY 0.25 MG/0.5ML SOAJ      fluticasone (FLONASE) 50 MCG/ACT nasal spray Place 1 spray into both nostrils daily.     phentermine (ADIPEX-P) 37.5 MG tablet Take 18.75 mg by mouth daily.     No current facility-administered medications for this visit.   Facility-Administered Medications Ordered in Other Visits  Medication Dose Route Frequency Provider Last Rate Last Admin   heparin  lock flush 100 unit/mL  500 Units Intravenous Once Babara Call, MD       heparin  lock flush 100 unit/mL  500 Units Intravenous Once Babara Call, MD       sodium chloride  flush (NS) 0.9 % injection 10 mL  10 mL Intravenous Once Babara Call, MD       sodium chloride  flush (NS) 0.9 % injection 10 mL  10 mL Intravenous PRN Babara Call, MD         PHYSICAL EXAMINATION: ECOG PERFORMANCE STATUS: 0 - Asymptomatic Vitals:   07/09/24 1001  BP: 118/78  Pulse: 89  Resp: 15  Temp: (!) 97.3 F (36.3 C)  SpO2: 100%   Filed Weights   07/09/24 1001  Weight: 201 lb (91.2 kg)    Physical Exam Constitutional:      General: She is not in acute distress. HENT:     Head: Normocephalic and atraumatic.  Eyes:     General: No scleral icterus. Cardiovascular:     Rate and Rhythm: Normal rate and regular rhythm.     Heart sounds: Normal heart sounds.  Pulmonary:     Effort: Pulmonary effort is normal. No respiratory distress.     Breath sounds: No wheezing.  Abdominal:     General: Bowel sounds are normal. There is no distension.     Palpations: Abdomen is soft.  Musculoskeletal:        General: No deformity. Normal range of motion.     Cervical back: Normal range of motion and neck supple.  Skin:     General: Skin is warm and dry.     Findings: No erythema or rash.  Neurological:     Mental Status: She is alert and oriented to person, place, and time. Mental status is at baseline.     Cranial Nerves: No cranial nerve deficit.     Coordination: Coordination normal.  Psychiatric:        Mood and Affect: Mood normal.    Breast exam was performed in seated and lying down position. Patient is status post left lumpectomy with a well-healed surgical scar.   No palpable breast masses bilaterally.  No palpable axillary adenopathy bilaterally.   LABORATORY DATA:  I have reviewed the data as listed    Latest Ref Rng & Units 07/09/2024    9:51 AM 04/09/2024   10:01 AM 01/09/2024    9:37 AM  CBC  WBC 4.0 - 10.5 K/uL 2.5  2.8  4.0   Hemoglobin 12.0 - 15.0 g/dL 87.4  88.3  87.7   Hematocrit 36.0 - 46.0 % 39.0  36.4  38.3   Platelets 150 - 400 K/uL 177  181  222       Latest Ref Rng & Units 07/09/2024    9:52 AM 04/09/2024   10:01 AM 01/09/2024    9:37 AM  CMP  Glucose 70 - 99 mg/dL 90  98  97   BUN 6 - 20 mg/dL 21  17  25    Creatinine 0.44 -  1.00 mg/dL 9.06  9.30  9.14   Sodium 135 - 145 mmol/L 138  140  138   Potassium 3.5 - 5.1 mmol/L 4.1  4.0  4.4   Chloride 98 - 111 mmol/L 104  106  102   CO2 22 - 32 mmol/L 27  24  28    Calcium 8.9 - 10.3 mg/dL 89.6  9.9  89.1   Total Protein 6.5 - 8.1 g/dL 8.0  7.6  8.3   Total Bilirubin 0.0 - 1.2 mg/dL 0.6  0.7  0.5   Alkaline Phos 38 - 126 U/L 49  38  63   AST 15 - 41 U/L 34  26  27   ALT 0 - 44 U/L 27  26  40     RADIOGRAPHIC STUDIES: I have personally reviewed the radiological images as listed and agreed with the findings in the report. No results found.

## 2024-07-10 LAB — CANCER ANTIGEN 27.29: CA 27.29: <9 U/mL (ref 0.0–38.6)

## 2024-07-10 LAB — CANCER ANTIGEN 15-3: CA 15-3: 5.8 U/mL (ref 0.0–25.0)

## 2024-07-23 ENCOUNTER — Other Ambulatory Visit: Payer: Self-pay

## 2024-07-23 MED ORDER — LETROZOLE 2.5 MG PO TABS: 2.5000 mg | ORAL_TABLET | Freq: Every day | ORAL | 6 refills | Status: DC

## 2024-09-23 ENCOUNTER — Other Ambulatory Visit: Payer: Self-pay

## 2024-09-23 MED ORDER — LETROZOLE 2.5 MG PO TABS: 2.5000 mg | ORAL_TABLET | Freq: Every day | ORAL | 6 refills | Status: DC

## 2024-11-27 ENCOUNTER — Ambulatory Visit
Admission: RE | Admit: 2024-11-27 | Discharge: 2024-11-27 | Disposition: A | Discharge status: Home / Self Care | Source: Ambulatory Visit | Attending: Oncology | Admitting: Oncology

## 2024-11-27 DIAGNOSIS — Z171 Estrogen receptor negative status [ER-]: Secondary | ICD-10-CM | POA: Insufficient documentation

## 2024-11-27 DIAGNOSIS — C50812 Malignant neoplasm of overlapping sites of left female breast: Secondary | ICD-10-CM | POA: Insufficient documentation

## 2025-01-09 ENCOUNTER — Inpatient Hospital Stay (HOSPITAL_BASED_OUTPATIENT_CLINIC_OR_DEPARTMENT_OTHER): Admitting: Oncology

## 2025-01-09 ENCOUNTER — Inpatient Hospital Stay

## 2025-01-09 ENCOUNTER — Inpatient Hospital Stay: Attending: Oncology

## 2025-01-09 ENCOUNTER — Other Ambulatory Visit: Payer: Self-pay | Admitting: Oncology

## 2025-01-09 ENCOUNTER — Encounter: Payer: Self-pay | Admitting: Oncology

## 2025-01-09 VITALS — BP 124/74 | HR 85 | Temp 96.9°F | Resp 18 | Wt 197.9 lb

## 2025-01-09 DIAGNOSIS — Z79811 Long term (current) use of aromatase inhibitors: Secondary | ICD-10-CM | POA: Insufficient documentation

## 2025-01-09 DIAGNOSIS — N951 Menopausal and female climacteric states: Secondary | ICD-10-CM | POA: Diagnosis not present

## 2025-01-09 DIAGNOSIS — C50412 Malignant neoplasm of upper-outer quadrant of left female breast: Secondary | ICD-10-CM | POA: Insufficient documentation

## 2025-01-09 DIAGNOSIS — Z1732 Human epidermal growth factor receptor 2 negative status: Secondary | ICD-10-CM | POA: Insufficient documentation

## 2025-01-09 DIAGNOSIS — T451X5A Adverse effect of antineoplastic and immunosuppressive drugs, initial encounter: Secondary | ICD-10-CM | POA: Diagnosis not present

## 2025-01-09 DIAGNOSIS — T451X5D Adverse effect of antineoplastic and immunosuppressive drugs, subsequent encounter: Secondary | ICD-10-CM | POA: Insufficient documentation

## 2025-01-09 DIAGNOSIS — C50812 Malignant neoplasm of overlapping sites of left female breast: Secondary | ICD-10-CM | POA: Diagnosis not present

## 2025-01-09 DIAGNOSIS — Z923 Personal history of irradiation: Secondary | ICD-10-CM | POA: Diagnosis not present

## 2025-01-09 DIAGNOSIS — Z79899 Other long term (current) drug therapy: Secondary | ICD-10-CM | POA: Insufficient documentation

## 2025-01-09 DIAGNOSIS — G620 Drug-induced polyneuropathy: Secondary | ICD-10-CM | POA: Diagnosis not present

## 2025-01-09 DIAGNOSIS — M8580 Other specified disorders of bone density and structure, unspecified site: Secondary | ICD-10-CM | POA: Diagnosis present

## 2025-01-09 DIAGNOSIS — Z171 Estrogen receptor negative status [ER-]: Secondary | ICD-10-CM

## 2025-01-09 DIAGNOSIS — Z800 Family history of malignant neoplasm of digestive organs: Secondary | ICD-10-CM | POA: Diagnosis not present

## 2025-01-09 DIAGNOSIS — Z803 Family history of malignant neoplasm of breast: Secondary | ICD-10-CM | POA: Diagnosis not present

## 2025-01-09 DIAGNOSIS — R948 Abnormal results of function studies of other organs and systems: Secondary | ICD-10-CM

## 2025-01-09 DIAGNOSIS — Z1722 Progesterone receptor negative status: Secondary | ICD-10-CM | POA: Insufficient documentation

## 2025-01-09 DIAGNOSIS — M8589 Other specified disorders of bone density and structure, multiple sites: Secondary | ICD-10-CM

## 2025-01-09 LAB — CBC WITH DIFFERENTIAL (CANCER CENTER ONLY)
Abs Immature Granulocytes: 0 K/uL (ref 0.00–0.07)
Basophils Absolute: 0 K/uL (ref 0.0–0.1)
Basophils Relative: 0 %
Eosinophils Absolute: 0 K/uL (ref 0.0–0.5)
Eosinophils Relative: 1 %
HCT: 36.4 % (ref 36.0–46.0)
Hemoglobin: 11.7 g/dL — ABNORMAL LOW (ref 12.0–15.0)
Immature Granulocytes: 0 %
Lymphocytes Relative: 40 %
Lymphs Abs: 1.1 K/uL (ref 0.7–4.0)
MCH: 30 pg (ref 26.0–34.0)
MCHC: 32.1 g/dL (ref 30.0–36.0)
MCV: 93.3 fL (ref 80.0–100.0)
Monocytes Absolute: 0.3 K/uL (ref 0.1–1.0)
Monocytes Relative: 11 %
Neutro Abs: 1.4 K/uL — ABNORMAL LOW (ref 1.7–7.7)
Neutrophils Relative %: 48 %
Platelet Count: 192 K/uL (ref 150–400)
RBC: 3.9 MIL/uL (ref 3.87–5.11)
RDW: 13.2 % (ref 11.5–15.5)
WBC Count: 2.8 K/uL — ABNORMAL LOW (ref 4.0–10.5)
nRBC: 0 % (ref 0.0–0.2)

## 2025-01-09 LAB — CMP (CANCER CENTER ONLY)
ALT: 37 U/L (ref 0–44)
AST: 38 U/L (ref 15–41)
Albumin: 4.8 g/dL (ref 3.5–5.0)
Alkaline Phosphatase: 58 U/L (ref 38–126)
Anion gap: 9 (ref 5–15)
BUN: 18 mg/dL (ref 6–20)
CO2: 27 mmol/L (ref 22–32)
Calcium: 10.5 mg/dL — ABNORMAL HIGH (ref 8.9–10.3)
Chloride: 106 mmol/L (ref 98–111)
Creatinine: 0.71 mg/dL (ref 0.44–1.00)
GFR, Estimated: >60 mL/min
Glucose, Bld: 80 mg/dL (ref 70–99)
Potassium: 4.4 mmol/L (ref 3.5–5.1)
Sodium: 142 mmol/L (ref 135–145)
Total Bilirubin: 0.5 mg/dL (ref 0.0–1.2)
Total Protein: 7.9 g/dL (ref 6.5–8.1)

## 2025-01-09 LAB — VITAMIN D 25 HYDROXY (VIT D DEFICIENCY, FRACTURES): Vit D, 25-Hydroxy: 98.7 ng/mL (ref 30–100)

## 2025-01-09 MED ORDER — VENLAFAXINE HCL 37.5 MG PO TABS: 37.5000 mg | ORAL_TABLET | Freq: Every day | ORAL | 0 refills | Status: AC

## 2025-01-09 MED ORDER — LETROZOLE 2.5 MG PO TABS: 2.5000 mg | ORAL_TABLET | Freq: Every day | ORAL | 6 refills | Status: AC

## 2025-01-09 MED ORDER — DENOSUMAB 60 MG/ML ~~LOC~~ SOSY
60.0000 mg | PREFILLED_SYRINGE | Freq: Once | SUBCUTANEOUS | Status: AC
  Administered 2025-01-09: 60 mg via SUBCUTANEOUS
  Filled 2025-01-09: qty 1

## 2025-01-09 NOTE — Assessment & Plan Note (Addendum)
 Trial of Effexor  37.5mg  daily.  Rationale and side effects were reviewed with patient.

## 2025-01-09 NOTE — Assessment & Plan Note (Addendum)
 Continue calcium and vitamin D   supplementation.  11/27/2024 DEXA osteopenia.  Hives/allergic reaction after  zometa . Switched to prolia  60mg  Q6 months.   Prolia  Q6 months-  today and next due July 2026

## 2025-01-09 NOTE — Progress Notes (Signed)
 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  -s/p neoadjuvant pembrolizumab /carboplatin  Q3 weeks / weekly Taxol - followed by ddAC x4. -s/p left upper outer lumpectomy with sentinel lymph node biopsy  ypTis ypN0.    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   Chemotherapy-induced neuropathy Continue gabapentin 600 mg twice daily.   Osteopenia Continue calcium and vitamin D   supplementation.  11/27/2024 DEXA osteopenia.  Hives/allergic reaction after  zometa . Switched to prolia  60mg  Q6 months.   Prolia  Q6 months-  today and next due July 2026  Hypercalcemia Previously PTH was decreased. ? Side effect from [place] Repeat PTH, PTHrp, Vitamin D  level  Vasomotor symptoms due to menopause Trial of Effexor  37.5mg  daily.  Rationale and side effects were reviewed with patient.     Orders Placed This Encounter  Procedures   MM 3D DIAGNOSTIC MAMMOGRAM BILATERAL BREAST    Standing Status:   Future    Expected Date:   02/24/2025    Expiration Date:   01/09/2026    Reason for Exam (SYMPTOM  OR DIAGNOSIS REQUIRED):   hx breast cancer    Is the patient  pregnant?:   No    Preferred imaging location?:   [facility]   Parathyroid  hormone, intact (no Ca)    Standing Status:   Future    Number of Occurrences:   1    Expected Date:   01/09/2025    Expiration Date:   04/09/2025   PTH-related peptide    Standing Status:   Future    Number of Occurrences:   1    Expected Date:   01/09/2025    Expiration Date:   04/09/2025   VITAMIN D  25 Hydroxy (Vit-D Deficiency, Fractures)    Standing Status:   Future    Number of Occurrences:   1    Expected Date:   01/09/2025    Expiration Date:   04/09/2025   CBC with Differential (Cancer Center Only)    Standing Status:   Future    Expected Date:   04/09/2025    Expiration Date:   01/09/2026   CMP (Cancer Center only)    Standing Status:   Future    Expected Date:   04/09/2025    Expiration Date:   01/09/2026   Cancer antigen 15-3    Standing Status:   Future    Expected Date:   04/09/2025    Expiration Date:   01/09/2026   Cancer antigen 27.29    Standing Status:   Future    Expected Date:   04/09/2025    Expiration Date:   01/09/2026   Follow-up in 3 months, lab  Peggy Bates.   All questions were answered. The patient knows to call the clinic with  any problems, questions or concerns.  Peggy Cap, Peggy Bates, Peggy Bates [facility] Hematology Oncology 01/09/2025     HISTORY OF PRESENTING ILLNESS:   Peggy Bates is a  59 y.o.  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 Oncology history summary listed as below Oncology History Overview Note  # Pathology dated April 07, 2021 from [facility] in [place] :  Ultrasound-guided core biopsy. Nuclear grade 3, high-grade. P53: 70% staining. Ki-67: 70% staining.  ER negative, PR negative, HER2 negative. Mitotic rate of 13 mitoses per high-power field.  The patient brought a copy of her imaging studies with her from [place] and these have been independently reviewed.  Screening mammogram dated March 30, 2021  showed an asymmetric nodular density in the superior lateral aspect of the left breast. The right breast was unremarkable BI-RADS-0.  Ultrasound examination of the left breast showed a 1.1 cm round, nonparallel hypoechoic mass with angular margins. BI-RADS-4.  Family history of breast cancer and a sister in her late 9s, early 11s. No history of genetic testing available at this time.   Breast cancer in female [facility])  04/21/2021 Initial Diagnosis   Left breast triple negative cancer, residual ER positive DCIS  -03/30/2021, screening mammogram with 3D 2 nodular areas of asymmetric density demonstrated within the superior lateral aspect of the left breast middle third depth.  Finding was best appreciated on 3D tomosynthesis imaging. Targeted ultrasound showed left upper outer breast 1:30 position 1.1 cm round hypoechoic mass with angular margins. 04/21/2021  biopsy of the breast mass Pathology showed infiltrating ductal carcinoma, grade 3, Ki-67 70%, ER negative, PR negative, HER2 negative,    Case was discussed on Tumor board.  Pathology reports and mammogram/ultrasound were done at outside facility and results were reviewed and discussed with patient and her husband.  LN status was not mentioned on her US . Recommend additional images with mammogram and MRI, neoadjuvant chemotherapy    05/12/2021 diagnostic mammogram of left breast showed 2.1 cm biopsy-proven malignancy in the lateral left breast at 2:00, 4 cm from nipple.  Directly adjacent cystic mass, 1.5 cm in size, consistent with biopsy hematoma.  1 abnormal and 1 borderline abnormal left axillary lymph node  05/18/2021 bilateral breast MRI with and without contrast showed 2.5 x 3 x 2.5 cm hematoma containing biopsy clip artifact within the upper outer left breast.  1.6 cm nodular non-mass-like enhancement 2.5 cm posterior/superior to the biopsy clip may represent biopsy-proven malignancy or additional areas of malignancy.  Single abnormal appearing  left axillary lymph node with eccentric cortical thickening.  No MRI evidence of the right breast malignancy.   05/20/2021 Slide consultation of her left breast biopsy from outside institution - invasive mammary carcinoma, no special type, grade 3, DCIS and LVI not identified. - ER negative, PR negative, HER2 IHC negative. Ki 67 70-80%     05/20/2021 ultrasound-guided left axillary lymph node was negative for malignancy.  06/02/2021 MRI left breast biopsy of the upper outer quadrant - pathology shows high grade DCIS, ER 30%+    04/29/2021 Cancer Staging   Staging form: Breast, AJCC 8th Edition - Clinical stage from 04/29/2021: Stage IIB (cT2, cN0, cM0, G3, ER-, PR-, HER2-) - Signed by Bates Zelphia, Peggy Bates on 06/04/2021 Stage prefix: Initial diagnosis Histologic grading system: 3 grade system   05/07/2021 Procedure   Mediport was placed by Dr. Dessa   05/25/2021 Genetic Testing   Negative genetic testing. No pathogenic variants identified on the [facility] Multi-Cancer  Panel +RNA. The report date is 05/25/2021.  The Multi-Cancer Panel + RNA offered by [facility] includes sequencing and/or deletion duplication testing of the following 84 genes: AIP, ALK, APC, ATM, AXIN2,BAP1,  BARD1, BLM, BMPR1A, BRCA1, BRCA2, BRIP1, CASR, CDC73, CDH1, CDK4, CDKN1B, CDKN1C, CDKN2A (p14ARF), CDKN2A (p16INK4a), CEBPA, CHEK2, CTNNA1, DICER1, DIS3L2, EGFR (c.2369C>T, p.Thr790Met variant only), EPCAM (Deletion/duplication testing only), FH, FLCN, GATA2, GPC3, GREM1 (Promoter region deletion/duplication testing only), HOXB13 (c.251G>A, p.Gly84Glu), HRAS, KIT, MAX, MEN1, MET, MITF (c.952G>A, p.Glu318Lys variant only), MLH1, MSH2, MSH3, MSH6, MUTYH, NBN, NF1, NF2, NTHL1, PALB2, PDGFRA, PHOX2B, PMS2, POLD1, POLE, POT1, PRKAR1A, PTCH1, PTEN, RAD50, RAD51C, RAD51D, RB1, RECQL4, RET, RUNX1, SDHAF2, SDHA (sequence changes only), SDHB, SDHC, SDHD, SMAD4, SMARCA4, SMARCB1, SMARCE1, STK11, SUFU, TERC, TERT, TMEM127, TP53, TSC1, TSC2, VHL, WRN and WT1.    06/08/2021 - 10/28/2021 Chemotherapy   pembrolizumab /carboplatin  Q 3 weeks / weekly Taxol  followed by ddAC x4 with G-CSF support       06/08/2021 - 10/28/2021 Chemotherapy   Patient is on Treatment Plan : BREAST Pembrolizumab  + Carboplatin  weekly + Paclitaxel  D1,8,15 q21d X 4 cycles /  AC q21d x 4 cycles     08/30/2021 Imaging   MRI breast showed no abnormal enhancement remains in the left breast. Complete response to chemotherapy is identified. No MRI evidence of malignancy in either breast.   11/15/2021 Imaging   bilateral MRI breast showed no abnormal enhancement is identified in bilateral breast.   12/10/2021 Surgery   patient underwent left upper outer lumpectomy with sentinel lymph node biopsy.  Residual DCIS, high-grade, 4 sentinel lymph nodes were harvested and all negative for malignancy.  Margins are negative for DCIS, closest margin is greater than 5 mm. ypTis ypN0. Residual DCIS ER 11-50%   01/04/2022 - 02/10/2022 Radiation Therapy   adjuvant breast radiation   03/11/2022 -  Anti-estrogen oral therapy   Started on letrozole  2.5 mg daily   05/12/2022 Mammogram   Bilateral diagnostic mammogram/ultrasound showed No mammographic evidence of malignancy   05/12/2022 Imaging   Bone density showed osteopenia FRAX* RESULTS:  10-year Probability of Fracture1 Major Osteoporotic Fracture2 Hip Fracture 2.2% 0.1%   02/13/2024 Imaging   PET scan showed  1. No findings for metastatic disease involving the neck, chest, abdomen/pelvis or bony structures. 2. Linear band of probable scar tissue involving the medial aspect of the right breast. This demonstrates mild hypermetabolism extending to the skin. Recommend correlation with recent mammograms and ultrasound.     02/22/2024 Mammogram   Bilateral diagnostic mammogram showed  1. No mammographic evidence of malignancy bilaterally. 2. PET CT findings correspond to postsurgical changes status post port removal.      INTERVAL  HISTORY Tyrah Broers is a 59 y.o. female who has above history reviewed by me today presents for follow up visit for management of left triple negative breast cancer  Patient has been on letrozole  2.5 mg daily.   + arthralgia and stiffness, hot flash. She experiences significant vasomotor symptoms, primarily nocturnal hot flushes triggered by movement during sleep, necessitating the use of a bedside fan. Daytime symptoms are less severe.  . Review of Systems  Constitutional:  Positive for fatigue. Negative for appetite change, chills and fever.  HENT:   Negative for hearing loss and voice change.   Eyes:  Negative for eye problems.  Respiratory:  Negative for chest tightness and cough.   Cardiovascular:  Negative for chest pain.  Gastrointestinal:  Negative for abdominal distention, abdominal pain and blood in stool.  Endocrine: Positive  for hot flashes.  Genitourinary:  Negative for difficulty urinating and frequency.   Musculoskeletal:  Positive for arthralgias.  Skin:  Negative for itching and rash.  Neurological:  Positive for numbness. Negative for extremity weakness.  Hematological:  Negative for adenopathy.  Psychiatric/Behavioral:  Negative for confusion.     MEDICAL HISTORY:  Past Medical History:  Diagnosis Date   Arthritis    knees   Breast cancer (HCC)    2022   COVID-19 12/2020   Depression    Family history of breast cancer    Goiter    Malignant neoplasm of left female breast (HCC) 04/07/2021   Personal history of chemotherapy    Personal history of radiation therapy     SURGICAL HISTORY: Past Surgical History:  Procedure Laterality Date   ADJACENT TISSUE TRANSFER/TISSUE REARRANGEMENT Left 12/10/2021   Procedure: ADJACENT TISSUE TRANSFER/TISSUE REARRANGEMENT;  Surgeon: Dessa Reyes ORN, Peggy Bates;  Location: [facility] ORS;  Service: General;  Laterality: Left;   AXILLARY SENTINEL NODE BIOPSY Left 12/10/2021   Procedure: AXILLARY SENTINEL NODE BIOPSY;  Surgeon:  Dessa Reyes ORN, Peggy Bates;  Location: [facility] ORS;  Service: General;  Laterality: Left;   BREAST BIOPSY Left 04/07/2021   [facility] in [place] :u/s bx triple neg   BREAST BIOPSY Left 05/20/2021   US  Axilla Bx, hydro 3 marker, neg   BREAST LUMPECTOMY WITH NEEDLE LOCALIZATION Left 12/10/2021   Procedure: BREAST LUMPECTOMY WITH NEEDLE LOCALIZATION, PECTORAL BLOCK;  Surgeon: Dessa Reyes ORN, Peggy Bates;  Location: [facility] ORS;  Service: General;  Laterality: Left;   INCISION AND DRAINAGE ABSCESS Left 09/16/2022   Procedure: INCISION AND DRAINAGE ABSCESS;  Surgeon: Dessa Reyes ORN, Peggy Bates;  Location: [facility] ORS;  Service: General;  Laterality: Left;  seroma (not abscess)   KNEE ARTHROSCOPY Right 2008   PORTACATH PLACEMENT Right 05/07/2021   Procedure: INSERTION PORT-A-CATH;  Surgeon: Dessa Reyes ORN, Peggy Bates;  Location: [facility] ORS;  Service: General;  Laterality: Right;   TOOTH EXTRACTION     TRANSCERVICAL UTERINE FIBROID(S) ABLATION  2010    SOCIAL HISTORY: Social History   Socioeconomic History   Marital status: Married    Spouse name: Not on file   Number of children: Not on file   Years of education: Not on file   Highest education level: Not on file  Occupational History   Not on file  Tobacco Use   Smoking status: Never   Smokeless tobacco: Never  Vaping Use   Vaping status: Never Used  Substance and Sexual Activity   Alcohol use: Never   Drug use: Never   Sexual activity: Yes  Other Topics Concern   Not on file  Social History Narrative   ** Merged History Encounter **       Social Drivers of Health   Tobacco Use: Low Risk (01/09/2025)   Patient History    Smoking Tobacco Use: Never    Smokeless Tobacco Use: Never    Passive Exposure: Not on file  Financial Resource Strain: Not on file  Food Insecurity: Not on file  Transportation Needs: Not on file  Physical Activity: Not on file  Stress: Not on file  Social Connections: Not on file  Intimate Partner Violence: Not on  file  Depression (PHQ2-9): Low Risk (07/09/2024)   Depression (PHQ2-9)    PHQ-2 Score: 0  Alcohol Screen: Not on file  Housing: Not on file  Utilities: Not on file  Health Literacy: Not on file    FAMILY HISTORY: Family History  Problem Relation Age  of Onset   Breast cancer Paternal Grandmother    Breast cancer Sister        dx 30s, recurrence x3   Diabetes Sister    Diabetes Father    Throat cancer Maternal Grandmother     ALLERGIES:  is allergic to other.  MEDICATIONS:  Current Outpatient Medications  Medication Sig Dispense Refill   calcium carbonate (OS-CAL) 600 MG TABS tablet Take 2 tablets by mouth daily with breakfast.     celecoxib (CELEBREX) 100 MG capsule Take 100 mg by mouth 2 (two) times daily.     cholecalciferol (VITAMIN D ) 25 MCG (1000 UNIT) tablet Take 1,000 Units by mouth daily.     cyclobenzaprine (FLEXERIL) 10 MG tablet 2 (two) times daily as needed     gabapentin (NEURONTIN) 600 MG tablet Take 600 mg by mouth every morning.     HYDROcodone -acetaminophen  (NORCO/VICODIN) 5-325 MG tablet      Multiple Vitamins-Minerals (MULTIVITAMIN ADULT) CHEW Chew by mouth.     venlafaxine  (EFFEXOR ) 37.5 MG tablet Take 1 tablet (37.5 mg total) by mouth daily. 30 tablet 0   WEGOVY 0.25 MG/0.5ML SOAJ      acetaminophen  (TYLENOL ) 650 MG CR tablet Take 1,300 mg by mouth See admin instructions. Take 1300 mg in the morning may take a second 1300 mg dose during the day as needed for pain (Patient not taking: Reported on 01/09/2025)     diclofenac Sodium (VOLTAREN) 1 % GEL Apply 1 application  topically daily as needed. (Patient not taking: Reported on 01/09/2025)     letrozole  (FEMARA ) 2.5 MG tablet Take 1 tablet (2.5 mg total) by mouth daily. 30 tablet 6   lidocaine -prilocaine  (EMLA ) cream Apply 1 Application topically as needed. Apply small amount of cream over port a cath site 1-2 hours prior to port being accessed. (Patient not taking: Reported on 01/09/2025) 30 g 2   No current  facility-administered medications for this visit.   Facility-Administered Medications Ordered in Other Visits  Medication Dose Route Frequency Provider Last Rate Last Admin   heparin  lock flush 100 unit/mL  500 Units Intravenous Once Babara Call, Peggy Bates       heparin  lock flush 100 unit/mL  500 Units Intravenous Once Babara Call, Peggy Bates       sodium chloride  flush (NS) 0.9 % injection 10 mL  10 mL Intravenous Once Babara Call, Peggy Bates       sodium chloride  flush (NS) 0.9 % injection 10 mL  10 mL Intravenous PRN Babara Call, Peggy Bates         PHYSICAL EXAMINATION: ECOG PERFORMANCE STATUS: 0 - Asymptomatic Vitals:   01/09/25 0947  BP: 124/74  Pulse: 85  Resp: 18  Temp: (!) 96.9 F (36.1 C)  SpO2: 100%   Filed Weights   01/09/25 0947  Weight: 197 lb 14.4 oz (89.8 kg)    Physical Exam Constitutional:      General: She is not in acute distress. HENT:     Head: Normocephalic and atraumatic.  Eyes:     General: No scleral icterus. Cardiovascular:     Rate and Rhythm: Normal rate and regular rhythm.     Heart sounds: Normal heart sounds.  Pulmonary:     Effort: Pulmonary effort is normal. No respiratory distress.     Breath sounds: No wheezing.  Abdominal:     General: Bowel sounds are normal. There is no distension.     Palpations: Abdomen is soft.  Musculoskeletal:        General:  No deformity. Normal range of motion.     Cervical back: Normal range of motion and neck supple.  Skin:    General: Skin is warm and dry.     Findings: No erythema or rash.  Neurological:     Mental Status: She is alert and oriented to person, place, and time. Mental status is at baseline.     Cranial Nerves: No cranial nerve deficit.     Coordination: Coordination normal.  Psychiatric:        Mood and Affect: Mood normal.    Breast exam was performed in seated and lying down position. Patient is status post left lumpectomy with a well-healed surgical scar.   No palpable breast masses bilaterally.  No palpable axillary  adenopathy bilaterally.   LABORATORY DATA:  I have reviewed the data as listed    Latest Ref Rng & Units 01/09/2025    9:37 AM 07/09/2024    9:51 AM 04/09/2024   10:01 AM  CBC  WBC 4.0 - 10.5 K/uL 2.8  2.5  2.8   Hemoglobin 12.0 - 15.0 g/dL 88.2  87.4  88.3   Hematocrit 36.0 - 46.0 % 36.4  39.0  36.4   Platelets 150 - 400 K/uL 192  177  181       Latest Ref Rng & Units 01/09/2025    9:37 AM 07/09/2024    9:52 AM 04/09/2024   10:01 AM  CMP  Glucose 70 - 99 mg/dL 80  90  98   BUN 6 - 20 mg/dL 18  21  17    Creatinine 0.44 - 1.00 mg/dL 9.28  9.06  9.30   Sodium 135 - 145 mmol/L 142  138  140   Potassium 3.5 - 5.1 mmol/L 4.4  4.1  4.0   Chloride 98 - 111 mmol/L 106  104  106   CO2 22 - 32 mmol/L 27  27  24    Calcium 8.9 - 10.3 mg/dL 89.4  89.6  9.9   Total Protein 6.5 - 8.1 g/dL 7.9  8.0  7.6   Total Bilirubin 0.0 - 1.2 mg/dL 0.5  0.6  0.7   Alkaline Phos 38 - 126 U/L 58  49  38   AST 15 - 41 U/L 38  34  26   ALT 0 - 44 U/L 37  27  26     RADIOGRAPHIC STUDIES: I have personally reviewed the radiological images as listed and agreed with the findings in the report. DG Bone Density Result Date: 11/27/2024 EXAM: DUAL X-RAY ABSORPTIOMETRY (DXA) FOR BONE MINERAL DENSITY 11/27/2024 10:37 am CLINICAL DATA:  59 year old Female Postmenopausal. History of breast cancer Patient is or has been on bone building therapies. TECHNIQUE: An axial (e.g., hips, spine) and/or appendicular (e.g., radius) exam was performed, as appropriate, using GE Secretary/administrator at [facility]. Images are obtained for bone mineral density measurement and are not obtained for diagnostic purposes. [id number] Exclusions: None. COMPARISON:  05/12/2022. FINDINGS: Scan quality: Good. LUMBAR SPINE (L1-L4): BMD (in g/cm2): 1.052 T-score: -1.2 Z-score: -0.9 Rate of change from previous exam: 12.0 % LEFT FEMORAL NECK: BMD (in g/cm2): 0.909 T-score: -0.9 Z-score: -0.8 LEFT TOTAL HIP: BMD (in g/cm2): 0.994  T-score: -0.1 Z-score: -0.4 RIGHT FEMORAL NECK: BMD (in g/cm2): 0.861 T-score: -1.3 Z-score: -1.1 RIGHT TOTAL HIP: BMD (in g/cm2): 0.925 T-score: -0.7 Z-score: -0.9 DUAL-FEMUR TOTAL MEAN: Rate of change from previous exam: 4.2 % LEFT FOREARM (RADIUS 33%): BMD (in g/cm2): 0.927  T-score: 0.6 Z-score: 0.5 Rate of change from previous exam: No significant rate of change from previous exam. FRAX 10-YEAR PROBABILITY OF FRACTURE: FRAX not reported as the patient is receiving bone building therapy. IMPRESSION: Osteopenia based on BMD. Fracture risk is unknown due to history of bone building therapy. RECOMMENDATIONS: 1. All patients should optimize calcium and vitamin D  intake. 2. Consider FDA-approved medical therapies in postmenopausal women and men aged 61 years and older, based on the following: - A hip or vertebral (clinical or morphometric) fracture - T-score less than or equal to -2.5 and secondary causes have been excluded. - Low bone mass (T-score between -1.0 and -2.5) and a 10-year probability of a hip fracture greater than or equal to 3% or a 10-year probability of a major osteoporosis-related fracture greater than or equal to 20% based on the [place] -adapted WHO algorithm. - Clinician judgment and/or patient preferences may indicate treatment for people with 10-year fracture probabilities above or below these levels 3. Patients with diagnosis of osteoporosis or at high risk for fracture should have regular bone mineral density tests. For patients eligible for Medicare, routine testing is allowed once every 2 years. The testing frequency can be increased to one year for patients who have rapidly progressing disease, those who are receiving or discontinuing medical therapy to restore bone mass, or have additional risk factors. Electronically Signed   By: Harrietta Sherry M.D.   On: 11/27/2024 10:50      "

## 2025-01-09 NOTE — Assessment & Plan Note (Signed)
 Previously PTH was decreased. ? Side effect from [place] Repeat PTH, PTHrp, Vitamin D  level

## 2025-01-09 NOTE — Assessment & Plan Note (Signed)
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  -s/p neoadjuvant pembrolizumab /carboplatin  Q3 weeks / weekly Taxol - followed by ddAC x4. -s/p left upper outer lumpectomy with sentinel lymph node biopsy  ypTis ypN0.    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

## 2025-01-09 NOTE — Assessment & Plan Note (Signed)
 Continue gabapentin 600 mg twice daily. 

## 2025-01-09 NOTE — Assessment & Plan Note (Signed)
Continue calcium supplementation and Vitamin D supplementation.

## 2025-01-10 LAB — CANCER ANTIGEN 15-3: CA 15-3: 5.5 U/mL (ref 0.0–25.0)

## 2025-01-10 LAB — PARATHYROID HORMONE, INTACT (NO CA): PTH: 32 pg/mL (ref 15–65)

## 2025-01-11 LAB — CANCER ANTIGEN 27.29: CA 27.29: 9.9 U/mL (ref 0.0–38.6)

## 2025-01-18 LAB — PTH-RELATED PEPTIDE: PTH-related peptide: <2 pmol/L

## 2025-02-24 ENCOUNTER — Encounter

## 2025-04-11 ENCOUNTER — Inpatient Hospital Stay: Admitting: Oncology

## 2025-04-11 ENCOUNTER — Inpatient Hospital Stay
